# Patient Record
Sex: Female | Born: 1950 | Race: White | Hispanic: No | Smoking: Never smoker
Health system: Southern US, Community
[De-identification: ages and names within clinical notes are randomized; demographics above are authoritative.]

## PROBLEM LIST (undated history)

## (undated) DIAGNOSIS — Z9889 Other specified postprocedural states: Secondary | ICD-10-CM

## (undated) DIAGNOSIS — K5730 Diverticulosis of large intestine without perforation or abscess without bleeding: Secondary | ICD-10-CM

## (undated) DIAGNOSIS — I10 Essential (primary) hypertension: Secondary | ICD-10-CM

## (undated) DIAGNOSIS — G709 Myoneural disorder, unspecified: Secondary | ICD-10-CM

## (undated) DIAGNOSIS — R112 Nausea with vomiting, unspecified: Secondary | ICD-10-CM

## (undated) DIAGNOSIS — C229 Malignant neoplasm of liver, not specified as primary or secondary: Secondary | ICD-10-CM

## (undated) DIAGNOSIS — Z923 Personal history of irradiation: Secondary | ICD-10-CM

## (undated) DIAGNOSIS — E039 Hypothyroidism, unspecified: Secondary | ICD-10-CM

## (undated) DIAGNOSIS — C221 Intrahepatic bile duct carcinoma: Secondary | ICD-10-CM

## (undated) DIAGNOSIS — Z8489 Family history of other specified conditions: Secondary | ICD-10-CM

## (undated) DIAGNOSIS — E785 Hyperlipidemia, unspecified: Secondary | ICD-10-CM

## (undated) DIAGNOSIS — M1990 Unspecified osteoarthritis, unspecified site: Secondary | ICD-10-CM

## (undated) HISTORY — PX: CERVICAL FUSION: SHX112

## (undated) HISTORY — PX: EYE SURGERY: SHX253

## (undated) HISTORY — DX: Intrahepatic bile duct carcinoma: C22.1

## (undated) HISTORY — PX: OTHER SURGICAL HISTORY: SHX169

## (undated) HISTORY — DX: Hyperlipidemia, unspecified: E78.5

## (undated) HISTORY — PX: BACK SURGERY: SHX140

---

## 1977-05-02 HISTORY — PX: TUBAL LIGATION: SHX77

## 1997-12-22 ENCOUNTER — Ambulatory Visit (HOSPITAL_COMMUNITY): Admission: RE | Admit: 1997-12-22 | Discharge: 1997-12-22 | Payer: Self-pay | Admitting: *Deleted

## 2001-05-15 ENCOUNTER — Encounter: Admission: RE | Admit: 2001-05-15 | Discharge: 2001-05-22 | Payer: Self-pay | Admitting: Specialist

## 2001-10-08 ENCOUNTER — Encounter: Payer: Self-pay | Admitting: Specialist

## 2001-10-17 ENCOUNTER — Encounter: Payer: Self-pay | Admitting: Specialist

## 2001-10-17 ENCOUNTER — Inpatient Hospital Stay (HOSPITAL_COMMUNITY): Admission: RE | Admit: 2001-10-17 | Discharge: 2001-10-19 | Payer: Self-pay | Admitting: Specialist

## 2006-08-08 ENCOUNTER — Ambulatory Visit (HOSPITAL_COMMUNITY): Admission: RE | Admit: 2006-08-08 | Discharge: 2006-08-09 | Payer: Self-pay | Admitting: Orthopedic Surgery

## 2006-08-28 ENCOUNTER — Encounter: Admission: RE | Admit: 2006-08-28 | Discharge: 2006-09-27 | Payer: Self-pay | Admitting: Orthopedic Surgery

## 2006-12-14 ENCOUNTER — Ambulatory Visit: Payer: Self-pay | Admitting: Internal Medicine

## 2006-12-28 ENCOUNTER — Ambulatory Visit: Payer: Self-pay | Admitting: Internal Medicine

## 2008-03-13 ENCOUNTER — Inpatient Hospital Stay (HOSPITAL_COMMUNITY): Admission: RE | Admit: 2008-03-13 | Discharge: 2008-03-17 | Payer: Self-pay | Admitting: Specialist

## 2009-05-02 HISTORY — PX: BACK SURGERY: SHX140

## 2010-09-14 NOTE — Op Note (Signed)
NAMEKAMESHA, HERNE             ACCOUNT NO.:  [id number]   MEDICAL RECORD NO.:  [id number]          PATIENT TYPE:  INP   LOCATION:  5035                         FACILITY:  [facility]   PHYSICIAN:  Kerrin Champagne, M.D.   DATE OF BIRTH:  [birth year]   DATE OF PROCEDURE:  03/13/2008  DATE OF DISCHARGE:                               OPERATIVE REPORT   PREOPERATIVE DIAGNOSES:  1. Lumbar spinal stenosis, L2-3, L3-4, and L4-5.  2. Degenerative spondylolisthesis, L4-5, grade I.   POSTOPERATIVE DIAGNOSES:  1. Lumbar spinal stenosis, L2-3, L3-4, and L4-5.  2. Degenerative spondylolisthesis, L4-5, grade I.   PROCEDURES:  1. Central decompressive laminectomy, L2-3, L3-4, and L4-5 with      transforaminal lumbar interbody fusion, right L4-5 and this was      done using a 12 mm DePuy Concorde Lordotic CAGE with local bone      graft.  2. Posterolateral fusion, L4-5 with L4 and L5 pedicle screws and rods,      Monarch DePuy type with local bone graft, posterolateral and within      the intervertebral disk space.  3. Repair of dural tear, right L4-5.   SURGEON:  Kerrin Champagne, M.D.   ASSISTANT:  Wende Neighbors, P.A.-C.   ANESTHESIA:  General via orotracheal intubation, Dr. Krista Blue.   ESTIMATED BLOOD LOSS:  400 mL.   DRAINS:  Foley to straight drain, subcu drain, right lumbar spine.   COMPLICATIONS:  Right L4-5 dural tear occurred with slip of awl off the  inferior aspect of the superior articular process at L5.  This was  repaired using five 4-0 Nurolon suture and Tisseel.   FINDINGS:  Severe lumbar spinal stenosis, L3-4, moderately severe L2-3  and L4-5 with spondylolisthesis, grade I at L4-5.  Dural tear, right L4-  5, occurred during procedure.  The patient returned to the PACU in good  condition.   HISTORY OF PRESENT ILLNESS:  A 60 year old female who has been followed  for a long time for prominence related to her cervical spine and lumbar  spine, and has undergone previous  anterior cervical discectomy with  fusion at two levels some 7 or 8 years ago and has done well.  Following  her surgery of her neck, has had progressive and worsening neurogenic  claudication with difficulty standing and ambulating any distance.  She  underwent extensive evaluation, and has had preoperative epidural  steroids over several years.  Over the last 1-2 years, has seen  progressive decline in her distance of walking and standing.  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  This is degenerative type, grade I at the  L4-5 level.  Degenerative disk disease at L5-S1.  Intraoperative  findings as above.   COMPLICATIONS:  The patient had a dural tear, right side, L4-5 when an  awl slipped from the inferior aspect of the superior articular process  of L5 causing a durotomy on the right side at the L4-5; although, this  was repaired using multiple Nurolon sutures and Tisseel with water-seal  obtained to 30 millimeters of pressure.   DESCRIPTION OF PROCEDURE:  After adequate general anesthesia, the  patient had a Foley catheter placed, standard preoperative antibiotics,  and turned to a prone position.  The [facility] spine table was used.  Standard prep with DuraPrep solution.  All pressure points were all  padded.  Neuromonitor leads over the lower extremity placed  preoperatively.  Following prep and drape, the patient was draped in  usual manner.  Iodine Vi-drape was used.   The incision at the expected L4 extended up to the L2 level.  Through  the skin and subcutaneous layers palpating the level of the iliac crest,  this is the lowest extent incision, extended superiorly to approximately  3.5 to 4 inches through the skin and subcutaneous layers.  After  infiltration, Marcaine 0.5% in 1:200,000 epinephrine, it was carried  down to the lumbodorsal  fascia at the midline.  Incision were then made  on both sides of the spinous process of L2, and over the central portion  of the supraspinatus ligament of L2, L3, L4, L5, and S1.  Cobb was then  used to carefully elevate the paralumbar muscles bilaterally incising  muscles off the posterior elements both sides, preserving the facet  capsule at L2-3, L3-4, and at L4-5 as well.   Initial cerebellar retractors were used.  Clamps were placed on the  inspected spinous process of L4 superiorly at the spinous process at L2.  Intraoperative lateral radiograph demonstrated the clamps at the L2 and  the L4 level.  These were marked for continued identification.  Continued exposure was obtained distally to the lamina of L5 at the  posterior aspect of the spinous process of L5 as well.  The exposure was  carried out over the facet at the L4-5 level to the transverse process  identified.  The dissection was carried out over the lateral aspect of  the facet at the L4-5 level exposing the L5 transverse process, but also  at the L3-4 level exposing the transverse process of L4 at this level  bilaterally.  Bleeders was controlled using monopolar and bipolar  electrocautery.  The lateral gutters was then packed with packing  material.  Extended rongeur used to remove the spinous process over the  inferior aspect of L2 at the interspinous ligament at L2-3, spinous  process of L3, spinous process of L4, and the interspinous ligament at  L5.   The posterior aspect of the lamina of L4 and L3 were carefully thinned,  and a small portion of the inferior aspect of the lamina of L2 was  resected bilaterally in order to expose the attachment of the ligamentum  flavum, over the volar aspect of the surface over the inferior aspect of  the lamina of L2 bilaterally.  After this, osteotomes were then used to  resect the bone removing the bone involving the inferior articular  process of L4 bilaterally, and then boned  from the central portion into  the canal at L4 and then at L3 performing partial medial facetectomy,  this took about 15-20% of the joint using osteotomes and saving this  bone for later bone grafting purposes.  A central laminectomy was then  carried out after thinning of the central portions of the bone using  Lexxel rongeur as well as osteotomes, and a 3 mm Kerrison able to be  introduced underneath the lamina of L3, resecting this bone upwards to  the neural arch where it was further resected.  Ligamentum flavum at the  L2-3 level was then resected bilaterally using 3 and 4 mm Kerrison's.   The medial ligament at the L3-4 level was resected bilaterally after  first performing partial facetectomy with the overgrown inferior  articular process at L3, and then partial facetectomy in the medial  aspect of the superior articular process of L4 bilaterally decompressing  lateral recess and allowed for decompression of the L3 nerve root.  Then, the neural foramen on both sides and this was then decompressed,  first exposing the nerve superiorly at the level of pedicle following it  into the neural foramen and removing at the reflected ligamentum flavum  over the L3-4 facet.  There was some pressing upon the L3 nerve root  bilaterally.  The L4 nerve root was tracking out.  This neural foramen  was decompressed on the left side by performing a similar resection at  the medial portion of the facet at L4-5, and resecting the superior  articular process at L5 on the left side impinging on the L4 nerve root.   The L5 nerve root foraminotomy was performed on the left side.  Partial  portion of the superior aspect of the lamina of L5 was resected, and  then foraminotomy performed on the right side at L5.  The medial aspect  of the superior articular process of L5 was resected using osteotome on  the right side.  Osteotomes were then used to resect the entire inferior  articular process on the right  side at L4, allowing for exposure on the  right L4 neural foramen and then osteotomizing the superior articular  process of L5 on the right side at the level of the superior aspect of  the pedicle here.  A 3 mm Kerrison was then used to decompress the  neural foramen removing any bone.  This was then carefully prepped  removing all soft tissue to be used for local bone grafting purposes.   The central laminectomy was completed.  An awl was then placed at the  intersection of the transverse process on the left side of L4 with the  superior articular process of L4 and lateral aspect of the pedicle here.  A small entry point was made.  Intraoperative C-arm fluoroscopy, AP, and  then lateral view demonstrated the awl in the correct positional  alignment such that the awl was removed, and a hand pedicle finder used  to probe the pedicle at a depth of 40 mm.  A 6.25 screw was chosen,  high-speed bur than use to care for the decorticated transverse process,  and local bone graft was then placed over the transverse process of the  pedicle opening, tapped to a 5.5 tap, and ball tip probe used to probe  the channel for patency and a 6.25 x 14 mm screw placed on the left at  the L4 level.   Similarly, same size screw was placed on the left side at L5 again using  a awl to perform at the entry point at the expected intersection of the  superior articular process at L5, the lateral aspect of the pedicle at  L5, and the intersection with the transverse processes over the inferior  aspect.  An awl was used to make the initial entry point, and then a  hand-held pedicle finder used to probe the pedicle.  A ball tip probe  was used to ensure its patency and no sign of cortical  penetration.  Tapping was performed with a 5.5 tap and again rechecked with a ball tip  probe.  A 40 mm x 6.25 screw was placed after decorticating the  transverse process of L5 placing local bone graft.   Attention turned to the  right side where a pedicle screw at the L4 level  was placed by using an awl to make an entry point as noted previously at  the intersection of the superior articular process of L4 over the  transverse process of L4 over the lateral aspect here to confirm those.  The awl was then removed, and a hand-held pedicle finder used to probe  the pedicle to a depth of 40 mm.  A 6.25 screw x 40 mm was chosen and  tapping with the 5.5 assessing the cortical bone within the probe  pedicle, assured that it was not broached, this was the case  decorticating the transverse process of lower L4, and then placing the  40 mm screw in the right side.  This was completed.   The attention was then turned to the L5 level on the right side where an  awl was used to make an entry point at the inferior aspect of the  superior articular process of L5 at its intersection at the lateral  aspect of the pedicle of L5 and the transverse process of L5.  The  initial entry point appeared to be low, so that entry point was  attempted higher up while placing the awl and attempting to make hole  for initial placement of the pedicle finder.  The awl slipped off the  superior articular process causing durotomy on the right side at the L4-  5 level.  Neural elements were exposed.  They appear to be intact.   Attention was turned immediately to this side.  Pledgets and conduits  were immediately placed right over the area of the neural elements  protecting it in situ.  A 4-0 Nurolon suture was placed at the superior  extent of the durotomy, and this was placed under tension to help reduce  neural elements.  This was dealt without difficulty.  Once the neural  elements have been reduced and the laminotomy appeared to be  approximately 3-4 mm in length, a 4-0 Nurolon sutures were then used to  perform interrupted closure using loup magnification headlight for it.  Care was taken to assure that the neural elements were not  involved with  suturing technique.   Once the suturing technique was carried, the Valsalva was performed to  30 mm.  There was no leakage present.  Tisseel was then applied at the  end of this case to this area.  Attention was then returned to place the  pedicle screw on the right side at the L5 level.  An awl was used to  make the entry point again, and then a hand-held pedicle probe used to  probe this pedicle to 40 mm.  This was observed on C-arm fluoro to be in  good position alignment tap with a 5.5 tap, and the appropriate 40 mm x  6.25. A screw was placed after decortication of the transverse process,  and placement of local bone graft at the level of the tranverse process  of L5 continuing it up to the L4 transverse process.  With this then,  testing was carried out using soft tissue resistance and neuromonitoring  of all pedicle screws.  They measured 33 and better at the L4  bilaterally,  L5 in the left, and L5 in the right measured 28.  It was  felt to be perfectly acceptable.  Care was taken to examine the pedicles  themselves and they demonstrated no sign of broaching the cortex of L5  pedicle screws at each level.   Following this then, irrigation was carried out.  Dural sac at the L4-5  level was carefully retracted medially at the L4 nerve root on the right  side and retracted superiorly.  Epidural veins were cauterized using  bipolar electrocautery.  Incision was made into the disk on the right  side of the L4-5.  Using a 15-blade scalpel, pituitary rongeur was used  to debride the disk of any degenerative disk material.  Dilation of the  disk space was carried out to a #12 dilator.  Disk space was then  additionally debrided using curettage, using the straight curette  upwards and downwards, and then up and right binding curette up from the  left binding curette.  Pituitary rongeur was used to debris the disk  space of any degenerative disk material present, and this  was near  completed.  Endplates were then debrided off any cartilaginous endplate  material using ring curettes as well as curettes provided, and then  debrided using pituitary rongeur.   Once this was completed, trial placement of cages were carried out using  the 9 through the 12 mm.  A 12 mm was felt to give the best fit.  The decision was made to use the 23 mm length lordotic Concorde cage at this  point.  Bone graft was packed within the intervertebral disk space.  It  was noted that the endplates were pretty hard cortical bone surfaces.  After packing bone graft within the intercerebral disk space, dilation  of the disk space was carried out using a trial #9 and #10 trial.  The  lordotic DePuy Concorde cage was then packed with local bone graft.  This was then placed in approximately 30-35 degrees of convergence and  impacted into place at a subsetting beneath the posterior aspect of the  disk space about 3-4 mm.  This was completed.  Then, an intraoperative C-  arm fluoro demonstrated excellent positional alignment of the cage  within the intervertebral disk space.  The inserting device was then  removed.  Irrigation carried out.  Small bleeders controlled using  bipolar electrocautery.   Next, a rod on the right side at the L4 level was attached to the  pedicle and its cap placed.  The cap was tied into 80 feet pounds.  Compression obtained then over the area of the inserted rod fastener  site and this was then compressed between the nerves at L4 and L5, and  the cap at L5 on the right side was then tied to 80 foot pounds.  Similarly on the left side at the L4 level, the cap was tied into 80  foot pounds tightening the rod to the L4 fastener.  The rod then was  placed under compression between the L4 and L5 fasteners, and the cap at  the L5 level was then tied to 80 foot pounds.  This was completed.  An  intraoperative C-arm image has demonstrated excellent positional   alignment of the screws and rods, and TLIF at the L4-L5 level.   Irrigation was then further carried out.  Careful inspection at the  neural foraminotomies at L3-4, L4-5, and L2-3 have felt to be widely  patent.  The initial  amount of removal of some of the undersurface at  the L4-5 was carried out to ensure the complete patency of the left L4  neural foramen probing the L5 neural foramen, it appeared to be well  decompressed both sides as well.  With permanent C-arm images then,  careful irrigation with bone wax was applied to the bleeding cancellous  surfaces of the open laminotomy sites centrally.  The area of durotomy  was repaired on the right side, then carefully irrigated, and Tisseel  was then applied first within the depth of the right subannular recess  over the area of the posterior laminotomy area, and placement of the  TLIF this in order to control some amount of bleeding, drying, and over  the right posterior aspect of the thecal sac in the area of durotomy in  order to seal this area as well, additional placed over the left side  within the lateral recess at L4-5 and L3-4 and this was dried then again  Valsalva was performed.  There was no active leak noted.  After this was  done then, the  VIPER retractor was removed.  Bleeders were controlled  using bipolar electrocautery.  Any devitalized tissue was extracted.  A  #1 Vicryl sutures were used to approximate the paralumbar muscles in the  midline opening up the laminectomy site loosening with #1 Vicryl  sutures.  Lumbodorsal fascia was reattached at the L2 spinous processes,  and the L5-S1 level as well using interrupted figure-of-eight simple  sutures with #1 Vicryl.  Lumbodorsal fascia was then attached to itself  over the laminectomy area using interrupted simple sutures of #1 Vicryl.  Deep subcutaneous layer was approximated with interrupted 0 Vicryl  sutures.  A Hemovac drain was placed along the posterior aspect of  the  lumbodorsal fascia exiting over the right lower lumbar spine, and subcu  layers were closed over this drain using interrupted 0 and 2-0 Vicryl  sutures.  Skin was closed with a running subcu stitch of 4-0 Vicryl, and  the patient then Dermabond applied.   A 4 x 4s and ABD pad fixed to the skin with Hypafix type.  The patient  noted intraoperatively to have no changes on neuromonitoring equipment.  All of the soft tissue resistant figures appeared to be correct for  showing no sign of penetration by the pedicle screws.  The patient was  then returned to its supine position, reactivated and extubated, and  returned to the recovery room in satisfactory condition.  All instrument  and sponge counts were correct.      Kerrin Champagne, M.D.  Electronically Signed     JEN/MEDQ  D:  03/13/2008  T:  03/14/2008  Job:  [id number]

## 2010-09-17 NOTE — Discharge Summary (Signed)
Lindsay Aguirre, Lindsay Aguirre             ACCOUNT NO.:  [id number]   MEDICAL RECORD NO.:  [id number]          PATIENT TYPE:  INP   LOCATION:  5035                         FACILITY:  [facility]   PHYSICIAN:  Kerrin Champagne, M.D.   DATE OF BIRTH:  1951-04-16   DATE OF ADMISSION:  03/13/2008  DATE OF DISCHARGE:  03/17/2008                               DISCHARGE SUMMARY   ADMISSION DIAGNOSES:  1. Lumbar spinal stenosis, L2-L3, L3-L4, and L4-L5.  2. Degenerative spondylolisthesis, grade 1 at L4-L5.  3. Hypertension.  4. Hypothyroidism.  5. Status post anterior cervical diskectomy and fusion.   DISCHARGE DIAGNOSES:  1. Lumbar spinal stenosis, L2-L3, L3-L4, and L4-L5.  2. Degenerative spondylolisthesis, grade 1 at L4-L5.  3. Hypertension.  4. Hypothyroidism.  5. Status post anterior cervical diskectomy and fusion.  6. Intraoperative right L4-L5 dural tear, treated with surgical repair      including suture and Tisseel.  7. Acute blood loss anemia, requiring blood transfusion.   PROCEDURES:  On March 13, 2008 the patient underwent:  1. Central decompressive laminectomy, L2-L3, L3-L4, and L4-L5 with      transforaminal lumbar interbody fusion, right L4-L5 with local bone      graft.  2. Posterolateral fusion, L4-L5 with L4-L5 pedicle screws and rods.      Local bone graft.  3. Repair of dural tear right, L4-L5.  This was performed by Dr. Otelia Sergeant      and assisted by Maud Deed, PA-C, under general anesthesia.   CONSULTATIONS:  None.   BRIEF HISTORY:  Lindsay Aguirre is a 60 year old female with progressive  neurogenic claudication including difficulty standing and ambulating  distances.  She has undergone extensive evaluation with findings of  severe lumbar spinal stenosis at L3-L4, moderately severe L2-L3 and L4-  L5 with spondylolisthesis, grade 1 at L4-L5.  She has failed  conservative treatment including epidural steroid injections.  It was  felt that she would benefit from surgical  intervention and was admitted  for the procedure as stated above.   BRIEF HOSPITAL COURSE:  The patient tolerated the procedure under  general anesthesia without complications.  The patient did obtain a  dural tear intraoperatively, however, this did not hold physical therapy  and she was not debilitated in anyway from this.  No headaches, nausea,  or vomiting.  She suffered with acute blood loss anemia, which required  transfusion and she received 2 units of packed red blood cells.  Post-  transfusion, hemoglobin 11.1 and hematocrit 32.2.  At discharge,  hemoglobin 9.6 and hematocrit 27.5.  Neurovascular motor function of the  lower extremities was intact throughout the hospital stay.  The patient  did have complaints of burning and pain of the left thigh.  She was  started on amitriptyline at bedtime.  She was also continued on her  Neurontin.  The Foley catheter was discontinued and the patient was able  to void.  Initially, she had hypoactive bowel sounds and her diet was  held until bowel function returned and she was placed on a regular diet.  She was afebrile, vital signs were stable throughout  the hospital stay.  Hemovac drain discontinued on the first postoperative day and wound was  checked daily thereafter.  Wound was healing without drainage, erythema,  or edema.  The patient was fitted with an Aspen LSO, which initially was  incorrect and refitted by [employer].  She utilized the LSO when she was  out of bed with physical therapy.  She was able to ambulate as much as  85 feet prior to discharge.  She was seen by Occupational Therapy for  ADLs and was independent with ADLs at the time of discharge.  On  March 17, 2008, the patient was felt stable for discharge to her  home.   OTHER PERTINENT LABORATORY VALUES:  Coagulation studies on admission  were within normal limits.  Chemistry studies on admission were normal  and repeat postoperatively within normal limits as well.   Urinalysis on  admission negative for urinary tract infection.  EKG on admission showed  normal sinus rhythm, T-wave abnormality consider inferior ischemia, no  significant change since last tracing, confirmed by Dr. Verdis Prime.   PLAN:  The patient was discharged to her home.  She will continue to  wear her brace at all times when she is out of bed.  She will ambulate  with a walker.  The patient will change her dressing as needed and will  be low allowed to shower with an occlusive dressing.  She will follow up  with Dr. Otelia Sergeant in 2 weeks.  She will call to arrange the appointment.  She was given medication reconciliation form with instructions to  continue all of her medications with exception of tramadol, hydrocodone,  and diclofenac.  Prescriptions given at discharge include Elavil 25 mg  at bedtime, Robaxin 500 mg one every 8 hours as needed for spasm,  Vicodin 1-2 every 4-6 hours as needed for pain, and Neurontin 300 mg at  bedtime.  The patient will continue to use over-the-counter stool  softeners or laxative of choice as needed.  She is advised to call the  office should she have questions or concerns prior to her return office  visit.   CONDITION ON DISCHARGE:  Stable.      Wende Neighbors, P.A.      Kerrin Champagne, M.D.  Electronically Signed    SMV/MEDQ  D:  04/17/2008  T:  04/17/2008  Job:  [id number]

## 2010-09-17 NOTE — H&P (Signed)
[facility]  Patient:    Lindsay Aguirre, Lindsay Aguirre Visit Number: [id number] MRN: [id number]          Service Type: Attending:  Kerrin Champagne, M.D. Dictated by:   Dorie Rank, P.A. Adm. Date:  10/17/01   CC:         [facility]   History and Physical  DATE OF BIRTH:  May 28, 2050  CHIEF COMPLAINT:  Neck pain.  HISTORY OF PRESENT ILLNESS:  The patient is a pleasant 60 year old female who has a long history of neck pain.  She underwent surgery to her cervical spine in approximately 1998.  She had an MRI on April 02, 2001, which revealed a stenosis at C5-6.  She has had continued numbness in the C6 distribution effecting here radial three fingers.  She had failed conservative treatment, including activity modification and NSAIDs.  She is at a point where the pain is interfering with her activities of daily living.  On physical exam it was noted that reflexes were normoreflexic.  Hoffmanns sign was negative.  She had numbness and tingling in the C6 distribution.  Radiographs demonstrated disk narrowing at C5-6.  The fusion was noted to be solid at C6-7.  She had an MRI dating back to December of 2002 which revealed severe stenosis at C5-6 with the AP diameter at the spinal canal at 6 mm and at C4-5 at 9 mm.  She had foraminal stenosis at C4-5 with some slight overlying protruding disk material.  It was felt that she would benefit from undergoing anterior cervical diskectomy and fusion of C5-6 with right iliac crest bone grafting with Synthes plate and screws.  The risks, benefits, and procedure were discussed with the patient and she elected to proceed.  ALLERGIES:  No known drug allergies.  MEDICATIONS: 1. Synthroid 0.88 mg q.d. 2. Celexa 20 mg q.d. 3. Activella 1.5 mg q.d. 4. Neurontin 100 mg one p.o. t.i.d. 5. Vitamin E 1000 iu q.d. 6. Glucosamine 750 mg q.d. 7. Chondroitin sulfate 600 mg q.d. 8. Vitamin C 500 mg q.d.  PAST  MEDICAL HISTORY:  Significant for hypothyroidism and osteoarthritis as discussed above.  Otherwise in relatively good health.  SOCIAL HISTORY:  The patient is married.  She denies any alcohol or tobacco use.  She has a daughter who lives near her that will be available for care afterwards.  She has three children.  She lives in a one-level home.  PAST SURGICAL HISTORY: 1. Tubal ligation in 1979. 2. Fusion of C6-7 in 1998.  FAMILY HISTORY:  Father deceased at age 52 with a history of heart disease and CVA.  Mother deceased at age 54 with a history of arthritis and hypertension.  REVIEW OF SYSTEMS:  General:  No fever, chills, night sweats, or bleeding tendencies.  Pulmonary:  No shortness of breath, productive, or hemoptysis. Cardiovascular:  No chest pain, angina, or orthopnea.  Endocrine:  Significant for hypothyroidism.  No history of diabetes mellitus.  Neurologic:  No seizures, headaches, or paralysis.  GI:  No nausea, vomiting, diarrhea, melena, or constipation.  GU:  No hematuria, dysuria, or discharge.  PHYSICAL EXAMINATION:  GENERAL APPEARANCE:  An alert, oriented, well-developed, well-nourished, pleasant, 60 year old female.  VITAL SIGNS:  Pulse 80 and regular, respirations 16, blood pressure 140/90.  HEENT:  The head is normocephalic and atraumatic.  The oropharynx is clear.  NECK:  Supple.  Negative for cervical lymphadenopathy.  Negative for carotid bruits bilaterally.  CHEST:  Clear to auscultation bilaterally.  No  wheezes, rales, or rhonchi.  BREASTS:  Not pertinent to the present illness.  HEART:  S1 and S2 negative for murmur or gallop.  Regular rate and rhythm.  ABDOMEN:  Soft and nontender.  Positive bowel sounds.  GENITOURINARY:  Not pertinent to present illness.  EXTREMITIES:  Please see the history of present illness for the physical exam to the neck with cervical spinal exam as well.  SKIN:  Intact.  No rashes or lesions appreciated on  exam.  LABORATORY DATA AND X-RAYS:  Preoperative labs and x-rays are pending.  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  PLAN:  Anterior cervical discectomy and fusion of C5-6 with right iliac crest bone grafting with Kerrin Champagne, M.D., on October 17, 2001. Dictated by:   Dorie Rank, P.A. Attending:  Kerrin Champagne, M.D. DD:  10/09/01 TD:  10/11/01 Job: [id number]

## 2010-09-17 NOTE — Op Note (Signed)
[facility]  Patient:    Lindsay Aguirre, Lindsay Aguirre Visit Number: [id number] MRN: [id number]          Service Type: SUR Location: [id number] Attending Physician:  Lubertha South Dictated by:   Kerrin Champagne, M.D. Proc. Date: 10/17/01 Admit Date:  10/17/2001                             Operative Report  PREOPERATIVE DIAGNOSES:  Cervical stenosis at the C5-6 level with left-sided foraminal stenosis and herniated nucleus pulposus above previous surgical fusion site at the C6-7 level.  POSTOPERATIVE DIAGNOSES:  Cervical stenosis at the C5-6 level with left-sided foraminal stenosis and herniated nucleus pulposus above previous surgical fusion site at the C6-7 level.  PROCEDURE:  Anterior cervical diskectomy at the C5-6 level with excision of osteophytes posteriorly, disk protrusion, and left-sided foraminotomy C6.  The patient had cervical fusion performed using right iliac crest bone graft harvested through a separate incision, internal fixation using a Synthes 27 mm locking plate with 16 mm screws.  Microscope was used during this procedure.  SURGEON:  Kerrin Champagne, M.D.  ASSISTANT:  Patricia Nettle, M.D.  ANESTHESIA:  GOT, Dr. Shireen Quan, supplemented with local infiltration of the right iliac crest and left neck using approximately 15 cc of Marcaine 0.5% with 1:200,000 epinephrine.  DRAINS:  TLS drain, left neck.   Foley to straight drain.  BRIEF CLINICAL HISTORY:  The patient is a 60 year old female, who has had an approximately one year history of neck pain and radiation to the left arm, numbness in the left C6 distribution.  She has had previous anterior diskectomy and fusion surgery in 1998 at the C6-7 level for a large disk protrusion with resulting weakness in her upper extremity.  Following surgery, she did extremely well.  She presented over a year ago with increasing neck pain and radiation to the left arm, pain with extension of the neck,  radiation in the left radial three digits.  EMGs and nerve conduction studies confirmed cervical cause of pain.  No sign of carpal tunnel syndrome.  The MRI study demonstrated cervical stenosis with the cervical canal measuring approximately 6-8 mm at the C5-6 level with foraminal stenosis entrapment involving the left C6 nerve root.  The patient was felt to have disk protrusion central and leftward.  INTRAOPERATIVE FINDINGS:  The patient was found to have cervical stenosis, bilateral foraminal stenosis, left side greater than right with a very small disk protrusion on the left side compromising her foraminal stenosis and increasing compression of the left C6 nerve root.  Following decompression of nerve roots, the cord had normal appearance.  DESCRIPTION OF PROCEDURE:  After adequate general anesthesia, the patients hand on a [place] horseshoe, well-padded, neck in slight extension with 5 pound cervical halter traction.  No bolster at the level of the shoulders. Bolster under the right buttock to elevate the right iliac crest for bone graft harvest purposes.  Standard preoperative antibiotics.  Prep over the anterior and left neck with DuraPrep solution over the right iliac crest. Draped in the usual manner, iodine Vi-Drape was used over the right iliac crest and anterior neck.  Then 18 gauge needles were placed on the skin over the lateral aspect of the neck to help identify level for incision on the skin.  Previous old incision scar was used following lateral radiograph which demonstrated the needles placed, approximating the C5-6 level on lateral  view. The incision made through the old incision scar in line with the patients skin creases along the left anterior neck, approximately 5.5 cm in length through the skin and subcu layers, down to the superficial fascia layer of the platysma muscle.  An incision was then made transversely in line with the skin incision through the platysma  layer and platysma muscle to the deeper fascial layer of the platysma.  Metzenbaum scissors and pickups were then used to carefully dissect the subcu layers, developing the interval between the trachea and esophagus medially and the carotid sheath laterally.  The sternohyoid muscle was identified, and this was preventing retraction of the medial structures, so it was divided using electrocautery.  Trachea and esophagus then were more easily mobilized medially.  Blunt dissection then used to carefully extend the interval between the trachea and esophagus and the carotid sheath down to the prevertebral fascia.  Electrocautery then used to cauterize the medial border of the longus colli muscle using bipolar electrocautery.  They Key elevator as well as Kitner were then used to tease the prevertebral fascia across the midline, identifying the previously fused C6-7 level inferiorly and the expected C5-6 level due to anterior osteophytes. Spinal needle with the sheath intact only allowing a centimeter of the needle to be inserted was placed at the C5-6 level, and lateral radiograph of the cervical spine demonstrated this level to be C5-6.  Under direct observation with hand-held lordosis in place then the spinal needle was removed and a small portion of the anterior aspect of the disk excised using a 15 blade scalpel.  Pituitary rongeurs then used to excise this fragment for a continued identification of this level throughout the remainder of the procedure.  The trachea and esophagus were further mobilized to the right side, and the longus colli muscle identified in the midline.  These were freed up using Key elevator, cauterized along their border using bipolar and monopolar electrocautery.  A McCullough retractor was then inserted, the foot of the blade beneath the medial border of the longus colli muscle on both sides, exposing the C5-6 level anteriorly.  A 15 blade scalpel was used to  further incise the anterior annular fibers, excising a window anterior annulus.  A 3 mm and 2 mm Kerrisons used to excise the anterior lip osteophytes over the  inferior aspect of the C5 and superior aspect of the C6, further opening the disk space to allow for exposure.  Pituitary rongeurs and curettes were then used to debride the disk space of degenerated disk material over the anterior two-thirds of the disk.  Then 16 mm screw posts from the Synthes set were then inserted and distraction obtained across the intervertebral disk space.  This allowed for further exposure in the intervertebral disk space.  The end plates, which were cartilaginous, were then excised from the inferior aspect of C5 and superior aspect of C6 back to the posterior annular fibers and the remaining disk material excised back to the posterior annular fibers using pituitary rongeurs as well as curettes.  The operating room microscope was draped and brought into the field under direct visualization.  The posterior annular fibers were then excised to the right side.  On excision, the left side disk material was noted to be extending subannular into the subligamentous region, and this was removed using a titanium nerve hook and small micropituitary rongeurs.  Then 1 mm and 2 mm Kerrisons were able to be introduced over the posterior aspect of the posterior  lip osteophytes, resecting the posterosuperior lip osteophytes of C6 and the posteroinferior osteophytes of C5, further decompressing the spinal canal.  Posterolateral ligament was then resected after foraminotomy on the left side, decompressing the left C6 nerve root.  Posterior longitudinal ligament identified on the left side and then resection carried from the left side to the right side, decompressing the spinal canal and exposing the patients thecal sacs ventrally.  It was felt that there was no further disk material within the spinal canal or extending into  the left neural foramen.  A nerve hook could be easily passed out the left neural foramen.  Decompression of the right foramen was also carried out using 1 mm and 2 mm Kerrisons, excising up the vertebral joint posterolaterally until the nerve root on the right side was found to be completely free as well.  Following this, irrigation was performed. High-speed bur then used to carefully further decorticate the cartilaginous end plates over the inferior aspect of C5 and superior aspect of C6.  Height of the intervertebral disk space was measured using spacers, provided in the Synthes set, measured 8 mm.  The depth measured using Cloward depth gauge. This measured out at 23 mm.  Right iliac crest bone graft was obtained through a separate incision.  Incision approximately 4 cm in length through the skin and subcu layers over the right anterior and lateral iliac crest that had been prepped previously.  The incision carried sharply through the skin and subcu layers after infiltration with Marcaine 0.5% plain.  The incision was carried sharply down to the iliac crest and the interval between the fascial layers through the abdominal muscles and the fascial layers of the proximal thigh. This was incised in the interval using electrocautery and then Cobbs used to elevate the periosteum, both medial and lateral, exposing the iliac crest lateral.  A hand-held Cloward was inserted over the medial aspect of the iliac crest and a Cobb used laterally.  Dual oscillating saw set at 8 mm was used to make the additional scribe onto the iliac crest, and then a single saw blade was used to divide the iliac crest using parallel cuts in the iliac crest.  An osteotome was then used to divide the base of this graft. The depth measured approximately 15 mm.  Height 8 mm.  Soft tissue was debrided from the tricoracoid iliac crest bone graft that was harvested and carefully, this was tapered to the dimensions of the  intervertebral disk space at the C5-6 level. Careful inspection of the disk space noted there was no further soft tissue remaining that could be retropulsed with insertion of this graft.  Irrigation was performed.  No active bleeding was evident within the disk space.  The bone graft was then placed and packed into place subsetting the graft approximately 1-2 mm.  Distraction system was removed including the screw posts.   Bleeding from the screw post holes controlled using bone wax.  The anterior aspect of the disk space was then carefully debrided to soft tissue using electrocautery and high-speed bur, smoothing appropriately to allow fro confirmation of the cervical plate to the anterior aspect of the cervical spine at this level.  The distance between the expected screw holes was measured to be about 18 mm.  A 27 mm plate was chosen.  The 5 pound cervical halter traction was removed from the patients neck, and the Synthes locking plate then carefully placed against the anterior aspect of the cervical spine, held in  place with a single temporary fixation pin over the right inferior plate.  The superior plate holes were then examined, the microscope removed, and the plate examined and found to be in excellent position alignment in the AP plane and the immediate lateral plane as well.  The first screw was placed in the right upper hole of the plate, using a 16 mm drill, and then then the 16 mm screw was placed.  The left side screw was then similarly placed, and the left lower screw was placed, fixation pin removed, and the right side pin drilled and then screwed in place.  Locking screws were then placed. Intraoperative lateral radiograph demonstrated placement of screws fixing the bone graft at the C5-6 level with excellent position and alignment.  Excellent reestablishment at the height of this disk space was noted.  No evidence of retropulsion of bone graft, and screw length appeared to  be adequate without any evidence of impingement on the canal.  Irrigation was then performed over the anterior cervical diskectomy site and over the right iliac crest site. Careful inspection of soft tissues demonstrated no abnormalities.  The esophagus had a normal appearance.  Small bleeders were controlled using bipolar electrocautery.  A single thyroid artery was observed to be without abnormality.   Irrigation was further performed in the neck, and a 7 mm TLS drain placed through a separate incision and extending out the inferior aspect of the incision.  This was sewn into place with a 4-0 nylon suture placed along the left side of the plate.  Platysma layer was then approximated with interrupted 3-0 Vicryl sutures, deep subcu layers with interrupted 3-0 Vicryl suture, then skin closed with a running subcu stitch of 4-0 Vicryl.  Tincture of Benzoin and Steri-Strips applied.  4 x 4 placed to the skin.  The right iliac crest bone graft harvest site hemostased using bone wax and then Gelfoam.  The periosteal layers reapproximated with interrupted #1 Vicryl sutures, more superficial layers with interrupted 0 then 2-0 Vicryl sutures and then the skin closed with a running subcu stitch of 4-0 Vicryl.  Tincture of Benzoin and Steri-Strips applied here and a 4 x 4 affixed to the skin with Hypafix tape and 4 x 4 of the neck affixed with Hypafix tape after charging the patients TLS drain to a red top tube.  A Philadelphia collar was applied. The patient was then reactivated and returned to the recovery room in satisfactory condition.  All instrument and sponge counts were correct. Dictated by:   Kerrin Champagne, M.D. Attending Physician:  Lubertha South DD:  10/18/01 TD:  10/19/01 Job: [id number]

## 2010-09-17 NOTE — Op Note (Signed)
NAME:  Lindsay Aguirre, Lindsay Aguirre             ACCOUNT NO.:  [id number]   MEDICAL RECORD NO.:  [id number]          PATIENT TYPE:  AMB   LOCATION:  SDS                          FACILITY:  [facility]   PHYSICIAN:  Burnard Bunting, M.D.    DATE OF BIRTH:  08-12-1950   DATE OF PROCEDURE:  DATE OF DISCHARGE:                               OPERATIVE REPORT   PREOPERATIVE DIAGNOSIS:  Right shoulder rotator cuff tear and bursitis.   POSTOPERATIVE DIAGNOSIS:  Right shoulder rotator cuff tear and bursitis.   PROCEDURE:  Right shoulder diagnostic arthroscopy with subacromial  decompression, mini open rotator cuff repair.   SURGEON:  Burnard Bunting, M.D.   ASSISTANT:  Dr. Marliss Czar.   ANESTHESIA:  General endotracheal.   ESTIMATED BLOOD LOSS:  20 mL.   INDICATION:  Lindsay Aguirre is a 60 year old patient with a full  thickness rotator cuff tear and bursitis who presents after failure of  the nonoperative management and explanation of risks and benefits of  surgery.   OPERATIVE FINDINGS:  1. Examination under anesthesia:  Range of motion external rotation 50      degree, abduction 70, isolated limb movement on abduction 120,      isolated with forward flexion 170.  2. Diagnostic arthroscopy:  1)  Early synovitis biceps root.  2)      Intact glenohumeral articular surface.  3)  Full thickness V shaped      tear of the leading edge of supraspinatus which I uncovered a      portion of the biceps tendon.  4)  Significant bursitis in the      subacromial space with type 3 hooked acromion.   PROCEDURE IN DETAIL:  The patient was brought to the operating room  where general endotracheal anesthesia was induced.  Preoperative  antibiotics were administered.  The patient was placed in the beach  chair position with the head in neutral position and the left arm well  padded.  The right arm, shoulder and hand were prepped with DuraPrep  solution and draped in a sterile manner.  Topographic anatomy of the  shoulder  was identified including the posterolateral and anterior  margins of the acromion.  Solution of saline and epinephrine was  injected in the subacromial space.  The posterior and lateral portals  were created 2 cm medial and inferior to the posterior and lateral  margin of acromion.  The anterior portal was then created under direct  visualization.  Diagnostic arthroscopy was performed.  Some synovitis in  the biceps root was present.  The biceps anchor itself was stable and  the biceps tendon itself was stable and without tearing.  There was a  tear of the supraspinatus tendon leading edge in a V shaped fashion  which measured about 2.5 x 1.5 cm.  A shaver was used to debride the  free edges of this tear.  The scope was then placed in the subacromial  space.  A lateral portal was created in line with the anticipated  incision.  The bursectomy with the ArthroCare wand and subacromial  decompression save ligament release was performed.  At this time, Lindsay Aguirre  was used to cover the operative field.  An incision was then made.  Skin  and subcutaneous tissue was sharply divided.  The deltoid was split  along the interval between its anterior and middle raphe.  A stay  sutures was placed and measured a distance of 3.5 cm from the anterior  lateral margin of the acromion.  Arthrex retractor was placed.  Full  thickness component of the rotator cuff tear was identified and it was  primarily the leading edge which was torn and retracted posteriorly.  Using a combination of FiberWire suture, Arthrex, as well as 2  corkscrews and 1 push-lock, water-tight repair was achieved to a  bleeding bony bed.  The incision was then thoroughly irrigated and  instruments were removed.  Deltoid split was repaired using a 0 Vicryl  suture, followed by interrupted inverted 2-0 Vicryl suture and running 3-  0 pull-off Prolene.  A sling was applied.  The patient tolerated the  procedure well without immediate  complications.  It should be noted that  Dr. Zoila Shutter assistance was required for retraction of neurovascular  structures and obtaining the exposure.  His assistance was medical  necessity for this case.      Burnard Bunting, M.D.  Electronically Signed     GSD/MEDQ  D:  08/08/2006  T:  08/08/2006  Job:  [id number]

## 2010-12-09 ENCOUNTER — Encounter (HOSPITAL_COMMUNITY)
Admission: RE | Admit: 2010-12-09 | Discharge: 2010-12-09 | Disposition: A | Discharge status: Home / Self Care | Payer: 59 | Source: Ambulatory Visit | Attending: Ophthalmology | Admitting: Ophthalmology

## 2010-12-09 ENCOUNTER — Other Ambulatory Visit: Payer: Self-pay

## 2010-12-09 ENCOUNTER — Encounter (HOSPITAL_COMMUNITY): Payer: Self-pay

## 2010-12-09 HISTORY — DX: Other specified postprocedural states: R11.2

## 2010-12-09 HISTORY — DX: Unspecified osteoarthritis, unspecified site: M19.90

## 2010-12-09 HISTORY — DX: Hypothyroidism, unspecified: E03.9

## 2010-12-09 HISTORY — DX: Essential (primary) hypertension: I10

## 2010-12-09 HISTORY — DX: Other specified postprocedural states: Z98.890

## 2010-12-09 LAB — CBC
Hemoglobin: 11.9 g/dL — ABNORMAL LOW (ref 12.0–15.0)
MCH: 33.7 pg (ref 26.0–34.0)
MCV: 96 fL (ref 78.0–100.0)
Platelets: 242 10*3/uL (ref 150–400)
RDW: 13.9 % (ref 11.5–15.5)

## 2010-12-09 LAB — BASIC METABOLIC PANEL
CO2: 24 mEq/L (ref 19–32)
Calcium: 10.6 mg/dL — ABNORMAL HIGH (ref 8.4–10.5)
Chloride: 101 mEq/L (ref 96–112)
GFR calc Af Amer: >60 mL/min (ref >60)
GFR calc non Af Amer: >60 mL/min (ref >60)
Glucose, Bld: 98 mg/dL (ref 70–99)
Potassium: 3.5 mEq/L (ref 3.5–5.1)
Sodium: 139 mEq/L (ref 135–145)

## 2010-12-09 MED ORDER — LACTATED RINGERS IV SOLN: INTRAVENOUS | Status: DC

## 2010-12-09 NOTE — Patient Instructions (Signed)
18 Lindsay Aguirre  12/09/2010   Your procedure is scheduled on:  12/13/10  Report to [facility] at 1:00 PM.  Call this number if you have problems the morning of surgery: [phone]   Remember:   Do not eat food:After Midnight.  Do not drink clear liquids: After Midnight.  Take these medicines the morning of surgery with A SIP OF WATER:synthroid, lisinopril, allopurinol   Do not wear jewelry, make-up or nail polish.  Do not wear lotions, powders, or perfumes. You may wear deodorant.  Do not shave 48 hours prior to surgery.  Do not bring valuables to the hospital.  Contacts, dentures or bridgework may not be worn into surgery.  Leave suitcase in the car. After surgery it may be brought to your room.  For patients admitted to the hospital, checkout time is 11:00 AM the day of discharge.   Patients discharged the day of surgery will not be allowed to drive home.  Name and phone number of your driver: family  Special Instructions: N/A    Please read over the following fact sheets that you were given: Pain Booklet, Anesthesia Post-op Instructions and Care and Recovery After Surgery   PATIENT INSTRUCTIONS POST-ANESTHESIA  IMMEDIATELY FOLLOWING SURGERY:  Do not drive or operate machinery for the first twenty four hours after surgery.  Do not make any important decisions for twenty four hours after surgery or while taking narcotic pain medications or sedatives.  If you develop intractable nausea and vomiting or a severe headache please notify your doctor immediately.  FOLLOW-UP:  Please make an appointment with your surgeon as instructed. You do not need to follow up with anesthesia unless specifically instructed to do so.  WOUND CARE INSTRUCTIONS (if applicable):  Keep a dry clean dressing on the anesthesia/puncture wound site if there is drainage.  Once the wound has quit draining you may leave it open to air.  Generally you should leave the bandage intact for twenty four hours unless  there is drainage.  If the epidural site drains for more than 36-48 hours please call the anesthesia department.  QUESTIONS?:  Please feel free to call your physician or the hospital operator if you have any questions, and they will be happy to assist you.     [facility] Anesthesia Department [address] [place] [phone]

## 2010-12-13 ENCOUNTER — Encounter (HOSPITAL_COMMUNITY)
Admission: RE | Disposition: A | Discharge status: Home / Self Care | Payer: Self-pay | Source: Ambulatory Visit | Attending: Ophthalmology

## 2010-12-13 ENCOUNTER — Encounter (HOSPITAL_COMMUNITY): Payer: Self-pay | Admitting: Anesthesiology

## 2010-12-13 ENCOUNTER — Encounter (HOSPITAL_COMMUNITY): Payer: Self-pay | Admitting: *Deleted

## 2010-12-13 ENCOUNTER — Ambulatory Visit (HOSPITAL_COMMUNITY)
Admission: RE | Admit: 2010-12-13 | Discharge: 2010-12-13 | Disposition: A | Discharge status: Home / Self Care | Payer: 59 | Source: Ambulatory Visit | Attending: Ophthalmology | Admitting: Ophthalmology

## 2010-12-13 ENCOUNTER — Ambulatory Visit (HOSPITAL_COMMUNITY): Payer: 59 | Admitting: Anesthesiology

## 2010-12-13 DIAGNOSIS — I10 Essential (primary) hypertension: Secondary | ICD-10-CM | POA: Insufficient documentation

## 2010-12-13 DIAGNOSIS — Z01812 Encounter for preprocedural laboratory examination: Secondary | ICD-10-CM | POA: Insufficient documentation

## 2010-12-13 DIAGNOSIS — H25049 Posterior subcapsular polar age-related cataract, unspecified eye: Secondary | ICD-10-CM | POA: Insufficient documentation

## 2010-12-13 DIAGNOSIS — Z79899 Other long term (current) drug therapy: Secondary | ICD-10-CM | POA: Insufficient documentation

## 2010-12-13 DIAGNOSIS — Z01810 Encounter for preprocedural cardiovascular examination: Secondary | ICD-10-CM | POA: Insufficient documentation

## 2010-12-13 HISTORY — PX: CATARACT EXTRACTION W/PHACO: SHX586

## 2010-12-13 SURGERY — PHACOEMULSIFICATION, CATARACT, WITH IOL INSERTION
Anesthesia: Monitor Anesthesia Care | Site: Eye | Laterality: Right | Wound class: Clean

## 2010-12-13 MED ORDER — MIDAZOLAM HCL 2 MG/2ML IJ SOLN
INTRAMUSCULAR | Status: AC
  Filled 2010-12-13: qty 2

## 2010-12-13 MED ORDER — MIDAZOLAM HCL 2 MG/2ML IJ SOLN
1.0000 mg | INTRAMUSCULAR | Status: DC | PRN
  Administered 2010-12-13: 2 mg via INTRAVENOUS

## 2010-12-13 MED ORDER — LIDOCAINE HCL 3.5 % OP GEL
OPHTHALMIC | Status: AC
  Filled 2010-12-13: qty 5

## 2010-12-13 MED ORDER — PHENYLEPHRINE HCL 2.5 % OP SOLN
OPHTHALMIC | Status: AC
  Administered 2010-12-13: 1 [drp] via OPHTHALMIC
  Filled 2010-12-13: qty 2

## 2010-12-13 MED ORDER — BSS IO SOLN
INTRAOCULAR | Status: DC | PRN
  Administered 2010-12-13: 30 mL via INTRAOCULAR

## 2010-12-13 MED ORDER — CYCLOPENTOLATE-PHENYLEPHRINE 0.2-1 % OP SOLN
OPHTHALMIC | Status: AC
  Administered 2010-12-13: 1 [drp] via OPHTHALMIC
  Filled 2010-12-13: qty 2

## 2010-12-13 MED ORDER — NEOMYCIN-POLYMYXIN-DEXAMETH 3.5-10000-0.1 OP OINT
TOPICAL_OINTMENT | OPHTHALMIC | Status: AC
  Filled 2010-12-13: qty 3.5

## 2010-12-13 MED ORDER — CYCLOPENTOLATE-PHENYLEPHRINE 0.2-1 % OP SOLN
1.0000 [drp] | OPHTHALMIC | Status: AC
  Administered 2010-12-13 (×3): 1 [drp] via OPHTHALMIC

## 2010-12-13 MED ORDER — LIDOCAINE HCL (PF) 1 % IJ SOLN
INTRAMUSCULAR | Status: AC
  Filled 2010-12-13: qty 2

## 2010-12-13 MED ORDER — TETRACAINE HCL 0.5 % OP SOLN
OPHTHALMIC | Status: AC
  Administered 2010-12-13: 1 [drp] via OPHTHALMIC
  Filled 2010-12-13: qty 2

## 2010-12-13 MED ORDER — NEOMYCIN-POLYMYXIN-DEXAMETH 0.1 % OP OINT
TOPICAL_OINTMENT | OPHTHALMIC | Status: DC | PRN
  Administered 2010-12-13: 1 via OPHTHALMIC

## 2010-12-13 MED ORDER — LACTATED RINGERS IV SOLN
INTRAVENOUS | Status: DC
  Administered 2010-12-13: 14:00:00 via INTRAVENOUS

## 2010-12-13 MED ORDER — LIDOCAINE HCL 3.5 % OP GEL
1.0000 "application " | Freq: Once | OPHTHALMIC | Status: AC
  Administered 2010-12-13: 1 via OPHTHALMIC

## 2010-12-13 MED ORDER — PROVISC 10 MG/ML IO SOLN
INTRAOCULAR | Status: DC | PRN
  Administered 2010-12-13: 8.5 mg via INTRAOCULAR

## 2010-12-13 MED ORDER — PHENYLEPHRINE HCL 2.5 % OP SOLN
1.0000 [drp] | OPHTHALMIC | Status: AC
  Administered 2010-12-13 (×3): 1 [drp] via OPHTHALMIC

## 2010-12-13 MED ORDER — LIDOCAINE HCL (PF) 1 % IJ SOLN
INTRAMUSCULAR | Status: DC | PRN
  Administered 2010-12-13: .3 mL

## 2010-12-13 MED ORDER — TETRACAINE HCL 0.5 % OP SOLN
1.0000 [drp] | OPHTHALMIC | Status: AC
  Administered 2010-12-13 (×3): 1 [drp] via OPHTHALMIC

## 2010-12-13 MED ORDER — POVIDONE-IODINE 5 % OP SOLN
OPHTHALMIC | Status: DC | PRN
  Administered 2010-12-13: 1 via OPHTHALMIC

## 2010-12-13 MED ORDER — EPINEPHRINE HCL 1 MG/ML IJ SOLN
INTRAOCULAR | Status: DC | PRN
  Administered 2010-12-13: 15:00:00

## 2010-12-13 MED ORDER — LIDOCAINE HCL 3.5 % OP GEL
OPHTHALMIC | Status: DC | PRN
  Administered 2010-12-13: 1 via OPHTHALMIC

## 2010-12-13 SURGICAL SUPPLY — 32 items
CAPSULAR TENSION RING-AMO (OPHTHALMIC RELATED) IMPLANT
CLOTH BEACON ORANGE TIMEOUT ST (SAFETY) ×1 IMPLANT
DUOVISC SYSTEM (INTRAOCULAR LENS)
EYE SHIELD UNIVERSAL CLEAR (GAUZE/BANDAGES/DRESSINGS) ×1 IMPLANT
GLOVE BIO SURGEON STRL SZ 6.5 (GLOVE) IMPLANT
GLOVE BIOGEL PI IND STRL 6.5 (GLOVE) IMPLANT
GLOVE BIOGEL PI IND STRL 7.0 (GLOVE) IMPLANT
GLOVE BIOGEL PI IND STRL 7.5 (GLOVE) IMPLANT
GLOVE BIOGEL PI INDICATOR 6.5 (GLOVE)
GLOVE BIOGEL PI INDICATOR 7.0 (GLOVE)
GLOVE BIOGEL PI INDICATOR 7.5 (GLOVE) ×1
GLOVE ECLIPSE 6.5 STRL STRAW (GLOVE) IMPLANT
GLOVE ECLIPSE 7.0 STRL STRAW (GLOVE) IMPLANT
GLOVE ECLIPSE 7.5 STRL STRAW (GLOVE) ×1 IMPLANT
GLOVE EXAM NITRILE LRG STRL (GLOVE) IMPLANT
GLOVE EXAM NITRILE MD LF STRL (GLOVE) ×1 IMPLANT
GLOVE SKINSENSE NS SZ6.5 (GLOVE)
GLOVE SKINSENSE NS SZ7.0 (GLOVE)
GLOVE SKINSENSE STRL SZ6.5 (GLOVE) IMPLANT
GLOVE SKINSENSE STRL SZ7.0 (GLOVE) IMPLANT
KIT VITRECTOMY (OPHTHALMIC RELATED) IMPLANT
PAD ARMBOARD 7.5X6 YLW CONV (MISCELLANEOUS) ×1 IMPLANT
PROC W NO LENS (INTRAOCULAR LENS)
PROC W SPEC LENS (INTRAOCULAR LENS)
PROCESS W NO LENS (INTRAOCULAR LENS) IMPLANT
PROCESS W SPEC LENS (INTRAOCULAR LENS) IMPLANT
RING MALYGIN (MISCELLANEOUS) IMPLANT
SIGHTPATH CAT PROC W REG LENS (Ophthalmic Related) ×2 IMPLANT
SYR TB 1ML LL NO SAFETY (SYRINGE) ×1 IMPLANT
SYSTEM DUOVISC (INTRAOCULAR LENS) IMPLANT
VISCOELASTIC ADDITIONAL (OPHTHALMIC RELATED) IMPLANT
WATER STERILE IRR 250ML POUR (IV SOLUTION) ×1 IMPLANT

## 2010-12-13 NOTE — Anesthesia Procedure Notes (Signed)
Procedure Name: MAC Date/Time: 12/13/2010 2:35 PM Performed by: Minerva Areola Pre-anesthesia Checklist: Patient identified, Patient being monitored, Emergency Drugs available, Timeout performed and Suction available Oxygen Delivery Method: Nasal Cannula

## 2010-12-13 NOTE — H&P (Signed)
I have evaluated the patient preoperatively, and have identified no interval changes in medical condition and plan of care since the history and physical of record 

## 2010-12-13 NOTE — Anesthesia Preprocedure Evaluation (Addendum)
Anesthesia Evaluation  Name, MR# and DOB Patient awake  General Assessment Comment  Reviewed: Allergy & Precautions, H&P , NPO status , Patient's Chart, lab work & pertinent test results  History of Anesthesia Complications (+) PONV  Airway Mallampati: II  Neck ROM: Full    Dental  (+) Teeth Intact   Pulmonary  clear to auscultation  breath sounds clear to auscultation none    Cardiovascular hypertension, Pt. on medications Regular Normal    Neuro/Psych   GI/Hepatic/Renal   Endo/Other  (+) Hypothyroidism,      Abdominal   Musculoskeletal   Hematology   Peds  Reproductive/Obstetrics    Anesthesia Other Findings             Anesthesia Physical Anesthesia Plan  ASA: II  Anesthesia Plan: MAC   Post-op Pain Management:    Induction: Intravenous  Airway Management Planned: Nasal Cannula  Additional Equipment:   Intra-op Plan:   Post-operative Plan:   Informed Consent: I have reviewed the patients History and Physical, chart, labs and discussed the procedure including the risks, benefits and alternatives for the proposed anesthesia with the patient or authorized representative who has indicated his/her understanding and acceptance.     Plan Discussed with:   Anesthesia Plan Comments:         Anesthesia Quick Evaluation

## 2010-12-13 NOTE — Transfer of Care (Signed)
Immediate Anesthesia Transfer of Care Note  Patient: Lindsay Aguirre  Procedure(s) Performed:  CATARACT EXTRACTION PHACO AND INTRAOCULAR LENS PLACEMENT (IOC) - CDE: 8.94  Patient Location: Shortstay  Anesthesia Type: MAC  Level of Consciousness: awake  Airway & Oxygen Therapy: Patient Spontanous Breathing   Post-op Assessment: Report given to PACU RN, Post -op Vital signs reviewed and stable and Patient moving all extremities  Post vital signs: Reviewed and stable  Complications: No apparent anesthesia complications

## 2010-12-13 NOTE — Brief Op Note (Signed)
12/13/2010  2:48 PM  PATIENT:  Lindsay Aguirre  60 y.o. female  PRE-OPERATIVE DIAGNOSIS:  nuclear cataract right eye  POST-OPERATIVE DIAGNOSIS:  nuclear cataract right eye  PROCEDURE:  Procedure(s): CATARACT EXTRACTION PHACO AND INTRAOCULAR LENS PLACEMENT (IOC)  SURGEON:  Surgeon(s): Gemma Payor   ANESTHESIA:   local and IV sedation   SPECIMEN:  No Specimen  COMPLICATIONS: None

## 2010-12-13 NOTE — Anesthesia Postprocedure Evaluation (Signed)
  Anesthesia Post-op Note  Patient: Lindsay Aguirre  Procedure(s) Performed:  CATARACT EXTRACTION PHACO AND INTRAOCULAR LENS PLACEMENT (IOC) - CDE: 8.94  Patient Location:  Short Stay  Anesthesia Type: MAC  Level of Consciousness: awake  Airway and Oxygen Therapy: Patient Spontanous Breathing  Post-op Pain: none  Post-op Assessment: Post-op Vital signs reviewed, Patient's Cardiovascular Status Stable, Respiratory Function Stable, Patent Airway, No signs of Nausea or vomiting and Pain level controlled  Post-op Vital Signs: Reviewed and stable  Complications: No apparent anesthesia complications

## 2010-12-14 NOTE — Op Note (Signed)
NAMEARCHIE, ATILANO             ACCOUNT NO.:  [id number]  MEDICAL RECORD NO.:  [id number]  LOCATION:  [facility]                          FACILITY:  [facility]  PHYSICIAN:  Susanne Greenhouse, MD       DATE OF BIRTH:  22-Aug-1950  DATE OF PROCEDURE: DATE OF DISCHARGE:  12/13/2010                              OPERATIVE REPORT   PREOPERATIVE DIAGNOSIS:  Posterior subcapsular cataract, right eye, diagnosis code 366.14.  POSTOPERATIVE DIAGNOSIS:  Posterior subcapsular cataract, right eye, diagnosis code 366.14.  PROCEDURE PERFORMED:  Phacoemulsification with intraocular lens implantation, right eye.  SURGEON:  Bonne Dolores. Errin Whitelaw, MD  DESCRIPTION OF OPERATION:  In the preoperative holding area, dilating drops and viscous lidocaine were placed into the right eye.  The patient was then brought to the operating room where she was prepped and draped. Beginning with a 75-blade, a paracentesis port was made at the surgeon's 2 o'clock position.  The anterior chamber was then filled with a 1% nonpreserved lidocaine solution.  This was followed by filling the anterior chamber with Provisc.  The 2.4-mm keratome blade was then used to make a clear corneal incision at the temporal limbus.  A bent cystotome needle and Utrata forceps were used to create a continuous tear capsulotomy.  Hydrodissection was performed with balanced salt solution on a fine cannula.  The lens nucleus was then removed using phacoemulsification in a quadrant cracking technique.  Residual cortex was removed with irrigation and aspiration.  The capsular bag and anterior chamber were then refilled with Provisc and a posterior chamber intraocular lens was placed into the capsular bag without difficulty using a lens injecting system.  The Provisc was removed from the capsular bag and anterior chamber with irrigation and aspiration. Stromal hydration of the main incision and paracentesis ports was performed with balanced salt solution on a fine  cannula.  The wounds were tested for leak, which were negative.  The patient tolerated the procedure well.  There were no operative complications and she was returned to the recovery area in satisfactory condition.  No surgical specimens.  PROSTHETIC DEVICE USED:  Lenstec posterior chamber lens, model Softec HD, power of 27.0, serial number is [id number].          ______________________________ Susanne Greenhouse, MD     KEH/MEDQ  D:  12/13/2010  T:  12/14/2010  Job:  [id number]

## 2010-12-20 ENCOUNTER — Encounter (HOSPITAL_COMMUNITY): Payer: Self-pay | Admitting: Ophthalmology

## 2011-02-01 LAB — COMPREHENSIVE METABOLIC PANEL
ALT: 34
AST: 20
Albumin: 4.1
CO2: 29
Calcium: 9.8
GFR calc Af Amer: >60
GFR calc non Af Amer: >60
Sodium: 138
Total Protein: 6.6

## 2011-02-01 LAB — HEMOGLOBIN AND HEMATOCRIT, BLOOD
HCT: 23.9 — ABNORMAL LOW
HCT: 27.5 — ABNORMAL LOW
HCT: 32.2 — ABNORMAL LOW
Hemoglobin: 8.6 — ABNORMAL LOW
Hemoglobin: 9.6 — ABNORMAL LOW

## 2011-02-01 LAB — DIFFERENTIAL
Eosinophils Absolute: 0
Eosinophils Relative: 1
Lymphs Abs: 1.8
Monocytes Absolute: 0.4
Monocytes Relative: 5

## 2011-02-01 LAB — TYPE AND SCREEN: ABO/RH(D): O POS

## 2011-02-01 LAB — URINALYSIS, ROUTINE W REFLEX MICROSCOPIC
Glucose, UA: NEGATIVE
Hgb urine dipstick: NEGATIVE
Specific Gravity, Urine: 1.008
pH: 6

## 2011-02-01 LAB — BASIC METABOLIC PANEL
BUN: 15
CO2: 26
Calcium: 8.6
Creatinine, Ser: 0.87
GFR calc Af Amer: >60

## 2011-02-01 LAB — CBC
MCHC: 34
RBC: 3.19 — ABNORMAL LOW

## 2011-03-28 ENCOUNTER — Other Ambulatory Visit: Payer: Self-pay | Admitting: Specialist

## 2011-03-28 ENCOUNTER — Encounter (HOSPITAL_COMMUNITY): Payer: Self-pay | Admitting: Pharmacy Technician

## 2011-03-28 DIAGNOSIS — M79606 Pain in leg, unspecified: Secondary | ICD-10-CM

## 2011-03-29 ENCOUNTER — Ambulatory Visit
Admission: RE | Admit: 2011-03-29 | Discharge: 2011-03-29 | Disposition: A | Discharge status: Home / Self Care | Payer: 59 | Source: Ambulatory Visit | Attending: Specialist | Admitting: Specialist

## 2011-03-29 ENCOUNTER — Other Ambulatory Visit (HOSPITAL_COMMUNITY): Payer: Self-pay | Admitting: Specialist

## 2011-03-29 DIAGNOSIS — M79606 Pain in leg, unspecified: Secondary | ICD-10-CM

## 2011-03-29 MED ORDER — GADOBENATE DIMEGLUMINE 529 MG/ML IV SOLN
18.0000 mL | Freq: Once | INTRAVENOUS | Status: AC | PRN
  Administered 2011-03-29: 18 mL via INTRAVENOUS

## 2011-03-30 ENCOUNTER — Encounter (HOSPITAL_COMMUNITY)
Admission: RE | Admit: 2011-03-30 | Discharge: 2011-03-30 | Disposition: A | Discharge status: Home / Self Care | Payer: 59 | Source: Ambulatory Visit | Attending: Specialist | Admitting: Specialist

## 2011-03-30 ENCOUNTER — Encounter (HOSPITAL_COMMUNITY): Payer: Self-pay

## 2011-03-30 ENCOUNTER — Other Ambulatory Visit: Payer: Self-pay

## 2011-03-30 ENCOUNTER — Ambulatory Visit (HOSPITAL_COMMUNITY)
Admission: RE | Admit: 2011-03-30 | Discharge: 2011-03-30 | Disposition: A | Discharge status: Home / Self Care | Payer: 59 | Source: Ambulatory Visit | Attending: Specialist | Admitting: Specialist

## 2011-03-30 HISTORY — DX: Myoneural disorder, unspecified: G70.9

## 2011-03-30 LAB — COMPREHENSIVE METABOLIC PANEL
Albumin: 4.2 g/dL (ref 3.5–5.2)
Alkaline Phosphatase: 68 U/L (ref 39–117)
BUN: 25 mg/dL — ABNORMAL HIGH (ref 6–23)
Calcium: 10.2 mg/dL (ref 8.4–10.5)
GFR calc Af Amer: 78 mL/min — ABNORMAL LOW (ref >90)
Potassium: 3.4 mEq/L — ABNORMAL LOW (ref 3.5–5.1)
Total Protein: 7.4 g/dL (ref 6.0–8.3)

## 2011-03-30 LAB — DIFFERENTIAL
Eosinophils Absolute: 0.1 10*3/uL (ref 0.0–0.7)
Lymphocytes Relative: 16 % (ref 12–46)
Lymphs Abs: 1.8 10*3/uL (ref 0.7–4.0)
Monocytes Relative: 6 % (ref 3–12)
Neutro Abs: 8.5 10*3/uL — ABNORMAL HIGH (ref 1.7–7.7)
Neutrophils Relative %: 77 % (ref 43–77)

## 2011-03-30 LAB — CBC
HCT: 39.3 % (ref 36.0–46.0)
MCH: 33 pg (ref 26.0–34.0)
MCHC: 34.4 g/dL (ref 30.0–36.0)
RDW: 13.7 % (ref 11.5–15.5)

## 2011-03-30 LAB — TYPE AND SCREEN: ABO/RH(D): O POS

## 2011-03-30 LAB — URINALYSIS, ROUTINE W REFLEX MICROSCOPIC
Bilirubin Urine: NEGATIVE
Hgb urine dipstick: NEGATIVE
Specific Gravity, Urine: 1.02 (ref 1.005–1.030)
pH: 7 (ref 5.0–8.0)

## 2011-03-30 LAB — SURGICAL PCR SCREEN: Staphylococcus aureus: POSITIVE — AB

## 2011-03-30 MED ORDER — CEFAZOLIN SODIUM-DEXTROSE 2-3 GM-% IV SOLR
2.0000 g | INTRAVENOUS | Status: AC
  Administered 2011-03-31: 2 g via INTRAVENOUS
  Filled 2011-03-30: qty 50

## 2011-03-30 NOTE — Pre-Procedure Instructions (Signed)
20 Lindsay Aguirre  03/30/2011   Your procedure is scheduled on:  03/31/2011  Report to [name] at 1045 AM.  Call this number if you have problems the morning of surgery: [phone]   Remember:   Do not eat food:After Midnight.  May have clear liquids: up to 4 Hours before arrival.  Clear liquids include soda, tea, black coffee, apple or grape juice, broth.  Take these medicines the morning of surgery with A SIP OF WATER: SYNTHROID   Do not wear jewelry, make-up or nail polish.  Do not wear lotions, powders, or perfumes. You may wear deodorant.  Do not shave 48 hours prior to surgery.  Do not bring valuables to the hospital.  Contacts, dentures or bridgework may not be worn into surgery.  Leave suitcase in the car. After surgery it may be brought to your room.  For patients admitted to the hospital, checkout time is 11:00 AM the day of discharge.   Patients discharged the day of surgery will not be allowed to drive home.  Name and phone number of your driver: FAMILY  Special Instructions: CHG Shower Use Special Wash: 1/2 bottle night before surgery and 1/2 bottle morning of surgery.   Please read over the following fact sheets that you were given: Pain Booklet, Coughing and Deep Breathing, Blood Transfusion Information, MRSA Information and Surgical Site Infection Prevention

## 2011-03-31 ENCOUNTER — Inpatient Hospital Stay (HOSPITAL_COMMUNITY): Payer: 59

## 2011-03-31 ENCOUNTER — Encounter (HOSPITAL_COMMUNITY): Payer: Self-pay

## 2011-03-31 ENCOUNTER — Encounter (HOSPITAL_COMMUNITY): Payer: Self-pay | Admitting: Specialist

## 2011-03-31 ENCOUNTER — Encounter (HOSPITAL_COMMUNITY): Payer: Self-pay | Admitting: *Deleted

## 2011-03-31 ENCOUNTER — Encounter (HOSPITAL_COMMUNITY)
Admission: RE | Disposition: A | Discharge status: Home / Self Care | Payer: Self-pay | Source: Ambulatory Visit | Attending: Specialist

## 2011-03-31 ENCOUNTER — Inpatient Hospital Stay (HOSPITAL_COMMUNITY)
Admission: RE | Admit: 2011-03-31 | Discharge: 2011-04-04 | DRG: 460 | Disposition: A | Discharge status: Home / Self Care | Payer: 59 | Source: Ambulatory Visit | Attending: Specialist | Admitting: Specialist

## 2011-03-31 DIAGNOSIS — M5126 Other intervertebral disc displacement, lumbar region: Principal | ICD-10-CM | POA: Diagnosis present

## 2011-03-31 DIAGNOSIS — M109 Gout, unspecified: Secondary | ICD-10-CM | POA: Diagnosis present

## 2011-03-31 DIAGNOSIS — M4310 Spondylolisthesis, site unspecified: Secondary | ICD-10-CM

## 2011-03-31 DIAGNOSIS — E039 Hypothyroidism, unspecified: Secondary | ICD-10-CM | POA: Diagnosis present

## 2011-03-31 DIAGNOSIS — I10 Essential (primary) hypertension: Secondary | ICD-10-CM | POA: Diagnosis present

## 2011-03-31 SURGERY — POSTERIOR LUMBAR FUSION 2 WITH HARDWARE REMOVAL
Anesthesia: General | Site: Back | Wound class: Clean

## 2011-03-31 MED ORDER — LACTATED RINGERS IV SOLN
INTRAVENOUS | Status: DC | PRN
  Administered 2011-03-31 (×4): via INTRAVENOUS

## 2011-03-31 MED ORDER — ONDANSETRON HCL 4 MG/2ML IJ SOLN
4.0000 mg | Freq: Four times a day (QID) | INTRAMUSCULAR | Status: DC | PRN
  Administered 2011-04-01 – 2011-04-02 (×2): 4 mg via INTRAVENOUS

## 2011-03-31 MED ORDER — PHENYLEPHRINE HCL 10 MG/ML IJ SOLN
20.0000 mg | INTRAVENOUS | Status: DC | PRN
  Administered 2011-03-31: 10 ug/min via INTRAVENOUS

## 2011-03-31 MED ORDER — ZOLPIDEM TARTRATE 10 MG PO TABS: 10.0000 mg | ORAL_TABLET | Freq: Every evening | ORAL | Status: DC | PRN

## 2011-03-31 MED ORDER — METHOCARBAMOL 100 MG/ML IJ SOLN
500.0000 mg | INTRAVENOUS | Status: DC
  Filled 2011-03-31: qty 5

## 2011-03-31 MED ORDER — ACETAMINOPHEN 650 MG RE SUPP: 650.0000 mg | RECTAL | Status: DC | PRN

## 2011-03-31 MED ORDER — FENTANYL CITRATE 0.05 MG/ML IJ SOLN
INTRAMUSCULAR | Status: DC | PRN
  Administered 2011-03-31 (×2): 50 ug via INTRAVENOUS
  Administered 2011-03-31: 100 ug via INTRAVENOUS
  Administered 2011-03-31: 50 ug via INTRAVENOUS

## 2011-03-31 MED ORDER — MEPERIDINE HCL 25 MG/ML IJ SOLN: 6.2500 mg | INTRAMUSCULAR | Status: DC | PRN

## 2011-03-31 MED ORDER — THROMBIN 20000 UNITS EX KIT: PACK | CUTANEOUS | Status: DC | PRN

## 2011-03-31 MED ORDER — GLYCOPYRROLATE 0.2 MG/ML IJ SOLN
INTRAMUSCULAR | Status: DC | PRN
  Administered 2011-03-31: .8 mg via INTRAVENOUS

## 2011-03-31 MED ORDER — HYDROMORPHONE HCL PF 1 MG/ML IJ SOLN
0.2500 mg | INTRAMUSCULAR | Status: DC | PRN
  Administered 2011-03-31 (×4): 0.5 mg via INTRAVENOUS

## 2011-03-31 MED ORDER — ACETAMINOPHEN 325 MG PO TABS: 650.0000 mg | ORAL_TABLET | ORAL | Status: DC | PRN

## 2011-03-31 MED ORDER — MORPHINE SULFATE (PF) 1 MG/ML IV SOLN
INTRAVENOUS | Status: DC
  Administered 2011-03-31: 20:00:00 via INTRAVENOUS
  Administered 2011-03-31: 22.33 mg via INTRAVENOUS
  Administered 2011-04-01: 29.83 mg via INTRAVENOUS
  Administered 2011-04-02: 1 mg via INTRAVENOUS
  Filled 2011-03-31 (×2): qty 25

## 2011-03-31 MED ORDER — SODIUM CHLORIDE 0.9 % IJ SOLN
3.0000 mL | Freq: Two times a day (BID) | INTRAMUSCULAR | Status: DC
  Administered 2011-03-31 – 2011-04-01 (×2): 3 mL via INTRAVENOUS

## 2011-03-31 MED ORDER — METHOCARBAMOL 100 MG/ML IJ SOLN
500.0000 mg | INTRAVENOUS | Status: AC
  Filled 2011-03-31: qty 5

## 2011-03-31 MED ORDER — MENTHOL 3 MG MT LOZG: 1.0000 | LOZENGE | OROMUCOSAL | Status: DC | PRN

## 2011-03-31 MED ORDER — LACTATED RINGERS IV SOLN
INTRAVENOUS | Status: DC
  Administered 2011-03-31: 13:00:00 via INTRAVENOUS

## 2011-03-31 MED ORDER — VECURONIUM BROMIDE 10 MG IV SOLR
INTRAVENOUS | Status: DC | PRN
  Administered 2011-03-31: 10 mg via INTRAVENOUS
  Administered 2011-03-31: 2 mg via INTRAVENOUS

## 2011-03-31 MED ORDER — PROPOFOL 10 MG/ML IV EMUL
INTRAVENOUS | Status: DC | PRN
  Administered 2011-03-31: 110 mg via INTRAVENOUS

## 2011-03-31 MED ORDER — POLYETHYLENE GLYCOL 3350 17 G PO PACK
17.0000 g | PACK | Freq: Every day | ORAL | Status: DC | PRN
  Administered 2011-04-03: 17 g via ORAL
  Filled 2011-03-31: qty 1

## 2011-03-31 MED ORDER — MORPHINE SULFATE 4 MG/ML IJ SOLN: 1.0000 mg | INTRAMUSCULAR | Status: DC | PRN

## 2011-03-31 MED ORDER — HYDROCHLOROTHIAZIDE 12.5 MG PO CAPS
12.5000 mg | ORAL_CAPSULE | Freq: Every day | ORAL | Status: DC
  Administered 2011-04-02 – 2011-04-04 (×2): 12.5 mg via ORAL
  Filled 2011-03-31 (×4): qty 1

## 2011-03-31 MED ORDER — FLEET ENEMA 7-19 GM/118ML RE ENEM: 1.0000 | ENEMA | Freq: Once | RECTAL | Status: AC | PRN

## 2011-03-31 MED ORDER — ALLOPURINOL 300 MG PO TABS
300.0000 mg | ORAL_TABLET | Freq: Every day | ORAL | Status: DC
  Administered 2011-04-01 – 2011-04-04 (×3): 300 mg via ORAL
  Filled 2011-03-31 (×4): qty 1

## 2011-03-31 MED ORDER — HYDROCODONE-ACETAMINOPHEN 5-325 MG PO TABS
1.0000 | ORAL_TABLET | ORAL | Status: DC | PRN
  Administered 2011-04-01 (×3): 2 via ORAL
  Administered 2011-04-03: 1 via ORAL
  Filled 2011-03-31 (×5): qty 2

## 2011-03-31 MED ORDER — DOCUSATE SODIUM 100 MG PO CAPS
100.0000 mg | ORAL_CAPSULE | Freq: Two times a day (BID) | ORAL | Status: DC
  Administered 2011-03-31 – 2011-04-04 (×6): 100 mg via ORAL
  Filled 2011-03-31 (×8): qty 1

## 2011-03-31 MED ORDER — NEOSTIGMINE METHYLSULFATE 1 MG/ML IJ SOLN
INTRAMUSCULAR | Status: DC | PRN
  Administered 2011-03-31: 5 mg via INTRAVENOUS

## 2011-03-31 MED ORDER — METHOCARBAMOL 100 MG/ML IJ SOLN
500.0000 mg | Freq: Four times a day (QID) | INTRAVENOUS | Status: DC | PRN
  Administered 2011-03-31: 500 mg via INTRAVENOUS

## 2011-03-31 MED ORDER — SODIUM CHLORIDE 0.9 % IJ SOLN: 3.0000 mL | INTRAMUSCULAR | Status: DC | PRN

## 2011-03-31 MED ORDER — SODIUM CHLORIDE 0.9 % IV SOLN
250.0000 mL | INTRAVENOUS | Status: DC
  Administered 2011-03-31: 250 mL via INTRAVENOUS

## 2011-03-31 MED ORDER — ONDANSETRON HCL 4 MG/2ML IJ SOLN
INTRAMUSCULAR | Status: DC | PRN
  Administered 2011-03-31: 4 mg via INTRAVENOUS

## 2011-03-31 MED ORDER — PHENOL 1.4 % MT LIQD
1.0000 | OROMUCOSAL | Status: DC | PRN
  Filled 2011-03-31: qty 177

## 2011-03-31 MED ORDER — MIDAZOLAM HCL 5 MG/5ML IJ SOLN
INTRAMUSCULAR | Status: DC | PRN
  Administered 2011-03-31: 2 mg via INTRAVENOUS

## 2011-03-31 MED ORDER — NALOXONE HCL 0.4 MG/ML IJ SOLN: 0.4000 mg | INTRAMUSCULAR | Status: DC | PRN

## 2011-03-31 MED ORDER — ONDANSETRON HCL 4 MG/2ML IJ SOLN
4.0000 mg | INTRAMUSCULAR | Status: DC | PRN
  Administered 2011-03-31 – 2011-04-03 (×4): 4 mg via INTRAVENOUS
  Filled 2011-03-31 (×6): qty 2

## 2011-03-31 MED ORDER — LEVOTHYROXINE SODIUM 88 MCG PO TABS
88.0000 ug | ORAL_TABLET | Freq: Every day | ORAL | Status: DC
  Administered 2011-04-01 – 2011-04-04 (×3): 88 ug via ORAL
  Filled 2011-03-31 (×5): qty 1

## 2011-03-31 MED ORDER — KETOROLAC TROMETHAMINE 30 MG/ML IJ SOLN
30.0000 mg | Freq: Three times a day (TID) | INTRAMUSCULAR | Status: AC | PRN
  Administered 2011-03-31: 30 mg via INTRAVENOUS

## 2011-03-31 MED ORDER — SCOPOLAMINE 1 MG/3DAYS TD PT72
MEDICATED_PATCH | TRANSDERMAL | Status: DC | PRN
  Administered 2011-03-31: 1 via TRANSDERMAL

## 2011-03-31 MED ORDER — POTASSIUM CHLORIDE IN NACL 20-0.9 MEQ/L-% IV SOLN
INTRAVENOUS | Status: DC
  Administered 2011-03-31 – 2011-04-01 (×3): via INTRAVENOUS
  Filled 2011-03-31 (×5): qty 1000

## 2011-03-31 MED ORDER — METHOCARBAMOL 500 MG PO TABS
500.0000 mg | ORAL_TABLET | Freq: Four times a day (QID) | ORAL | Status: DC | PRN
Start: 2011-03-31 — End: 2011-04-04
  Administered 2011-04-02 – 2011-04-03 (×3): 500 mg via ORAL
  Filled 2011-03-31 (×5): qty 1

## 2011-03-31 MED ORDER — CEFAZOLIN SODIUM 1-5 GM-% IV SOLN
1.0000 g | Freq: Three times a day (TID) | INTRAVENOUS | Status: AC
  Administered 2011-03-31 – 2011-04-01 (×2): 1 g via INTRAVENOUS
  Filled 2011-03-31 (×2): qty 50

## 2011-03-31 MED ORDER — SODIUM CHLORIDE 0.9 % IJ SOLN: 9.0000 mL | INTRAMUSCULAR | Status: DC | PRN

## 2011-03-31 MED ORDER — OXYCODONE-ACETAMINOPHEN 5-325 MG PO TABS
1.0000 | ORAL_TABLET | ORAL | Status: DC | PRN
  Filled 2011-03-31: qty 2

## 2011-03-31 MED ORDER — THROMBIN 20000 UNITS EX KIT
PACK | CUTANEOUS | Status: DC | PRN
  Administered 2011-03-31: 14:00:00 via TOPICAL

## 2011-03-31 MED ORDER — ONDANSETRON HCL 4 MG/2ML IJ SOLN: 4.0000 mg | Freq: Once | INTRAMUSCULAR | Status: DC | PRN

## 2011-03-31 MED ORDER — LISINOPRIL 20 MG PO TABS
20.0000 mg | ORAL_TABLET | Freq: Every day | ORAL | Status: DC
  Administered 2011-04-02 – 2011-04-04 (×2): 20 mg via ORAL
  Filled 2011-03-31 (×4): qty 1

## 2011-03-31 MED ORDER — SCOPOLAMINE 1 MG/3DAYS TD PT72
1.0000 | MEDICATED_PATCH | TRANSDERMAL | Status: DC
  Filled 2011-03-31: qty 1

## 2011-03-31 MED ORDER — LISINOPRIL-HYDROCHLOROTHIAZIDE 20-12.5 MG PO TABS: 1.0000 | ORAL_TABLET | Freq: Every day | ORAL | Status: DC

## 2011-03-31 MED ORDER — MORPHINE SULFATE (PF) 1 MG/ML IV SOLN
INTRAVENOUS | Status: AC
  Administered 2011-03-31: 22.33 mg via INTRAVENOUS
  Filled 2011-03-31: qty 25

## 2011-03-31 MED ORDER — ALUM & MAG HYDROXIDE-SIMETH 400-400-40 MG/5ML PO SUSP: 30.0000 mL | Freq: Four times a day (QID) | ORAL | Status: DC | PRN

## 2011-03-31 MED ORDER — DIPHENHYDRAMINE HCL 50 MG/ML IJ SOLN: 12.5000 mg | Freq: Four times a day (QID) | INTRAMUSCULAR | Status: DC | PRN

## 2011-03-31 MED ORDER — PHENYLEPHRINE HCL 10 MG/ML IJ SOLN
INTRAMUSCULAR | Status: DC | PRN
  Administered 2011-03-31: 80 ug via INTRAVENOUS
  Administered 2011-03-31: 160 ug via INTRAVENOUS
  Administered 2011-03-31 (×2): 80 ug via INTRAVENOUS

## 2011-03-31 MED ORDER — HEMOSTATIC AGENTS (NO CHARGE) OPTIME: TOPICAL | Status: DC | PRN

## 2011-03-31 MED ORDER — PANTOPRAZOLE SODIUM 40 MG IV SOLR
40.0000 mg | Freq: Every day | INTRAVENOUS | Status: DC
  Administered 2011-03-31 – 2011-04-01 (×2): 40 mg via INTRAVENOUS
  Filled 2011-03-31 (×3): qty 40

## 2011-03-31 MED ORDER — DIPHENHYDRAMINE HCL 12.5 MG/5ML PO ELIX: 12.5000 mg | ORAL_SOLUTION | Freq: Four times a day (QID) | ORAL | Status: DC | PRN

## 2011-03-31 MED ORDER — BUPIVACAINE-EPINEPHRINE PF 0.25-1:200000 % IJ SOLN
INTRAMUSCULAR | Status: DC | PRN
  Administered 2011-03-31: 18 mL

## 2011-03-31 SURGICAL SUPPLY — 66 items
ADH SKN CLS APL DERMABOND .7 (GAUZE/BANDAGES/DRESSINGS) ×1
BLADE SURG ROTATE 9660 (MISCELLANEOUS) IMPLANT
BUR ROUND FLUTED 4 SOFT TCH (BURR) ×2 IMPLANT
CAGE BULLET CONCORDE 9X10X27 (Cage) ×1 IMPLANT
CAP MONARCH TYPHOON (Orthopedic Implant) ×1 IMPLANT
CLOTH BEACON ORANGE TIMEOUT ST (SAFETY) ×2 IMPLANT
CORDS BIPOLAR (ELECTRODE) ×2 IMPLANT
COVER MAYO STAND STRL (DRAPES) ×3 IMPLANT
COVER SURGICAL LIGHT HANDLE (MISCELLANEOUS) ×2 IMPLANT
DERMABOND ADVANCED (GAUZE/BANDAGES/DRESSINGS) ×1
DERMABOND ADVANCED .7 DNX12 (GAUZE/BANDAGES/DRESSINGS) ×1 IMPLANT
DRAPE C-ARM 42X72 X-RAY (DRAPES) ×3 IMPLANT
DRAPE SURG 17X23 STRL (DRAPES) ×6 IMPLANT
DRAPE TABLE COVER HEAVY DUTY (DRAPES) ×2 IMPLANT
DRSG MEPILEX BORDER 4X4 (GAUZE/BANDAGES/DRESSINGS) ×1 IMPLANT
DRSG MEPILEX BORDER 4X8 (GAUZE/BANDAGES/DRESSINGS) ×1 IMPLANT
DURAPREP 26ML APPLICATOR (WOUND CARE) ×2 IMPLANT
ELECT BLADE 6.5 EXT (BLADE) IMPLANT
ELECT CAUTERY BLADE 6.4 (BLADE) ×2 IMPLANT
ELECT REM PT RETURN 9FT ADLT (ELECTROSURGICAL) ×2
ELECTRODE REM PT RTRN 9FT ADLT (ELECTROSURGICAL) ×1 IMPLANT
EVACUATOR 1/8 PVC DRAIN (DRAIN) IMPLANT
GLOVE BIO SURGEON STRL SZ 6.5 (GLOVE) ×1 IMPLANT
GLOVE BIOGEL PI IND STRL 6.5 (GLOVE) IMPLANT
GLOVE BIOGEL PI IND STRL 7.5 (GLOVE) ×1 IMPLANT
GLOVE BIOGEL PI INDICATOR 6.5 (GLOVE) ×1
GLOVE BIOGEL PI INDICATOR 7.5 (GLOVE) ×1
GLOVE ECLIPSE 7.0 STRL STRAW (GLOVE) ×2 IMPLANT
GLOVE ECLIPSE 8.5 STRL (GLOVE) ×2 IMPLANT
GLOVE SURG 8.5 LATEX PF (GLOVE) ×2 IMPLANT
GOWN PREVENTION PLUS LG XLONG (DISPOSABLE) ×1 IMPLANT
GOWN STRL NON-REIN LRG LVL3 (GOWN DISPOSABLE) ×3 IMPLANT
GOWN STRL REIN 2XL LVL4 (GOWN DISPOSABLE) ×2 IMPLANT
HEALOS II DOUBLE STRIP 10CC (Peek) ×1 IMPLANT
HEMOSTAT SURGICEL 2X14 (HEMOSTASIS) IMPLANT
KIT BASIN OR (CUSTOM PROCEDURE TRAY) ×2 IMPLANT
KIT ROOM TURNOVER OR (KITS) ×2 IMPLANT
NDL ASP BONE MRW 11GX15 (NEEDLE) IMPLANT
NDL SPNL 18GX3.5 QUINCKE PK (NEEDLE) ×1 IMPLANT
NEEDLE 22X1 1/2 (OR ONLY) (NEEDLE) ×2 IMPLANT
NEEDLE ASP BONE MRW 11GX15 (NEEDLE) ×4 IMPLANT
NEEDLE BONE MARROW 8GAX6 (NEEDLE) IMPLANT
NEEDLE SPNL 18GX3.5 QUINCKE PK (NEEDLE) ×2 IMPLANT
NS IRRIG 1000ML POUR BTL (IV SOLUTION) ×2 IMPLANT
PACK LAMINECTOMY ORTHO (CUSTOM PROCEDURE TRAY) ×2 IMPLANT
PAD ARMBOARD 7.5X6 YLW CONV (MISCELLANEOUS) ×4 IMPLANT
PATTIES SURGICAL .75X.75 (GAUZE/BANDAGES/DRESSINGS) ×2 IMPLANT
ROD PREBENT EXPEDIUM 6.35X65MM (Rod) ×1 IMPLANT
SCREW EXPEDIUM POLY 6.35 6X45 (Screw) ×1 IMPLANT
SCREW SET EXPEDIUM 6.35 (Screw) ×1 IMPLANT
SPONGE LAP 4X18 X RAY DECT (DISPOSABLE) ×2 IMPLANT
SUT VIC AB 0 CT1 27 (SUTURE) ×2
SUT VIC AB 0 CT1 27XBRD ANBCTR (SUTURE) ×1 IMPLANT
SUT VIC AB 1 CTX 36 (SUTURE) ×4
SUT VIC AB 1 CTX36XBRD ANBCTR (SUTURE) ×2 IMPLANT
SUT VIC AB 2-0 CT1 27 (SUTURE) ×2
SUT VIC AB 2-0 CT1 TAPERPNT 27 (SUTURE) ×1 IMPLANT
SUT VICRYL 0 CT 1 36IN (SUTURE) ×4 IMPLANT
SUT VICRYL 4-0 PS2 18IN ABS (SUTURE) ×4 IMPLANT
SYR 20ML ECCENTRIC (SYRINGE) ×1 IMPLANT
SYR CONTROL 10ML LL (SYRINGE) ×4 IMPLANT
TOWEL OR 17X24 6PK STRL BLUE (TOWEL DISPOSABLE) ×2 IMPLANT
TOWEL OR 17X26 10 PK STRL BLUE (TOWEL DISPOSABLE) ×2 IMPLANT
TRAY FOLEY CATH 14FR (SET/KITS/TRAYS/PACK) ×2 IMPLANT
WATER STERILE IRR 1000ML POUR (IV SOLUTION) ×2 IMPLANT
YANKAUER SUCT BULB TIP NO VENT (SUCTIONS) ×2 IMPLANT

## 2011-03-31 NOTE — Anesthesia Preprocedure Evaluation (Addendum)
Anesthesia Evaluation  Patient identified by MRN, date of birth, ID band Patient awake    Reviewed: Allergy & Precautions, H&P , NPO status , Patient's Chart, lab work & pertinent test results  History of Anesthesia Complications (+) PONV and Family history of anesthesia reaction  Airway Mallampati: II TM Distance: >3 FB Neck ROM: full  Mouth opening: Limited Mouth Opening  Dental  (+) Teeth Intact and Dental Advidsory Given   Pulmonary          Cardiovascular hypertension, Pt. on medications regular Normal    Neuro/Psych  Neuromuscular disease    GI/Hepatic   Endo/Other  Hypothyroidism   Renal/GU      Musculoskeletal   Abdominal   Peds  Hematology   Anesthesia Other Findings   Reproductive/Obstetrics                          Anesthesia Physical Anesthesia Plan  ASA: II  Anesthesia Plan: General   Post-op Pain Management:    Induction: Intravenous  Airway Management Planned: Oral ETT  Additional Equipment:   Intra-op Plan:   Post-operative Plan: Extubation in OR  Informed Consent: I have reviewed the patients History and Physical, chart, labs and discussed the procedure including the risks, benefits and alternatives for the proposed anesthesia with the patient or authorized representative who has indicated his/her understanding and acceptance.     Plan Discussed with: CRNA and Surgeon  Anesthesia Plan Comments:        Anesthesia Quick Evaluation

## 2011-03-31 NOTE — H&P (Signed)
Lindsay Aguirre is an 60 y.o. female.   Chief Complaint: Back and leg pain. HPI: This patient is a 60 year old female with past history of lumbar fusion at the L4-5 level for degenerative spondylolisthesis in 2009. Postoperatively noted to have a minimal spondylolisthesis at the L3-4 level. She has been treated conservatively with intermittent oral steroid medications as well as epidural steroid injection 5 days ago she experienced sudden increased pain in her back radiation into both lower extremities with weakness in both lower extremities. She was seen in my office days ago with severe sciatica involving both lower extremity wheelchair-bound. Her exam consistent with a significant worsening of the spondylolisthesis with bilateral lumbar radiculopathy. An MRI scan was ordered which demonstrated a large central disc herniation at the L3-4 level in addition to degenerative spondylolisthesis grade 2 at the L3-4 a L5 distribution. Patient is admitted to undergo decompression at the L3-4 level with extension of her fusion to the L3-4 revision of raw and new pedicle screws at the L3 level with TLIF at L3-4. No bowel or bladder. Her pain is severe 10 out of 10 on relief with use narcotic medicine. She is unable to stand or ambulate any distance greater than 100 feet.  Past Medical History  Diagnosis Date  . PONV (postoperative nausea and vomiting)   . Hypertension   . Hypothyroidism   . Arthritis   . Gout   . Neuromuscular disorder     Past Surgical History  Procedure Date  . Cervical fusion '99 & '03  . Back surgery     2009  . Rotator cuff surgery '08    right  . Cataract extraction w/phaco 12/13/2010    Procedure: CATARACT EXTRACTION PHACO AND INTRAOCULAR LENS PLACEMENT (IOC);  Surgeon: Gemma Payor;  Location: [facility];  Service: Ophthalmology;  Laterality: Right;  CDE: 8.94    Family History  Problem Relation Age of Onset  . Anesthesia problems Neg Hx   . Hypotension Neg Hx   . Malignant  hyperthermia Neg Hx   . Pseudochol deficiency Neg Hx    Social History:  reports that she has never smoked. She does not have any smokeless tobacco history on file. She reports that she does not drink alcohol or use illicit drugs.  Allergies: No Known Allergies  Medications Prior to Admission  Medication Dose Route Frequency Provider Last Rate Last Dose  . ceFAZolin (ANCEF) IVPB 2 g/50 mL premix  2 g Intravenous 60 min Pre-Op Kerrin Champagne, MD      . lactated ringers infusion   Intravenous Continuous Aubery Lapping, MD 50 mL/hr at 03/31/11 1235    . scopolamine (TRANSDERM-SCOP) 1.5 MG 1.5 mg  1 patch Transdermal Q72H Aubery Lapping, MD       Medications Prior to Admission  Medication Sig Dispense Refill  . allopurinol (ZYLOPRIM) 300 MG tablet Take 300 mg by mouth daily.        . B Complex-C (SUPER B COMPLEX) TABS Take 1 tablet by mouth daily.        . Black Cohosh 40 MG CAPS Take 1 capsule by mouth 2 (two) times daily.        . calcium-vitamin D (OSCAL WITH D) 500-200 MG-UNIT per tablet Take 1 tablet by mouth 2 (two) times daily.        . Cholecalciferol (VITAMIN D3) 2000 UNITS TABS Take 1 tablet by mouth daily.        . diclofenac (VOLTAREN) 50 MG EC tablet Take  50 mg by mouth 2 (two) times daily.        [name] HYDROcodone-acetaminophen (VICODIN) 5-500 MG per tablet Take 1 tablet by mouth every 4 (four) hours as needed. States every 4-6 hrs prn pain       . levothyroxine (SYNTHROID, LEVOTHROID) 88 MCG tablet Take 88 mcg by mouth daily.        [name] lisinopril-hydrochlorothiazide (PRINZIDE,ZESTORETIC) 20-12.5 MG per tablet Take 1 tablet by mouth daily.        . vitamin C (ASCORBIC ACID) 500 MG tablet Take 500 mg by mouth daily.          Results for orders placed during the hospital encounter of 03/30/11 (from the past 48 hour(s))  CBC     Status: Abnormal   Collection Time   03/30/11  1:48 PM      Component Value Range Comment   WBC 11.0 (*) 4.0 - 10.5 (K/uL)    RBC 4.09  3.87 - 5.11  (MIL/uL)    Hemoglobin 13.5  12.0 - 15.0 (g/dL)    HCT 16.1  09.6 - 04.5 (%)    MCV 96.1  78.0 - 100.0 (fL)    MCH 33.0  26.0 - 34.0 (pg)    MCHC 34.4  30.0 - 36.0 (g/dL)    RDW 40.9  81.1 - 91.4 (%)    Platelets 280  150 - 400 (K/uL)   COMPREHENSIVE METABOLIC PANEL     Status: Abnormal   Collection Time   03/30/11  1:48 PM      Component Value Range Comment   Sodium 137  135 - 145 (mEq/L)    Potassium 3.4 (*) 3.5 - 5.1 (mEq/L)    Chloride 96  96 - 112 (mEq/L)    CO2 30  19 - 32 (mEq/L)    Glucose, Bld 107 (*) 70 - 99 (mg/dL)    BUN 25 (*) 6 - 23 (mg/dL)    Creatinine, Ser 7.82  0.50 - 1.10 (mg/dL)    Calcium 95.6  8.4 - 10.5 (mg/dL)    Total Protein 7.4  6.0 - 8.3 (g/dL)    Albumin 4.2  3.5 - 5.2 (g/dL)    AST 24  0 - 37 (U/L)    ALT 25  0 - 35 (U/L)    Alkaline Phosphatase 68  39 - 117 (U/L)    Total Bilirubin 0.5  0.3 - 1.2 (mg/dL)    GFR calc non Af Amer 68 (*) >90 (mL/min)    GFR calc Af Amer 78 (*) >90 (mL/min)   DIFFERENTIAL     Status: Abnormal   Collection Time   03/30/11  1:48 PM      Component Value Range Comment   Neutrophils Relative 77  43 - 77 (%)    Neutro Abs 8.5 (*) 1.7 - 7.7 (K/uL)    Lymphocytes Relative 16  12 - 46 (%)    Lymphs Abs 1.8  0.7 - 4.0 (K/uL)    Monocytes Relative 6  3 - 12 (%)    Monocytes Absolute 0.6  0.1 - 1.0 (K/uL)    Eosinophils Relative 1  0 - 5 (%)    Eosinophils Absolute 0.1  0.0 - 0.7 (K/uL)    Basophils Relative 0  0 - 1 (%)    Basophils Absolute 0.0  0.0 - 0.1 (K/uL)   URINALYSIS, ROUTINE W REFLEX MICROSCOPIC     Status: Abnormal   Collection Time   03/30/11  1:49 PM  Component Value Range Comment   Color, Urine YELLOW  YELLOW     APPearance CLOUDY (*) CLEAR     Specific Gravity, Urine 1.020  1.005 - 1.030     pH 7.0  5.0 - 8.0     Glucose, UA NEGATIVE  NEGATIVE (mg/dL)    Hgb urine dipstick NEGATIVE  NEGATIVE     Bilirubin Urine NEGATIVE  NEGATIVE     Ketones, ur NEGATIVE  NEGATIVE (mg/dL)    Protein, ur NEGATIVE   NEGATIVE (mg/dL)    Urobilinogen, UA 0.2  0.0 - 1.0 (mg/dL)    Nitrite NEGATIVE  NEGATIVE     Leukocytes, UA NEGATIVE  NEGATIVE  MICROSCOPIC NOT DONE ON URINES WITH NEGATIVE PROTEIN, BLOOD, LEUKOCYTES, NITRITE, OR GLUCOSE <1000 mg/dL.  SURGICAL PCR SCREEN     Status: Abnormal   Collection Time   03/30/11  1:49 PM      Component Value Range Comment   MRSA, PCR NEGATIVE  NEGATIVE     Staphylococcus aureus POSITIVE (*) NEGATIVE    TYPE AND SCREEN     Status: Normal   Collection Time   03/30/11  2:05 PM      Component Value Range Comment   ABO/RH(D) O POS      Antibody Screen NEG      Sample Expiration 04/13/2011      Dg Chest 2 View  03/30/2011  *RADIOLOGY REPORT*  Clinical Data: Preoperative chest x-ray for back surgery.  CHEST - 2 VIEW  Comparison: Chest x-ray 03/20/2008.  Findings: The cardiac silhouette, mediastinal and hilar contours are within normal limits and stable.  There is mild tortuosity of the thoracic aorta.  The lungs are clear.  No pleural effusion. The bony thorax is intact.  The cervical fusion hardware is noted incidentally.  IMPRESSION: No acute cardiopulmonary findings.  No change since prior examination.  Original Report Authenticated By: P. Loralie Champagne, M.D.   Mr Lumbar Spine W Wo Contrast  03/29/2011  *RADIOLOGY REPORT*  Clinical Data: Left hip buttock and upper leg pain.  Weakness.  BUN and creatinine were obtained on site at [facility] at [address] Results:  BUN 26 mg/dL,  Creatinine 1.0 mg/dL.  MRI LUMBAR SPINE WITHOUT AND WITH CONTRAST  Technique:  Multiplanar and multiecho pulse sequences of the lumbar spine were obtained without and with intravenous contrast.  Contrast: 18mL MULTIHANCE GADOBENATE DIMEGLUMINE 529 MG/ML IV SOLN  Comparison: Intraoperative image dated 03/13/2008  Findings: Scan extends from T11-12 through S3.  Tip of the conus is at L1-2 and appears normal. Paraspinal soft tissues are normal.  T11-12 through L1-2:  Normal.   L2-3:  Small diffuse disc bulge with a focal disc protrusion into the left neural foramen and slightly lateral to it.  However, the left L2 nerve exits without impingement and the thecal sac is not compressed.  L3-4: Prominent  broad-based disc protrusion with an extruded 12 x 10 x 8 mm disc fragment extending superiorly behind the body of L3 in the left lateral recess compressing the left L3 nerve roots. The broad-based protrusion extends into the right neural foramen and causes slight right foraminal stenosis.  There is marked hypertrophy of the left facet joint with protrusion of the left ligamentum flavum into the spinal canal compressing the left side of the thecal sac which should affect the left L4 and L5 nerve roots. There is also moderate arthritis of the right facet joint with bilateral joint effusions.  The spinous process of  L3 has been resected.  L4-5:  Interbody and posterior fusion with excellent decompression of the thecal sac and nerve roots.  L5 S1:  Marked disc space narrowing with a tiny broad-based disc bulge with accompanying osteophytes.  However, both L5 nerves appear to exit without impingement.  No facet arthritis.  IMPRESSION:  1.  Large broad-based soft disc protrusion at L3-4 with an extruded free fragment extending superiorly in the left lateral recess as well as displaced left ligamentum flavum and hypertrophied left facet compressing the left side of the thecal sac and left-sided nerve roots.  These findings should affect the left L3, L4, and L5 nerve roots.  2.  Small left far lateral disc protrusion at L2-3 without discrete impingement upon the exiting L2 nerve.  Original Report Authenticated By: Gwynn Burly, M.D.    ROS her review of systems 11 physical systems reviewed are negative for upper expiratory tract infection GI complaints or GU complaints. She is experiencing numbness and paresthesias in both lower extremities and L4-L5 distribution  Blood pressure 129/89,  pulse 87, temperature 98.5 F (36.9 C), temperature source Oral, resp. rate 18, SpO2 96.00%.  Physical Exam  Alert oriented x4 well-developed moderately obese 60 year old female.  Pupils equal and reactive to light and accommodation extraocular muscle movements are intact. Mouth is clear ears without abnormality. Neck supple with decreased range of motion by about 20% either side and forward bending. No thyromegaly no masses felt no carotid bruits.  Chest is clear to auscultation and percussion.   Cor regular rate and rhythm no murmur gallop or rub.  Abdomen soft nontender normal bowel sounds no organomegaly and no masses. GU normal female referred to primary care.  Breast exam deferred to primary care. Extremity evaluation showed normal motor and sensory both upper cavities normal pulses brisk in both upper and lower extremities bilateral lower a grade 5 minus over 5 weakness of her knee extension weakness last saw a grade 5 minus over 5. Positive sciatic stretch signs both lower extremity at about 30. Complaining of severe back pain.  Assessment/Plan Lumbar painful spondylolisthesis degenerative type L3-4 with acute large central disc herniation L3-4 above a previous L4-5 fusion for spondylolisthesis.  This patient is to be transported to the operating room to undergo decompression of the L3-4 disc herniation with transforaminal lumbar interbody fusion and posterior lateral fusion extended to the L3-4 level and revision of the rods at the L4-5 level to extend to the L3 with new screws at the L3 level. The risks of surgeries including infection bleeding neurologic compromise have been discussed with this patient and she wishes to proceed with the operation.  Delrae Hagey E 03/31/2011, 1:05 PM

## 2011-03-31 NOTE — Preoperative (Signed)
Beta Blockers   Reason not to administer Beta Blockers:Not Applicable 

## 2011-03-31 NOTE — Transfer of Care (Signed)
Immediate Anesthesia Transfer of Care Note  Patient: Tayla Panozzo Weightman  Procedure(s) Performed:  POSTERIOR LUMBAR FUSION 2 WITH HARDWARE REMOVAL - L3-4 Central Laminectomy with L3-4 TLIF, Remove hardware L4-5, Extend L3 to L5, local bone graft  Patient Location: PACU  Anesthesia Type: General  Level of Consciousness: awake, alert , oriented and patient cooperative  Airway & Oxygen Therapy: Patient Spontanous Breathing and Patient connected to face mask oxygen  Post-op Assessment: Report given to PACU RN  Post vital signs: Reviewed and stable  Complications: No apparent anesthesia complications

## 2011-03-31 NOTE — Anesthesia Procedure Notes (Signed)
Procedure Name: Intubation Date/Time: 03/31/2011 1:17 PM Performed by: Carmela Rima Pre-anesthesia Checklist: Emergency Drugs available, Patient identified, Timeout performed, Suction available and Patient being monitored Patient Re-evaluated:Patient Re-evaluated prior to inductionOxygen Delivery Method: Circle System Utilized Preoxygenation: Pre-oxygenation with 100% oxygen Intubation Type: IV induction Ventilation: Mask ventilation without difficulty Laryngoscope Size: Mac and 3 Grade View: Grade I Tube type: Oral Tube size: 7.5 mm Number of attempts: 1 Placement Confirmation: ETT inserted through vocal cords under direct vision,  breath sounds checked- equal and bilateral,  positive ETCO2 and CO2 detector Secured at: 23 cm Tube secured with: Tape Dental Injury: Teeth and Oropharynx as per pre-operative assessment

## 2011-03-31 NOTE — Op Note (Signed)
03/31/2011  5:16 PM  PATIENT:  Lindsay Aguirre  60 y.o. female  MRN: [id number] OPERATIVE REPORT   PRE-OPERATIVE DIAGNOSIS:  L3-4 spondylolisthesis Grade II painful with Left L3 radiculopathy  POST-OPERATIVE DIAGNOSIS:  L3-4 spondylolisthesis Grade II painful with Left L3 radiculopathy  PROCEDURE:  Procedure(s): POSTERIOR LUMBAR FUSION L3-4, transforaminal lumbar interbody fusion but L3-4 with local bone graft a 10 mm lordotic cord cage. HARDWARE REMOVAL bilateral rods at the L4-5 level. Insertion of bilateral L3 pedicle screws and new 65 mm precontoured rod from L3-L5 bilateral. Aspiration of bone marrow left transpedicular L3 vertebral body. Charging of Healos calcium oxalate sponge a 10 cc.   OPERATIVE REPORT     SURGEON:  Kerrin Champagne, MD      ASSISTANT:  Maud Deed, PA-C  (Present throughout the entire procedure     and necessary for completion of procedure in a timely manner)      ANESTHESIA:  General,     COMPLICATIONS:  None.      COMPONENTS: 2 x 45 mmx 6 mm expedium pedicle screws, 2 x 65 mm precontoured rods.  PROCEDURE: The patient was met in the holding area, and the appropriate lumbar level and some identified and marked with an x and my initials.The patient was then transported to OR. The patient was then placed under  general anesthesia without difficulty.The patient received appropriate preoperative antibiotic prophylaxis.  . Nursing staff inserted a Foley catheter under sterile conditions. She was then turned to a prone position [place] spine was used for this case. All pressure points were well padded PAS stocking applied bilateral lower extremity to prevent DVT. Standard prep DuraPrep solution. Draped in the usual manner. Time-out procedure was called and correct .  The old incision scar was ellipsed L2-S1 and carried superiorly an additional one level to the L1 spinous process and the incision carried superiorly an additional one level to the L1 spinous  process.  Bovie electric cautery was used to control bleeding and carefully dissection was carried down along the lateral aspects of the spinous process of L2 and L5 S1. Cobb then used to carefully elevate the paralumbar muscle and the incision in the midline was carried to to the level of the base of the residual spinous processes. The laminotomy area extending from the base of the spinous process of L2 to the superior aspect of L5 was carried expose at its edges debrided the scar tissue using a large curette. A Penfield 4 was inserted into the left L3-4 facet and intraoperative lateral confirmation of the appropriate level with C-arm sterilely draped. Osteotome was then used to resect the inferior articular process of L3 removing with 3 mm Kerrison and Penfield 4 and pituitary rongeur. Carefully the medial aspect of the superior tip of process of L4 was freed up of scar tissue off of the adjacent thecal sac. Osteotome used to perform osteotomy of the superior articular process on the left side at L4 decompressing the lateral recess. This was done using increased technique and resected using a 2 mm Kerrison and curettes residual bone remaining in the lateral recess was resected using 2 mm Kerrison implant fixation implant used during this. Loupe magnification and headlight were used for this portion of the procedure. Residual pars interarticularis overlying the left L3 nerve frame was then resected using 2 and 3 mm Kerrisons. The left L3 nerve root well decompressed. Hockey-stick nerve probe was it will be passed out the left L3 nerve frame and identifying the superior  aspect of the L4 pedicle then osteotome used to resect the superior articular process of L4 transversely at the superior level of the L4 pedicle. This allowed for a wide decompression left L3 neuroforamen. L3 nerve roots and lateral aspect of the thecal sac at the L3-4 level was then able to be mobilized and Penfield 4 disc space the L3-4 level  identified and the nerve structures retracted trachea retractors. 15 blade scalpel used to incise the disc the left side. Pituitary rongeur and used to debride degenerative spur on the disc space the left side. A synovial cyst is noted along the margin of the medial aspect of the L3-4 facet and this was resected prior to the performance of the discectomy on the left side. Following debridement of degenerative disc material from the left side L3-4 disc, the disc space was dilated using 7 mm through 11 mm disc space dilators. A laminar spreader was inserted between the spinous process  base L2 and the left L5 pedicle screw fastener excellent distraction of the disc space was obtained. The disc space was then prepared using straight curette up-biting right and up-biting left curette and ring curettes. Degenerated disc as well as a cartilage endplates Z. debris from the displaced using pituitary rongeurs. The disc space examined demonstrating good bleeding endplate bone surfaces. Bone graft that was harvested from the facet was placed into the intervertebral disc space after first sounding the disc space for the correct cage size. Cage chosen was a 10 mm Concorde lordotic cage. Additional bone graft was harvested from the old posterior lateral fusion areas on both sides while freeing up the pedicle fasteners and raw from posterior lateral fusion. The vascular Were Removed at Both the L4 and L5 Levels and the Rods Removed from the L4-5 Level Bilaterally. Each of the Posterior Fasteners Were Then Carefully Loosened Using the [facility].  Local bone graft was then impacted into the intervertebral disc space at L3-4 first placing within the disc space using a forceps then impacted with an 8 mm trial cage. After performing this 3 times stem the permanent 10 mm Concorde lordotic cage packed with bone graft was then inserted in approximately 15-20 of convergence. Careful inspection of the spinal canal demonstrated no  surgery bone graft within the spinal canal both the L4 and L3 nerve roots appeared to exit without further there is no evidence disc herniation of the posterior aspect of the space this so that it is felt that the disc herniation noted on preoperative studies likely represent a pseudo-disc herniation with L4 nerve entrapment on the left side and the lateral recess at L3-4 secondary to spondylolisthesis. C-arm images were used to confirm the trial size 4 the permanent cage implant as well as positioning of the implant well within the intervertebral disc this cage was subsequently the posterior aspect of the displaced about 3-4 mm. Attention then turned to performance of insertion of pedicle screws the L3 level.   Normal was then used to make an entry point into the intersection of the lateral aspect of the L3 pedicle with left L3 transverse process observed on C-arm fluoroscopy to be in good position alignment with the pedicle of L3 observed on lateral view. Handle held pedicle probe was then used to make an opening into the central portions of the pedicle of L3 on the left side. C-arm fluoroscopy used to verify the position alignment of the pedicle probe depth at 45 mm. Trocar used for aspiration of bone marrow was then  inserted after first checking the pedicle opening using a ball-tipped probe. Ball-tipped probe indicated that there was no broaching of the cortex within the pedicle opening on the left L3 aspiration equipment was then placed and 10 cc of bone marrow aspirate obtained and was used to charge a 10 cc strip of the Heilos calcium oxalate charge collagen sponge. This was then divided into sections similar to tooth picks for posterior lateral bone graft purposes. Decortication of the transverse process of the 3 and the superior and lateral aspects of the posterolateral fusion mass at the L4 level was then carried out using a high-speed bur. The bone marrow charged heilos was applied to the area between  the transverse process of L3 and the posterior lateral fusion mass at L4. Ball-tipped probe was then used to probe the channel placed on the left side at L3 pedicle again showing no broaching cortex. The 45 mm x 6 pedicle screw was then inserted at the left L3 level in the appropriate degree of convergence and lordosis. A 65 mm precontoured rod was then carefully inserted into the fasteners extending from L3-L5 on the left side. The fastener caps at L4 and L5 level was then placed and torqued to 80 foot-pounds. Compression between the fasteners L3 and L4 was then performed and the cath at the L3 level was tightened to 80 foot pounds. Attention then turned to the placement of the pedicle screw on the right side at L3 this was done similar to the left side. Awl used to make an initial entry point verified with C-arm fluoroscopy then a hand-held pedicle probe used to probe the pedicle channel, checked with a ball-tip probe and verify no broaching of cortex. Pedicle probe of L3 noted on C-arm fluoroscopy to be in the correct degree of lordosis centered within the pedicle and a length of about 45 mm screw. Tapping was then performed using is 5 mm tap again checking the opening within the pedicle with a pedicle probe to ensure no broaching cortex. 6 mm x 45 mm pedicle screw Depuy Expedium type was then inserted into the right L3 pedicle after first decorticating the transverse process of L3 and the posterior lateral mass on the right side at L4. The bone marrow charged Heilos sponge was then inserted between the transverse process of L3 and right and the upper aspect of the posterior lateral fusion mass of L4. Pedicle screw inserted in excellent position alignment observed with C-arm fluoroscopy. A 65 mm rod was then inserted into the fasteners extending from L3-L5 the right side. Fastener caps then applied to the L4 and L5 pedicle screw fasteners and these were tightened today he foot-pounds. And these were tightened to  80 foot-pounds. The fastener cap at the L3 level was then inserted and compression obtained between the fastener at L3 and at L4 and the Tightened to 80 foot-pounds. Irrigation was carried out using copious amounts of irrigant solution. Thrombin-soaked Gelfoam were placed for hemostasis was then carefully removed. Permanent C-arm images were obtained in [facility] oblique and lateral positions for documentation purposes. They showed the pedicle screws rods to be in good position alignment from L3-L5. Spondylolisthesis at L3-4 was reduced. Cell Saver was used during this case however there is no significant blood available for return to the patient. Estimated blood loss was 350 cc. A medium Hemovac drain was placed in the left lower lumbar spine and the midline incision away from neural structures the deep paralumbar muscles with her approximated with 1  Vicryl sutures, the lumbodorsal fascia approximated with 0 and 1 Vicryl sutures. Subcutaneous layers were then approximated with interrupted 0 and 2-0 Vicryl sutures , skin closed with a running subcuticular 4-0 Vicryl suture. The bone was then applied. Mepilex bandage was then applied. The exiting Hemovac drain site and carefully bandage. All instrument and sponge counts were correct. Patient was then returned to the supine position reactivated extubated and returned to the recovery room in satisfactory condition.    Natalin Bible E  03/31/2011, 5:16 PM

## 2011-03-31 NOTE — Brief Op Note (Signed)
03/31/2011  5:06 PM  PATIENT:  Lindsay Aguirre  60 y.o. female  PRE-OPERATIVE DIAGNOSIS:  L3-4 spondylolisthesis Grade II painful with Left L3 radiculopathy new L3-4 herniated nucleus pulposus.  POST-OPERATIVE DIAGNOSIS:  L3-4 spondylolisthesis Grade II painful with Left L3 radiculopathy synovial cyst left L3-4 facet  PROCEDURE:  Procedure(s): POSTERIOR LUMBAR FUSION L3-4, transforaminal lumbar interbody fusion left side L3-4 with 10 mm Concorde lordotic cage and local bone graft.  HARDWARE REMOVAL L4-5 rods. New pedicle screws placed at the L3 level bilaterally. 65 mm precontoured rod bilateral L3-L5. SURGEON:  Surgeon(s): Kerrin Champagne, MD  PHYSICIAN ASSISTANT: Maud Deed PA-C     ANESTHESIA:   local and spinal  EBL:  Total I/O In: 2000 [I.V.:2000] Out: 650 [Urine:300; Blood:350]  BLOOD ADMINISTERED:0 CC CELLSAVER  DRAINS: (1) Hemovact drain(s) in the Left lower lumbar with  Suction Open and Urinary Catheter (Foley)   LOCAL MEDICATIONS USED:  MARCAINE 20 CC  SPECIMEN:  No Specimen  DISPOSITION OF SPECIMEN:  N/A  COUNTS:  YES  TOURNIQUET:  * No tourniquets in log *  DICTATION: .Dragon Dictation  PLAN OF CARE: Admit to inpatient   PATIENT DISPOSITION:  PACU - hemodynamically stable.   Delay start of Pharmacological VTE agent (>24hrs) due to surgical blood loss or risk of bleeding:  {YES/NO/NOT APPLICABLE:20182

## 2011-03-31 NOTE — Anesthesia Postprocedure Evaluation (Signed)
  Anesthesia Post-op Note  Patient: Lindsay Aguirre  Procedure(s) Performed:  POSTERIOR LUMBAR FUSION 2 WITH HARDWARE REMOVAL - L3-4 Central Laminectomy with L3-4 TLIF, Remove hardware L4-5, Extend L3 to L5, local bone graft  Patient Location: PACU  Anesthesia Type: General  Level of Consciousness: awake  Airway and Oxygen Therapy: Patient connected to nasal cannula oxygen  Post-op Pain: severe  Post-op Assessment: Post-op Vital signs reviewed  Post-op Vital Signs: stable  Complications: No apparent anesthesia complications

## 2011-04-01 LAB — BASIC METABOLIC PANEL
BUN: 21 mg/dL (ref 6–23)
Calcium: 8.6 mg/dL (ref 8.4–10.5)
GFR calc Af Amer: 86 mL/min — ABNORMAL LOW (ref >90)
GFR calc non Af Amer: 75 mL/min — ABNORMAL LOW (ref >90)
Glucose, Bld: 106 mg/dL — ABNORMAL HIGH (ref 70–99)

## 2011-04-01 LAB — CBC
HCT: 27.8 % — ABNORMAL LOW (ref 36.0–46.0)
Hemoglobin: 9.5 g/dL — ABNORMAL LOW (ref 12.0–15.0)
MCH: 33.2 pg (ref 26.0–34.0)
MCHC: 34.2 g/dL (ref 30.0–36.0)
RDW: 13.9 % (ref 11.5–15.5)

## 2011-04-01 NOTE — Progress Notes (Signed)
Subjective: 1 Day Post-Op Procedure(s) (LRB): POSTERIOR LUMBAR FUSION 2 WITH HARDWARE REMOVAL (N/A) Patient reports pain as mild.  Leg pain relieved.  Nausea last night has resolved.  Has flatus. Ready for PT.  Pt has brace from previous surgery and will not need a new one. Will cancel order  Objective: Vital signs in last 24 hours: Temp:  [97.2 F (36.2 C)-98.7 F (37.1 C)] 98.7 F (37.1 C) (11/30 0520) Pulse Rate:  [72-87] 72  (11/30 0520) Resp:  [16-20] 18  (11/30 0520) BP: (92-129)/(45-89) 92/45 mmHg (11/30 0520) SpO2:  [94 %-97 %] 96 % (11/30 0520) Weight:  [87.4 kg (192 lb 10.9 oz)] 192 lb 10.9 oz (87.4 kg) (11/29 2030)  Intake/Output from previous day: 11/29 0701 - 11/30 0700 In: 2720 [P.O.:120; I.V.:2600] Out: 1500 [Urine:900; Drains:250; Blood:350] Intake/Output this shift:     Basename 04/01/11 0516 03/30/11 1348  HGB 9.5* 13.5    Basename 04/01/11 0516 03/30/11 1348  WBC 8.9 11.0*  RBC 2.86* 4.09  HCT 27.8* 39.3  PLT 204 280    Basename 04/01/11 0516 03/30/11 1348  NA 137 137  K 4.1 3.4*  CL 102 96  CO2 27 30  BUN 21 25*  CREATININE 0.83 0.90  GLUCOSE 106* 107*  CALCIUM 8.6 10.2   No results found for this basename: LABPT:2,INR:2 in the last 72 hours  ABD soft Neurovascular intact Sensation intact distally Intact pulses distally Dorsiflexion/Plantar flexion intact Incision: no drainage hemovac removed without difficulty  Assessment/Plan: 1 Day Post-Op Procedure(s) (LRB): POSTERIOR LUMBAR FUSION 2 WITH HARDWARE REMOVAL (N/A) Advance diet Up with therapy Discharge home with home health when stable Dc morphine:  Causing nausea Hold Lisinopril today. Cont IV fluids until BP improves  Ivionna Verley M 04/01/2011, 9:36 AM

## 2011-04-01 NOTE — Progress Notes (Signed)
Physical Therapy Evaluation Patient Details Name: Lindsay Aguirre MRN: [id number] DOB: 03-21-1951 Today's Date: 04/01/2011  Problem List:  Patient Active Problem List  Diagnoses  . Spondylolisthesis, grade 2    Past Medical History:  Past Medical History  Diagnosis Date  . PONV (postoperative nausea and vomiting)   . Hypertension   . Hypothyroidism   . Arthritis   . Gout   . Neuromuscular disorder    Past Surgical History:  Past Surgical History  Procedure Date  . Cervical fusion '99 & '03  . Back surgery     2009  . Rotator cuff surgery '08    right  . Cataract extraction w/phaco 12/13/2010    Procedure: CATARACT EXTRACTION PHACO AND INTRAOCULAR LENS PLACEMENT (IOC);  Surgeon: Gemma Payor;  Location: [facility];  Service: Ophthalmology;  Laterality: Right;  CDE: 8.94    PT Assessment/Plan/Recommendation PT Assessment Clinical Impression Statement: Pt is pleasant 60 y.o. female s/p L3-4 Central Laminectomy with L3-4 TLIF, Remove hardware L4-5, Extend L3 to L5, local bone graft. Currently, pt presents with generalized weakness and acute pain leading to difficutly with functional mobility and gait. Pt will benefit from silled PT to increase I with all mobility, adhering to back precautions in order to decrease caregiver burden and falls risk at home.  PT Recommendation/Assessment: Patient will need skilled PT in the acute care venue PT Problem List: Decreased strength;Decreased range of motion;Decreased activity tolerance;Decreased balance;Decreased mobility;Decreased knowledge of use of DME;Decreased knowledge of precautions;Pain PT Therapy Diagnosis : Difficulty walking;Generalized weakness;Acute pain PT Plan PT Frequency: Min 5X/week PT Treatment/Interventions: DME instruction;Gait training;Functional mobility training;Therapeutic exercise;Balance training;Neuromuscular re-education;Patient/family education PT Recommendation Follow Up Recommendations: Home health PT, 24 hour  Supervision/Assist Equipment Recommended: Rolling walker with 5" wheels PT Goals  Acute Rehab PT Goals PT Goal Formulation: With patient Time For Goal Achievement: 4 days Pt will Roll Supine to Right Side: with modified independence (With increased time) PT Goal: Rolling Supine to Right Side - Progress: Progressing toward goal Pt will go Supine/Side to Sit: with modified independence;with HOB 0 degrees (With increased time) PT Goal: Supine/Side to Sit - Progress: Progressing toward goal Pt will go Sit to Supine/Side: with modified independence;with HOB 0 degrees (With increased time) PT Goal: Sit to Supine/Side - Progress: Not met Pt will Transfer Sit to Stand/Stand to Sit: with modified independence;with upper extremity assist PT Transfer Goal: Sit to Stand/Stand to Sit - Progress: Progressing toward goal Pt will Ambulate: 51 - 150 feet;with supervision;with rolling walker PT Goal: Ambulate - Progress: Progressing toward goal  PT Evaluation Precautions/Restrictions  Precautions Precautions: Back Precaution Booklet Issued: No Required Braces or Orthoses: Yes Spinal Brace: Lumbar corset;Applied in sitting position Restrictions Weight Bearing Restrictions: No Prior Functioning  Home Living Lives With: Family Receives Help From: Family (Sister will be able to help 24hr) Type of Home: House Home Layout: One level Home Access: Level entry Bathroom Toilet: Standard Home Adaptive Equipment: None Prior Function Level of Independence: Independent with basic ADLs;Independent with homemaking with ambulation;Independent with gait;Independent with transfers Able to Take Stairs?: Yes Driving: No Vocation:  (Pt does not work) Producer, television/film/video: Awake/alert Overall Cognitive Status: Appears within functional limits for tasks assessed Orientation Level: Oriented X4 Sensation/Coordination Sensation Light Touch: Appears Intact Coordination Gross Motor Movements are  Fluid and Coordinated: Yes Extremity Assessment RLE Assessment RLE Assessment:  (At least 3+/5 observed with functional mobility) LLE Assessment LLE Assessment:  (At least 3+/5 observed with functional mobility) Mobility (including Balance) Bed Mobility Bed  Mobility: Yes Rolling Left: 5: Supervision Rolling Left Details (indicate cue type and reason): Cues for initiation and technique logrolling Left Sidelying to Sit: 5: Supervision Left Sidelying to Sit Details (indicate cue type and reason): Cues for technique Transfers Transfers: Yes Sit to Stand: 4: Min assist;With upper extremity assist;From bed Sit to Stand Details (indicate cue type and reason): Cues for hand placement Stand to Sit: 5: Supervision;With upper extremity assist;To toilet;With armrests Stand to Sit Details: Cues for hand placement and no bending Ambulation/Gait Ambulation/Gait: Yes Ambulation/Gait Assistance: 4: Min assist Ambulation/Gait Assistance Details (indicate cue type and reason): A for steadying and cues for posture and RW management Ambulation Distance (Feet): 20 Feet (2x20, limited by fatigue) Assistive device: Rolling walker Gait Pattern: Step-through pattern;Decreased stride length Stairs: No  Posture/Postural Control Posture/Postural Control: No significant limitations Balance Balance Assessed: No   End of Session PT - End of Session Equipment Utilized During Treatment: Gait belt;Back brace Activity Tolerance: Patient tolerated treatment well Patient left: in chair;with call bell in reach Nurse Communication: Mobility status for transfers;Mobility status for ambulation General Behavior During Session: [facility] for tasks performed Cognition: [facility] for tasks performed  [facility] [place], [place] [phone] 04/01/2011, 1:39 PM

## 2011-04-02 MED ORDER — PANTOPRAZOLE SODIUM 40 MG PO TBEC
40.0000 mg | DELAYED_RELEASE_TABLET | Freq: Every day | ORAL | Status: DC
  Administered 2011-04-02: 40 mg via ORAL
  Filled 2011-04-02: qty 1

## 2011-04-02 MED ORDER — MAGNESIUM CITRATE PO SOLN
1.0000 | Freq: Once | ORAL | Status: AC
  Administered 2011-04-02: 1 via ORAL
  Filled 2011-04-02: qty 296

## 2011-04-02 NOTE — Progress Notes (Signed)
Physical Therapy Treatment Patient Details Name: HULA TASSO MRN: [id number] DOB: 1950-06-03 Today's Date: 04/02/2011  PT Assessment/Plan  PT - Assessment/Plan Comments on Treatment Session: Pt. limited by nausea and pain today PT Plan: Discharge plan remains appropriate;Frequency remains appropriate Follow Up Recommendations: Home health PT Equipment Recommended: Rolling walker with 5" wheels PT Goals  Acute Rehab PT Goals PT Goal: Rolling Supine to Right Side - Progress: Progressing toward goal PT Goal: Supine/Side to Sit - Progress: Progressing toward goal PT Goal: Sit to Supine/Side - Progress: Progressing toward goal Pt will go Sit to Stand: with modified independence PT Goal: Sit to Stand - Progress: Progressing toward goal PT Goal: Ambulate - Progress: Progressing toward goal  PT Treatment Precautions/Restrictions  Precautions Precautions: Back Precaution Booklet Issued: No Required Braces or Orthoses: Yes Spinal Brace: Lumbar corset;Applied in sitting position Restrictions Weight Bearing Restrictions: No Mobility (including Balance) Bed Mobility Bed Mobility: Yes Rolling Right: 6: Modified independent (Device/Increase time);With rail Right Sidelying to Sit: 6: Modified independent (Device/Increase time);With rails;HOB elevated (comment degrees) (15 degrees) Transfers Transfers: Yes Sit to Stand: 5: Supervision;From bed;With upper extremity assist Stand to Sit: 5: Supervision;With upper extremity assist;To chair/3-in-1 Ambulation/Gait Ambulation/Gait: Yes Ambulation/Gait Assistance: 5: Supervision Ambulation Distance (Feet): 5 Feet Assistive device: Rolling walker Stairs: No    Exercise    End of Session PT - End of Session Equipment Utilized During Treatment: Back brace Activity Tolerance: Patient limited by pain;Treatment limited secondary to medical complications (Comment) (pt. with increased nausea) Patient left: in chair;with call bell in  reach Nurse Communication: Mobility status for transfers;Mobility status for ambulation General Behavior During Session: [facility] for tasks performed Cognition: [facility] for tasks performed  Feltis, Nicki Reaper 04/02/2011, 12:56 PM Nicki Reaper. Feltis, PT, DPT [phone]

## 2011-04-02 NOTE — Progress Notes (Signed)
Subjective: Nauseated but pain reasonably well - controlled   Objective: Vital signs in last 24 hours: Temp:  [98.3 F (36.8 C)-99.7 F (37.6 C)] 99.3 F (37.4 C) (12/01 0544) Pulse Rate:  [80-95] 95  (12/01 0544) Resp:  [16-20] 18  (12/01 0544) BP: (97-112)/(55-60) 112/60 mmHg (12/01 0544) SpO2:  [94 %-98 %] 94 % (12/01 0544) Weight:  [89.8 kg (197 lb 15.6 oz)] 197 lb 15.6 oz (89.8 kg) (11/30 2115)  Intake/Output from previous day: 11/30 0701 - 12/01 0700 In: 425 [P.O.:425] Out: 750 [Urine:750] Intake/Output this shift:    Exam:  Dorsiflexion/Plantar flexion intact  Labs:  Basename 04/01/11 0516 03/30/11 1348  HGB 9.5* 13.5    Basename 04/01/11 0516 03/30/11 1348  WBC 8.9 11.0*  RBC 2.86* 4.09  HCT 27.8* 39.3  PLT 204 280    Basename 04/01/11 0516 03/30/11 1348  NA 137 137  K 4.1 3.4*  CL 102 96  CO2 27 30  BUN 21 25*  CREATININE 0.83 0.90  GLUCOSE 106* 107*  CALCIUM 8.6 10.2   No results found for this basename: LABPT:2,INR:2 in the last 72 hours  Assessment/Plan: Hl iv - mag citrata - mobilize - d/c Mon   Lindsay Aguirre SCOTT 04/02/2011, 8:27 AM

## 2011-04-03 MED ORDER — BISACODYL 10 MG RE SUPP
10.0000 mg | Freq: Every day | RECTAL | Status: DC | PRN
  Administered 2011-04-03: 10 mg via RECTAL
  Filled 2011-04-03: qty 1

## 2011-04-03 MED ORDER — METOCLOPRAMIDE HCL 5 MG/ML IJ SOLN
10.0000 mg | Freq: Four times a day (QID) | INTRAMUSCULAR | Status: DC
  Administered 2011-04-03 – 2011-04-04 (×4): 10 mg via INTRAVENOUS
  Filled 2011-04-03 (×8): qty 2

## 2011-04-03 NOTE — Progress Notes (Signed)
Subjective: No bm yet otherwise ok   Objective: Vital signs in last 24 hours: Temp:  [98.6 F (37 C)-99.8 F (37.7 C)] 98.7 F (37.1 C) (12/02 0546) Pulse Rate:  [88-89] 89  (12/02 0546) Resp:  [18] 18  (12/02 0546) BP: (102-116)/(51-64) 104/51 mmHg (12/02 0546) SpO2:  [98 %] 98 % (12/02 0546)  Intake/Output from previous day: 12/01 0701 - 12/02 0700 In: 360 [P.O.:360] Out: -  Intake/Output this shift: Total I/O In: 150 [P.O.:150] Out: -   Exam:  Dorsiflexion/Plantar flexion intact  Labs:  [facility] 04/01/11 0516  HGB 9.5*    Basename 04/01/11 0516  WBC 8.9  RBC 2.86*  HCT 27.8*  PLT 204    Basename 04/01/11 0516  NA 137  K 4.1  CL 102  CO2 27  BUN 21  CREATININE 0.83  GLUCOSE 106*  CALCIUM 8.6   No results found for this basename: LABPT:2,INR:2 in the last 72 hours  Assessment/Plan: Continue to mobilize - dc am or tuesday   DEAN,GREGORY SCOTT 04/03/2011, 11:03 AM

## 2011-04-04 MED ORDER — HYDROCODONE-ACETAMINOPHEN 5-325 MG PO TABS: 1.0000 | ORAL_TABLET | ORAL | Status: AC | PRN

## 2011-04-04 MED ORDER — METHOCARBAMOL 500 MG PO TABS: 500.0000 mg | ORAL_TABLET | Freq: Four times a day (QID) | ORAL | Status: AC | PRN

## 2011-04-04 MED ORDER — OXYCODONE-ACETAMINOPHEN 5-325 MG PO TABS: 1.0000 | ORAL_TABLET | ORAL | Status: DC | PRN

## 2011-04-04 NOTE — Progress Notes (Signed)
Subjective: 4 Days Post-Op Procedure(s) (LRB): POSTERIOR LUMBAR FUSION 2 WITH HARDWARE REMOVAL (N/A) Patient reports pain as mild.   Had bowel movement.  Eating well.  Pain controlled with po meds.  Ambulating in hallway Objective: Vital signs in last 24 hours: Temp:  [97.8 F (36.6 C)-99.7 F (37.6 C)] 97.8 F (36.6 C) (12/03 0518) Pulse Rate:  [82-87] 82  (12/03 0518) Resp:  [17-20] 17  (12/03 0518) BP: (108-112)/(50-70) 108/54 mmHg (12/03 0518) SpO2:  [93 %-96 %] 93 % (12/03 0518)  Intake/Output from previous day: 12/02 0701 - 12/03 0700 In: 640 [P.O.:640] Out: 103 [Urine:100; Stool:3] Intake/Output this shift:    No results found for this basename: HGB:5 in the last 72 hours No results found for this basename: WBC:2,RBC:2,HCT:2,PLT:2 in the last 72 hours No results found for this basename: NA:2,K:2,CL:2,CO2:2,BUN:2,CREATININE:2,GLUCOSE:2,CALCIUM:2 in the last 72 hours No results found for this basename: LABPT:2,INR:2 in the last 72 hours  ABD soft Neurovascular intact Dorsiflexion/Plantar flexion intact Incision: no drainage  Assessment/Plan: 4 Days Post-Op Procedure(s) (LRB): POSTERIOR LUMBAR FUSION 2 WITH HARDWARE REMOVAL (N/A) Discharge home with home health  Lindsay Aguirre M 04/04/2011, 8:24 AM

## 2011-04-04 NOTE — Progress Notes (Signed)
Occupational Therapy Evaluation Patient Details Name: Lindsay Aguirre MRN: [id number] DOB: 1950/10/20 Today's Date: 04/04/2011  Problem List:  Patient Active Problem List  Diagnoses  . Spondylolisthesis, grade 2    Past Medical History:  Past Medical History  Diagnosis Date  . PONV (postoperative nausea and vomiting)   . Hypertension   . Hypothyroidism   . Arthritis   . Gout   . Neuromuscular disorder    Past Surgical History:  Past Surgical History  Procedure Date  . Cervical fusion '99 & '03  . Back surgery     2009  . Rotator cuff surgery '08    right  . Cataract extraction w/phaco 12/13/2010    Procedure: CATARACT EXTRACTION PHACO AND INTRAOCULAR LENS PLACEMENT (IOC);  Surgeon: Gemma Payor;  Location: [facility];  Service: Ophthalmology;  Laterality: Right;  CDE: 8.94    OT Assessment/Plan/Recommendation OT Assessment Clinical Impression Statement: Pt able to perform ADLs/functional transfers at mod I level.  All education completed. Pt will have necessary level of assistance from sister upone d/c home.  OT Recommendation/Assessment: Patient does not need any further OT services OT Recommendation Equipment Recommended: None recommended by PT OT Goals    OT Evaluation Precautions/Restrictions  Precautions Precautions: Back Precaution Booklet Issued: No Precaution Comments: Pt able to verbalize all precautions without cues Required Braces or Orthoses: Yes Spinal Brace: Lumbar corset;Applied in sitting position Restrictions Weight Bearing Restrictions: No Prior Functioning Home Living Lives With: Family Receives Help From: Family Type of Home: House Home Layout: One level Home Access: Level entry Bathroom Shower/Tub: Tub/shower unit;Walk-in shower Bathroom Toilet: Standard Home Adaptive Equipment: Bedside commode/3-in-1;Tub transfer bench Prior Function Level of Independence: Independent with basic ADLs;Independent with homemaking with  ambulation;Independent with gait;Independent with transfers Able to Take Stairs?: Yes Driving: No ADL ADL Eating/Feeding: Simulated;Independent Where Assessed - Eating/Feeding: Chair Grooming: Simulated;Modified independent Where Assessed - Grooming: Standing at sink Upper Body Bathing: Simulated;Modified independent Where Assessed - Upper Body Bathing: Sitting, chair;Unsupported Lower Body Bathing: Simulated;Modified independent Where Assessed - Lower Body Bathing: Sit to stand from chair Upper Body Dressing: Simulated;Modified independent Where Assessed - Upper Body Dressing: Unsupported;Sitting, chair Lower Body Dressing: Simulated;Modified independent Where Assessed - Lower Body Dressing: Sit to stand from chair Toilet Transfer: Simulated;Modified independent Toilet Transfer Method: Ambulating Toileting - Clothing Manipulation: Simulated;Independent Where Assessed - Toileting Clothing Manipulation: Standing Toileting - Hygiene: Simulated;Modified independent Where Assessed - Toileting Hygiene: Standing Tub/Shower Transfer: Not assessed Equipment Used: Rolling walker ADL Comments: Pt requires mod I due to increased time.  Pt able to verbalize and adhere to 3/3 back precautions.   Vision/Perception    Cognition Cognition Arousal/Alertness: Awake/alert Overall Cognitive Status: Appears within functional limits for tasks assessed Orientation Level: Oriented X4 Sensation/Coordination   Extremity Assessment RUE Assessment RUE Assessment: Within Functional Limits LUE Assessment LUE Assessment: Within Functional Limits Mobility  Bed Mobility Bed Mobility: No  Transfers Transfers: Yes Sit to Stand: 6: Modified independent (Device/Increase time) Stand to Sit: 6: Modified independent (Device/Increase time) Exercises   End of Session OT - End of Session Equipment Utilized During Treatment: Back brace Activity Tolerance: Patient tolerated treatment well Patient left: in  chair;with call bell in reach General Behavior During Session: [facility] for tasks performed Cognition: [facility] for tasks performed   Cipriano Mile 04/04/2011, 1:04 PM  04/04/2011 Cipriano Mile OTR/L Pager [phone] Office [phone]

## 2011-04-04 NOTE — Progress Notes (Signed)
Physical Therapy Treatment Patient Details Name: Lindsay Aguirre MRN: [id number] DOB: Aug 28, 1950 Today's Date: 04/04/2011  GOALS NOT UPDATED. PT TO DC TODAY. PT Assessment/Plan  PT - Assessment/Plan Comments on Treatment Session: Pt progressing very well with ambulation. No nausea or vomiting this session. Pt has no stairs to enter house. She is Independant with brace and precautions.  PT Plan: Discharge plan remains appropriate PT Frequency: Min 5X/week Follow Up Recommendations: Home health PT Equipment Recommended: None recommended by PT PT Goals  Acute Rehab PT Goals PT Goal: Rolling Supine to Right Side - Progress: Met PT Goal: Supine/Side to Sit - Progress: Met PT Goal: Sit to Supine/Side - Progress: Met PT Goal: Sit to Stand - Progress: Met PT Goal: Ambulate - Progress: Met  PT Treatment Precautions/Restrictions  Precautions Precautions: Back Precaution Booklet Issued: No Precaution Comments: Pt able to verbalize all precautions without cues Required Braces or Orthoses: Yes Spinal Brace: Lumbar corset;Applied in sitting position Restrictions Weight Bearing Restrictions: No Mobility (including Balance) Bed Mobility Rolling Right: 6: Modified independent (Device/Increase time) Rolling Left: 6: Modified independent (Device/Increase time) Right Sidelying to Sit: 6: Modified independent (Device/Increase time) Sit to Supine - Right: 6: Modified independent (Device/Increase time) Transfers Transfers: Yes Sit to Stand: 6: Modified independent (Device/Increase time) Stand to Sit: 6: Modified independent (Device/Increase time) Ambulation/Gait Ambulation/Gait: Yes Ambulation/Gait Assistance: 5: Supervision Ambulation/Gait Assistance Details (indicate cue type and reason): Cues for RW management.  Ambulation Distance (Feet): 250 Feet Assistive device: Rolling walker Gait Pattern: Within Functional Limits Stairs: No    Exercise    End of Session PT - End of  Session Equipment Utilized During Treatment: Gait belt;Back brace Activity Tolerance: Patient tolerated treatment well Patient left: in chair;with call bell in reach Nurse Communication: Mobility status for transfers;Mobility status for ambulation General Behavior During Session: [facility] for tasks performed Cognition: [facility] for tasks performed  [name], [name] 04/04/2011, 12:44 PM 04/04/2011 [name] PTA [phone] pager [phone] office

## 2011-04-04 NOTE — Discharge Summary (Signed)
Patient ID: Lindsay Aguirre MRN: [id number] DOB/AGE: 1950-09-13 60 y.o.  Admit date: 03/31/2011 Discharge date: 04/04/2011  Admission Diagnoses:  Principal Problem:  *Spondylolisthesis, grade 2   Discharge Diagnoses:  Same  Past Medical History  Diagnosis Date  . PONV (postoperative nausea and vomiting)   . Hypertension   . Hypothyroidism   . Arthritis   . Gout   . Neuromuscular disorder     Surgeries: Procedure(s): POSTERIOR LUMBAR FUSION 2 WITH HARDWARE REMOVAL on 03/31/2011   Consultants:  none  Discharged Condition: Improved  Hospital Course: Lindsay Aguirre is an 60 y.o. female who was admitted 03/31/2011 for operative treatment ofSpondylolisthesis, grade 2. Patient has severe unremitting pain that affects sleep, daily activities, and work/hobbies. After pre-op clearance the patient was taken to the operating room on 03/31/2011 and underwent  Procedure(s): POSTERIOR LUMBAR FUSION 2 WITH HARDWARE REMOVAL.   Received PT for ambulation and gait training.  Aspen LSO when ambulating.  Wound healing without drainage. Taking a reg diet with adequate BM's. Pain controlled with po analgesics. Patient was given perioperative antibiotics: Anti-infectives     Start     Dose/Rate Route Frequency Ordered Stop   03/31/11 2200   ceFAZolin (ANCEF) IVPB 1 g/50 mL premix        1 g 100 mL/hr over 30 Minutes Intravenous 3 times per day 03/31/11 1811 04/01/11 0640   03/30/11 1345   ceFAZolin (ANCEF) IVPB 2 g/50 mL premix        2 g 100 mL/hr over 30 Minutes Intravenous 60 min pre-op 03/30/11 1342 03/31/11 1321           Patient was given sequential compression devices, early ambulation, and chemoprophylaxis to prevent DVT.  Patient benefited maximally from hospital stay and there were no complications.    Recent vital signs: Patient Vitals for the past 24 hrs:  BP Temp Temp src Pulse Resp SpO2  04/04/11 0518 108/54 mmHg 97.8 F (36.6 C) Oral 82  17  93 %  04/03/11 2320  110/50 mmHg 99.7 F (37.6 C) Oral 84  20  96 %  04/03/11 1400 112/70 mmHg 98.6 F (37 C) Oral 87  20  96 %     Recent laboratory studies: No results found for this basename: WBC:2,HGB:2,HCT:2,PLT:2,NA:2,K:2,CL:2,CO2:2,BUN:2,CREATININE:2,GLUCOSE:2,PT:2,INR:2,CALCIUM,2: in the last 72 hours   Discharge Medications:  Current Discharge Medication List    START taking these medications   Details  methocarbamol (ROBAXIN) 500 MG tablet Take 1 tablet (500 mg total) by mouth every 6 (six) hours as needed (spasm). Qty: 40 tablet, Refills: 1    oxyCODONE-acetaminophen (PERCOCET) 5-325 MG per tablet Take 1-2 tablets by mouth every 4 (four) hours as needed for pain. Qty: 30 tablet, Refills: 0      CONTINUE these medications which have NOT CHANGED   Details  allopurinol (ZYLOPRIM) 300 MG tablet Take 300 mg by mouth daily.      B Complex-C (SUPER B COMPLEX) TABS Take 1 tablet by mouth daily.      Black Cohosh 40 MG CAPS Take 1 capsule by mouth 2 (two) times daily.      calcium-vitamin D (OSCAL WITH D) 500-200 MG-UNIT per tablet Take 1 tablet by mouth 2 (two) times daily.      Cholecalciferol (VITAMIN D3) 2000 UNITS TABS Take 1 tablet by mouth daily.      levothyroxine (SYNTHROID, LEVOTHROID) 88 MCG tablet Take 88 mcg by mouth daily.      lisinopril-hydrochlorothiazide (PRINZIDE,ZESTORETIC) 20-12.5 MG per tablet Take  1 tablet by mouth daily.      OVER THE COUNTER MEDICATION Take 1 tablet by mouth daily. [facility]     vitamin C (ASCORBIC ACID) 500 MG tablet Take 500 mg by mouth daily.        STOP taking these medications     diclofenac (VOLTAREN) 50 MG EC tablet      HYDROcodone-acetaminophen (VICODIN) 5-500 MG per tablet         Diagnostic Studies: Dg Chest 2 View  03/30/2011  *RADIOLOGY REPORT*  Clinical Data: Preoperative chest x-ray for back surgery.  CHEST - 2 VIEW  Comparison: Chest x-ray 03/20/2008.  Findings: The cardiac silhouette, mediastinal and hilar contours are  within normal limits and stable.  There is mild tortuosity of the thoracic aorta.  The lungs are clear.  No pleural effusion. The bony thorax is intact.  The cervical fusion hardware is noted incidentally.  IMPRESSION: No acute cardiopulmonary findings.  No change since prior examination.  Original Report Authenticated By: P. Loralie Champagne, M.D.   Dg Lumbar Spine 2-3 Views  03/31/2011  *RADIOLOGY REPORT*  Clinical Data: 60 year old female - L3-L5 PLIF.  LUMBAR SPINE - 2-3 VIEW  Comparison: 03/29/2011 MRI  Findings: Four intraoperative spot views of the lower lumbar spine are submitted postoperatively for interpretation.  Posterior rod bipedicular screw fixation and interbody fusion materials identified at L3-L4-L5. No gross complicating features are identified.  IMPRESSION: L3-L5 PLIF without complicating features.  Original Report Authenticated By: Rosendo Gros, M.D.   Mr Lumbar Spine W Wo Contrast  03/29/2011  *RADIOLOGY REPORT*  Clinical Data: Left hip buttock and upper leg pain.  Weakness.  BUN and creatinine were obtained on site at [facility] at [address] Results:  BUN 26 mg/dL,  Creatinine 1.0 mg/dL.  MRI LUMBAR SPINE WITHOUT AND WITH CONTRAST  Technique:  Multiplanar and multiecho pulse sequences of the lumbar spine were obtained without and with intravenous contrast.  Contrast: 18mL MULTIHANCE GADOBENATE DIMEGLUMINE 529 MG/ML IV SOLN  Comparison: Intraoperative image dated 03/13/2008  Findings: Scan extends from T11-12 through S3.  Tip of the conus is at L1-2 and appears normal. Paraspinal soft tissues are normal.  T11-12 through L1-2:  Normal.  L2-3:  Small diffuse disc bulge with a focal disc protrusion into the left neural foramen and slightly lateral to it.  However, the left L2 nerve exits without impingement and the thecal sac is not compressed.  L3-4: Prominent  broad-based disc protrusion with an extruded 12 x 10 x 8 mm disc fragment extending superiorly behind the body  of L3 in the left lateral recess compressing the left L3 nerve roots. The broad-based protrusion extends into the right neural foramen and causes slight right foraminal stenosis.  There is marked hypertrophy of the left facet joint with protrusion of the left ligamentum flavum into the spinal canal compressing the left side of the thecal sac which should affect the left L4 and L5 nerve roots. There is also moderate arthritis of the right facet joint with bilateral joint effusions.  The spinous process of L3 has been resected.  L4-5:  Interbody and posterior fusion with excellent decompression of the thecal sac and nerve roots.  L5 S1:  Marked disc space narrowing with a tiny broad-based disc bulge with accompanying osteophytes.  However, both L5 nerves appear to exit without impingement.  No facet arthritis.  IMPRESSION:  1.  Large broad-based soft disc protrusion at L3-4 with an extruded free fragment extending superiorly  in the left lateral recess as well as displaced left ligamentum flavum and hypertrophied left facet compressing the left side of the thecal sac and left-sided nerve roots.  These findings should affect the left L3, L4, and L5 nerve roots.  2.  Small left far lateral disc protrusion at L2-3 without discrete impingement upon the exiting L2 nerve.  Original Report Authenticated By: Gwynn Burly, M.D.   Dg C-arm 61-120 Min  03/31/2011  CLINICAL DATA: l3 - 5PLIF   C-ARM 61-120 MINUTES  Fluoroscopy was utilized by the requesting physician.  No radiographic  interpretation.      Disposition: Home or Self Care  Discharge Orders    Future Orders Please Complete By Expires   Diet - low sodium heart healthy      Call MD / Call 911      Comments:   If you experience chest pain or shortness of breath, CALL 911 and be transported to the hospital emergency room.  If you develope a fever above 101 F, pus (white drainage) or increased drainage or redness at the wound, or calf pain, call your  surgeon's office.   Constipation Prevention      Comments:   Drink plenty of fluids.  Prune juice may be helpful.  You may use a stool softener, such as Colace (over the counter) 100 mg twice a day.  Use MiraLax (over the counter) for constipation as needed.   Increase activity slowly as tolerated      Weight Bearing as taught in Physical Therapy      Comments:   Use a walker or crutches as instructed.   Discharge instructions      Comments:   Wear brace at all times when out of bed.  May shower on Tuesday.  Change dressing daily or as needed.  Walk as tolerated using walker.no bending,lifting or twisting   Driving restrictions      Comments:   No driving   Lifting restrictions      Comments:   No lifting for 12 weeks      Follow-up Information    Follow up with Kerrin Champagne, MD.   Contact information:   [facility] [address] [place] [address] [phone]           Signed: Wende Neighbors 04/04/2011, 8:37 AM

## 2011-04-05 MED FILL — Heparin Sodium (Porcine) Inj 1000 Unit/ML: INTRAMUSCULAR | Qty: 30 | Status: AC

## 2011-04-05 MED FILL — Sodium Chloride IV Soln 0.9%: INTRAVENOUS | Qty: 1000 | Status: AC

## 2011-04-05 NOTE — Progress Notes (Signed)
CARE MANAGEMENT NOTE 04/05/2011  Patient:  Lindsay Aguirre, Lindsay Aguirre   Account Number:  [id number]  Date Initiated:  04/05/2011  Documentation initiated by:  Vance Peper  Subjective/Objective Assessment:     Action/Plan:   Anticipated DC Date:  04/04/2011   Anticipated DC Plan:  HOME/SELF CARE      DC Planning Services  CM consult  Status of service:  Completed, signed off Discharge Disposition:  HOME/SELF CARE  04/05/11 1000 Vance Peper, RN BSN Case Manager Spoke with Maud Deed, PA. informed her patient states she didnt need [facility] after last surgery, didnt feel she needs it now. Per [facility], pt will be fine.no need to arrange [facility].

## 2011-04-06 NOTE — Discharge Summary (Signed)
Discharge summary reviewed and I concur with this note.

## 2012-07-31 ENCOUNTER — Encounter: Payer: Self-pay | Admitting: Family Medicine

## 2012-08-06 ENCOUNTER — Other Ambulatory Visit: Payer: Self-pay | Admitting: *Deleted

## 2012-08-06 MED ORDER — ROSUVASTATIN CALCIUM 10 MG PO TABS: 10.0000 mg | ORAL_TABLET | Freq: Every day | ORAL | Status: DC

## 2012-09-03 ENCOUNTER — Other Ambulatory Visit: Payer: Self-pay | Admitting: Nurse Practitioner

## 2012-09-05 NOTE — Telephone Encounter (Signed)
LAST LABS 9/13 

## 2012-09-14 ENCOUNTER — Other Ambulatory Visit: Payer: Self-pay | Admitting: Nurse Practitioner

## 2012-09-17 ENCOUNTER — Telehealth: Payer: Self-pay | Admitting: Family Medicine

## 2012-09-17 ENCOUNTER — Ambulatory Visit (INDEPENDENT_AMBULATORY_CARE_PROVIDER_SITE_OTHER): Payer: 59 | Admitting: Nurse Practitioner

## 2012-09-17 ENCOUNTER — Other Ambulatory Visit: Payer: Self-pay | Admitting: Nurse Practitioner

## 2012-09-17 ENCOUNTER — Encounter: Payer: Self-pay | Admitting: Nurse Practitioner

## 2012-09-17 VITALS — BP 106/68 | HR 68 | Temp 97.8°F | Wt 193.0 lb

## 2012-09-17 DIAGNOSIS — R350 Frequency of micturition: Secondary | ICD-10-CM

## 2012-09-17 LAB — POCT URINALYSIS DIPSTICK
Nitrite, UA: NEGATIVE
Protein, UA: NEGATIVE
Spec Grav, UA: 1.01
Urobilinogen, UA: NEGATIVE
pH, UA: 5

## 2012-09-17 LAB — POCT UA - MICROSCOPIC ONLY
Casts, Ur, LPF, POC: NEGATIVE
Crystals, Ur, HPF, POC: NEGATIVE
Yeast, UA: NEGATIVE

## 2012-09-17 MED ORDER — NITROFURANTOIN MONOHYD MACRO 100 MG PO CAPS: 100.0000 mg | ORAL_CAPSULE | Freq: Two times a day (BID) | ORAL | Status: DC

## 2012-09-17 NOTE — Telephone Encounter (Signed)
Last seen 01/30/12, no appt scheduled

## 2012-09-17 NOTE — Telephone Encounter (Signed)
APT MADE 

## 2012-09-17 NOTE — Progress Notes (Signed)
  Subjective:    Patient ID: Lindsay Aguirre, female    DOB: 1951-04-18, 62 y.o.   MRN: [id number]  HPI- Patient in today c/o urinary frequency and urgency- light low back pain. Started Thursday and has had some nausea. Has been getting up 2-3 X at night to void. Slight dysuria.    Review of Systems  Constitutional: Negative for fever and chills.  Respiratory: Negative.   Cardiovascular: Negative.   Genitourinary: Positive for dysuria, urgency and frequency.       Objective:   Physical Exam  Constitutional: She is oriented to person, place, and time. She appears well-developed and well-nourished.  Cardiovascular: Normal rate, regular rhythm, normal heart sounds and intact distal pulses.   Pulmonary/Chest: Effort normal and breath sounds normal.  Abdominal: There is tenderness (mild supra pubic pain on palpation).  Genitourinary:  No CVA tenderness bil.  Neurological: She is alert and oriented to person, place, and time.  Skin: Skin is warm.  BP 106/68  Pulse 68  Temp(Src) 97.8 F (36.6 C) (Oral)  Wt 193 lb (87.544 kg)  BMI 32.12 kg/m2 Results for orders placed in visit on 09/17/12  POCT URINALYSIS DIPSTICK      Result Value Range   Color, UA gold     Clarity, UA clear     Glucose, UA neg     Bilirubin, UA neg     Ketones, UA neg     Spec Grav, UA 1.010     Blood, UA trace     pH, UA 5.0     Protein, UA neg     Urobilinogen, UA negative     Nitrite, UA neg     Leukocytes, UA Negative    POCT UA - MICROSCOPIC ONLY      Result Value Range   WBC, Ur, HPF, POC occ     RBC, urine, microscopic neg     Bacteria, U Microscopic few     Mucus, UA occ     Epithelial cells, urine per micros few     Crystals, Ur, HPF, POC neg     Casts, Ur, LPF, POC neg     Yeast, UA neg             Assessment & Plan:  1. Frequent urination Force fluids - POCT urinalysis dipstick - POCT UA - Microscopic Only - nitrofurantoin, macrocrystal-monohydrate, (MACROBID) 100 MG capsule;  Take 1 capsule (100 mg total) by mouth 2 (two) times daily.  Dispense: 14 capsule; Refill: 0  [name] Daphine Deutscher, FNP

## 2012-09-17 NOTE — Patient Instructions (Addendum)
Urinary Frequency The number of times a normal person urinates depends upon how much liquid they take in and how much liquid they are losing. If the temperature is hot and there is high humidity then the person will sweat more and usually breathe a little more frequently. These factors decrease the amount of frequency of urination that would be considered normal. The amount you drink is easily determined, but the amount of fluid lost is sometimes more difficult to calculate.  Fluid is lost in two ways:  Sensible fluid loss is usually measured by the amount of urine that you get rid of. Losses of fluid can also occur with diarrhea.  Insensible fluid loss is more difficult to measure. It is caused by evaporation. Insensible loss of fluid occurs through breathing and sweating. It usually ranges from a little less than a quart to a little more than a quart of fluid a day. In normal temperatures and activity levels the average person may urinate 4 to 7 times in a 24-hour period. Needing to urinate more often than that could indicate a problem. If one urinates 4 to 7 times in 24 hours and has large volumes each time, that could indicate a different problem from one who urinates 4 to 7 times a day and has small volumes. The time of urinating is also an important. Most urinating should be done during the waking hours. Getting up at night to urinate frequently can indicate some problems. CAUSES  The bladder is the organ in your lower abdomen that holds urine. Like a balloon, it swells some as it fills up. Your nerves sense this and tell you it is time to head for the bathroom. There are a number of reasons that you might feel the need to urinate more often than usual. They include:  Urinary tract infection. This is usually associated with other signs such as burning when you urinate.  In men, problems with the prostate (a walnut-size gland that is located near the tube that carries urine out of your body).  There are two reasons why the prostate can cause an increased frequency of urination:  An enlarged prostate that does not let the bladder empty well. If the bladder only half empties when you urinate then it only has half the capacity to fill before you have to urinate again.  The nerves in the bladder become more hypersensitive with an increased size of the prostate even if the bladder empties completely.  Pregnancy.  Obesity. Excess weight is more likely to cause a problem for women more than for men.  Bladder stones or other bladder problems.  Caffeine.  Alcohol.  Medications. For example, drugs that help the body get rid of extra fluid (diuretics) increase urine production. Some other medicines must be taken with lots of fluids.  Muscle or nerve weakness. This might be the result of a spinal cord injury, a stroke, multiple sclerosis or Parkinson's disease.  Long-standing diabetes can decrease the sensation of the bladder. This loss of sensation makes it harder to sense the bladder needs to be emptied. Over a period of years the bladder is stretched out by constant overfilling. This weakens the bladder muscles so that the bladder does not empty well and has less capacity to fill with new urine.  Interstitial cystitis (also called painful bladder syndrome). This condition develops because the tissues that line the insider of the bladder are inflamed (inflammation is the body's way of reacting to injury or infection). It causes pain  the insider of the bladder are inflamed (inflammation is the body's way of reacting to injury or infection). It causes pain and frequent urination. It occurs in women more often than in men.  DIAGNOSIS    To decide what might be causing your urinary frequency, your healthcare provider will probably:   Ask about symptoms you have noticed.   Ask about your overall health. This will include questions about any medications you are taking.   Do a physical examination.   Order some tests. These might include:   A blood test to check for diabetes or other health issues that could be  contributing to the problem.   Urine testing. This could measure the flow of urine and the pressure on the bladder.   A test of your neurological system (the brain, spinal cord and nerves). This is the system that senses the need to urinate.   A bladder test to check whether it is emptying completely when you urinate.   Cytoscopy. This test uses a thin tube with a tiny camera on it. It offers a look inside your urethra and bladder to see if there are problems.   Imaging tests. You might be given a contrast dye and then asked to urinate. X-rays are taken to see how your bladder is working.  TREATMENT   It is important for you to be evaluated to determine if the amount or frequency that you have is unusual or abnormal. If it is found to be abnormal the cause should be determined and this can usually be found out easily. Depending upon the cause treatment could include medication, stimulation of the nerves, or surgery.  There are not too many things that you can do as an individual to change your urinary frequency. It is important that you balance the amount of fluid intake needed to compensate for your activity and the temperature. Medical problems will be diagnosed and taken care of by your physician. There is no particular bladder training such as Kegel's exercises that you can do to help urinary frequency. This is an exercise this is usually done for people who have leaking of urine when they laugh cough or sneeze.  HOME CARE INSTRUCTIONS    Take any medications your healthcare provider prescribed or suggested. Follow the directions carefully.   Practice any lifestyle changes that are recommended. These might include:   Drinking less fluid or drinking at different times of the day. If you need to urinate often during the night, for example, you may need to stop drinking fluids early in the evening.   Cutting down on caffeine or alcohol. They both can make you need to urinate more often than normal.  Caffeine is found in coffee, tea and sodas.   Losing weight, if that is recommended.   Keep a journal or a log. You might be asked to record how much you drink and when and when you feel the need to urinate. This will also help evaluate how well the treatment provided by your physician is working.  SEEK MEDICAL CARE IF:    Your need to urinate often gets worse.   You feel increased pain or irritation when you urinate.   You notice blood in your urine.   You have questions about any medications that your healthcare provider recommended.   You notice blood, pus or swelling at the site of any test or treatment procedure.   You develop a fever of more than 100.5 F (38.1 C).  SEEK

## 2012-10-10 ENCOUNTER — Other Ambulatory Visit: Payer: 59

## 2012-10-13 ENCOUNTER — Other Ambulatory Visit: Payer: Self-pay | Admitting: Nurse Practitioner

## 2012-10-16 NOTE — Telephone Encounter (Signed)
Last seen 09/13 

## 2012-10-19 ENCOUNTER — Other Ambulatory Visit: Payer: Self-pay | Admitting: Nurse Practitioner

## 2012-10-24 ENCOUNTER — Ambulatory Visit (INDEPENDENT_AMBULATORY_CARE_PROVIDER_SITE_OTHER): Payer: 59 | Admitting: Nurse Practitioner

## 2012-10-24 ENCOUNTER — Encounter: Payer: Self-pay | Admitting: Nurse Practitioner

## 2012-10-24 VITALS — BP 112/68 | HR 78 | Temp 98.0°F | Ht 65.0 in | Wt 195.5 lb

## 2012-10-24 DIAGNOSIS — E785 Hyperlipidemia, unspecified: Secondary | ICD-10-CM

## 2012-10-24 DIAGNOSIS — K5790 Diverticulosis of intestine, part unspecified, without perforation or abscess without bleeding: Secondary | ICD-10-CM

## 2012-10-24 DIAGNOSIS — I10 Essential (primary) hypertension: Secondary | ICD-10-CM

## 2012-10-24 DIAGNOSIS — M109 Gout, unspecified: Secondary | ICD-10-CM

## 2012-10-24 DIAGNOSIS — E039 Hypothyroidism, unspecified: Secondary | ICD-10-CM | POA: Insufficient documentation

## 2012-10-24 DIAGNOSIS — K5730 Diverticulosis of large intestine without perforation or abscess without bleeding: Secondary | ICD-10-CM

## 2012-10-24 LAB — COMPLETE METABOLIC PANEL WITH GFR
CO2: 26 mEq/L (ref 19–32)
Creat: 0.81 mg/dL (ref 0.50–1.10)
GFR, Est African American: 89 mL/min
GFR, Est Non African American: 78 mL/min
Glucose, Bld: 97 mg/dL (ref 70–99)
Total Bilirubin: 0.5 mg/dL (ref 0.3–1.2)
Total Protein: 7.2 g/dL (ref 6.0–8.3)

## 2012-10-24 LAB — THYROID PANEL WITH TSH: T4, Total: 9.6 ug/dL (ref 5.0–12.5)

## 2012-10-24 MED ORDER — LISINOPRIL-HYDROCHLOROTHIAZIDE 20-12.5 MG PO TABS: 1.0000 | ORAL_TABLET | Freq: Every day | ORAL | Status: DC

## 2012-10-24 MED ORDER — SYNTHROID 88 MCG PO TABS: 88.0000 ug | ORAL_TABLET | Freq: Every day | ORAL | Status: DC

## 2012-10-24 MED ORDER — ROSUVASTATIN CALCIUM 10 MG PO TABS: 10.0000 mg | ORAL_TABLET | Freq: Every day | ORAL | Status: DC

## 2012-10-24 MED ORDER — ALLOPURINOL 300 MG PO TABS: 300.0000 mg | ORAL_TABLET | Freq: Every day | ORAL | Status: DC

## 2012-10-24 NOTE — Progress Notes (Signed)
Subjective:    Patient ID: Lindsay Aguirre, female    DOB: 25-Aug-1950, 62 y.o.   MRN: [id number]  Hyperlipidemia This is a chronic problem. The current episode started more than 1 year ago. The problem is uncontrolled. Recent lipid tests were reviewed and are high. Exacerbating diseases include hypothyroidism and obesity. There are no known factors aggravating her hyperlipidemia. Pertinent negatives include no chest pain, focal sensory loss, leg pain, myalgias or shortness of breath. Current antihyperlipidemic treatment includes statins. Improvement on treatment: will have to see when labs are back. Compliance problems include adherence to diet and adherence to exercise.  Risk factors for coronary artery disease include hypertension, obesity and post-menopausal.  Hypertension This is a chronic problem. The current episode started more than 1 year ago. The problem has been resolved since onset. The problem is controlled. Pertinent negatives include no blurred vision, chest pain, headaches, malaise/fatigue, palpitations, peripheral edema, shortness of breath or sweats. There are no associated agents to hypertension. Risk factors for coronary artery disease include dyslipidemia, obesity and post-menopausal state. Past treatments include ACE inhibitors and diuretics. The current treatment provides significant improvement. Compliance problems include diet and exercise.  Hypertensive end-organ damage includes a thyroid problem.  Thyroid Problem Presents for follow-up (hypothyroidism) visit. Patient reports no anxiety, cold intolerance, constipation, fatigue, hoarse voice, leg swelling, menstrual problem, palpitations, visual change, weight gain or weight loss. The symptoms have been stable. Her past medical history is significant for hyperlipidemia.  GOUT Allopurinol- daily- Hasn't had a flare up in .6 years.    Review of Systems  Constitutional: Negative for weight loss, weight gain, malaise/fatigue  and fatigue.  HENT: Negative for hoarse voice.   Eyes: Negative for blurred vision.  Respiratory: Negative for shortness of breath.   Cardiovascular: Negative for chest pain and palpitations.  Gastrointestinal: Negative for constipation.  Endocrine: Negative for cold intolerance.  Genitourinary: Negative for menstrual problem.  Musculoskeletal: Negative for myalgias.  Neurological: Negative for headaches.  All other systems reviewed and are negative.       Objective:   Physical Exam  Constitutional: She is oriented to person, place, and time. She appears well-developed and well-nourished.  HENT:  Nose: Nose normal.  Mouth/Throat: Oropharynx is clear and moist.  Eyes: EOM are normal.  Neck: Trachea normal, normal range of motion and full passive range of motion without pain. Neck supple. No JVD present. Carotid bruit is not present. No thyromegaly present.  Cardiovascular: Normal rate, regular rhythm, normal heart sounds and intact distal pulses.  Exam reveals no gallop and no friction rub.   No murmur heard. Pulmonary/Chest: Effort normal and breath sounds normal.  Abdominal: Soft. Bowel sounds are normal. She exhibits no distension and no mass. There is no tenderness.  Musculoskeletal: Normal range of motion.  Lymphadenopathy:    She has no cervical adenopathy.  Neurological: She is alert and oriented to person, place, and time. She has normal reflexes.  Skin: Skin is warm and dry.  Psychiatric: She has a normal mood and affect. Her behavior is normal. Judgment and thought content normal.     BP 112/68  Pulse 78  Temp(Src) 98 F (36.7 C) (Oral)  Ht 5\' 5"  (1.651 m)  Wt 195 lb 8 oz (88.678 kg)  BMI 32.53 kg/m2      Assessment & Plan:  1. Hypertension Low Na+ diet - lisinopril-hydrochlorothiazide (PRINZIDE,ZESTORETIC) 20-12.5 MG per tablet; Take 1 tablet by mouth daily.  Dispense: 30 tablet; Refill: 5 - COMPLETE METABOLIC PANEL WITH GFR  2. Hyperlipidemia Low fat  diet and exercise - rosuvastatin (CRESTOR) 10 MG tablet; Take 1 tablet (10 mg total) by mouth daily.  Dispense: 30 tablet; Refill: 5 - NMR Lipoprofile with Lipids  3. Hypothyroidism - SYNTHROID 88 MCG tablet; Take 1 tablet (88 mcg total) by mouth daily before breakfast.  Dispense: 30 tablet; Refill: 5 - Thyroid Panel With TSH  4. Diverticulosis Avoid foods with small seeds or skin  5. Gout  - allopurinol (ZYLOPRIM) 300 MG tablet; Take 1 tablet (300 mg total) by mouth daily.  Dispense: 30 tablet; Refill: 5  Follow up in 3 months Patient given hemocult cards  [name] Daphine Deutscher, FNP

## 2012-10-24 NOTE — Patient Instructions (Signed)

## 2012-10-25 LAB — NMR LIPOPROFILE WITH LIPIDS
HDL Size: 9.5 nm (ref >=9.2)
HDL-C: 42 mg/dL (ref >=40)
LDL (calc): 53 mg/dL (ref <100)
LDL Size: 19.8 nm — ABNORMAL LOW (ref >20.5)
Large HDL-P: 6.8 umol/L (ref >=4.8)

## 2012-11-01 ENCOUNTER — Other Ambulatory Visit (INDEPENDENT_AMBULATORY_CARE_PROVIDER_SITE_OTHER): Payer: 59

## 2012-11-01 DIAGNOSIS — Z1212 Encounter for screening for malignant neoplasm of rectum: Secondary | ICD-10-CM

## 2012-11-01 NOTE — Progress Notes (Signed)
Patient dropped off fobt 

## 2012-11-02 LAB — FECAL OCCULT BLOOD, IMMUNOCHEMICAL: Fecal Occult Blood: NEGATIVE

## 2013-01-30 ENCOUNTER — Encounter: Payer: Self-pay | Admitting: Nurse Practitioner

## 2013-01-30 ENCOUNTER — Ambulatory Visit (INDEPENDENT_AMBULATORY_CARE_PROVIDER_SITE_OTHER): Payer: 59 | Admitting: Nurse Practitioner

## 2013-01-30 VITALS — BP 110/73 | HR 87 | Temp 98.0°F | Ht 64.0 in | Wt 198.0 lb

## 2013-01-30 DIAGNOSIS — I10 Essential (primary) hypertension: Secondary | ICD-10-CM

## 2013-01-30 DIAGNOSIS — E039 Hypothyroidism, unspecified: Secondary | ICD-10-CM

## 2013-01-30 DIAGNOSIS — E785 Hyperlipidemia, unspecified: Secondary | ICD-10-CM

## 2013-01-30 DIAGNOSIS — M109 Gout, unspecified: Secondary | ICD-10-CM

## 2013-01-30 DIAGNOSIS — Z23 Encounter for immunization: Secondary | ICD-10-CM

## 2013-01-30 MED ORDER — SYNTHROID 88 MCG PO TABS: 88.0000 ug | ORAL_TABLET | Freq: Every day | ORAL | Status: DC

## 2013-01-30 MED ORDER — ROSUVASTATIN CALCIUM 10 MG PO TABS: 10.0000 mg | ORAL_TABLET | Freq: Every day | ORAL | Status: DC

## 2013-01-30 MED ORDER — LISINOPRIL-HYDROCHLOROTHIAZIDE 20-12.5 MG PO TABS: 1.0000 | ORAL_TABLET | Freq: Every day | ORAL | Status: DC

## 2013-01-30 MED ORDER — ALLOPURINOL 300 MG PO TABS: 300.0000 mg | ORAL_TABLET | Freq: Every day | ORAL | Status: DC

## 2013-01-30 NOTE — Patient Instructions (Addendum)

## 2013-01-30 NOTE — Progress Notes (Signed)
Subjective:    Patient ID: Lindsay Aguirre, female    DOB: Jun 28, 1950, 62 y.o.   MRN: [id number]  Hyperlipidemia This is a chronic problem. The current episode started more than 1 year ago. The problem is uncontrolled. Recent lipid tests were reviewed and are high. Exacerbating diseases include hypothyroidism and obesity. There are no known factors aggravating her hyperlipidemia. Pertinent negatives include no chest pain, focal sensory loss, leg pain, myalgias or shortness of breath. Current antihyperlipidemic treatment includes statins. Improvement on treatment: will have to see when labs are back. Compliance problems include adherence to diet and adherence to exercise.  Risk factors for coronary artery disease include hypertension, obesity and post-menopausal.  Hypertension This is a chronic problem. The current episode started more than 1 year ago. The problem has been resolved since onset. The problem is controlled. Pertinent negatives include no blurred vision, chest pain, headaches, malaise/fatigue, palpitations, peripheral edema, shortness of breath or sweats. There are no associated agents to hypertension. Risk factors for coronary artery disease include dyslipidemia, obesity and post-menopausal state. Past treatments include ACE inhibitors and diuretics. The current treatment provides significant improvement. Compliance problems include diet and exercise.  Hypertensive end-organ damage includes a thyroid problem.  Thyroid Problem Presents for follow-up (hypothyroidism) visit. Patient reports no anxiety, cold intolerance, constipation, fatigue, hoarse voice, leg swelling, menstrual problem, palpitations, visual change, weight gain or weight loss. The symptoms have been stable. Her past medical history is significant for hyperlipidemia.  GOUT Allopurinol- daily- Hasn't had a flare up in .6 years.    Review of Systems  Constitutional: Negative for weight loss, weight gain, malaise/fatigue  and fatigue.  HENT: Negative for hoarse voice.   Eyes: Negative for blurred vision.  Respiratory: Negative for shortness of breath.   Cardiovascular: Negative for chest pain and palpitations.  Gastrointestinal: Negative for constipation.  Endocrine: Negative for cold intolerance.  Genitourinary: Negative for menstrual problem.  Musculoskeletal: Negative for myalgias.  Neurological: Negative for headaches.  All other systems reviewed and are negative.       Objective:   Physical Exam  Constitutional: She is oriented to person, place, and time. She appears well-developed and well-nourished.  HENT:  Nose: Nose normal.  Mouth/Throat: Oropharynx is clear and moist.  Eyes: EOM are normal.  Neck: Trachea normal, normal range of motion and full passive range of motion without pain. Neck supple. No JVD present. Carotid bruit is not present. No thyromegaly present.  Cardiovascular: Normal rate, regular rhythm, normal heart sounds and intact distal pulses.  Exam reveals no gallop and no friction rub.   No murmur heard. Pulmonary/Chest: Effort normal and breath sounds normal.  Abdominal: Soft. Bowel sounds are normal. She exhibits no distension and no mass. There is no tenderness.  Musculoskeletal: Normal range of motion.  Lymphadenopathy:    She has no cervical adenopathy.  Neurological: She is alert and oriented to person, place, and time. She has normal reflexes.  Skin: Skin is warm and dry.  Psychiatric: She has a normal mood and affect. Her behavior is normal. Judgment and thought content normal.     BP 110/73  Pulse 87  Temp(Src) 98 F (36.7 C) (Oral)  Ht 5\' 4"  (1.626 m)  Wt 198 lb (89.812 kg)  BMI 33.97 kg/m2      Assessment & Plan:   1. Hypothyroidism   2. Gout   3. Hyperlipidemia   4. Hypertension    Orders Placed This Encounter  Procedures  . CMP14+EGFR  . NMR, lipoprofile  Meds ordered this encounter  Medications  . SYNTHROID 88 MCG tablet    Sig: Take 1  tablet (88 mcg total) by mouth daily before breakfast.    Dispense:  30 tablet    Refill:  5    Order Specific Question:  Supervising Provider    Answer:  Ernestina Penna [[id number]]  . allopurinol (ZYLOPRIM) 300 MG tablet    Sig: Take 1 tablet (300 mg total) by mouth daily.    Dispense:  30 tablet    Refill:  5    Order Specific Question:  Supervising Provider    Answer:  Ernestina Penna [[id number]]  . rosuvastatin (CRESTOR) 10 MG tablet    Sig: Take 1 tablet (10 mg total) by mouth daily.    Dispense:  30 tablet    Refill:  5    Order Specific Question:  Supervising Provider    Answer:  Ernestina Penna [[id number]]  . lisinopril-hydrochlorothiazide (PRINZIDE,ZESTORETIC) 20-12.5 MG per tablet    Sig: Take 1 tablet by mouth daily.    Dispense:  30 tablet    Refill:  5    Order Specific Question:  Supervising Provider    Answer:  Deborra Medina    Continue all meds Labs pending Diet and exercise encouraged Health maintenance reviewed Follow up in 3 months   [name] Daphine Deutscher, FNP

## 2013-03-04 ENCOUNTER — Ambulatory Visit: Payer: 59 | Admitting: Nurse Practitioner

## 2013-03-14 ENCOUNTER — Other Ambulatory Visit (HOSPITAL_COMMUNITY): Payer: Self-pay | Admitting: Orthopedic Surgery

## 2013-04-02 ENCOUNTER — Encounter (HOSPITAL_COMMUNITY): Payer: Self-pay | Admitting: Pharmacy Technician

## 2013-04-03 ENCOUNTER — Ambulatory Visit (HOSPITAL_COMMUNITY)
Admission: RE | Admit: 2013-04-03 | Discharge: 2013-04-03 | Disposition: A | Discharge status: Home / Self Care | Payer: 59 | Source: Ambulatory Visit | Attending: Anesthesiology | Admitting: Anesthesiology

## 2013-04-03 ENCOUNTER — Encounter (HOSPITAL_COMMUNITY)
Admission: RE | Admit: 2013-04-03 | Discharge: 2013-04-03 | Disposition: A | Discharge status: Home / Self Care | Payer: 59 | Source: Ambulatory Visit | Attending: Orthopedic Surgery | Admitting: Orthopedic Surgery

## 2013-04-03 ENCOUNTER — Encounter (HOSPITAL_COMMUNITY): Payer: Self-pay

## 2013-04-03 DIAGNOSIS — E039 Hypothyroidism, unspecified: Secondary | ICD-10-CM | POA: Insufficient documentation

## 2013-04-03 DIAGNOSIS — E785 Hyperlipidemia, unspecified: Secondary | ICD-10-CM | POA: Insufficient documentation

## 2013-04-03 DIAGNOSIS — Z01818 Encounter for other preprocedural examination: Secondary | ICD-10-CM | POA: Insufficient documentation

## 2013-04-03 DIAGNOSIS — Z981 Arthrodesis status: Secondary | ICD-10-CM | POA: Insufficient documentation

## 2013-04-03 DIAGNOSIS — E669 Obesity, unspecified: Secondary | ICD-10-CM | POA: Insufficient documentation

## 2013-04-03 DIAGNOSIS — I10 Essential (primary) hypertension: Secondary | ICD-10-CM | POA: Insufficient documentation

## 2013-04-03 LAB — CBC
HCT: 35.6 % — ABNORMAL LOW (ref 36.0–46.0)
MCH: 33.4 pg (ref 26.0–34.0)
MCHC: 34.6 g/dL (ref 30.0–36.0)
MCV: 96.7 fL (ref 78.0–100.0)
RBC: 3.68 MIL/uL — ABNORMAL LOW (ref 3.87–5.11)
RDW: 13.8 % (ref 11.5–15.5)

## 2013-04-03 LAB — BASIC METABOLIC PANEL
BUN: 17 mg/dL (ref 6–23)
CO2: 26 mEq/L (ref 19–32)
Calcium: 10.2 mg/dL (ref 8.4–10.5)
Chloride: 99 mEq/L (ref 96–112)
Creatinine, Ser: 0.9 mg/dL (ref 0.50–1.10)
Sodium: 137 mEq/L (ref 135–145)

## 2013-04-03 MED ORDER — CHLORHEXIDINE GLUCONATE 4 % EX LIQD: 60.0000 mL | Freq: Once | CUTANEOUS | Status: DC

## 2013-04-03 NOTE — Pre-Procedure Instructions (Signed)
Lindsay Aguirre  04/03/2013   Your procedure is scheduled on:  Tuesday December 9 th at 1016 AM  Report to [facility] Short Stay Main Entrance "A" at [phone] AM.  Call this number if you have problems the morning of surgery: [phone]   Remember:   Do not eat food or drink liquids after midnight Monday 04/08/13   Take these medicines the morning of surgery with A SIP OF WATER: Synthroid and Tramadol if needed for pain.  Stop Vitamins, fish oil, NSAIDs( Diclofenac, Ibuprofen, Advil, Motrin, Aleve) and herbal medication 04/03/13   Do not wear jewelry, make-up or nail polish.  Do not wear lotions, powders, or perfumes. You may wear deodorant.  Do not shave 48 hours prior to surgery.   Do not bring valuables to the hospital.  [facility] is not responsible  for any belongings or valuables.               Contacts, dentures or bridgework may not be worn into surgery.  Leave suitcase in the car. After surgery it may be brought to your room.  For patients admitted to the hospital, discharge time is determined by your                treatment team.               Patients discharged the day of surgery will not be allowed to drive home.    Special Instructions: Shower using CHG 2 nights before surgery and the night before surgery.  If you shower the day of surgery use CHG.  Use special wash - you have one bottle of CHG for all showers.  You should use approximately 1/3 of the bottle for each shower.   Please read over the following fact sheets that you were given: Pain Booklet, Coughing and Deep Breathing and Surgical Site Infection Prevention

## 2013-04-04 NOTE — Progress Notes (Signed)
Anesthesia Chart Review:  Patient is a 62 year old female scheduled for left shoulder arthroscopy, mini open rotator cuff repair on 04/09/13 by Dr. August Saucer.  History includes non-smoker, gout, HTN, hypothyroidism, arthritis, HLD, post-operative N/V, obesity, right cataract extracation, PLIF 03/2011. PCP is Dr. Shawna Clamp.  EKG on 04/03/13 showed NSR, low voltage QRS, cannot rule out anterior infarct (age undetermined). Baseline wanderer in leads V4-6.  It was not felt significantly changed when compared to her prior EKG from 03/30/11.  No CV symptoms were documented at her PAT visit.  Preoperative CXR and labs noted.  If no acute changes then I would anticipate that she could proceed as planned.  Velna Ochs [facility] Short Stay Center/Anesthesiology Phone [phone] 04/04/2013 5:34 PM

## 2013-04-08 MED ORDER — CEFAZOLIN SODIUM-DEXTROSE 2-3 GM-% IV SOLR
2.0000 g | INTRAVENOUS | Status: AC
  Administered 2013-04-09: 2 g via INTRAVENOUS
  Filled 2013-04-08: qty 50

## 2013-04-08 NOTE — H&P (Signed)
Lindsay Aguirre is an 62 y.o. female.   Chief Complaint:  L shoulder pain. HPI: 62 yo female with insidious onset of L shoulder pain.  She denies any discreet injury.  She has failed non-operative measures.  She reports successful rotator cuff repair on the R in 2008.  She denies any neck or radicular symptoms.  No family history of DVT or PE.    Past Medical History  Diagnosis Date  . PONV (postoperative nausea and vomiting)   . Hypertension   . Hypothyroidism   . Arthritis   . Gout   . Neuromuscular disorder   . Hyperlipidemia     Past Surgical History  Procedure Laterality Date  . Cervical fusion  '99 & '03  . Rotator cuff surgery  '08    right  . Cataract extraction w/phaco  12/13/2010    Procedure: CATARACT EXTRACTION PHACO AND INTRAOCULAR LENS PLACEMENT (IOC);  Surgeon: Gemma Payor;  Location: [facility];  Service: Ophthalmology;  Laterality: Right;  CDE: 8.94  . Eye surgery    . Back surgery      2009  . Back surgery  2011    lumbar surgery  . Tubal ligation  1979    Family History  Problem Relation Age of Onset  . Anesthesia problems Neg Hx   . Hypotension Neg Hx   . Malignant hyperthermia Neg Hx   . Pseudochol deficiency Neg Hx   . Diabetes Sister   . Stroke Mother   . Heart disease Father   . Stroke Father   . Diabetes Brother   . Diabetes Sister   . Diabetes Sister   . Diabetes Sister    Social History:  reports that she has never smoked. She has never used smokeless tobacco. She reports that she does not drink alcohol or use illicit drugs.  Allergies:  Allergies  Allergen Reactions  . Livalo [Pitavastatin]   . Simvastatin     No prescriptions prior to admission    No results found for this or any previous visit (from the past 48 hour(s)). No results found.  Review of Systems  Constitutional: Negative.   HENT: Negative.   Eyes: Negative.   Respiratory: Negative.   Cardiovascular: Negative.   Gastrointestinal: Negative.   Genitourinary:  Negative.   Musculoskeletal: Positive for joint pain.  Skin: Negative.   Neurological: Negative.   Endo/Heme/Allergies: Negative.   Psychiatric/Behavioral: Negative.     There were no vitals taken for this visit. Physical Exam  Constitutional: She appears well-developed.  HENT:  Head: Normocephalic.  Eyes: Pupils are equal, round, and reactive to light.  Neck: Normal range of motion.  Cardiovascular: Normal rate.   Respiratory: Effort normal.  Neurological: She is alert.  Skin: Skin is warm.  Psychiatric: She has a normal mood and affect.   L shoulder exam demonstrates full active and passive ROM with supraspinatus weakness.  Motor sensory function of the arm intact on L.  Positive impingement signs.  No AC joint tenderness to palpation.    Assessment/Plan Impression is 62 yo female with L shoulder rotator cuff tear, 2-3 cm of retraction of supraspinatus tendon with successful repair on the R 6 years ago.  Plan is for arthroscopy, subacromial decompression, rotator cuff repair and possible biceps tenodesis.  Risk and benefits discussed.  All questions answered. Fayne Mcguffee SCOTT 04/08/2013, 9:54 PM

## 2013-04-09 ENCOUNTER — Encounter (HOSPITAL_COMMUNITY): Payer: Self-pay | Admitting: *Deleted

## 2013-04-09 ENCOUNTER — Observation Stay (HOSPITAL_COMMUNITY)
Admission: RE | Admit: 2013-04-09 | Discharge: 2013-04-10 | Disposition: A | Discharge status: Home / Self Care | Payer: 59 | Source: Ambulatory Visit | Attending: Orthopedic Surgery | Admitting: Orthopedic Surgery

## 2013-04-09 ENCOUNTER — Ambulatory Visit (HOSPITAL_COMMUNITY): Payer: 59 | Admitting: Anesthesiology

## 2013-04-09 ENCOUNTER — Encounter (HOSPITAL_COMMUNITY)
Admission: RE | Disposition: A | Discharge status: Home / Self Care | Payer: Self-pay | Source: Ambulatory Visit | Attending: Orthopedic Surgery

## 2013-04-09 ENCOUNTER — Encounter (HOSPITAL_COMMUNITY): Payer: 59 | Admitting: Vascular Surgery

## 2013-04-09 DIAGNOSIS — Z5333 Arthroscopic surgical procedure converted to open procedure: Secondary | ICD-10-CM | POA: Insufficient documentation

## 2013-04-09 DIAGNOSIS — M75102 Unspecified rotator cuff tear or rupture of left shoulder, not specified as traumatic: Secondary | ICD-10-CM

## 2013-04-09 DIAGNOSIS — M75120 Complete rotator cuff tear or rupture of unspecified shoulder, not specified as traumatic: Principal | ICD-10-CM | POA: Insufficient documentation

## 2013-04-09 DIAGNOSIS — E039 Hypothyroidism, unspecified: Secondary | ICD-10-CM | POA: Insufficient documentation

## 2013-04-09 DIAGNOSIS — M67919 Unspecified disorder of synovium and tendon, unspecified shoulder: Secondary | ICD-10-CM | POA: Insufficient documentation

## 2013-04-09 DIAGNOSIS — I10 Essential (primary) hypertension: Secondary | ICD-10-CM | POA: Insufficient documentation

## 2013-04-09 DIAGNOSIS — E785 Hyperlipidemia, unspecified: Secondary | ICD-10-CM | POA: Insufficient documentation

## 2013-04-09 DIAGNOSIS — M719 Bursopathy, unspecified: Secondary | ICD-10-CM | POA: Insufficient documentation

## 2013-04-09 DIAGNOSIS — M75100 Unspecified rotator cuff tear or rupture of unspecified shoulder, not specified as traumatic: Secondary | ICD-10-CM | POA: Diagnosis present

## 2013-04-09 HISTORY — PX: ROTATOR CUFF REPAIR: SHX139

## 2013-04-09 HISTORY — PX: SHOULDER ARTHROSCOPY WITH SUBACROMIAL DECOMPRESSION AND OPEN ROTATOR C: SHX5688

## 2013-04-09 HISTORY — DX: Family history of other specified conditions: Z84.89

## 2013-04-09 SURGERY — SHOULDER ARTHROSCOPY WITH SUBACROMIAL DECOMPRESSION AND OPEN ROTATOR CUFF REPAIR, OPEN BICEPS TENDON REPAIR
Anesthesia: General | Site: Shoulder | Laterality: Left

## 2013-04-09 MED ORDER — OXYCODONE HCL 5 MG PO TABS: 5.0000 mg | ORAL_TABLET | Freq: Once | ORAL | Status: DC | PRN

## 2013-04-09 MED ORDER — PROPOFOL 10 MG/ML IV BOLUS
INTRAVENOUS | Status: DC | PRN
  Administered 2013-04-09: 30 mg via INTRAVENOUS
  Administered 2013-04-09: 140 mg via INTRAVENOUS

## 2013-04-09 MED ORDER — BUPIVACAINE HCL (PF) 0.25 % IJ SOLN
INTRAMUSCULAR | Status: AC
  Filled 2013-04-09: qty 10

## 2013-04-09 MED ORDER — ACETAMINOPHEN 325 MG PO TABS: 650.0000 mg | ORAL_TABLET | Freq: Four times a day (QID) | ORAL | Status: DC | PRN

## 2013-04-09 MED ORDER — FENTANYL CITRATE 0.05 MG/ML IJ SOLN
INTRAMUSCULAR | Status: DC | PRN
  Administered 2013-04-09 (×2): 50 ug via INTRAVENOUS

## 2013-04-09 MED ORDER — ACETAMINOPHEN 650 MG RE SUPP: 650.0000 mg | Freq: Four times a day (QID) | RECTAL | Status: DC | PRN

## 2013-04-09 MED ORDER — PHENYLEPHRINE HCL 10 MG/ML IJ SOLN
10.0000 mg | INTRAVENOUS | Status: DC | PRN
  Administered 2013-04-09: 25 ug/min via INTRAVENOUS

## 2013-04-09 MED ORDER — OXYCODONE HCL 5 MG/5ML PO SOLN: 5.0000 mg | Freq: Once | ORAL | Status: DC | PRN

## 2013-04-09 MED ORDER — ASPIRIN 325 MG PO TABS
325.0000 mg | ORAL_TABLET | Freq: Every day | ORAL | Status: DC
  Administered 2013-04-09 – 2013-04-10 (×2): 325 mg via ORAL
  Filled 2013-04-09 (×2): qty 1

## 2013-04-09 MED ORDER — LISINOPRIL 20 MG PO TABS
20.0000 mg | ORAL_TABLET | Freq: Every day | ORAL | Status: DC
  Administered 2013-04-10: 20 mg via ORAL
  Filled 2013-04-09 (×2): qty 1

## 2013-04-09 MED ORDER — LACTATED RINGERS IV SOLN
INTRAVENOUS | Status: DC
  Administered 2013-04-09 (×2): via INTRAVENOUS

## 2013-04-09 MED ORDER — METHOCARBAMOL 500 MG PO TABS
500.0000 mg | ORAL_TABLET | Freq: Four times a day (QID) | ORAL | Status: DC | PRN
  Administered 2013-04-10: 500 mg via ORAL
  Filled 2013-04-09: qty 1

## 2013-04-09 MED ORDER — LIDOCAINE HCL 1 % IJ SOLN
INTRAMUSCULAR | Status: DC | PRN
  Administered 2013-04-09: 1 mL via INTRADERMAL
  Administered 2013-04-09: 30 mL via INTRADERMAL

## 2013-04-09 MED ORDER — MORPHINE SULFATE 4 MG/ML IJ SOLN
INTRAMUSCULAR | Status: AC
  Filled 2013-04-09: qty 1

## 2013-04-09 MED ORDER — PHENOL 1.4 % MT LIQD: 1.0000 | OROMUCOSAL | Status: DC | PRN

## 2013-04-09 MED ORDER — EPHEDRINE SULFATE 50 MG/ML IJ SOLN
INTRAMUSCULAR | Status: DC | PRN
  Administered 2013-04-09: 5 mg via INTRAVENOUS
  Administered 2013-04-09: 10 mg via INTRAVENOUS

## 2013-04-09 MED ORDER — CLONIDINE HCL (ANALGESIA) 100 MCG/ML EP SOLN
EPIDURAL | Status: DC | PRN
  Administered 2013-04-09: .75 mL

## 2013-04-09 MED ORDER — DEXAMETHASONE SODIUM PHOSPHATE 4 MG/ML IJ SOLN
INTRAMUSCULAR | Status: DC | PRN
  Administered 2013-04-09: 8 mg via INTRAVENOUS

## 2013-04-09 MED ORDER — GLYCOPYRROLATE 0.2 MG/ML IJ SOLN
INTRAMUSCULAR | Status: DC | PRN
  Administered 2013-04-09: 0.4 mg via INTRAVENOUS

## 2013-04-09 MED ORDER — ROCURONIUM BROMIDE 100 MG/10ML IV SOLN
INTRAVENOUS | Status: DC | PRN
  Administered 2013-04-09: 50 mg via INTRAVENOUS

## 2013-04-09 MED ORDER — NEOSTIGMINE METHYLSULFATE 1 MG/ML IJ SOLN
INTRAMUSCULAR | Status: DC | PRN
  Administered 2013-04-09: 3 mg via INTRAVENOUS

## 2013-04-09 MED ORDER — EPINEPHRINE HCL 1 MG/ML IJ SOLN
INTRAMUSCULAR | Status: DC | PRN
  Administered 2013-04-09: .15 mL

## 2013-04-09 MED ORDER — DICLOFENAC SODIUM 50 MG PO TBEC
50.0000 mg | DELAYED_RELEASE_TABLET | Freq: Two times a day (BID) | ORAL | Status: DC | PRN
  Filled 2013-04-09: qty 1

## 2013-04-09 MED ORDER — FENTANYL CITRATE 0.05 MG/ML IJ SOLN
INTRAMUSCULAR | Status: AC
Start: 2013-04-09 — End: 2013-04-09
  Administered 2013-04-09: 50 ug
  Filled 2013-04-09: qty 2

## 2013-04-09 MED ORDER — METHOCARBAMOL 100 MG/ML IJ SOLN
500.0000 mg | Freq: Four times a day (QID) | INTRAVENOUS | Status: DC | PRN
  Filled 2013-04-09: qty 5

## 2013-04-09 MED ORDER — METOCLOPRAMIDE HCL 5 MG/ML IJ SOLN: 10.0000 mg | Freq: Once | INTRAMUSCULAR | Status: DC | PRN

## 2013-04-09 MED ORDER — MORPHINE SULFATE 2 MG/ML IJ SOLN: 1.0000 mg | INTRAMUSCULAR | Status: DC | PRN

## 2013-04-09 MED ORDER — BUPIVACAINE HCL (PF) 0.25 % IJ SOLN
INTRAMUSCULAR | Status: DC | PRN
  Administered 2013-04-09: 5 mL

## 2013-04-09 MED ORDER — METOCLOPRAMIDE HCL 5 MG/ML IJ SOLN
5.0000 mg | Freq: Three times a day (TID) | INTRAMUSCULAR | Status: DC | PRN
  Administered 2013-04-10: 10 mg via INTRAVENOUS
  Filled 2013-04-09 (×2): qty 2

## 2013-04-09 MED ORDER — MENTHOL 3 MG MT LOZG: 1.0000 | LOZENGE | OROMUCOSAL | Status: DC | PRN

## 2013-04-09 MED ORDER — SODIUM CHLORIDE 0.9 % IR SOLN
Status: DC | PRN
  Administered 2013-04-09: 6000 mL

## 2013-04-09 MED ORDER — MIDAZOLAM HCL 2 MG/2ML IJ SOLN
INTRAMUSCULAR | Status: AC
  Administered 2013-04-09: 2 mg
  Filled 2013-04-09: qty 2

## 2013-04-09 MED ORDER — ROPIVACAINE HCL 5 MG/ML IJ SOLN
INTRAMUSCULAR | Status: DC | PRN
  Administered 2013-04-09: 20 mL via PERINEURAL

## 2013-04-09 MED ORDER — LIDOCAINE HCL (CARDIAC) 20 MG/ML IV SOLN
INTRAVENOUS | Status: DC | PRN
  Administered 2013-04-09: 30 mg via INTRAVENOUS

## 2013-04-09 MED ORDER — CEFAZOLIN SODIUM-DEXTROSE 2-3 GM-% IV SOLR
2.0000 g | Freq: Three times a day (TID) | INTRAVENOUS | Status: AC
  Administered 2013-04-09 – 2013-04-10 (×2): 2 g via INTRAVENOUS
  Filled 2013-04-09 (×2): qty 50

## 2013-04-09 MED ORDER — ALLOPURINOL 300 MG PO TABS
300.0000 mg | ORAL_TABLET | Freq: Every day | ORAL | Status: DC
Start: 2013-04-09 — End: 2013-04-10
  Administered 2013-04-09 – 2013-04-10 (×2): 300 mg via ORAL
  Filled 2013-04-09 (×2): qty 1

## 2013-04-09 MED ORDER — METOCLOPRAMIDE HCL 5 MG PO TABS
5.0000 mg | ORAL_TABLET | Freq: Three times a day (TID) | ORAL | Status: DC | PRN
  Filled 2013-04-09: qty 2

## 2013-04-09 MED ORDER — SCOPOLAMINE 1 MG/3DAYS TD PT72
MEDICATED_PATCH | TRANSDERMAL | Status: AC
  Filled 2013-04-09: qty 1

## 2013-04-09 MED ORDER — LISINOPRIL-HYDROCHLOROTHIAZIDE 20-12.5 MG PO TABS
1.0000 | ORAL_TABLET | Freq: Every day | ORAL | Status: DC
Start: 2013-04-09 — End: 2013-04-09

## 2013-04-09 MED ORDER — METOCLOPRAMIDE HCL 5 MG/ML IJ SOLN
INTRAMUSCULAR | Status: DC | PRN
  Administered 2013-04-09: 10 mg via INTRAVENOUS

## 2013-04-09 MED ORDER — HYDROMORPHONE HCL PF 1 MG/ML IJ SOLN: 0.2500 mg | INTRAMUSCULAR | Status: DC | PRN

## 2013-04-09 MED ORDER — CHLORHEXIDINE GLUCONATE 4 % EX LIQD: 60.0000 mL | Freq: Once | CUTANEOUS | Status: DC

## 2013-04-09 MED ORDER — ONDANSETRON HCL 4 MG/2ML IJ SOLN
4.0000 mg | Freq: Four times a day (QID) | INTRAMUSCULAR | Status: DC | PRN
  Administered 2013-04-09 (×2): 4 mg via INTRAVENOUS
  Filled 2013-04-09 (×2): qty 2

## 2013-04-09 MED ORDER — HYDROCHLOROTHIAZIDE 12.5 MG PO CAPS
12.5000 mg | ORAL_CAPSULE | Freq: Every day | ORAL | Status: DC
  Administered 2013-04-10: 12.5 mg via ORAL
  Filled 2013-04-09 (×2): qty 1

## 2013-04-09 MED ORDER — OXYCODONE HCL 5 MG PO TABS
5.0000 mg | ORAL_TABLET | ORAL | Status: DC | PRN
  Administered 2013-04-09: 5 mg via ORAL
  Administered 2013-04-10: 10 mg via ORAL
  Filled 2013-04-09 (×2): qty 2

## 2013-04-09 MED ORDER — EPINEPHRINE HCL 1 MG/ML IJ SOLN
INTRAMUSCULAR | Status: AC
  Filled 2013-04-09: qty 1

## 2013-04-09 MED ORDER — LEVOTHYROXINE SODIUM 88 MCG PO TABS
88.0000 ug | ORAL_TABLET | Freq: Every day | ORAL | Status: DC
  Administered 2013-04-10: 88 ug via ORAL
  Filled 2013-04-09 (×2): qty 1

## 2013-04-09 MED ORDER — SODIUM CHLORIDE 0.9 % IJ SOLN
INTRAMUSCULAR | Status: DC | PRN
  Administered 2013-04-09: 20 mL

## 2013-04-09 MED ORDER — MORPHINE SULFATE 4 MG/ML IJ SOLN
INTRAMUSCULAR | Status: DC | PRN
  Administered 2013-04-09: 4 mg

## 2013-04-09 MED ORDER — ONDANSETRON HCL 4 MG PO TABS
4.0000 mg | ORAL_TABLET | Freq: Four times a day (QID) | ORAL | Status: DC | PRN
  Administered 2013-04-10: 4 mg via ORAL
  Filled 2013-04-09: qty 1

## 2013-04-09 MED ORDER — ONDANSETRON HCL 4 MG/2ML IJ SOLN
INTRAMUSCULAR | Status: DC | PRN
  Administered 2013-04-09 (×2): 4 mg via INTRAVENOUS

## 2013-04-09 SURGICAL SUPPLY — 80 items
ANCH SUT 2 FT CRKSCW 14.7 STRL (Anchor) ×2 IMPLANT
ANCH SUT PUSHLCK 24X4.5 STRL (Orthopedic Implant) ×2 IMPLANT
ANCHOR CORKSCREW BIO 5.5 FT (Anchor) ×2 IMPLANT
APL SKNCLS STERI-STRIP NONHPOA (GAUZE/BANDAGES/DRESSINGS) ×1
BENZOIN TINCTURE PRP APPL 2/3 (GAUZE/BANDAGES/DRESSINGS) ×2 IMPLANT
BIT DRILL TAK (DRILL) IMPLANT
BLADE CUDA 5.5 (BLADE) IMPLANT
BLADE CUTTER GATOR 3.5 (BLADE) ×2 IMPLANT
BLADE GREAT WHITE 4.2 (BLADE) ×2 IMPLANT
BLADE SURG 11 STRL SS (BLADE) ×2 IMPLANT
BUR GATOR 2.9 (BURR) IMPLANT
BUR OVAL 6.0 (BURR) ×2 IMPLANT
CANNULA SHOULDER 7CM (CANNULA) IMPLANT
CARTRIDGE CURVETEK MED (MISCELLANEOUS) IMPLANT
CARTRIDGE CURVETEK XLRG (MISCELLANEOUS) IMPLANT
CLOTH BEACON ORANGE TIMEOUT ST (SAFETY) ×2 IMPLANT
CLSR STERI-STRIP ANTIMIC 1/2X4 (GAUZE/BANDAGES/DRESSINGS) ×1 IMPLANT
COVER SURGICAL LIGHT HANDLE (MISCELLANEOUS) ×2 IMPLANT
DRAPE INCISE IOBAN 66X45 STRL (DRAPES) ×4 IMPLANT
DRAPE STERI 35X30 U-POUCH (DRAPES) ×2 IMPLANT
DRAPE U-SHAPE 47X51 STRL (DRAPES) ×4 IMPLANT
DRILL TAK (DRILL)
DRSG PAD ABDOMINAL 8X10 ST (GAUZE/BANDAGES/DRESSINGS) ×4 IMPLANT
DURAPREP 26ML APPLICATOR (WOUND CARE) ×2 IMPLANT
ELECT MENISCUS 165MM 90D (ELECTRODE) IMPLANT
ELECT REM PT RETURN 9FT ADLT (ELECTROSURGICAL) ×2
ELECTRODE REM PT RTRN 9FT ADLT (ELECTROSURGICAL) ×1 IMPLANT
FILTER STRAW FLUID ASPIR (MISCELLANEOUS) ×2 IMPLANT
GAUZE XEROFORM 1X8 LF (GAUZE/BANDAGES/DRESSINGS) ×2 IMPLANT
GLOVE BIO SURGEON ST LM GN SZ9 (GLOVE) ×2 IMPLANT
GLOVE BIOGEL PI IND STRL 8 (GLOVE) ×1 IMPLANT
GLOVE BIOGEL PI INDICATOR 8 (GLOVE) ×1
GLOVE SURG ORTHO 8.0 STRL STRW (GLOVE) ×3 IMPLANT
GOWN PREVENTION PLUS LG XLONG (DISPOSABLE) IMPLANT
GOWN STRL NON-REIN LRG LVL3 (GOWN DISPOSABLE) ×6 IMPLANT
KIT BASIN OR (CUSTOM PROCEDURE TRAY) ×2 IMPLANT
KIT ROOM TURNOVER OR (KITS) ×2 IMPLANT
MANIFOLD NEPTUNE II (INSTRUMENTS) ×2 IMPLANT
NDL HYPO 25X1 1.5 SAFETY (NEEDLE) ×1 IMPLANT
NDL SCORPION MULTI FIRE (NEEDLE) IMPLANT
NDL SPNL 18GX3.5 QUINCKE PK (NEEDLE) ×1 IMPLANT
NDL SUT 6 .5 CRC .975X.05 MAYO (NEEDLE) ×1 IMPLANT
NEEDLE HYPO 25X1 1.5 SAFETY (NEEDLE) ×2 IMPLANT
NEEDLE MAYO TAPER (NEEDLE) ×2
NEEDLE SCORPION MULTI FIRE (NEEDLE) ×2 IMPLANT
NEEDLE SPNL 18GX3.5 QUINCKE PK (NEEDLE) ×2 IMPLANT
NS IRRIG 1000ML POUR BTL (IV SOLUTION) ×2 IMPLANT
PACK SHOULDER (CUSTOM PROCEDURE TRAY) ×2 IMPLANT
PAD ARMBOARD 7.5X6 YLW CONV (MISCELLANEOUS) ×4 IMPLANT
PUSHLOCK PEEK 4.5X24 (Orthopedic Implant) ×2 IMPLANT
SET ARTHROSCOPY TUBING (MISCELLANEOUS) ×2
SET ARTHROSCOPY TUBING LN (MISCELLANEOUS) ×1 IMPLANT
SLEEVE SURGEON STRL (DRAPES) ×1 IMPLANT
SLING ARM IMMOBILIZER MED (SOFTGOODS) IMPLANT
SPEAR FASTAKII (SLEEVE) IMPLANT
SPONGE GAUZE 4X4 12PLY (GAUZE/BANDAGES/DRESSINGS) ×2 IMPLANT
SPONGE LAP 4X18 X RAY DECT (DISPOSABLE) ×4 IMPLANT
STRIP CLOSURE SKIN 1/2X4 (GAUZE/BANDAGES/DRESSINGS) ×2 IMPLANT
SUCTION FRAZIER TIP 10 FR DISP (SUCTIONS) ×2 IMPLANT
SUT ETHILON 3 0 PS 1 (SUTURE) ×2 IMPLANT
SUT FIBERWIRE #2 38 T-5 BLUE (SUTURE) ×4
SUT FIBERWIRE 2-0 18 17.9 3/8 (SUTURE) ×2
SUT PROLENE 3 0 PS 2 (SUTURE) ×2 IMPLANT
SUT VIC AB 0 CT1 27 (SUTURE) ×4
SUT VIC AB 0 CT1 27XBRD ANBCTR (SUTURE) ×2 IMPLANT
SUT VIC AB 1 CT1 27 (SUTURE) ×2
SUT VIC AB 1 CT1 27XBRD ANBCTR (SUTURE) ×1 IMPLANT
SUT VIC AB 2-0 CT1 27 (SUTURE) ×2
SUT VIC AB 2-0 CT1 TAPERPNT 27 (SUTURE) ×1 IMPLANT
SUT VICRYL 0 UR6 27IN ABS (SUTURE) ×4 IMPLANT
SUTURE FIBERWR #2 38 T-5 BLUE (SUTURE) IMPLANT
SUTURE FIBERWR 2-0 18 17.9 3/8 (SUTURE) IMPLANT
SYR 20CC LL (SYRINGE) ×4 IMPLANT
SYR 3ML LL SCALE MARK (SYRINGE) ×2 IMPLANT
SYR TB 1ML LUER SLIP (SYRINGE) ×2 IMPLANT
TAPE CLOTH SURG 4X10 WHT LF (GAUZE/BANDAGES/DRESSINGS) ×1 IMPLANT
TOWEL OR 17X24 6PK STRL BLUE (TOWEL DISPOSABLE) ×2 IMPLANT
TOWEL OR 17X26 10 PK STRL BLUE (TOWEL DISPOSABLE) ×2 IMPLANT
WAND 90 DEG TURBOVAC W/CORD (SURGICAL WAND) IMPLANT
WATER STERILE IRR 1000ML POUR (IV SOLUTION) ×2 IMPLANT

## 2013-04-09 NOTE — Anesthesia Preprocedure Evaluation (Addendum)
Anesthesia Evaluation  Patient identified by MRN, date of birth, ID band Patient awake    Reviewed: Allergy & Precautions, H&P , NPO status , Patient's Chart, lab work & pertinent test results, reviewed documented beta blocker date and time   History of Anesthesia Complications (+) PONV and history of anesthetic complications  Airway Mallampati: II TM Distance: >3 FB Neck ROM: full    Dental  (+) Teeth Intact   Pulmonary neg pulmonary ROS,  breath sounds clear to auscultation        Cardiovascular hypertension, On Medications Rhythm:regular     Neuro/Psych  Neuromuscular disease negative psych ROS   GI/Hepatic negative GI ROS, Neg liver ROS,   Endo/Other  Hypothyroidism   Renal/GU negative Renal ROS  negative genitourinary   Musculoskeletal   Abdominal   Peds  Hematology negative hematology ROS (+)   Anesthesia Other Findings See surgeon's H&P   Reproductive/Obstetrics negative OB ROS                          Anesthesia Physical Anesthesia Plan  ASA: III  Anesthesia Plan: General   Post-op Pain Management:    Induction: Intravenous  Airway Management Planned: Oral ETT  Additional Equipment:   Intra-op Plan:   Post-operative Plan: Extubation in OR  Informed Consent: I have reviewed the patients History and Physical, chart, labs and discussed the procedure including the risks, benefits and alternatives for the proposed anesthesia with the patient or authorized representative who has indicated his/her understanding and acceptance.   Dental Advisory Given  Plan Discussed with: CRNA and Surgeon  Anesthesia Plan Comments:         Anesthesia Quick Evaluation

## 2013-04-09 NOTE — Brief Op Note (Signed)
04/09/2013  12:23 PM  PATIENT:  Lindsay Aguirre  62 y.o. female  PRE-OPERATIVE DIAGNOSIS:  LEFT SHOULDER ROTATOR CUFF TEAR, BURSITIS  POST-OPERATIVE DIAGNOSIS:  LEFT SHOULDER ROTATOR CUFF TEAR, BURSITIS  PROCEDURE:  Procedure(s): SHOULDER ARTHROSCOPY WITH SUBACROMIAL DECOMPRESSION AND MINI OPEN ROTATOR CUFF REPAIR,  SURGEON:  Surgeon(s): Cammy Copa, MD  ASSISTANT: s vernon pa  ANESTHESIA:   general  EBL: 75 ml    Total I/O In: 1000 [I.V.:1000] Out: 75 [Blood:75]  BLOOD ADMINISTERED: none  DRAINS: none   LOCAL MEDICATIONS USED:  none  SPECIMEN:  No Specimen  COUNTS:  YES  TOURNIQUET:  * No tourniquets in log *  DICTATION: .Other Dictation: Dictation Number [id number]  PLAN OF CARE: Admit for overnight observation  PATIENT DISPOSITION:  PACU - hemodynamically stable

## 2013-04-09 NOTE — Preoperative (Signed)
Beta Blockers   Reason not to administer Beta Blockers:Not Applicable 

## 2013-04-09 NOTE — Transfer of Care (Signed)
Immediate Anesthesia Transfer of Care Note  Patient: Lindsay Aguirre  Procedure(s) Performed: Procedure(s): SHOULDER ARTHROSCOPY WITH SUBACROMIAL DECOMPRESSION AND MINI OPEN ROTATOR CUFF REPAIR, POSSIBLE BICEP TENDONODESIS,   (Left)  Patient Location: PACU  Anesthesia Type:General  Level of Consciousness: awake, alert  and oriented  Airway & Oxygen Therapy: Patient Spontanous Breathing and Patient connected to nasal cannula oxygen  Post-op Assessment: Report given to PACU RN, Post -op Vital signs reviewed and stable and Patient moving all extremities X 4  Post vital signs: Reviewed and stable  Complications: No apparent anesthesia complications

## 2013-04-09 NOTE — Interval H&P Note (Signed)
History and Physical Interval Note:  04/09/2013 9:30 AM  Lindsay Aguirre  has presented today for surgery, with the diagnosis of LEFT SHOULDER ROTATOR CUFF TEAR, BURSITIS  The various methods of treatment have been discussed with the patient and family. After consideration of risks, benefits and other options for treatment, the patient has consented to  Procedure(s): SHOULDER ARTHROSCOPY WITH SUBACROMIAL DECOMPRESSION AND MINI OPEN ROTATOR CUFF REPAIR, POSSIBLE BICEP TENDONODESIS,   (Left) as a surgical intervention .  The patient's history has been reviewed, patient examined, no change in status, stable for surgery.  I have reviewed the patient's chart and labs.  Questions were answered to the patient's satisfaction. Plan doa and rct repair - this tear is larger than her right sided taer - biceps tenodesis may be required.    Lindsay Aguirre

## 2013-04-09 NOTE — Anesthesia Procedure Notes (Addendum)
Anesthesia Regional Block:  Interscalene brachial plexus block  Pre-Anesthetic Checklist: ,, timeout performed, Correct Patient, Correct Site, Correct Laterality, Correct Procedure, Correct Position, site marked, Risks and benefits discussed,  Surgical consent,  Pre-op evaluation,  At surgeon's request and post-op pain management  Laterality: Left  Prep: chloraprep       Needles:   Needle Type: Other     Needle Length: 9cm  Needle Gauge: 21 and 21 G    Additional Needles:  Procedures: ultrasound guided (picture in chart) Interscalene brachial plexus block Narrative:  Start time: 04/09/2013 9:18 AM End time: 04/09/2013 9:24 AM Injection made incrementally with aspirations every 5 mL.  Performed by: Personally  Anesthesiologist: Aldona Lento, MD  Additional Notes: Ultrasound guidance used to: id relevant anatomy, confirm needle position, local anesthetic spread, avoidance of vascular puncture. Picture saved. No complications. Block performed personally by Janetta Hora. Gelene Mink, MD     Procedure Name: Intubation Date/Time: 04/09/2013 10:01 AM Performed by: Quentin Ore Pre-anesthesia Checklist: Patient identified, Emergency Drugs available, Suction available, Patient being monitored and Timeout performed Patient Re-evaluated:Patient Re-evaluated prior to inductionOxygen Delivery Method: Circle system utilized Preoxygenation: Pre-oxygenation with 100% oxygen Intubation Type: IV induction Ventilation: Mask ventilation without difficulty and Oral airway inserted - appropriate to patient size Laryngoscope Size: Mac and 4 Grade View: Grade II Tube type: Oral Tube size: 7.0 mm Number of attempts: 1 Airway Equipment and Method: Stylet Placement Confirmation: ETT inserted through vocal cords under direct vision,  positive ETCO2 and breath sounds checked- equal and bilateral Secured at: 21 cm Tube secured with: Tape Dental Injury: Teeth and Oropharynx as per pre-operative  assessment

## 2013-04-09 NOTE — Progress Notes (Signed)
Arrived on floor at 1340. Waiting on Nettie Elm RN to report off to

## 2013-04-10 ENCOUNTER — Encounter (HOSPITAL_COMMUNITY): Payer: Self-pay | Admitting: General Practice

## 2013-04-10 MED ORDER — OXYCODONE HCL 5 MG PO TABS: 5.0000 mg | ORAL_TABLET | ORAL | Status: DC | PRN

## 2013-04-10 MED ORDER — METHOCARBAMOL 500 MG PO TABS: 500.0000 mg | ORAL_TABLET | Freq: Four times a day (QID) | ORAL | Status: DC | PRN

## 2013-04-10 NOTE — Progress Notes (Signed)
Utilization review completed.  

## 2013-04-10 NOTE — Op Note (Signed)
NAME:  Lindsay Aguirre, GUNNERSON NO.:  [id number]  MEDICAL RECORD NO.:  [id number]  LOCATION:  [id number]                        FACILITY:  [facility]  PHYSICIAN:  Burnard Bunting, M.D.    DATE OF BIRTH:  [birth year]  DATE OF PROCEDURE: DATE OF DISCHARGE:                              OPERATIVE REPORT   PREOPERATIVE DIAGNOSIS:  Left shoulder rotator cuff tear and bursitis.  POSTOPERATIVE DIAGNOSIS:  Left shoulder rotator cuff tear and bursitis.  PROCEDURE:  Left shoulder diagnostic arthroscopy with debridement of labrum and some synovitis and mini open rotator cuff repair with significant large rotator cuff tendon tear.  SURGEONS:  Burnard Bunting, MD  ASSISTANT:  Wende Neighbors, PA  ANESTHESIA:  General endotracheal.  ESTIMATED BLOOD LOSS:  25 mL.  INDICATIONS:  Lindsay Aguirre is a patient with left shoulder pain.  She had a right shoulder rotator cuff tear 6 years ago, did well with that.  She now presents with left shoulder rotator cuff tear, presents for operative management after explanation of risks and benefits and failure of conservative therapy.  OPERATIVE FINDINGS: 1. Examination under anesthesia, range of motion, full forward     flexion, abduction and external rotation to 70 degrees at 15     degrees of abduction.  Shoulder stability is excellent.  Anterior,     posterior, and inferior susceptibility testing. 2. Diagnostic arthroscopy. 3. A.  Torn biceps tendon. 4. B.  Some fraying around the remaining labrum. 5. C.  V shaped tear of the supraspinatus extending all the way back     to the glenoid rim and the deep portion measured about 2 cm.     Significant bursitis is present as well.  DESCRIPTION OF PROCEDURE:  The patient was brought to operating room, where general endotracheal anesthesia was induced.  Preoperative antibiotics were administered.  Time-out was called.  The patient was placed in a beach-chair position with the head in neutral position. Left arm  shoulder and hand was prescrubbed with alcohol and Betadine, which was allowed to air dry, prepped with DuraPrep solution and draped in a sterile manner.  Collier Flowers was used to cover the axilla.  Posterior portal created medial and inferior to the posterolateral margin acromion.  Anterior portal created under direct visualization, arthroscopy performed.  Patient was noted to have potentially repairable rotator cuff tendon tear,  biceps tendon was already torn.  The tear was mobilized through creation of a lateral portal, subacromial decompression performed, bursectomy performed.  Lateral CA ligament was preserved, undersurface clavicle spurring removed.  At this time, instruments removed from the portals, portals closed using 3-0 nylon. Ioban applied to the operative field.  Anterolateral marked incision made, deltoid split measured distance of 4 cm with the suture placed at the apex.  Deltoid partially detached for about a cm on the acromion for visualization, rotator cuff tear identified.  Footprint was curetted. Tear was debrided of necrotic appearing tissue, 0 Vicryl sutures were then used to close the V portion of the tear, however 2 cm distance, 2 push, 2 corkscrew suture anchors were placed, and 4 horizontal mattress sutures were placed.  The Vicryl sutures were then used to close the V up to  the footprint and then the mattress sutures were tied and brought down over the humeral head in mattress-type fashion and secured with 2 push locks.  Watertight repair was achieved.  Good range of motion was achieved, thorough irrigation was then performed.  Deltoid split repaired through 1 drill hole for the detached portion medially using a 0 FiberWire and then deltoid split was closed using #1 Vicryl suture, skin closed using interrupted inverted 2-0 Vicryl, 0 Vicryl suture running 3-0 Prolene.  Shoulder immobilizer applied.  The patient tolerated the procedure well without immediate  complications.  Dressing applied.  Velna Hatchet Vernon's assistance required at all times during the case for retraction for neurovascular structures.  Her assistance was a medical necessity for opening and closing, and retraction in anchor placement.     Burnard Bunting, M.D.     GSD/MEDQ  D:  04/09/2013  T:  04/10/2013  Job:  [id number]

## 2013-04-10 NOTE — Evaluation (Signed)
Occupational Therapy Evaluation Patient Details Name: Lindsay Aguirre MRN: [id number] DOB: 1950/07/19 Today's Date: 04/10/2013 Time: 4098-1191 OT Time Calculation (min): 47 min  OT Assessment / Plan / Recommendation History of present illness Pt is a 62 y/o s/p Left shoulder arthroscopy w/ subacromial decompression & open rotator cuff repair per Dr. August Saucer on 04/09/13. Note pt underwent right rotator cuff repair approximately 1 year ago.   Clinical Impression   Pt presents w/ dx as above now impacting her ability to perform ADL's independently. She was educated in shoulder/sling use as per Dr, Diamantina Providence approved protocol to include Pendulums, AROM to elbow, wrist & hand, sling use, bathing/dressing techniques & sleep positioning. Pt plans to d/c home later today, recommend she f/u for rehab of the shoulder as ordered by MD at f/u appointment. She reports no further needs at this time, will sign off.    OT Assessment  Progress rehab of shoulder as ordered by MD at follow-up appointment    Follow Up Recommendations  No OT follow up    Barriers to Discharge      Equipment Recommendations  None recommended by OT    Recommendations for Other Services    Frequency       Precautions / Restrictions Precautions Precautions: Fall Required Braces or Orthoses: Sling (Left shoulder sling on except for bathing/dressing & ther ex)   Pertinent Vitals/Pain No c/o, sore. Repositioned, rest, ice applied after treatment session.    ADL  Eating/Feeding: Performed;Modified independent Where Assessed - Eating/Feeding: Chair;Edge of bed Grooming: Performed;Wash/dry hands;Wash/dry face;Teeth care;Modified independent Where Assessed - Grooming: Supported sitting Upper Body Bathing: Performed;Set up Where Assessed - Upper Body Bathing: Supported sitting Lower Body Bathing: Performed;Set up;Modified independent Where Assessed - Lower Body Bathing: Supported sit to stand Upper Body Dressing:  Performed;Minimal assistance Where Assessed - Upper Body Dressing: Unsupported sitting Lower Body Dressing: Simulated;Supervision/safety;Min guard Where Assessed - Lower Body Dressing: Supported sit to stand Toilet Transfer: Performed;Modified independent Toilet Transfer Method: Sit to Barista: Comfort height toilet Toileting - Clothing Manipulation and Hygiene: Performed;Modified independent Where Assessed - Toileting Clothing Manipulation and Hygiene: Standing Tub/Shower Transfer Method: Not assessed Equipment Used: Other (comment) (LUE sling) Transfers/Ambulation Related to ADLs: Pt is Mod I bed mobility and functional transfers.  ADL Comments: Pt particiapted in full ADL w/ set up this am. Discussed bathing/dressing techniques as well as sling don/doff, sleeping positions. Pt verbalized understanding of all of the above. She was also educated in and performed HEP per Dr. Alfonso Patten approved protocol (pendulums, AROM elbow, wrist and hand/digits). Pt demonstrated good tech. Pt plans to d/c home later today w/ family PRN assist.    OT Diagnosis:    OT Problem List:   OT Treatment Interventions:     OT Goals(Current goals can be found in the care plan section) Acute Rehab OT Goals Patient Stated Goal: Home later today w/ family assistance PRN  Visit Information  Last OT Received On: 04/10/13 Assistance Needed: +1 History of Present Illness: Pt is a 62 y/o s/p Left shoulder arthroscopy w/ subacromial decompression & open rotator cuff repair per Dr. August Saucer on 04/09/13. Note pt underwent right rotator cuff repair approximately 1 year ago.       Prior Functioning     Home Living Family/patient expects to be discharged to:: Private residence Living Arrangements: Children Available Help at Discharge: Family;Available 24 hours/day Type of Home: House Home Access: Level entry Home Layout: One level Home Equipment: Bedside commode;Tub bench Prior Function Level  of  Independence: Independent Communication Communication: No difficulties Dominant Hand: Right    Vision/Perception Vision - History Baseline Vision: Wears glasses all the time Patient Visual Report: No change from baseline   Cognition  Cognition Arousal/Alertness: Awake/alert Behavior During Therapy: WFL for tasks assessed/performed Overall Cognitive Status: Within Functional Limits for tasks assessed    Extremity/Trunk Assessment Upper Extremity Assessment Upper Extremity Assessment:  (S/p left Rotator cuff repair 04/09/13) LUE: Unable to fully assess due to immobilization Cervical / Trunk Assessment Cervical / Trunk Assessment: Normal    Mobility Bed Mobility Bed Mobility: Supine to Sit;Sitting - Scoot to Edge of Bed;Sit to Supine Supine to Sit: 7: Independent Sitting - Scoot to Edge of Bed: 7: Independent Sit to Supine: 7: Independent Transfers Transfers: Sit to Stand;Stand to Sit Sit to Stand: 6: Modified independent (Device/Increase time);From bed;From chair/3-in-1;From toilet;Without upper extremity assist Stand to Sit: 6: Modified independent (Device/Increase time);To bed;To chair/3-in-1;To toilet;Without upper extremity assist Details for Transfer Assistance: Pt demonstrates good, safe technique.    Exercise Shoulder Exercises Pendulum Exercise: PROM;Left;10 reps;Standing Elbow Flexion: AROM;Left;10 reps;Seated Elbow Extension: AROM;Left;10 reps;Seated Wrist Flexion: AROM;Left;10 reps;Seated Wrist Extension: AROM;Left;10 reps;Seated Digit Composite Flexion: AROM;Left;10 reps;Seated Composite Extension: AROM;Left;10 reps;Seated Neck Lateral Flexion - Right: AROM;5 reps;Seated Neck Lateral Flexion - Left: AROM;5 reps;Seated Donning/doffing shirt without moving shoulder: Supervision/safety;Min-guard Method for sponge bathing under operated UE: Modified independent Donning/doffing sling/immobilizer: Supervision/safety;Min-guard Correct positioning of sling/immobilizer:  Modified independent Pendulum exercises (written home exercise program): Modified independent ROM for elbow, wrist and digits of operated UE: Modified independent Sling wearing schedule (on at all times/off for ADL's): Modified independent Positioning of UE while sleeping: Modified independent   Balance Balance Balance Assessed: Yes Static Sitting Balance Static Sitting - Balance Support: No upper extremity supported;Feet supported Static Standing Balance Static Standing - Balance Support: No upper extremity supported   End of Session OT - End of Session Equipment Utilized During Treatment: Other (comment) (left shoulder sling) Activity Tolerance: Patient tolerated treatment well Patient left: in chair;with call bell/phone within reach Nurse Communication: Mobility status  GO Functional Assessment Tool Used: Clinical judgement Functional Limitation: Self care Self Care Current Status (Z6109): At least 20 percent but less than 40 percent impaired, limited or restricted Self Care Goal Status (U0454): At least 1 percent but less than 20 percent impaired, limited or restricted Self Care Discharge Status [phone]): At least 20 percent but less than 40 percent impaired, limited or restricted   Alm Bustard 04/10/2013, 9:05 AM

## 2013-04-10 NOTE — Progress Notes (Signed)
Subjective: Pt stable - hand mobile - dressing ok   Objective: Vital signs in last 24 hours: Temp:  [97.4 F (36.3 C)-98.4 F (36.9 C)] 98.4 F (36.9 C) (12/10 0412) Pulse Rate:  [66-88] 79 (12/10 0412) Resp:  [10-17] 16 (12/10 0412) BP: (95-130)/(50-67) 118/64 mmHg (12/10 0412) SpO2:  [94 %-100 %] 98 % (12/10 0412)  Intake/Output from previous day: 12/09 0701 - 12/10 0700 In: 1990 [P.O.:440; I.V.:1550] Out: 75 [Blood:75] Intake/Output this shift:    Exam:  Intact pulses distally No cellulitis present Compartment soft  Labs: No results found for this basename: HGB,  in the last 72 hours No results found for this basename: WBC, RBC, HCT, PLT,  in the last 72 hours No results found for this basename: NA, K, CL, CO2, BUN, CREATININE, GLUCOSE, CALCIUM,  in the last 72 hours No results found for this basename: LABPT, INR,  in the last 72 hours  Assessment/Plan: Dc today after OT   Zelphia Glover SCOTT 04/10/2013, 8:20 AM

## 2013-04-10 NOTE — Progress Notes (Signed)
Patient discharged to home accompanied by family. Discharge instructions and rx given and explained and patient stated understanding. Patients IV was removed and she left unit in a stable condition via wheelchair. 

## 2013-04-10 NOTE — Anesthesia Postprocedure Evaluation (Signed)
Anesthesia Post Note  Patient: Lindsay Aguirre  Procedure(s) Performed: Procedure(s) (LRB): SHOULDER ARTHROSCOPY WITH SUBACROMIAL DECOMPRESSION AND MINI OPEN ROTATOR CUFF REPAIR, POSSIBLE BICEP TENDONODESIS,   (Left)  Anesthesia type: General  Patient location: PACU  Post pain: Pain level controlled  Post assessment: Patient's Cardiovascular Status Stable  Last Vitals:  Filed Vitals:   04/10/13 0412  BP: 118/64  Pulse: 79  Temp: 36.9 C  Resp: 16    Post vital signs: Reviewed and stable  Level of consciousness: alert  Complications: No apparent anesthesia complications

## 2013-04-11 ENCOUNTER — Encounter (HOSPITAL_COMMUNITY): Payer: Self-pay | Admitting: Orthopedic Surgery

## 2013-04-28 ENCOUNTER — Other Ambulatory Visit: Payer: Self-pay | Admitting: Nurse Practitioner

## 2013-04-30 ENCOUNTER — Ambulatory Visit: Payer: 59 | Attending: Orthopedic Surgery | Admitting: Physical Therapy

## 2013-04-30 DIAGNOSIS — R5381 Other malaise: Secondary | ICD-10-CM | POA: Insufficient documentation

## 2013-04-30 DIAGNOSIS — I10 Essential (primary) hypertension: Secondary | ICD-10-CM | POA: Insufficient documentation

## 2013-04-30 DIAGNOSIS — M25619 Stiffness of unspecified shoulder, not elsewhere classified: Secondary | ICD-10-CM | POA: Insufficient documentation

## 2013-04-30 DIAGNOSIS — M25519 Pain in unspecified shoulder: Secondary | ICD-10-CM | POA: Insufficient documentation

## 2013-04-30 DIAGNOSIS — IMO0001 Reserved for inherently not codable concepts without codable children: Secondary | ICD-10-CM | POA: Insufficient documentation

## 2013-04-30 NOTE — Discharge Summary (Signed)
Physician Discharge Summary  Patient ID: Lindsay Aguirre MRN: [id number] DOB/AGE: March 28, 1951 62 y.o.  Admit date: 04/09/2013 Discharge date: 04/10/2013  Admission Diagnoses:  Rotator cuff tear  Discharge Diagnoses:  Principal Problem:   Rotator cuff tear   Past Medical History  Diagnosis Date  . PONV (postoperative nausea and vomiting)   . Hypertension   . Hypothyroidism   . Arthritis   . Gout   . Neuromuscular disorder   . Hyperlipidemia   . Family history of anesthesia complication     '" MY OLDEST SON GETS REAL SICK LIKE ME "    Surgeries: Procedure(s): SHOULDER ARTHROSCOPY WITH SUBACROMIAL DECOMPRESSION AND MINI OPEN ROTATOR CUFF REPAIR, POSSIBLE BICEP TENDONODESIS,   on 04/09/2013   Consultants (if any):  NONE  Discharged Condition: Improved  Hospital Course: Lindsay Aguirre is an 62 y.o. female who was admitted 04/09/2013 with a diagnosis of Rotator cuff tear and went to the operating room on 04/09/2013 and underwent the above named procedures.    She was given perioperative antibiotics:      Anti-infectives   Start     Dose/Rate Route Frequency Ordered Stop   04/09/13 1800  ceFAZolin (ANCEF) IVPB 2 g/50 mL premix     2 g 100 mL/hr over 30 Minutes Intravenous 3 times per day 04/09/13 1424 04/10/13 0615   04/09/13 0600  ceFAZolin (ANCEF) IVPB 2 g/50 mL premix     2 g 100 mL/hr over 30 Minutes Intravenous On call to O.R. 04/08/13 1419 04/09/13 1030    .  She was given sequential compression devices, early ambulation for DVT prophylaxis.  She benefited maximally from the hospital stay and there were no complications.    Recent vital signs:  Filed Vitals:   04/10/13 0412  BP: 118/64  Pulse: 79  Temp: 98.4 F (36.9 C)  Resp: 16    Recent laboratory studies:  Lab Results  Component Value Date   HGB 12.3 04/03/2013   HGB 9.5* 04/01/2011   HGB 13.5 03/30/2011   Lab Results  Component Value Date   WBC 7.2 04/03/2013   PLT 280 04/03/2013   Lab  Results  Component Value Date   INR 1.0 03/10/2008   Lab Results  Component Value Date   NA 137 04/03/2013   K 3.8 04/03/2013   CL 99 04/03/2013   CO2 26 04/03/2013   BUN 17 04/03/2013   CREATININE 0.90 04/03/2013   GLUCOSE 96 04/03/2013    Discharge Medications:     Medication List    STOP taking these medications       ibuprofen 800 MG tablet  Commonly known as:  ADVIL,MOTRIN     traMADol 50 MG tablet  Commonly known as:  ULTRAM      TAKE these medications       allopurinol 300 MG tablet  Commonly known as:  ZYLOPRIM  Take 1 tablet (300 mg total) by mouth daily.     Biotin 5000 MCG Caps  Take 5,000 mcg by mouth daily.     Black Cohosh 40 MG Caps  Take 40 mg by mouth daily.     diclofenac 50 MG EC tablet  Commonly known as:  VOLTAREN  Take 50 mg by mouth 2 (two) times daily as needed for mild pain or moderate pain.     Fish Oil 1200 MG Caps  Take 1,200 mg by mouth daily.     lisinopril-hydrochlorothiazide 20-12.5 MG per tablet  Commonly known as:  PRINZIDE,ZESTORETIC  Take 1 tablet by mouth daily.     methocarbamol 500 MG tablet  Commonly known as:  ROBAXIN  Take 1 tablet (500 mg total) by mouth every 6 (six) hours as needed for muscle spasms.     oxyCODONE 5 MG immediate release tablet  Commonly known as:  Oxy IR/ROXICODONE  Take 1-2 tablets (5-10 mg total) by mouth every 3 (three) hours as needed for breakthrough pain.     rosuvastatin 10 MG tablet  Commonly known as:  CRESTOR  Take 1 tablet (10 mg total) by mouth daily.     SUPER B COMPLEX PO  Take 1 tablet by mouth daily.     SYNTHROID 88 MCG tablet  Generic drug:  levothyroxine  Take 1 tablet (88 mcg total) by mouth daily before breakfast.     vitamin C 500 MG tablet  Commonly known as:  ASCORBIC ACID  Take 500 mg by mouth daily.     Vitamin D3 2000 UNITS Tabs  Take 1 tablet by mouth daily.        Diagnostic Studies: Dg Chest 2 View  04/03/2013   CLINICAL DATA:  Preop for repair: Left  rotator cuff  EXAM: CHEST  2 VIEW  COMPARISON:  Chest x-ray of 03/30/2011  FINDINGS: No active infiltrate or effusion is seen. Mediastinal contours appear normal. The heart is within normal limits in size. No acute bony abnormality is seen. A lower anterior cervical spine fusion plate is present.  IMPRESSION: Stable chest x-ray.  No active lung disease.   Electronically Signed   By: Dwyane Dee M.D.   On: 04/03/2013 16:07    Disposition: 01-Home or Self Care  Discharge Orders   Future Appointments Provider Department Dept Phone   04/30/2013 11:15 AM [place] [address] [place], [facility] [phone]   05/08/2013 2:00 PM [name] Daphine Deutscher, FNP [place] [facility] [phone]   Future Orders Complete By Expires   Apply dressing  As directed    Comments:     mepilex to left shoulder before discharge   Call MD / Call 911  As directed    Comments:     If you experience chest pain or shortness of breath, CALL 911 and be transported to the hospital emergency room.  If you develope a fever above 101 F, pus (white drainage) or increased drainage or redness at the wound, or calf pain, call your surgeon's office.   Constipation Prevention  As directed    Comments:     Drink plenty of fluids.  Prune juice may be helpful.  You may use a stool softener, such as Colace (over the counter) 100 mg twice a day.  Use MiraLax (over the counter) for constipation as needed.   Diet - low sodium heart healthy  As directed    Discharge instructions  As directed    Comments:     CPM machine 1 hour 3 times a day Keep incision dry Stay in sling   Increase activity slowly as tolerated  As directed       Follow-up Information   Schedule an appointment as soon as possible for a visit with Cammy Copa, MD.   Specialty:  Orthopedic Surgery   Contact information:   [address] [place] [address] [phone]        Signed: Wende Neighbors 04/30/2013,  10:49 AM

## 2013-05-06 ENCOUNTER — Other Ambulatory Visit: Payer: Self-pay | Admitting: Nurse Practitioner

## 2013-05-06 ENCOUNTER — Ambulatory Visit: Payer: 59 | Attending: Orthopedic Surgery | Admitting: Physical Therapy

## 2013-05-06 DIAGNOSIS — M25619 Stiffness of unspecified shoulder, not elsewhere classified: Secondary | ICD-10-CM | POA: Insufficient documentation

## 2013-05-06 DIAGNOSIS — IMO0001 Reserved for inherently not codable concepts without codable children: Secondary | ICD-10-CM | POA: Insufficient documentation

## 2013-05-06 DIAGNOSIS — R5381 Other malaise: Secondary | ICD-10-CM | POA: Insufficient documentation

## 2013-05-06 DIAGNOSIS — M25519 Pain in unspecified shoulder: Secondary | ICD-10-CM | POA: Insufficient documentation

## 2013-05-08 ENCOUNTER — Ambulatory Visit (INDEPENDENT_AMBULATORY_CARE_PROVIDER_SITE_OTHER): Payer: 59 | Admitting: Nurse Practitioner

## 2013-05-08 ENCOUNTER — Encounter: Payer: Self-pay | Admitting: Nurse Practitioner

## 2013-05-08 VITALS — BP 122/73 | HR 77 | Temp 98.0°F | Ht 65.0 in | Wt 196.0 lb

## 2013-05-08 DIAGNOSIS — K5790 Diverticulosis of intestine, part unspecified, without perforation or abscess without bleeding: Secondary | ICD-10-CM

## 2013-05-08 DIAGNOSIS — I10 Essential (primary) hypertension: Secondary | ICD-10-CM

## 2013-05-08 DIAGNOSIS — R102 Pelvic and perineal pain: Secondary | ICD-10-CM

## 2013-05-08 DIAGNOSIS — R109 Unspecified abdominal pain: Secondary | ICD-10-CM

## 2013-05-08 DIAGNOSIS — E039 Hypothyroidism, unspecified: Secondary | ICD-10-CM

## 2013-05-08 DIAGNOSIS — K5730 Diverticulosis of large intestine without perforation or abscess without bleeding: Secondary | ICD-10-CM

## 2013-05-08 DIAGNOSIS — N390 Urinary tract infection, site not specified: Secondary | ICD-10-CM

## 2013-05-08 DIAGNOSIS — E785 Hyperlipidemia, unspecified: Secondary | ICD-10-CM

## 2013-05-08 DIAGNOSIS — M109 Gout, unspecified: Secondary | ICD-10-CM

## 2013-05-08 LAB — POCT UA - MICROSCOPIC ONLY
Casts, Ur, LPF, POC: NEGATIVE
Crystals, Ur, HPF, POC: NEGATIVE
Mucus, UA: NEGATIVE
Yeast, UA: NEGATIVE

## 2013-05-08 LAB — POCT URINALYSIS DIPSTICK
BILIRUBIN UA: NEGATIVE
GLUCOSE UA: NEGATIVE
KETONES UA: NEGATIVE
NITRITE UA: NEGATIVE
PH UA: 6
Protein, UA: NEGATIVE
SPEC GRAV UA: 1.02
Urobilinogen, UA: NEGATIVE

## 2013-05-08 MED ORDER — SYNTHROID 88 MCG PO TABS: 88.0000 ug | ORAL_TABLET | Freq: Every day | ORAL | Status: DC

## 2013-05-08 MED ORDER — CIPROFLOXACIN HCL 500 MG PO TABS: 500.0000 mg | ORAL_TABLET | Freq: Two times a day (BID) | ORAL | Status: DC

## 2013-05-08 MED ORDER — ROSUVASTATIN CALCIUM 10 MG PO TABS: 10.0000 mg | ORAL_TABLET | Freq: Every day | ORAL | Status: DC

## 2013-05-08 MED ORDER — LISINOPRIL-HYDROCHLOROTHIAZIDE 20-12.5 MG PO TABS: 1.0000 | ORAL_TABLET | Freq: Every day | ORAL | Status: DC

## 2013-05-08 MED ORDER — ALLOPURINOL 300 MG PO TABS: 300.0000 mg | ORAL_TABLET | Freq: Every day | ORAL | Status: DC

## 2013-05-08 NOTE — Patient Instructions (Signed)
Urinary Tract Infection  Urinary tract infections (UTIs) can develop anywhere along your urinary tract. Your urinary tract is your body's drainage system for removing wastes and extra water. Your urinary tract includes two kidneys, two ureters, a bladder, and a urethra. Your kidneys are a pair of bean-shaped organs. Each kidney is about the size of your fist. They are located below your ribs, one on each side of your spine.  CAUSES  Infections are caused by microbes, which are microscopic organisms, including fungi, viruses, and bacteria. These organisms are so small that they can only be seen through a microscope. Bacteria are the microbes that most commonly cause UTIs.  SYMPTOMS   Symptoms of UTIs may vary by age and gender of the patient and by the location of the infection. Symptoms in young women typically include a frequent and intense urge to urinate and a painful, burning feeling in the bladder or urethra during urination. Older women and men are more likely to be tired, shaky, and weak and have muscle aches and abdominal pain. A fever may mean the infection is in your kidneys. Other symptoms of a kidney infection include pain in your back or sides below the ribs, nausea, and vomiting.  DIAGNOSIS  To diagnose a UTI, your caregiver will ask you about your symptoms. Your caregiver also will ask to provide a urine sample. The urine sample will be tested for bacteria and white blood cells. White blood cells are made by your body to help fight infection.  TREATMENT   Typically, UTIs can be treated with medication. Because most UTIs are caused by a bacterial infection, they usually can be treated with the use of antibiotics. The choice of antibiotic and length of treatment depend on your symptoms and the type of bacteria causing your infection.  HOME CARE INSTRUCTIONS   If you were prescribed antibiotics, take them exactly as your caregiver instructs you. Finish the medication even if you feel better after you  have only taken some of the medication.   Drink enough water and fluids to keep your urine clear or pale yellow.   Avoid caffeine, tea, and carbonated beverages. They tend to irritate your bladder.   Empty your bladder often. Avoid holding urine for long periods of time.   Empty your bladder before and after sexual intercourse.   After a bowel movement, women should cleanse from front to back. Use each tissue only once.  SEEK MEDICAL CARE IF:    You have back pain.   You develop a fever.   Your symptoms do not begin to resolve within 3 days.  SEEK IMMEDIATE MEDICAL CARE IF:    You have severe back pain or lower abdominal pain.   You develop chills.   You have nausea or vomiting.   You have continued burning or discomfort with urination.  MAKE SURE YOU:    Understand these instructions.   Will watch your condition.   Will get help right away if you are not doing well or get worse.  Document Released: 01/26/2005 Document Revised: 10/18/2011 Document Reviewed: 05/27/2011  [employer] Patient Information 2014 [employer].

## 2013-05-08 NOTE — Progress Notes (Signed)
Subjective:    Patient ID: Lindsay Aguirre, female    DOB: 1951/03/03, 63 y.o.   MRN: [id number]  Patient here today for follow up of chronic medical problems- no changes since last visit.- C/O slight dysuria and frequency.  Hyperlipidemia This is a chronic problem. The current episode started more than 1 year ago. The problem is uncontrolled. Recent lipid tests were reviewed and are high. Exacerbating diseases include hypothyroidism and obesity. There are no known factors aggravating her hyperlipidemia. Pertinent negatives include no chest pain, focal sensory loss, leg pain, myalgias or shortness of breath. Current antihyperlipidemic treatment includes statins. Improvement on treatment: will have to see when labs are back. Compliance problems include adherence to diet and adherence to exercise.  Risk factors for coronary artery disease include hypertension, obesity and post-menopausal.  Hypertension This is a chronic problem. The current episode started more than 1 year ago. The problem has been resolved since onset. The problem is controlled. Pertinent negatives include no blurred vision, chest pain, headaches, malaise/fatigue, palpitations, peripheral edema, shortness of breath or sweats. There are no associated agents to hypertension. Risk factors for coronary artery disease include dyslipidemia, obesity and post-menopausal state. Past treatments include ACE inhibitors and diuretics. The current treatment provides significant improvement. Compliance problems include diet and exercise.  Hypertensive end-organ damage includes a thyroid problem.  Thyroid Problem Presents for follow-up (hypothyroidism) visit. Patient reports no anxiety, cold intolerance, constipation, fatigue, hoarse voice, leg swelling, menstrual problem, palpitations, visual change, weight gain or weight loss. The symptoms have been stable. Her past medical history is significant for hyperlipidemia.  GOUT Allopurinol- daily- Hasn't  had a flare up in .6 years.    Review of Systems  Constitutional: Negative for weight loss, weight gain, malaise/fatigue and fatigue.  HENT: Negative for hoarse voice.   Eyes: Negative for blurred vision.  Respiratory: Negative for shortness of breath.   Cardiovascular: Negative for chest pain and palpitations.  Gastrointestinal: Negative for constipation.  Endocrine: Negative for cold intolerance.  Genitourinary: Positive for dysuria and frequency. Negative for menstrual problem.  Musculoskeletal: Negative for myalgias.  Neurological: Negative for headaches.  All other systems reviewed and are negative.       Objective:   Physical Exam  Constitutional: She is oriented to person, place, and time. She appears well-developed and well-nourished.  HENT:  Nose: Nose normal.  Mouth/Throat: Oropharynx is clear and moist.  Eyes: EOM are normal.  Neck: Trachea normal, normal range of motion and full passive range of motion without pain. Neck supple. No JVD present. Carotid bruit is not present. No thyromegaly present.  Cardiovascular: Normal rate, regular rhythm, normal heart sounds and intact distal pulses.  Exam reveals no gallop and no friction rub.   No murmur heard. Pulmonary/Chest: Effort normal and breath sounds normal.  Abdominal: Soft. Bowel sounds are normal. She exhibits no distension and no mass. There is no tenderness.  Musculoskeletal: Normal range of motion.  Lymphadenopathy:    She has no cervical adenopathy.  Neurological: She is alert and oriented to person, place, and time. She has normal reflexes.  Skin: Skin is warm and dry.  Psychiatric: She has a normal mood and affect. Her behavior is normal. Judgment and thought content normal.     BP 122/73  Pulse 77  Temp(Src) 98 F (36.7 C) (Oral)  Ht 5\' 5"  (1.651 m)  Wt 196 lb (88.905 kg)  BMI 32.62 kg/m2      Assessment & Plan:   1. Suprapubic pressure  2. Hypothyroidism   3. Hypertension   4.  Hyperlipidemia   5. Gout   6. Diverticulosis   7. UTI (urinary tract infection)    Orders Placed This Encounter  Procedures  . POCT urinalysis dipstick  . POCT UA - Microscopic Only   Meds ordered this encounter  Medications  . ciprofloxacin (CIPRO) 500 MG tablet    Sig: Take 1 tablet (500 mg total) by mouth 2 (two) times daily.    Dispense:  14 tablet    Refill:  0    Order Specific Question:  Supervising Provider    Answer:  Chipper Herb [[id number]]  . allopurinol (ZYLOPRIM) 300 MG tablet    Sig: Take 1 tablet (300 mg total) by mouth daily.    Dispense:  30 tablet    Refill:  5    Order Specific Question:  Supervising Provider    Answer:  Chipper Herb [[id number]]  . lisinopril-hydrochlorothiazide (PRINZIDE,ZESTORETIC) 20-12.5 MG per tablet    Sig: Take 1 tablet by mouth daily.    Dispense:  30 tablet    Refill:  5    Order Specific Question:  Supervising Provider    Answer:  Chipper Herb [[id number]]  . rosuvastatin (CRESTOR) 10 MG tablet    Sig: Take 1 tablet (10 mg total) by mouth daily.    Dispense:  30 tablet    Refill:  5    Order Specific Question:  Supervising Provider    Answer:  Chipper Herb [[id number]]  . SYNTHROID 88 MCG tablet    Sig: Take 1 tablet (88 mcg total) by mouth daily before breakfast.    Dispense:  30 tablet    Refill:  5    Order Specific Question:  Supervising Provider    Answer:  Joycelyn Man    Continue all meds Labs pending Diet and exercise encouraged Health maintenance reviewed Follow up in 3 months  [place], FNP

## 2013-05-09 ENCOUNTER — Ambulatory Visit: Payer: 59 | Admitting: Physical Therapy

## 2013-05-10 LAB — CMP14+EGFR
ALBUMIN: 5 g/dL — AB (ref 3.6–4.8)
ALT: 19 IU/L (ref 0–32)
AST: 21 IU/L (ref 0–40)
Albumin/Globulin Ratio: 2.8 — ABNORMAL HIGH (ref 1.1–2.5)
Alkaline Phosphatase: 62 IU/L (ref 39–117)
BILIRUBIN TOTAL: 0.4 mg/dL (ref 0.0–1.2)
BUN/Creatinine Ratio: 20 (ref 11–26)
BUN: 18 mg/dL (ref 8–27)
CO2: 28 mmol/L (ref 18–29)
Calcium: 10.6 mg/dL — ABNORMAL HIGH (ref 8.6–10.2)
Chloride: 95 mmol/L — ABNORMAL LOW (ref 97–108)
Creatinine, Ser: 0.91 mg/dL (ref 0.57–1.00)
GFR calc non Af Amer: 67 mL/min/{1.73_m2} (ref >59)
GFR, EST AFRICAN AMERICAN: 77 mL/min/{1.73_m2} (ref >59)
GLOBULIN, TOTAL: 1.8 g/dL (ref 1.5–4.5)
Glucose: 87 mg/dL (ref 65–99)
Potassium: 4.5 mmol/L (ref 3.5–5.2)
SODIUM: 140 mmol/L (ref 134–144)
Total Protein: 6.8 g/dL (ref 6.0–8.5)

## 2013-05-10 LAB — NMR, LIPOPROFILE
CHOLESTEROL: 153 mg/dL (ref <200)
HDL Cholesterol by NMR: 47 mg/dL (ref >=40)
HDL PARTICLE NUMBER: 28.2 umol/L — AB (ref >=30.5)
LDL PARTICLE NUMBER: 984 nmol/L (ref <1000)
LDL Size: 20.3 nm — ABNORMAL LOW (ref >20.5)
LDLC SERPL CALC-MCNC: 65 mg/dL (ref <100)
LP-IR SCORE: 47 — AB (ref <=45)
Small LDL Particle Number: 517 nmol/L (ref <=527)
Triglycerides by NMR: 206 mg/dL — ABNORMAL HIGH (ref <150)

## 2013-05-14 ENCOUNTER — Ambulatory Visit: Payer: 59 | Admitting: *Deleted

## 2013-05-15 ENCOUNTER — Ambulatory Visit: Payer: 59 | Admitting: Physical Therapy

## 2013-05-16 ENCOUNTER — Encounter: Payer: 59 | Admitting: Physical Therapy

## 2013-05-21 ENCOUNTER — Ambulatory Visit: Payer: 59 | Admitting: Physical Therapy

## 2013-05-23 ENCOUNTER — Ambulatory Visit: Payer: 59 | Admitting: Physical Therapy

## 2013-05-28 ENCOUNTER — Ambulatory Visit: Payer: 59 | Admitting: Physical Therapy

## 2013-05-30 ENCOUNTER — Ambulatory Visit: Payer: 59 | Admitting: Physical Therapy

## 2013-06-03 ENCOUNTER — Ambulatory Visit: Payer: 59 | Attending: Orthopedic Surgery | Admitting: Physical Therapy

## 2013-06-03 DIAGNOSIS — IMO0001 Reserved for inherently not codable concepts without codable children: Secondary | ICD-10-CM | POA: Insufficient documentation

## 2013-06-03 DIAGNOSIS — M25519 Pain in unspecified shoulder: Secondary | ICD-10-CM | POA: Insufficient documentation

## 2013-06-03 DIAGNOSIS — R5381 Other malaise: Secondary | ICD-10-CM | POA: Insufficient documentation

## 2013-06-03 DIAGNOSIS — M25619 Stiffness of unspecified shoulder, not elsewhere classified: Secondary | ICD-10-CM | POA: Insufficient documentation

## 2013-06-05 ENCOUNTER — Encounter: Payer: Self-pay | Admitting: Nurse Practitioner

## 2013-06-05 ENCOUNTER — Ambulatory Visit (INDEPENDENT_AMBULATORY_CARE_PROVIDER_SITE_OTHER): Payer: 59

## 2013-06-05 ENCOUNTER — Ambulatory Visit (INDEPENDENT_AMBULATORY_CARE_PROVIDER_SITE_OTHER): Payer: 59 | Admitting: Nurse Practitioner

## 2013-06-05 VITALS — BP 107/68 | HR 78 | Temp 97.6°F | Ht 65.0 in | Wt 195.0 lb

## 2013-06-05 DIAGNOSIS — R109 Unspecified abdominal pain: Secondary | ICD-10-CM

## 2013-06-05 NOTE — Patient Instructions (Signed)

## 2013-06-05 NOTE — Progress Notes (Signed)
   Subjective:    Patient ID: Lindsay Aguirre, female    DOB: 1951/04/26, 63 y.o.   MRN: [id number]  HPI  Patient in c/o of left flank pain- started in November- stays sore all the time- rates pain a  2/10- pain is worst when laying on it. Nothing seems to make it worse or better.    Review of Systems  Constitutional: Negative.   HENT: Negative.   Respiratory: Negative.   Cardiovascular: Negative.   Gastrointestinal: Negative.   Genitourinary: Negative.   All other systems reviewed and are negative.       Objective:   Physical Exam  Constitutional: She appears well-developed and well-nourished.  Cardiovascular: Normal rate, regular rhythm and normal heart sounds.   Pulmonary/Chest: Effort normal and breath sounds normal.  Abdominal: Soft. Bowel sounds are normal. She exhibits no distension. There is no tenderness.  Pain on palpation left upper flank    BP 107/68  Pulse 78  Temp(Src) 97.6 F (36.4 C) (Oral)  Ht 5\' 5"  (1.651 m)  Wt 195 lb (88.451 kg)  BMI 32.45 kg/m2 KUB- GAs in left splenic curvature        Assessment & Plan:   1. Left flank pain    Increase fiber in diet STool softners daily Follow up prn  .mmms

## 2013-06-10 ENCOUNTER — Ambulatory Visit: Payer: 59 | Admitting: Physical Therapy

## 2013-06-13 ENCOUNTER — Ambulatory Visit: Payer: 59 | Admitting: *Deleted

## 2013-06-13 ENCOUNTER — Other Ambulatory Visit (HOSPITAL_COMMUNITY): Payer: 59

## 2013-06-17 ENCOUNTER — Encounter: Payer: 59 | Admitting: *Deleted

## 2013-06-17 ENCOUNTER — Ambulatory Visit (INDEPENDENT_AMBULATORY_CARE_PROVIDER_SITE_OTHER): Payer: 59 | Admitting: Family Medicine

## 2013-06-17 ENCOUNTER — Ambulatory Visit: Payer: 59 | Admitting: Family Medicine

## 2013-06-17 ENCOUNTER — Encounter: Payer: Self-pay | Admitting: Family Medicine

## 2013-06-17 VITALS — BP 106/71 | HR 60 | Temp 98.8°F | Ht 66.0 in | Wt 193.2 lb

## 2013-06-17 DIAGNOSIS — R05 Cough: Secondary | ICD-10-CM

## 2013-06-17 DIAGNOSIS — R059 Cough, unspecified: Secondary | ICD-10-CM

## 2013-06-17 DIAGNOSIS — J111 Influenza due to unidentified influenza virus with other respiratory manifestations: Secondary | ICD-10-CM

## 2013-06-17 LAB — POCT INFLUENZA A/B
Influenza A, POC: NEGATIVE
Influenza B, POC: NEGATIVE

## 2013-06-17 MED ORDER — OSELTAMIVIR PHOSPHATE 75 MG PO CAPS: 75.0000 mg | ORAL_CAPSULE | Freq: Two times a day (BID) | ORAL | Status: DC

## 2013-06-17 NOTE — Progress Notes (Signed)
Patient ID: CYANN ALLINDER, female   DOB: Jan 22, 1951, 63 y.o.   MRN: [id number] SUBJECTIVE: CC: Chief Complaint  Patient presents with  . Acute Visit    flu    HPI: Thinks  She has the flu. Fever , chills , aches, nasal congestion. occasional cough. No SOB. No chest pain.no leg edema. No palpitations.  Past Medical History  Diagnosis Date  . PONV (postoperative nausea and vomiting)   . Hypertension   . Hypothyroidism   . Arthritis   . Gout   . Neuromuscular disorder   . Hyperlipidemia   . Family history of anesthesia complication     '" MY OLDEST SON GETS REAL SICK LIKE ME "   Past Surgical History  Procedure Laterality Date  . Cervical fusion  '99 & '03  . Rotator cuff surgery  '08    right  . Cataract extraction w/phaco  12/13/2010    Procedure: CATARACT EXTRACTION PHACO AND INTRAOCULAR LENS PLACEMENT (IOC);  Surgeon: Gemma Payor;  Location: [facility];  Service: Ophthalmology;  Laterality: Right;  CDE: 8.94  . Eye surgery    . Back surgery      2009  . Back surgery  2011    lumbar surgery  . Tubal ligation  1979  . Rotator cuff repair Left 04/09/2013    DR August Saucer  . Shoulder arthroscopy with subacromial decompression and open rotator c Left 04/09/2013    Procedure: SHOULDER ARTHROSCOPY WITH SUBACROMIAL DECOMPRESSION AND MINI OPEN ROTATOR CUFF REPAIR, POSSIBLE BICEP TENDONODESIS,  ;  Surgeon: Cammy Copa, MD;  Location: [facility] OR;  Service: Orthopedics;  Laterality: Left;   History   Social History  . Marital Status: Divorced    Spouse Name: N/A    Number of Children: N/A  . Years of Education: N/A   Occupational History  . Not on file.   Social History Main Topics  . Smoking status: Never Smoker   . Smokeless tobacco: Never Used  . Alcohol Use: No  . Drug Use: No  . Sexual Activity: Not on file   Other Topics Concern  . Not on file   Social History Narrative  . No narrative on file   Family History  Problem Relation Age of Onset  . Anesthesia  problems Neg Hx   . Hypotension Neg Hx   . Malignant hyperthermia Neg Hx   . Pseudochol deficiency Neg Hx   . Diabetes Sister   . Stroke Mother   . Heart disease Father   . Stroke Father   . Diabetes Brother   . Diabetes Sister   . Diabetes Sister   . Diabetes Sister    Current Outpatient Prescriptions on File Prior to Visit  Medication Sig Dispense Refill  . allopurinol (ZYLOPRIM) 300 MG tablet Take 1 tablet (300 mg total) by mouth daily.  30 tablet  5  . B Complex-C (SUPER B COMPLEX PO) Take 1 tablet by mouth daily.      . Biotin 5000 MCG CAPS Take 5,000 mcg by mouth daily.      . Black Cohosh 40 MG CAPS Take 40 mg by mouth daily.      . Cholecalciferol (VITAMIN D3) 2000 UNITS TABS Take 1 tablet by mouth daily.        . diclofenac (VOLTAREN) 50 MG EC tablet Take 50 mg by mouth 2 (two) times daily as needed for mild pain or moderate pain.      [name] lisinopril-hydrochlorothiazide (PRINZIDE,ZESTORETIC) 20-12.5 MG per tablet Take  1 tablet by mouth daily.  30 tablet  5  . Omega-3 Fatty Acids (FISH OIL) 1200 MG CAPS Take 1,200 mg by mouth daily.      . rosuvastatin (CRESTOR) 10 MG tablet Take 1 tablet (10 mg total) by mouth daily.  30 tablet  5  . SYNTHROID 88 MCG tablet Take 1 tablet (88 mcg total) by mouth daily before breakfast.  30 tablet  5  . vitamin C (ASCORBIC ACID) 500 MG tablet Take 500 mg by mouth daily.       No current facility-administered medications on file prior to visit.   Allergies  Allergen Reactions  . Livalo [Pitavastatin]   . Simvastatin    Immunization History  Administered Date(s) Administered  . Influenza Whole 01/30/2012  . Influenza,inj,Quad PF,36+ Mos 01/30/2013  . Tdap 04/19/2010   Prior to Admission medications   Medication Sig Start Date End Date Taking? Authorizing Provider  allopurinol (ZYLOPRIM) 300 MG tablet Take 1 tablet (300 mg total) by mouth daily. 05/08/13  Yes [name] Daphine Deutscher, FNP  B Complex-C (SUPER B COMPLEX PO) Take 1 tablet by mouth  daily.   Yes Historical Provider, MD  Biotin 5000 MCG CAPS Take 5,000 mcg by mouth daily.   Yes Historical Provider, MD  Black Cohosh 40 MG CAPS Take 40 mg by mouth daily.   Yes Historical Provider, MD  Cholecalciferol (VITAMIN D3) 2000 UNITS TABS Take 1 tablet by mouth daily.     Yes Historical Provider, MD  diclofenac (VOLTAREN) 50 MG EC tablet Take 50 mg by mouth 2 (two) times daily as needed for mild pain or moderate pain.   Yes Historical Provider, MD  lisinopril-hydrochlorothiazide (PRINZIDE,ZESTORETIC) 20-12.5 MG per tablet Take 1 tablet by mouth daily. 05/08/13  Yes [name] Daphine Deutscher, FNP  Omega-3 Fatty Acids (FISH OIL) 1200 MG CAPS Take 1,200 mg by mouth daily.   Yes Historical Provider, MD  rosuvastatin (CRESTOR) 10 MG tablet Take 1 tablet (10 mg total) by mouth daily. 05/08/13  Yes [name] Daphine Deutscher, FNP  SYNTHROID 88 MCG tablet Take 1 tablet (88 mcg total) by mouth daily before breakfast. 05/08/13  Yes [name] Daphine Deutscher, FNP  vitamin C (ASCORBIC ACID) 500 MG tablet Take 500 mg by mouth daily.   Yes Historical Provider, MD  oseltamivir (TAMIFLU) 75 MG capsule Take 1 capsule (75 mg total) by mouth 2 (two) times daily. 06/17/13   Ileana Ladd, MD     ROS: As above in the HPI. All other systems are stable or negative.  OBJECTIVE: APPEARANCE:  Patient in no acute distress.The patient appeared well nourished and normally developed. Acyanotic. Waist: VITAL SIGNS:BP 106/71  Pulse 60  Temp(Src) 98.8 F (37.1 C) (Oral)  Ht 5\' 6"  (1.676 m)  Wt 193 lb 3.2 oz (87.635 kg)  BMI 31.20 kg/m2  WF congested.  SKIN: warm and  Dry without overt rashes, tattoos and scars  HEAD and Neck: without JVD, Head and scalp: normal Eyes:No scleral icterus. Fundi normal, eye movements normal. Ears: Auricle normal, canal normal, Tympanic membranes normal, insufflation normal. Nose: nasal congestion. Rhinorrhea. Throat: normal Neck & thyroid: normal  CHEST & LUNGS: Chest wall: normal Lungs:  Clear  CVS: Reveals the PMI to be normally located. Regular rhythm, First and Second Heart sounds are normal,  absence of murmurs, rubs or gallops. Peripheral vasculature: Radial pulses: normal Dorsal pedis pulses: normal Posterior pulses: normal  ABDOMEN:  Appearance: normal Benign, no organomegaly, no masses, no Abdominal Aortic enlargement. No Guarding , no rebound. No Bruits.  Bowel sounds: normal  RECTAL: N/A GU: N/A  EXTREMETIES: nonedematous.  MUSCULOSKELETAL:  Myalgias.  NEUROLOGIC: oriented to time,place and person; nonfocal.   ASSESSMENT:  Cough - Plan: Influenza A/B, oseltamivir (TAMIFLU) 75 MG capsule  Influenza with other respiratory manifestations - Plan: oseltamivir (TAMIFLU) 75 MG capsule Patient clinically appears to have influenza. Will ttreat as such.  PLAN: Handout on the flu given in the AVS.   Orders Placed This Encounter  Procedures  . Influenza A/B   Results for orders placed in visit on 06/17/13  POCT INFLUENZA A/B      Result Value Ref Range   Influenza A, POC Negative     Influenza B, POC Negative      Meds ordered this encounter  Medications  . oseltamivir (TAMIFLU) 75 MG capsule    Sig: Take 1 capsule (75 mg total) by mouth 2 (two) times daily.    Dispense:  10 capsule    Refill:  0   There are no discontinued medications. Return if symptoms worsen or fail to improve.  Shariece Viveiros P. Modesto Charon, M.D.

## 2013-06-17 NOTE — Patient Instructions (Signed)

## 2013-06-19 ENCOUNTER — Ambulatory Visit (HOSPITAL_COMMUNITY): Payer: 59

## 2013-06-20 ENCOUNTER — Encounter: Payer: 59 | Admitting: *Deleted

## 2013-06-26 ENCOUNTER — Other Ambulatory Visit: Payer: Self-pay | Admitting: Nurse Practitioner

## 2013-06-26 ENCOUNTER — Ambulatory Visit (HOSPITAL_COMMUNITY)
Admission: RE | Admit: 2013-06-26 | Discharge: 2013-06-26 | Disposition: A | Discharge status: Home / Self Care | Payer: 59 | Source: Ambulatory Visit | Attending: Nurse Practitioner | Admitting: Nurse Practitioner

## 2013-06-26 DIAGNOSIS — K7689 Other specified diseases of liver: Secondary | ICD-10-CM | POA: Insufficient documentation

## 2013-06-26 DIAGNOSIS — R1032 Left lower quadrant pain: Secondary | ICD-10-CM | POA: Insufficient documentation

## 2013-06-26 DIAGNOSIS — K769 Liver disease, unspecified: Secondary | ICD-10-CM

## 2013-06-26 DIAGNOSIS — R109 Unspecified abdominal pain: Secondary | ICD-10-CM

## 2013-06-27 ENCOUNTER — Encounter: Payer: 59 | Admitting: Physical Therapy

## 2013-06-28 ENCOUNTER — Ambulatory Visit (INDEPENDENT_AMBULATORY_CARE_PROVIDER_SITE_OTHER): Payer: 59 | Admitting: Family Medicine

## 2013-06-28 ENCOUNTER — Encounter: Payer: Self-pay | Admitting: Family Medicine

## 2013-06-28 VITALS — BP 109/66 | HR 65 | Temp 97.6°F | Ht 65.0 in | Wt 194.0 lb

## 2013-06-28 DIAGNOSIS — H6123 Impacted cerumen, bilateral: Secondary | ICD-10-CM

## 2013-06-28 DIAGNOSIS — J329 Chronic sinusitis, unspecified: Secondary | ICD-10-CM

## 2013-06-28 DIAGNOSIS — H6120 Impacted cerumen, unspecified ear: Secondary | ICD-10-CM

## 2013-06-28 MED ORDER — FLUCONAZOLE 150 MG PO TABS: 150.0000 mg | ORAL_TABLET | Freq: Every day | ORAL | Status: DC

## 2013-06-28 MED ORDER — AMOXICILLIN 875 MG PO TABS: 875.0000 mg | ORAL_TABLET | Freq: Two times a day (BID) | ORAL | Status: DC

## 2013-06-28 NOTE — Progress Notes (Signed)
   Subjective:    Patient ID: LEKEISHA ARENAS, female    DOB: 1950/07/16, 63 y.o.   MRN: [id number]  HPI  This 63 y.o. female presents for evaluation of sinus pressure, headaches, and left ear discomfort for the last few days.  She was seen for flu last week.  Review of Systems    No chest pain, SOB, HA, dizziness, vision change, N/V, diarrhea, constipation, dysuria, urinary urgency or frequency, myalgias, arthralgias or rash.  Objective:   Physical Exam Vital signs noted  Well developed well nourished female.  HEENT - Head atraumatic Normocephalic                Eyes - PERRLA, Conjuctiva - clear Sclera- Clear EOMI                Ears - EAC's with cerumen impactions bilateral                Throat - oropharanx wnl Respiratory - Lungs CTA bilateral Cardiac - RRR S1 and S2 without murmur GI - Abdomen soft Nontender and bowel sounds active x 4 Extremities - No edema. Neuro - Grossly intact.  After Irrigation of ears the EAC's are clear and TM's wnl      Assessment & Plan:  Sinusitis - Plan: amoxicillin (AMOXIL) 875 MG tablet po bid x 14 days #28 Push po fluids, rest, tylenol and motrin otc prn as directed for fever, arthralgias, and myalgias.  Follow up prn if sx's continue or persist.  Excessive cerumen in both ear  - Bilateral ear irrigations  Lysbeth Penner FNP

## 2013-07-01 ENCOUNTER — Ambulatory Visit: Payer: 59 | Attending: Orthopedic Surgery | Admitting: Physical Therapy

## 2013-07-01 DIAGNOSIS — M25619 Stiffness of unspecified shoulder, not elsewhere classified: Secondary | ICD-10-CM | POA: Insufficient documentation

## 2013-07-01 DIAGNOSIS — IMO0001 Reserved for inherently not codable concepts without codable children: Secondary | ICD-10-CM | POA: Insufficient documentation

## 2013-07-01 DIAGNOSIS — C750 Malignant neoplasm of parathyroid gland: Secondary | ICD-10-CM | POA: Insufficient documentation

## 2013-07-01 DIAGNOSIS — R5381 Other malaise: Secondary | ICD-10-CM | POA: Insufficient documentation

## 2013-07-02 ENCOUNTER — Other Ambulatory Visit: Payer: Self-pay | Admitting: Family Medicine

## 2013-07-02 ENCOUNTER — Telehealth: Payer: Self-pay | Admitting: Family Medicine

## 2013-07-02 NOTE — Telephone Encounter (Signed)
NTBS.

## 2013-07-03 ENCOUNTER — Encounter: Payer: Self-pay | Admitting: Family Medicine

## 2013-07-03 ENCOUNTER — Other Ambulatory Visit: Payer: Self-pay | Admitting: Nurse Practitioner

## 2013-07-03 ENCOUNTER — Ambulatory Visit (INDEPENDENT_AMBULATORY_CARE_PROVIDER_SITE_OTHER): Payer: 59 | Admitting: Family Medicine

## 2013-07-03 VITALS — BP 116/74 | HR 91 | Temp 98.0°F | Ht 65.0 in | Wt 192.8 lb

## 2013-07-03 DIAGNOSIS — J209 Acute bronchitis, unspecified: Secondary | ICD-10-CM

## 2013-07-03 DIAGNOSIS — K769 Liver disease, unspecified: Secondary | ICD-10-CM

## 2013-07-03 MED ORDER — HYDROCODONE-HOMATROPINE 5-1.5 MG/5ML PO SYRP: 5.0000 mL | ORAL_SOLUTION | Freq: Three times a day (TID) | ORAL | Status: DC | PRN

## 2013-07-03 MED ORDER — METHYLPREDNISOLONE (PAK) 4 MG PO TABS: ORAL_TABLET | ORAL | Status: DC

## 2013-07-03 NOTE — Patient Instructions (Signed)

## 2013-07-03 NOTE — Progress Notes (Signed)
   Subjective:    Patient ID: Lindsay Aguirre, female    DOB: 06-Sep-1950, 63 y.o.   MRN: [id number]  HPI  This 63 y.o. female presents for evaluation of cough, congestion, and wheezing at night. She was seen a week ago and tx for sinusitis and now it has moved into her lungs and She is coughing a lot and is wheezing.  Review of Systems C/o cough and wheeze   No chest pain, SOB, HA, dizziness, vision change, N/V, diarrhea, constipation, dysuria, urinary urgency or frequency, myalgias, arthralgias or rash.  Objective:   Physical Exam Vital signs noted  Well developed well nourished female.  HEENT - Head atraumatic Normocephalic                Eyes - PERRLA, Conjuctiva - clear Sclera- Clear EOMI                Ears - EAC's Wnl TM's Wnl Gross Hearing WNL                Throat - oropharanx wnl Respiratory - Lungs CTA bilateral Cardiac - RRR S1 and S2 without murmur GI - Abdomen soft Nontender and bowel sounds active x 4 Extremities - No edema. Neuro - Grossly intact.       Assessment & Plan:  Acute bronchitis - Plan: HYDROcodone-homatropine (HYCODAN) 5-1.5 MG/5ML syrup, methylPREDNIsolone (MEDROL DOSPACK) 4 MG tablet Push po fluids, rest, tylenol and motrin otc prn as directed for fever, arthralgias, and myalgias.  Follow up prn if sx's continue or persist.  Lysbeth Penner FNP

## 2013-07-04 ENCOUNTER — Other Ambulatory Visit (INDEPENDENT_AMBULATORY_CARE_PROVIDER_SITE_OTHER): Payer: 59

## 2013-07-04 ENCOUNTER — Ambulatory Visit: Payer: 59 | Admitting: Physical Therapy

## 2013-07-04 DIAGNOSIS — K769 Liver disease, unspecified: Secondary | ICD-10-CM

## 2013-07-04 DIAGNOSIS — K7689 Other specified diseases of liver: Secondary | ICD-10-CM

## 2013-07-04 NOTE — Telephone Encounter (Signed)
Patient has came in 3/4 to see bill

## 2013-07-05 LAB — BMP8+EGFR
BUN / CREAT RATIO: 20 (ref 11–26)
BUN: 17 mg/dL (ref 8–27)
CHLORIDE: 93 mmol/L — AB (ref 97–108)
CO2: 22 mmol/L (ref 18–29)
Calcium: 9.4 mg/dL (ref 8.7–10.3)
Creatinine, Ser: 0.84 mg/dL (ref 0.57–1.00)
GFR calc non Af Amer: 74 mL/min/{1.73_m2} (ref >59)
GFR, EST AFRICAN AMERICAN: 85 mL/min/{1.73_m2} (ref >59)
Glucose: 134 mg/dL — ABNORMAL HIGH (ref 65–99)
Potassium: 4 mmol/L (ref 3.5–5.2)
Sodium: 135 mmol/L (ref 134–144)

## 2013-07-09 ENCOUNTER — Ambulatory Visit: Payer: 59 | Admitting: *Deleted

## 2013-07-10 ENCOUNTER — Encounter (HOSPITAL_COMMUNITY): Payer: Self-pay

## 2013-07-10 ENCOUNTER — Ambulatory Visit (INDEPENDENT_AMBULATORY_CARE_PROVIDER_SITE_OTHER): Payer: 59 | Admitting: Nurse Practitioner

## 2013-07-10 ENCOUNTER — Ambulatory Visit (HOSPITAL_COMMUNITY)
Admission: RE | Admit: 2013-07-10 | Discharge: 2013-07-10 | Disposition: A | Discharge status: Home / Self Care | Payer: 59 | Source: Ambulatory Visit | Attending: Nurse Practitioner | Admitting: Nurse Practitioner

## 2013-07-10 ENCOUNTER — Encounter: Payer: Self-pay | Admitting: Nurse Practitioner

## 2013-07-10 VITALS — BP 126/75 | HR 80 | Temp 97.7°F | Ht 65.0 in | Wt 193.0 lb

## 2013-07-10 DIAGNOSIS — K769 Liver disease, unspecified: Secondary | ICD-10-CM | POA: Insufficient documentation

## 2013-07-10 DIAGNOSIS — R350 Frequency of micturition: Secondary | ICD-10-CM

## 2013-07-10 DIAGNOSIS — R160 Hepatomegaly, not elsewhere classified: Secondary | ICD-10-CM

## 2013-07-10 DIAGNOSIS — K7689 Other specified diseases of liver: Secondary | ICD-10-CM | POA: Insufficient documentation

## 2013-07-10 DIAGNOSIS — R300 Dysuria: Secondary | ICD-10-CM

## 2013-07-10 LAB — POCT URINALYSIS DIPSTICK
Bilirubin, UA: NEGATIVE
Blood, UA: NEGATIVE
Glucose, UA: NEGATIVE
Ketones, UA: NEGATIVE
LEUKOCYTES UA: NEGATIVE
NITRITE UA: NEGATIVE
PH UA: 7
PROTEIN UA: NEGATIVE
Spec Grav, UA: 1.01
UROBILINOGEN UA: NEGATIVE

## 2013-07-10 LAB — POCT UA - MICROSCOPIC ONLY
Bacteria, U Microscopic: NEGATIVE
CASTS, UR, LPF, POC: NEGATIVE
CRYSTALS, UR, HPF, POC: NEGATIVE
Mucus, UA: NEGATIVE
RBC, urine, microscopic: NEGATIVE
WBC, Ur, HPF, POC: NEGATIVE
Yeast, UA: NEGATIVE

## 2013-07-10 MED ORDER — GADOBENATE DIMEGLUMINE 529 MG/ML IV SOLN
18.0000 mL | Freq: Once | INTRAVENOUS | Status: AC | PRN
  Administered 2013-07-10: 18 mL via INTRAVENOUS

## 2013-07-10 MED ORDER — CIPROFLOXACIN HCL 500 MG PO TABS: 500.0000 mg | ORAL_TABLET | Freq: Two times a day (BID) | ORAL | Status: DC

## 2013-07-10 NOTE — Progress Notes (Signed)
   Subjective:    Patient ID: Lindsay Aguirre, female    DOB: [birth year], 63 y.o.   MRN: [id number]  HPI Patient in c/o dysuria and urgency that started several days ago  * Had an MRI- which showed multiple liver masses- needs liver bx performed.    Review of Systems  Constitutional: Negative.   HENT: Negative.   Eyes: Negative.   Respiratory: Negative.   Cardiovascular: Negative.   Gastrointestinal: Negative.   Genitourinary: Positive for dysuria, urgency, frequency and flank pain.  Musculoskeletal: Negative.   Hematological: Negative.   Psychiatric/Behavioral: Negative.        Objective:   Physical Exam  Constitutional: She is oriented to person, place, and time.  Abdominal: Soft. Bowel sounds are normal. There is tenderness (mild suprapubic tenderness).  Genitourinary:  No CVA tenderness  Neurological: She is alert and oriented to person, place, and time.  Skin: Skin is warm.  Psychiatric: She has a normal mood and affect. Her behavior is normal. Judgment and thought content normal.   BP 126/75  Pulse 80  Temp(Src) 97.7 F (36.5 C) (Oral)  Ht 5\' 5"  (1.651 m)  Wt 193 lb (87.544 kg)  BMI 32.12 kg/m2 Results for orders placed in visit on 07/10/13  POCT UA - MICROSCOPIC ONLY      Result Value Ref Range   WBC, Ur, HPF, POC neg     RBC, urine, microscopic neg     Bacteria, U Microscopic neg     Mucus, UA neg     Epithelial cells, urine per micros occ     Crystals, Ur, HPF, POC neg     Casts, Ur, LPF, POC neg     Yeast, UA neg    POCT URINALYSIS DIPSTICK      Result Value Ref Range   Color, UA yellow     Clarity, UA clear     Glucose, UA neg     Bilirubin, UA neg     Ketones, UA neg     Spec Grav, UA 1.010     Blood, UA neg     pH, UA 7.0     Protein, UA neg     Urobilinogen, UA negative     Nitrite, UA neg     Leukocytes, UA Negative            Assessment & Plan:  1. Dysuria Force fluids AZO over the counter X2 days RTO prn Culture  pending - POCT UA - Microscopic Only - POCT urinalysis dipstick - ciprofloxacin (CIPRO) 500 MG tablet; Take 1 tablet (500 mg total) by mouth 2 (two) times daily.  Dispense: 20 tablet; Refill: 0  2. Liver mass Wait on all reports to come back - Ambulatory referral to General Surgery - Lipase - Amylase - Hepatic function panel - Hepatitis panel, acute  [name] Hassell Done, FNP

## 2013-07-10 NOTE — Patient Instructions (Signed)

## 2013-07-11 ENCOUNTER — Encounter (HOSPITAL_COMMUNITY): Payer: Self-pay | Admitting: Emergency Medicine

## 2013-07-11 ENCOUNTER — Emergency Department (HOSPITAL_COMMUNITY)
Admission: EM | Admit: 2013-07-11 | Discharge: 2013-07-11 | Disposition: A | Discharge status: Home / Self Care | Payer: 59 | Attending: Emergency Medicine | Admitting: Emergency Medicine

## 2013-07-11 ENCOUNTER — Encounter: Payer: 59 | Admitting: Physical Therapy

## 2013-07-11 ENCOUNTER — Emergency Department (HOSPITAL_COMMUNITY): Payer: 59

## 2013-07-11 DIAGNOSIS — R102 Pelvic and perineal pain: Secondary | ICD-10-CM

## 2013-07-11 DIAGNOSIS — E785 Hyperlipidemia, unspecified: Secondary | ICD-10-CM | POA: Insufficient documentation

## 2013-07-11 DIAGNOSIS — Z79899 Other long term (current) drug therapy: Secondary | ICD-10-CM | POA: Insufficient documentation

## 2013-07-11 DIAGNOSIS — E039 Hypothyroidism, unspecified: Secondary | ICD-10-CM | POA: Insufficient documentation

## 2013-07-11 DIAGNOSIS — Z8669 Personal history of other diseases of the nervous system and sense organs: Secondary | ICD-10-CM | POA: Insufficient documentation

## 2013-07-11 DIAGNOSIS — I10 Essential (primary) hypertension: Secondary | ICD-10-CM | POA: Insufficient documentation

## 2013-07-11 DIAGNOSIS — E860 Dehydration: Secondary | ICD-10-CM | POA: Diagnosis present

## 2013-07-11 DIAGNOSIS — N390 Urinary tract infection, site not specified: Secondary | ICD-10-CM | POA: Diagnosis present

## 2013-07-11 DIAGNOSIS — M109 Gout, unspecified: Secondary | ICD-10-CM | POA: Insufficient documentation

## 2013-07-11 DIAGNOSIS — Z792 Long term (current) use of antibiotics: Secondary | ICD-10-CM | POA: Insufficient documentation

## 2013-07-11 LAB — URINALYSIS, ROUTINE W REFLEX MICROSCOPIC
GLUCOSE, UA: NEGATIVE mg/dL
HGB URINE DIPSTICK: NEGATIVE
KETONES UR: 40 mg/dL — AB
Nitrite: POSITIVE — AB
PH: 6 (ref 5.0–8.0)
Protein, ur: NEGATIVE mg/dL
Specific Gravity, Urine: 1.016 (ref 1.005–1.030)
Urobilinogen, UA: 1 mg/dL (ref 0.0–1.0)

## 2013-07-11 LAB — HEPATIC FUNCTION PANEL
ALK PHOS: 61 IU/L (ref 39–117)
ALT: 27 IU/L (ref 0–32)
AST: 21 IU/L (ref 0–40)
Albumin: 4.8 g/dL (ref 3.6–4.8)
Bilirubin, Direct: 0.16 mg/dL (ref 0.00–0.40)
Total Bilirubin: 0.5 mg/dL (ref 0.0–1.2)
Total Protein: 7.1 g/dL (ref 6.0–8.5)

## 2013-07-11 LAB — COMPREHENSIVE METABOLIC PANEL
ALT: 27 U/L (ref 0–35)
AST: 25 U/L (ref 0–37)
Albumin: 4.6 g/dL (ref 3.5–5.2)
Alkaline Phosphatase: 61 U/L (ref 39–117)
BILIRUBIN TOTAL: 0.7 mg/dL (ref 0.3–1.2)
BUN: 11 mg/dL (ref 6–23)
CHLORIDE: 85 meq/L — AB (ref 96–112)
CO2: 23 mEq/L (ref 19–32)
Calcium: 10.3 mg/dL (ref 8.4–10.5)
Creatinine, Ser: 0.91 mg/dL (ref 0.50–1.10)
GFR calc Af Amer: 76 mL/min — ABNORMAL LOW (ref >90)
GFR calc non Af Amer: 65 mL/min — ABNORMAL LOW (ref >90)
Glucose, Bld: 114 mg/dL — ABNORMAL HIGH (ref 70–99)
POTASSIUM: 3.7 meq/L (ref 3.7–5.3)
Sodium: 126 mEq/L — ABNORMAL LOW (ref 137–147)
Total Protein: 8 g/dL (ref 6.0–8.3)

## 2013-07-11 LAB — CBC WITH DIFFERENTIAL/PLATELET
Basophils Absolute: 0 10*3/uL (ref 0.0–0.1)
Basophils Relative: 0 % (ref 0–1)
Eosinophils Absolute: 0 10*3/uL (ref 0.0–0.7)
Eosinophils Relative: 0 % (ref 0–5)
HCT: 38.1 % (ref 36.0–46.0)
HEMOGLOBIN: 13.5 g/dL (ref 12.0–15.0)
Lymphocytes Relative: 16 % (ref 12–46)
Lymphs Abs: 1.8 10*3/uL (ref 0.7–4.0)
MCH: 32.5 pg (ref 26.0–34.0)
MCHC: 35.4 g/dL (ref 30.0–36.0)
MCV: 91.6 fL (ref 78.0–100.0)
MONOS PCT: 7 % (ref 3–12)
Monocytes Absolute: 0.8 10*3/uL (ref 0.1–1.0)
NEUTROS ABS: 9 10*3/uL — AB (ref 1.7–7.7)
Neutrophils Relative %: 77 % (ref 43–77)
Platelets: 319 10*3/uL (ref 150–400)
RBC: 4.16 MIL/uL (ref 3.87–5.11)
RDW: 13.6 % (ref 11.5–15.5)
WBC: 11.6 10*3/uL — ABNORMAL HIGH (ref 4.0–10.5)

## 2013-07-11 LAB — AMYLASE: Amylase: 32 U/L (ref 31–124)

## 2013-07-11 LAB — URINE MICROSCOPIC-ADD ON

## 2013-07-11 LAB — HEPATITIS PANEL, ACUTE
HEP B S AG: NEGATIVE
Hep A IgM: NEGATIVE
Hep B C IgM: NEGATIVE
Hep C Virus Ab: <0.1 s/co ratio (ref 0.0–0.9)

## 2013-07-11 LAB — LIPASE, BLOOD: LIPASE: 14 U/L (ref 11–59)

## 2013-07-11 LAB — LIPASE: Lipase: 18 U/L (ref 0–59)

## 2013-07-11 MED ORDER — ONDANSETRON HCL 4 MG/2ML IJ SOLN
4.0000 mg | Freq: Once | INTRAMUSCULAR | Status: AC
  Administered 2013-07-11: 4 mg via INTRAVENOUS
  Filled 2013-07-11: qty 2

## 2013-07-11 MED ORDER — CEFPODOXIME PROXETIL 200 MG PO TABS: 200.0000 mg | ORAL_TABLET | Freq: Two times a day (BID) | ORAL | Status: AC

## 2013-07-11 MED ORDER — SODIUM CHLORIDE 0.9 % IV SOLN
Freq: Once | INTRAVENOUS | Status: AC
  Administered 2013-07-11: 06:00:00 via INTRAVENOUS

## 2013-07-11 MED ORDER — HYDROCODONE-ACETAMINOPHEN 5-325 MG PO TABS: 1.0000 | ORAL_TABLET | Freq: Four times a day (QID) | ORAL | Status: DC | PRN

## 2013-07-11 MED ORDER — DEXTROSE 5 % IV SOLN
1.0000 g | Freq: Once | INTRAVENOUS | Status: AC
  Administered 2013-07-11: 1 g via INTRAVENOUS
  Filled 2013-07-11: qty 10

## 2013-07-11 MED ORDER — SODIUM CHLORIDE 0.9 % IV BOLUS (SEPSIS)
1000.0000 mL | INTRAVENOUS | Status: AC
  Administered 2013-07-11: 1000 mL via INTRAVENOUS

## 2013-07-11 MED ORDER — HYDROMORPHONE HCL PF 1 MG/ML IJ SOLN
0.5000 mg | Freq: Once | INTRAMUSCULAR | Status: AC
  Administered 2013-07-11: 0.5 mg via INTRAVENOUS
  Filled 2013-07-11: qty 1

## 2013-07-11 NOTE — ED Notes (Signed)
Patient transported to X-ray 

## 2013-07-11 NOTE — ED Notes (Signed)
Pt reports that she started on her 10-day course of Cipro antibiotics for UTI yesterday.  Pt is also taking AZO with her antibiotics.

## 2013-07-11 NOTE — ED Provider Notes (Signed)
CSN: [id number]     Arrival date & time 07/11/13  0507 History   First MD Initiated Contact with Patient 07/11/13 [phone]     Chief Complaint  Patient presents with  . Abdominal Pain     (Consider location/radiation/quality/duration/timing/severity/associated sxs/prior Treatment) Patient is a 63 y.o. female presenting with abdominal pain. The history is provided by the patient.  Abdominal Pain Pain location:  Suprapubic and R flank Pain quality: aching   Pain radiates to:  R flank Pain severity:  Moderate Onset quality:  Gradual Duration:  2 days Timing:  Constant Progression:  Waxing and waning Chronicity:  New Context comment:  At rest Relieved by:  Nothing Worsened by:  Nothing tried Ineffective treatments: tramadol. Associated symptoms: nausea   Associated symptoms: no chest pain, no cough, no diarrhea, no dysuria, no fatigue, no fever, no hematuria, no shortness of breath and no vomiting     Past Medical History  Diagnosis Date  . PONV (postoperative nausea and vomiting)   . Hypertension   . Hypothyroidism   . Arthritis   . Gout   . Neuromuscular disorder   . Hyperlipidemia   . Family history of anesthesia complication     '" MY OLDEST [place] ME "   Past Surgical History  Procedure Laterality Date  . Cervical fusion  '99 & '03  . Rotator cuff surgery  '08    right  . Cataract extraction w/phaco  12/13/2010    Procedure: CATARACT EXTRACTION PHACO AND INTRAOCULAR LENS PLACEMENT (IOC);  Surgeon: Tonny Branch;  Location: [facility];  Service: Ophthalmology;  Laterality: Right;  CDE: 8.94  . Eye surgery    . Back surgery      2009  . Back surgery  2011    lumbar surgery  . Tubal ligation  1979  . Rotator cuff repair Left 04/09/2013    DR Marlou Sa  . Shoulder arthroscopy with subacromial decompression and open rotator c Left 04/09/2013    Procedure: SHOULDER ARTHROSCOPY WITH SUBACROMIAL DECOMPRESSION AND MINI OPEN ROTATOR CUFF REPAIR, POSSIBLE BICEP  TENDONODESIS,  ;  Surgeon: Meredith Pel, MD;  Location: [facility];  Service: Orthopedics;  Laterality: Left;   Family History  Problem Relation Age of Onset  . Anesthesia problems Neg Hx   . Hypotension Neg Hx   . Malignant hyperthermia Neg Hx   . Pseudochol deficiency Neg Hx   . Diabetes Sister   . Stroke Mother   . Heart disease Father   . Stroke Father   . Diabetes Brother   . Diabetes Sister   . Diabetes Sister   . Diabetes Sister    History  Substance Use Topics  . Smoking status: Never Smoker   . Smokeless tobacco: Never Used  . Alcohol Use: No   OB History   Grav Para Term Preterm Abortions TAB SAB Ect Mult Living                 Review of Systems  Constitutional: Negative for fever and fatigue.  HENT: Negative for congestion and drooling.   Eyes: Negative for pain.  Respiratory: Negative for cough and shortness of breath.   Cardiovascular: Negative for chest pain.  Gastrointestinal: Positive for nausea and abdominal pain. Negative for vomiting and diarrhea.  Genitourinary: Negative for dysuria and hematuria.  Musculoskeletal: Negative for back pain, gait problem and neck pain.  Skin: Negative for color change.  Neurological: Negative for dizziness and headaches.  Hematological: Negative for adenopathy.  Psychiatric/Behavioral: Negative for behavioral problems.  All other systems reviewed and are negative.      Allergies  Livalo and Simvastatin  Home Medications   Current Outpatient Rx  Name  Route  Sig  Dispense  Refill  . allopurinol (ZYLOPRIM) 300 MG tablet   Oral   Take 1 tablet (300 mg total) by mouth daily.   30 tablet   5   . B Complex-C (SUPER B COMPLEX PO)   Oral   Take 1 tablet by mouth daily.         . Biotin 5000 MCG CAPS   Oral   Take 5,000 mcg by mouth daily.         . Black Cohosh 40 MG CAPS   Oral   Take 40 mg by mouth daily.         . Cholecalciferol (VITAMIN D3) 2000 UNITS TABS   Oral   Take 1 tablet by mouth  daily.           . ciprofloxacin (CIPRO) 500 MG tablet   Oral   Take 1 tablet (500 mg total) by mouth 2 (two) times daily.   20 tablet   0   . diclofenac (VOLTAREN) 50 MG EC tablet   Oral   Take 50 mg by mouth 2 (two) times daily as needed for mild pain or moderate pain.         [name] HYDROcodone-homatropine (HYCODAN) 5-1.5 MG/5ML syrup   Oral   Take 5 mLs by mouth every 8 (eight) hours as needed for cough.   120 mL   0   . lisinopril-hydrochlorothiazide (PRINZIDE,ZESTORETIC) 20-12.5 MG per tablet   Oral   Take 1 tablet by mouth daily.   30 tablet   5   . Omega-3 Fatty Acids (FISH OIL) 1200 MG CAPS   Oral   Take 1,200 mg by mouth daily.         . rosuvastatin (CRESTOR) 10 MG tablet   Oral   Take 1 tablet (10 mg total) by mouth daily.   30 tablet   5   . SYNTHROID 88 MCG tablet   Oral   Take 1 tablet (88 mcg total) by mouth daily before breakfast.   30 tablet   5     Dispense as written.   . vitamin C (ASCORBIC ACID) 500 MG tablet   Oral   Take 500 mg by mouth daily.          BP 134/70  Pulse 90  Temp(Src) 98.4 F (36.9 C) (Oral)  Resp 20  Ht 5\' 5"  (1.651 m)  Wt 193 lb (87.544 kg)  BMI 32.12 kg/m2  SpO2 95% Physical Exam  Nursing note and vitals reviewed. Constitutional: She is oriented to person, place, and time. She appears well-developed and well-nourished.  HENT:  Head: Normocephalic.  Mouth/Throat: Oropharynx is clear and moist. No oropharyngeal exudate.  Eyes: Conjunctivae and EOM are normal. Pupils are equal, round, and reactive to light.  Neck: Normal range of motion. Neck supple.  Cardiovascular: Normal rate, regular rhythm, normal heart sounds and intact distal pulses.  Exam reveals no gallop and no friction rub.   No murmur heard. Pulmonary/Chest: Effort normal and breath sounds normal. No respiratory distress. She has no wheezes.  Abdominal: Soft. Bowel sounds are normal. There is tenderness (mild suprapubic tenderness to palpation.).  There is no rebound and no guarding.  Musculoskeletal: Normal range of motion. She exhibits no edema and no tenderness.  No CVA  tenderness to palpation bilaterally.  Neurological: She is alert and oriented to person, place, and time.  Skin: Skin is warm and dry.  Psychiatric: She has a normal mood and affect. Her behavior is normal.    ED Course  Procedures (including critical care time) Labs Review Labs Reviewed  CBC WITH DIFFERENTIAL - Abnormal; Notable for the following:    WBC 11.6 (*)    Neutro Abs 9.0 (*)    All other components within normal limits  COMPREHENSIVE METABOLIC PANEL - Abnormal; Notable for the following:    Sodium 126 (*)    Chloride 85 (*)    Glucose, Bld 114 (*)    GFR calc non Af Amer 65 (*)    GFR calc Af Amer 76 (*)    All other components within normal limits  URINALYSIS, ROUTINE W REFLEX MICROSCOPIC - Abnormal; Notable for the following:    Color, Urine ORANGE (*)    Bilirubin Urine SMALL (*)    Ketones, ur 40 (*)    Nitrite POSITIVE (*)    Leukocytes, UA SMALL (*)    All other components within normal limits  URINE MICROSCOPIC-ADD ON - Abnormal; Notable for the following:    Bacteria, UA FEW (*)    All other components within normal limits  LIPASE, BLOOD   Imaging Review Mr Abdomen W Wo Contrast  07/10/2013   CLINICAL DATA Left-sided abdominal pain for the past 5 months. Abnormal CT scan demonstrating potential liver mass.  EXAM MRI ABDOMEN WITHOUT AND WITH CONTRAST  TECHNIQUE Multiplanar multisequence MR imaging of the abdomen was performed both before and after the administration of intravenous contrast.  CONTRAST 69mL MULTIHANCE GADOBENATE DIMEGLUMINE 529 MG/ML IV SOLN  COMPARISON CT of the abdomen and pelvis 06/26/2013.  FINDINGS There are multiple hepatic lesions. These lesions are predominantly low T1 signal intensity, heterogeneous in signal intensity on T2 weighted images, and demonstrate avid heterogeneous enhancement on post gadolinium  images, highly concerning for potential multifocal hepatocellular carcinoma. The largest of these lesions is centered in segments 4B and 5 of the liver measuring up to 6.0 x 8.7 x 6.0 cm (image 49 of series 5007 and image 58 of series 25. Several other lesions are seen scattered throughout the hepatic parenchyma as well, predominantly in the left lobe of the liver. The unaffected hepatic parenchyma is also remarkable for mild diffuse loss of signal intensity on out of phase dual echo images, compatible with hepatic steatosis. No intrahepatic biliary ductal dilatation. No significant hepatoduodenal or portacaval lymphadenopathy at this time.  Gallbladder is unremarkable in appearance. Common bile duct is not dilated. The appearance of the pancreas, spleen, bilateral adrenal glands and bilateral kidneys is unremarkable.  IMPRESSION 1. Multifocal hepatic masses and nodules highly concerning for multifocal hepatocellular carcinoma, as discussed above. 2. Although there are no overt cirrhotic changes in the liver identified at this time, there is a background of hepatic steatosis.  SIGNATURE  Electronically Signed   By: Vinnie Langton M.D.   On: 07/10/2013 11:19     EKG Interpretation None      MDM   Final diagnoses:  UTI (lower urinary tract infection)  Suprapubic pain  Mild dehydration    7:13 AM 63 y.o. female who presents with suprapubic pressure, frequency, dysuria which began 2 days ago. She denies any fevers, vomiting, or diarrhea. She has had mild nausea. Of note she recently had a CT scan and MRI (2/25) of her abdomen in the last several weeks. Found to have  Multifocal hepatic masses and nodules  concerning for multifocal hepatocellular carcinoma. Pt's pcp is scheduling a bx. the patient is afebrile here and her vital signs are unremarkable. She has a very mildly elevated white count and a urinalysis suspicious for infection. She was seen by her PCP yesterday and had a noncontributory urine  dipstick at that time. She was started on Cipro. She notes ongoing suprapubic pain now radiating to her right flank. Given recent MR of abdomen, will get screening plain films of abdomen and treatment of patient's pain. Will also give a dose of IV Rocephin for likely culprit which is a UTI. Last bm 2 days ago.  9:50 AM: Pts' abd remains soft and benign. Pain improved w/ 0.5mg  dilaudid. Pt had low NaCl on labs, w/ ketones in UA, likely mild dehydration. She is s/p 2L IVF here. She is ambulatory and urinating normally. Will switch Abx from cipro to vantin.  I have discussed the diagnosis/risks/treatment options with the patient and family and believe the pt to be eligible for discharge home to follow-up with pcp in 1 day if no better. We also discussed returning to the ED immediately if new or worsening sx occur. We discussed the sx which are most concerning (e.g., worsening pain, fever, vomiting) that necessitate immediate return. Medications administered to the patient during their visit and any new prescriptions provided to the patient are listed below.  Medications given during this visit Medications  ondansetron (ZOFRAN) injection 4 mg (4 mg Intravenous Given 07/11/13 0617)  0.9 %  sodium chloride infusion ( Intravenous Stopped 07/11/13 0817)  HYDROmorphone (DILAUDID) injection 0.5 mg (0.5 mg Intravenous Given 07/11/13 0740)  ondansetron (ZOFRAN) injection 4 mg (4 mg Intravenous Given 07/11/13 0740)  cefTRIAXone (ROCEPHIN) 1 g in dextrose 5 % 50 mL IVPB (0 g Intravenous Stopped 07/11/13 0743)  sodium chloride 0.9 % bolus 1,000 mL (1,000 mLs Intravenous New Bag/Given 07/11/13 0844)    New Prescriptions   CEFPODOXIME (VANTIN) 200 MG TABLET    Take 1 tablet (200 mg total) by mouth 2 (two) times daily.   HYDROCODONE-ACETAMINOPHEN (NORCO/VICODIN) 5-325 MG PER TABLET    Take 1 tablet by mouth every 6 (six) hours as needed for moderate pain.        Blanchard Kelch, MD 07/11/13 [id number]

## 2013-07-11 NOTE — ED Notes (Signed)
Pt reports lower ab pain and L upper side pain that started Tuesday that has not gotten any better. Pt reports nausea and an increased frequency to go to the bathroom. Pt denies any blood in urine. Pt states she took tramadol and nausea pills with no relief of pain or nausea. Pt alert and sitting in wheelchair. Family in room.

## 2013-07-15 ENCOUNTER — Telehealth: Payer: Self-pay | Admitting: Nurse Practitioner

## 2013-07-15 NOTE — Telephone Encounter (Signed)
i will make sure they are working on it

## 2013-07-23 ENCOUNTER — Ambulatory Visit: Payer: 59 | Admitting: Physical Therapy

## 2013-07-23 DIAGNOSIS — Z0289 Encounter for other administrative examinations: Secondary | ICD-10-CM

## 2013-07-29 ENCOUNTER — Ambulatory Visit (INDEPENDENT_AMBULATORY_CARE_PROVIDER_SITE_OTHER): Payer: 59 | Admitting: Surgery

## 2013-07-29 ENCOUNTER — Telehealth: Payer: Self-pay | Admitting: Nurse Practitioner

## 2013-07-30 NOTE — Telephone Encounter (Signed)
Patient is aware that Lindsay Aguirre has forms and that we will call her as soon as she gets them finished

## 2013-07-30 NOTE — Telephone Encounter (Signed)
Have you got a form on her yet?

## 2013-07-30 NOTE — Telephone Encounter (Signed)
i am off all week so will do when i get back- didn't have info before i left to complete- Lindsay Aguirre is you can find form seee if you can fill out- I have a form on my desk for someone.

## 2013-08-05 ENCOUNTER — Telehealth: Payer: Self-pay | Admitting: Oncology

## 2013-08-05 ENCOUNTER — Ambulatory Visit (INDEPENDENT_AMBULATORY_CARE_PROVIDER_SITE_OTHER): Payer: 59 | Admitting: General Surgery

## 2013-08-05 ENCOUNTER — Encounter (INDEPENDENT_AMBULATORY_CARE_PROVIDER_SITE_OTHER): Payer: Self-pay | Admitting: General Surgery

## 2013-08-05 VITALS — BP 126/82 | HR 78 | Temp 97.8°F | Ht 65.0 in | Wt 184.0 lb

## 2013-08-05 DIAGNOSIS — C220 Liver cell carcinoma: Secondary | ICD-10-CM

## 2013-08-05 DIAGNOSIS — C221 Intrahepatic bile duct carcinoma: Secondary | ICD-10-CM | POA: Insufficient documentation

## 2013-08-05 DIAGNOSIS — C228 Malignant neoplasm of liver, primary, unspecified as to type: Secondary | ICD-10-CM

## 2013-08-05 HISTORY — DX: Intrahepatic bile duct carcinoma: C22.1

## 2013-08-05 LAB — APTT: APTT: 29 s (ref 24–37)

## 2013-08-05 LAB — PROTIME-INR
INR: 1.02 (ref <1.50)
PROTHROMBIN TIME: 13.3 s (ref 11.6–15.2)

## 2013-08-05 LAB — HEPATITIS C ANTIBODY: HCV Ab: NEGATIVE

## 2013-08-05 LAB — HEPATITIS B CORE ANTIBODY, TOTAL: Hep B Core Total Ab: NONREACTIVE

## 2013-08-05 NOTE — Patient Instructions (Signed)
Get Chest CT Labs - includes Tumor marker (afp) Hepatitis panel Coagulation studies  Referral to interventional radiology and oncology.

## 2013-08-05 NOTE — Telephone Encounter (Signed)
LEFT MESSAGER FOR PATIENT TO RETURN CALL TO SCHEDULE NP APPT.

## 2013-08-06 ENCOUNTER — Telehealth: Payer: Self-pay | Admitting: Oncology

## 2013-08-06 LAB — AFP TUMOR MARKER: AFP TUMOR MARKER: 2.6 ng/mL (ref 0.0–8.0)

## 2013-08-06 NOTE — Telephone Encounter (Signed)
C/D 08/06/13 for appt. 08/16/13

## 2013-08-07 ENCOUNTER — Ambulatory Visit (INDEPENDENT_AMBULATORY_CARE_PROVIDER_SITE_OTHER): Payer: 59 | Admitting: Nurse Practitioner

## 2013-08-07 ENCOUNTER — Ambulatory Visit: Payer: 59 | Admitting: Nurse Practitioner

## 2013-08-07 ENCOUNTER — Encounter: Payer: Self-pay | Admitting: Nurse Practitioner

## 2013-08-07 VITALS — BP 107/64 | HR 77 | Temp 98.0°F | Ht 65.0 in | Wt 183.4 lb

## 2013-08-07 DIAGNOSIS — K5790 Diverticulosis of intestine, part unspecified, without perforation or abscess without bleeding: Secondary | ICD-10-CM

## 2013-08-07 DIAGNOSIS — C220 Liver cell carcinoma: Secondary | ICD-10-CM

## 2013-08-07 DIAGNOSIS — M109 Gout, unspecified: Secondary | ICD-10-CM

## 2013-08-07 DIAGNOSIS — E039 Hypothyroidism, unspecified: Secondary | ICD-10-CM

## 2013-08-07 DIAGNOSIS — E785 Hyperlipidemia, unspecified: Secondary | ICD-10-CM

## 2013-08-07 DIAGNOSIS — I10 Essential (primary) hypertension: Secondary | ICD-10-CM

## 2013-08-07 DIAGNOSIS — C228 Malignant neoplasm of liver, primary, unspecified as to type: Secondary | ICD-10-CM

## 2013-08-07 DIAGNOSIS — K5730 Diverticulosis of large intestine without perforation or abscess without bleeding: Secondary | ICD-10-CM

## 2013-08-07 MED ORDER — LISINOPRIL-HYDROCHLOROTHIAZIDE 20-12.5 MG PO TABS: 1.0000 | ORAL_TABLET | Freq: Every day | ORAL | Status: DC

## 2013-08-07 MED ORDER — SYNTHROID 88 MCG PO TABS: 88.0000 ug | ORAL_TABLET | Freq: Every day | ORAL | Status: DC

## 2013-08-07 MED ORDER — HYDROCODONE-ACETAMINOPHEN 5-325 MG PO TABS: 1.0000 | ORAL_TABLET | Freq: Four times a day (QID) | ORAL | Status: DC | PRN

## 2013-08-07 MED ORDER — ALLOPURINOL 300 MG PO TABS: 300.0000 mg | ORAL_TABLET | Freq: Every day | ORAL | Status: DC

## 2013-08-07 NOTE — Progress Notes (Signed)
Subjective:    Patient ID: Lindsay Aguirre, female    DOB: 08/21/1950, 63 y.o.   MRN: [id number]  Patient here today for follow up of chronic medical problems. PAtient has been diagnosed with Liver cancer and has consultation with interventional radiologist next week. Patient says that she is so worried that she is unable to sleep. SHe is having right upper back pain that she needs something for from time to time   Hyperlipidemia This is a chronic problem. The current episode started more than 1 year ago. The problem is uncontrolled. Recent lipid tests were reviewed and are high. Exacerbating diseases include hypothyroidism and obesity. There are no known factors aggravating her hyperlipidemia. Pertinent negatives include no chest pain, focal sensory loss, leg pain, myalgias or shortness of breath. Current antihyperlipidemic treatment includes statins. Improvement on treatment: will have to see when labs are back. Compliance problems include adherence to diet and adherence to exercise.  Risk factors for coronary artery disease include hypertension, obesity and post-menopausal.  Hypertension This is a chronic problem. The current episode started more than 1 year ago. The problem has been resolved since onset. The problem is controlled. Pertinent negatives include no blurred vision, chest pain, headaches, malaise/fatigue, palpitations, peripheral edema, shortness of breath or sweats. There are no associated agents to hypertension. Risk factors for coronary artery disease include dyslipidemia, obesity and post-menopausal state. Past treatments include ACE inhibitors and diuretics. The current treatment provides significant improvement. Compliance problems include diet and exercise.  Hypertensive end-organ damage includes a thyroid problem.  Thyroid Problem Presents for follow-up (hypothyroidism) visit. Patient reports no anxiety, cold intolerance, constipation, fatigue, hoarse voice, leg swelling,  menstrual problem, palpitations, visual change, weight gain or weight loss. The symptoms have been stable. Her past medical history is significant for hyperlipidemia.  GOUT Allopurinol- daily- Hasn't had a flare up in .6 years.    Review of Systems  Constitutional: Negative for weight loss, weight gain, malaise/fatigue and fatigue.  HENT: Negative for hoarse voice.   Eyes: Negative for blurred vision.  Respiratory: Negative for shortness of breath.   Cardiovascular: Negative for chest pain and palpitations.  Gastrointestinal: Negative for constipation.  Endocrine: Negative for cold intolerance.  Genitourinary: Negative for menstrual problem.  Musculoskeletal: Negative for myalgias.  Neurological: Negative for headaches.  All other systems reviewed and are negative.      Objective:   Physical Exam  Constitutional: She is oriented to person, place, and time. She appears well-developed and well-nourished.  HENT:  Nose: Nose normal.  Mouth/Throat: Oropharynx is clear and moist.  Eyes: EOM are normal.  Neck: Trachea normal, normal range of motion and full passive range of motion without pain. Neck supple. No JVD present. Carotid bruit is not present. No thyromegaly present.  Cardiovascular: Normal rate, regular rhythm, normal heart sounds and intact distal pulses.  Exam reveals no gallop and no friction rub.   No murmur heard. Pulmonary/Chest: Effort normal and breath sounds normal.  Abdominal: Soft. Bowel sounds are normal. She exhibits no distension and no mass. There is no tenderness.  Musculoskeletal: Normal range of motion.  Lymphadenopathy:    She has no cervical adenopathy.  Neurological: She is alert and oriented to person, place, and time. She has normal reflexes.  Skin: Skin is warm and dry.  Psychiatric: She has a normal mood and affect. Her behavior is normal. Judgment and thought content normal.     BP 107/64  Pulse 77  Temp(Src) 98 F (36.7 C) (Oral)  Ht  _0   (1.651 m)  Wt 183 lb 6.4 oz (83.19 kg)  BMI 30.52 kg/m2      Assessment & Plan:   1. Hypothyroidism   2. Hypertension   3. Hyperlipidemia   4. HCC (hepatocellular carcinoma)   5. Gout   6. Diverticulosis    Orders Placed This Encounter  Procedures  . CMP14+EGFR  . NMR, lipoprofile  . Thyroid Panel With TSH   Meds ordered this encounter  Medications  . allopurinol (ZYLOPRIM) 300 MG tablet    Sig: Take 1 tablet (300 mg total) by mouth daily.    Dispense:  30 tablet    Refill:  5    Order Specific Question:  Supervising Provider    Answer:  Chipper Herb [[id number]]  . lisinopril-hydrochlorothiazide (PRINZIDE,ZESTORETIC) 20-12.5 MG per tablet    Sig: Take 1 tablet by mouth daily.    Dispense:  30 tablet    Refill:  5    Order Specific Question:  Supervising Provider    Answer:  Chipper Herb [[id number]]  . SYNTHROID 88 MCG tablet    Sig: Take 1 tablet (88 mcg total) by mouth daily before breakfast.    Dispense:  30 tablet    Refill:  5    Order Specific Question:  Supervising Provider    Answer:  Chipper Herb [[id number]]  . HYDROcodone-acetaminophen (NORCO/VICODIN) 5-325 MG per tablet    Sig: Take 1 tablet by mouth every 6 (six) hours as needed for moderate pain.    Dispense:  10 tablet    Refill:  0    Order Specific Question:  Supervising Provider    Answer:  Chipper Herb [[id number]]   Call if need something for nerves when all of radiation treatments start Labs pending Health maintenance reviewed Diet and exercise encouraged Continue all meds Follow up  In 3 months   [place], FNP

## 2013-08-07 NOTE — Patient Instructions (Signed)

## 2013-08-07 NOTE — Assessment & Plan Note (Addendum)
The patient appears to have multifocal hepatocellular carcinoma If this is the case, she is not a surgical candidate. She is also not a transplant candidate based on the number of lesions. If her AFP is elevated then she will likely not need a biopsy. If however, her AFP is normal, she will need a biopsy. I will go ahead and refer her to interventional radiology to assess her appropriateness for percutaneous techniques. I will also refer to oncology for consideration of other therapies. I will see her back on an as-needed basis.  Also call she is looking to have hepatitis serologies drawn or recent coagulation studies. Is a possibility of a biopsy, I will go ahead and order correct. I will also order the other serologies. She does not have evidence of alcoholic cirrhosis. She likely has a history of NASH that was undiagnosed.  40 min were spent in evaluation, examination, counseling, and coordination of care.

## 2013-08-07 NOTE — Progress Notes (Signed)
Chief Complaint  Patient presents with  . eval liver mass    new pt    HISTORY: Patient is a 8 old female who is referred for consultation by Ronnald Collum, FNP and [name] M.D. for evaluation of multifocal liver masses.  She has not had any known history of cirrhosis or alcohol use. She has never had hepatitis for which she is aware. She presented with abdominal pain. The pain is primarily across the upper abdomen, but can be in her back as well.  She has had some weight loss, but this was purposeful. She denies any history of GI bleed. She has not ever had any blood transfusion other than an autotransfusion during spine surgery. She has never had any intravenous drug use.  She has a sister with colon cancer and ovarian cancer. The sister has had genetic testing which is reportedly negative.  The patient is up-to-date on her colonoscopy. She is due for mammogram.  Past Medical History  Diagnosis Date  . PONV (postoperative nausea and vomiting)   . Hypertension   . Hypothyroidism   . Arthritis   . Gout   . Neuromuscular disorder   . Hyperlipidemia   . Family history of anesthesia complication     '" MY OLDEST [employer] ME "    Past Surgical History  Procedure Laterality Date  . Cervical fusion  '99 & '03  . Rotator cuff surgery  '08    right  . Cataract extraction w/phaco  12/13/2010    Procedure: CATARACT EXTRACTION PHACO AND INTRAOCULAR LENS PLACEMENT (IOC);  Surgeon: Tonny Branch;  Location: [facility];  Service: Ophthalmology;  Laterality: Right;  CDE: 8.94  . Eye surgery    . Back surgery      2009  . Back surgery  2011    lumbar surgery  . Tubal ligation  1979  . Rotator cuff repair Left 04/09/2013    DR Marlou Sa  . Shoulder arthroscopy with subacromial decompression and open rotator c Left 04/09/2013    Procedure: SHOULDER ARTHROSCOPY WITH SUBACROMIAL DECOMPRESSION AND MINI OPEN ROTATOR CUFF REPAIR, POSSIBLE BICEP TENDONODESIS,  ;  Surgeon: Meredith Pel, MD;   Location: [place];  Service: Orthopedics;  Laterality: Left;    Current Outpatient Prescriptions  Medication Sig Dispense Refill  . allopurinol (ZYLOPRIM) 300 MG tablet Take 1 tablet (300 mg total) by mouth daily.  30 tablet  5  . Cholecalciferol (VITAMIN D3) 2000 UNITS TABS Take 1 tablet by mouth daily.        [name] HYDROcodone-acetaminophen (NORCO/VICODIN) 5-325 MG per tablet Take 1 tablet by mouth every 6 (six) hours as needed for moderate pain.  10 tablet  0  . lisinopril-hydrochlorothiazide (PRINZIDE,ZESTORETIC) 20-12.5 MG per tablet Take 1 tablet by mouth daily.  30 tablet  5  . SYNTHROID 88 MCG tablet Take 1 tablet (88 mcg total) by mouth daily before breakfast.  30 tablet  5  . vitamin C (ASCORBIC ACID) 500 MG tablet Take 500 mg by mouth daily.       No current facility-administered medications for this visit.     Allergies  Allergen Reactions  . Livalo [Pitavastatin]   . Simvastatin      Family History  Problem Relation Age of Onset  . Anesthesia problems Neg Hx   . Hypotension Neg Hx   . Malignant hyperthermia Neg Hx   . Pseudochol deficiency Neg Hx   . Diabetes Sister   . Stroke Mother   .  Heart disease Father   . Stroke Father   . Diabetes Brother   . Diabetes Sister   . Diabetes Sister   . Diabetes Sister      History   Social History  . Marital Status: Divorced    Spouse Name: N/A    Number of Children: N/A  . Years of Education: N/A   Social History Main Topics  . Smoking status: Never Smoker   . Smokeless tobacco: Never Used  . Alcohol Use: No  . Drug Use: No  . Sexual Activity: None   Other Topics Concern  . None   Social History Narrative  . None     REVIEW OF SYSTEMS - PERTINENT POSITIVES ONLY: 12 point review of systems negative other than HPI and PMH   EXAM: Filed Vitals:   08/05/13 1007  BP: 126/82  Pulse: 78  Temp: 97.8 F (36.6 C)    Wt Readings from Last 3 Encounters:  08/07/13 183 lb 6.4 oz (83.19 kg)  08/05/13 184 lb  (83.462 kg)  07/11/13 193 lb (87.544 kg)     Gen:  No acute distress.  Well nourished and well groomed.   Neurological: Alert and oriented to person, place, and time. Coordination normal.  Head: Normocephalic and atraumatic.  Eyes: Conjunctivae are normal. Pupils are equal, round, and reactive to light. No scleral icterus.  Neck: Normal range of motion. Neck supple. No tracheal deviation or thyromegaly present.  Cardiovascular: Normal rate, regular rhythm, normal heart sounds and intact distal pulses.  Exam reveals no gallop and no friction rub.  No murmur heard. Respiratory: Effort normal.  No respiratory distress. No chest wall tenderness. Breath sounds normal.  No wheezes, rales or rhonchi.  GI: Soft. Bowel sounds are normal. The abdomen is soft and nondistended.  There is mild RUQ tenderness.  There is no rebound and no guarding.  Musculoskeletal: Normal range of motion. Extremities are nontender.  Lymphadenopathy: No cervical, preauricular, postauricular or axillary adenopathy is present Skin: Skin is warm and dry. No rash noted. No diaphoresis. No erythema. No pallor. No clubbing, cyanosis, or edema.   Psychiatric: Normal mood and affect. Behavior is normal. Judgment and thought content normal.    LABORATORY RESULTS: Available labs are reviewed   Recent Results (from the past 2160 hour(s))  POCT INFLUENZA A/B     Status: None   Collection Time    06/17/13 12:11 PM      Result Value Ref Range   Influenza A, POC Negative     Influenza B, POC Negative    BMP8+EGFR     Status: Abnormal   Collection Time    07/04/13  1:16 PM      Result Value Ref Range   Glucose 134 (*) 65 - 99 mg/dL   BUN 17  8 - 27 mg/dL   Creatinine, Ser 0.84  0.57 - 1.00 mg/dL   GFR calc non Af Amer 74  >59 mL/min/1.73   GFR calc Af Amer 85  >59 mL/min/1.73   BUN/Creatinine Ratio 20  11 - 26   Sodium 135  134 - 144 mmol/L   Potassium 4.0  3.5 - 5.2 mmol/L   Chloride 93 (*) 97 - 108 mmol/L   CO2 22  18 -  29 mmol/L   Calcium 9.4  8.7 - 10.3 mg/dL  POCT UA - MICROSCOPIC ONLY     Status: Normal   Collection Time    07/10/13  2:50 PM      Result  Value Ref Range   WBC, Ur, HPF, POC neg     RBC, urine, microscopic neg     Bacteria, U Microscopic neg     Mucus, UA neg     Epithelial cells, urine per micros occ     Crystals, Ur, HPF, POC neg     Casts, Ur, LPF, POC neg     Yeast, UA neg    POCT URINALYSIS DIPSTICK     Status: None   Collection Time    07/10/13  2:50 PM      Result Value Ref Range   Color, UA yellow     Clarity, UA clear     Glucose, UA neg     Bilirubin, UA neg     Ketones, UA neg     Spec Grav, UA 1.010     Blood, UA neg     pH, UA 7.0     Protein, UA neg     Urobilinogen, UA negative     Nitrite, UA neg     Leukocytes, UA Negative    LIPASE     Status: None   Collection Time    07/10/13  3:20 PM      Result Value Ref Range   Lipase 18  0 - 59 U/L  AMYLASE     Status: None   Collection Time    07/10/13  3:20 PM      Result Value Ref Range   Amylase 32  31 - 124 U/L  HEPATIC FUNCTION PANEL     Status: None   Collection Time    07/10/13  3:20 PM      Result Value Ref Range   Total Protein 7.1  6.0 - 8.5 g/dL   Albumin 4.8  3.6 - 4.8 g/dL   Total Bilirubin 0.5  0.0 - 1.2 mg/dL   Bilirubin, Direct 0.16  0.00 - 0.40 mg/dL   Alkaline Phosphatase 61  39 - 117 IU/L   AST 21  0 - 40 IU/L   ALT 27  0 - 32 IU/L  HEPATITIS PANEL, ACUTE     Status: None   Collection Time    07/10/13  3:20 PM      Result Value Ref Range   Hep A IgM Negative  Negative   Hepatitis B Surface Ag Negative  Negative   Hep B C IgM Negative  Negative   Hep C Virus Ab <0.1  0.0 - 0.9 s/co ratio   Comment:                                   Negative:     < 0.8                                  Indeterminate: 0.8 - 0.9                                       Positive:     > 0.9       In order to reduce the incidence of a false positive       result, the CDC recommends that all s/co ratios        between 1.0 and 10.9 be confirmed by a more specific  supplemental or PCR testing. [facility] offers HCV Ab       w/Reflex to Verification test [phone].  URINALYSIS, ROUTINE W REFLEX MICROSCOPIC     Status: Abnormal   Collection Time    07/11/13  6:08 AM      Result Value Ref Range   Color, Urine ORANGE (*) YELLOW   Comment: BIOCHEMICALS MAY BE AFFECTED BY COLOR   APPearance CLEAR  CLEAR   Specific Gravity, Urine 1.016  1.005 - 1.030   pH 6.0  5.0 - 8.0   Glucose, UA NEGATIVE  NEGATIVE mg/dL   Hgb urine dipstick NEGATIVE  NEGATIVE   Bilirubin Urine SMALL (*) NEGATIVE   Ketones, ur 40 (*) NEGATIVE mg/dL   Protein, ur NEGATIVE  NEGATIVE mg/dL   Urobilinogen, UA 1.0  0.0 - 1.0 mg/dL   Nitrite POSITIVE (*) NEGATIVE   Leukocytes, UA SMALL (*) NEGATIVE  URINE MICROSCOPIC-ADD ON     Status: Abnormal   Collection Time    07/11/13  6:08 AM      Result Value Ref Range   Squamous Epithelial / LPF RARE  RARE   WBC, UA 0-2  <3 WBC/hpf   Bacteria, UA FEW (*) RARE  CBC WITH DIFFERENTIAL     Status: Abnormal   Collection Time    07/11/13  6:10 AM      Result Value Ref Range   WBC 11.6 (*) 4.0 - 10.5 K/uL   RBC 4.16  3.87 - 5.11 MIL/uL   Hemoglobin 13.5  12.0 - 15.0 g/dL   HCT 38.1  36.0 - 46.0 %   MCV 91.6  78.0 - 100.0 fL   MCH 32.5  26.0 - 34.0 pg   MCHC 35.4  30.0 - 36.0 g/dL   RDW 13.6  11.5 - 15.5 %   Platelets 319  150 - 400 K/uL   Neutrophils Relative % 77  43 - 77 %   Neutro Abs 9.0 (*) 1.7 - 7.7 K/uL   Lymphocytes Relative 16  12 - 46 %   Lymphs Abs 1.8  0.7 - 4.0 K/uL   Monocytes Relative 7  3 - 12 %   Monocytes Absolute 0.8  0.1 - 1.0 K/uL   Eosinophils Relative 0  0 - 5 %   Eosinophils Absolute 0.0  0.0 - 0.7 K/uL   Basophils Relative 0  0 - 1 %   Basophils Absolute 0.0  0.0 - 0.1 K/uL  COMPREHENSIVE METABOLIC PANEL     Status: Abnormal   Collection Time    07/11/13  6:10 AM      Result Value Ref Range   Sodium 126 (*) 137 - 147 mEq/L   Potassium 3.7  3.7 -  5.3 mEq/L   Chloride 85 (*) 96 - 112 mEq/L   CO2 23  19 - 32 mEq/L   Glucose, Bld 114 (*) 70 - 99 mg/dL   BUN 11  6 - 23 mg/dL   Creatinine, Ser 0.91  0.50 - 1.10 mg/dL   Calcium 10.3  8.4 - 10.5 mg/dL   Total Protein 8.0  6.0 - 8.3 g/dL   Albumin 4.6  3.5 - 5.2 g/dL   AST 25  0 - 37 U/L   ALT 27  0 - 35 U/L   Alkaline Phosphatase 61  39 - 117 U/L   Total Bilirubin 0.7  0.3 - 1.2 mg/dL   GFR calc non Af Amer 65 (*) >90 mL/min   GFR calc Af Amer 76 (*) >  90 mL/min   Comment: (NOTE)     The eGFR has been calculated using the CKD EPI equation.     This calculation has not been validated in all clinical situations.     eGFR's persistently <90 mL/min signify possible Chronic Kidney     Disease.  LIPASE, BLOOD     Status: None   Collection Time    07/11/13  6:10 AM      Result Value Ref Range   Lipase 14  11 - 59 U/L  AFP TUMOR MARKER     Status: None   Collection Time    08/05/13 12:10 PM      Result Value Ref Range   AFP-Tumor Marker 2.6  0.0 - 8.0 ng/mL   Comment:       The [employer]ia Centaur AFP immunoassay method is used.  Results obtained     with different assay methods or kits cannot be used interchangeably.     AFP is a valuable aid in the management of nonseminomatous testicular     cancer patients when used in conjunction with information available     from the clinical evaluation and other diagnostic procedures.     Increased serum AFP concentrations have also been observed in ataxia     telangiectasia, hereditary tyrosinemia, primary hepatocellular     carcinoma, teratocarcinoma, gastrointestinal tract cancers with and     without liver metastases, and in benign hepatic conditions such as     acute viral hepatitis, chronic active hepatitis, and cirrhosis.  This     result cannot be interpreted as absolute evidence of the presence or     absence of malignant disease.  This result is not interpretable in     pregnant females.  PROTIME-INR     Status: None   Collection Time     08/05/13 12:10 PM      Result Value Ref Range   Prothrombin Time 13.3  11.6 - 15.2 seconds   INR 1.02  <1.50   Comment: The INR is of principal utility in following patients on stable doses     of oral anticoagulants.  The therapeutic range is generally 2.0 to     3.0, but may be 3.0 to 4.0 in patients with mechanical cardiac valves,     recurrent embolisms and antiphospholipid antibodies (including lupus     inhibitors).  APTT     Status: None   Collection Time    08/05/13 12:10 PM      Result Value Ref Range   aPTT 29  24 - 37 seconds   Comment: This test is for screening purposes only; it should not be used for     therapeutic unfractionated heparin monitoring.  Please refer to     Heparin Anti-Xa (89373).  HEPATITIS B CORE ANTIBODY, TOTAL     Status: None   Collection Time    08/05/13 12:10 PM      Result Value Ref Range   Hep B Core Total Ab NON REACTIVE  NON REACTIVE  HEPATITIS C ANTIBODY     Status: None   Collection Time    08/05/13 12:10 PM      Result Value Ref Range   HCV Ab NEGATIVE  NEGATIVE     RADIOLOGY RESULTS: See E-Chart or I-Site for most recent results.  Images and reports are reviewed.  Mr Abdomen W Wo Contrast  07/10/2013   CLINICAL DATA Left-sided abdominal pain for the past 5 months. Abnormal CT scan  demonstrating potential liver mass.  EXAM MRI ABDOMEN WITHOUT AND WITH CONTRAST  TECHNIQUE Multiplanar multisequence MR imaging of the abdomen was performed both before and after the administration of intravenous contrast.  CONTRAST 76m MULTIHANCE GADOBENATE DIMEGLUMINE 529 MG/ML IV SOLN  COMPARISON CT of the abdomen and pelvis 06/26/2013.  FINDINGS There are multiple hepatic lesions. These lesions are predominantly low T1 signal intensity, heterogeneous in signal intensity on T2 weighted images, and demonstrate avid heterogeneous enhancement on post gadolinium images, highly concerning for potential multifocal hepatocellular carcinoma. The largest of these  lesions is centered in segments 4B and 5 of the liver measuring up to 6.0 x 8.7 x 6.0 cm (image 49 of series 5007 and image 58 of series 25. Several other lesions are seen scattered throughout the hepatic parenchyma as well, predominantly in the left lobe of the liver. The unaffected hepatic parenchyma is also remarkable for mild diffuse loss of signal intensity on out of phase dual echo images, compatible with hepatic steatosis. No intrahepatic biliary ductal dilatation. No significant hepatoduodenal or portacaval lymphadenopathy at this time.  Gallbladder is unremarkable in appearance. Common bile duct is not dilated. The appearance of the pancreas, spleen, bilateral adrenal glands and bilateral kidneys is unremarkable.  IMPRESSION 1. Multifocal hepatic masses and nodules highly concerning for multifocal hepatocellular carcinoma, as discussed above. 2. Although there are no overt cirrhotic changes in the liver identified at this time, there is a background of hepatic steatosis.  SIGNATURE  Electronically Signed   By: DVinnie LangtonM.D.   On: 07/10/2013 11:19   Dg Abd 2 Views  07/11/2013   CLINICAL DATA Abdominal pain with nausea and vomiting  EXAM ABDOMEN - 2 VIEW  COMPARISON 06/05/2013  FINDINGS The bowel gas pattern is normal. There is no evidence of free air. No radio-opaque calculi or other significant radiographic abnormality is seen.  IMPRESSION Negative.  SIGNATURE  Electronically Signed   By: CFranchot GalloM.D.   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 She is also not a transplant candidate based on the number of lesions. If her AFP is elevated then she will likely not need a biopsy. If however, her AFP is normal, she will need a biopsy. I will go ahead and refer her to interventional radiology to assess her appropriateness for  percutaneous techniques. I will also refer to oncology for consideration of other therapies. I will see her back on an as-needed basis.  Also call she is looking to have hepatitis serologies drawn or recent coagulation studies. Is a possibility of a biopsy, I will go ahead and order correct. I will also order the other serologies. She does not have evidence of alcoholic cirrhosis. She likely has a history of NASH that was undiagnosed.  40 min were spent in evaluation, examination, counseling, and coordination of care.       FMilus HeightMD Surgical Oncology, General and [facility]      Visit Diagnoses: 1. [place](hepatocellular carcinoma)     Primary Care Physician: MRedge Gainer MD

## 2013-08-08 ENCOUNTER — Telehealth (INDEPENDENT_AMBULATORY_CARE_PROVIDER_SITE_OTHER): Payer: Self-pay

## 2013-08-08 ENCOUNTER — Other Ambulatory Visit (INDEPENDENT_AMBULATORY_CARE_PROVIDER_SITE_OTHER): Payer: Self-pay | Admitting: General Surgery

## 2013-08-08 DIAGNOSIS — R160 Hepatomegaly, not elsewhere classified: Secondary | ICD-10-CM

## 2013-08-08 NOTE — Telephone Encounter (Signed)
Contacted the patient to schedule biopsy.  She was confused and said that Dr. Barry Dienes told her she did not recommend a biopsy.  I read the last office note to the patient and explained that since her AFP was normal a biopsy is recommended, but she had no recollection of those details.  Please call her before any procedure is scheduled.

## 2013-08-09 LAB — CMP14+EGFR
ALK PHOS: 60 IU/L (ref 39–117)
ALT: 19 IU/L (ref 0–32)
AST: 19 IU/L (ref 0–40)
Albumin/Globulin Ratio: 1.9 (ref 1.1–2.5)
Albumin: 4.9 g/dL — ABNORMAL HIGH (ref 3.6–4.8)
BUN / CREAT RATIO: 12 (ref 11–26)
BUN: 12 mg/dL (ref 8–27)
CHLORIDE: 92 mmol/L — AB (ref 97–108)
CO2: 27 mmol/L (ref 18–29)
Calcium: 10.8 mg/dL — ABNORMAL HIGH (ref 8.7–10.3)
Creatinine, Ser: 1 mg/dL (ref 0.57–1.00)
GFR calc Af Amer: 69 mL/min/{1.73_m2} (ref >59)
GFR, EST NON AFRICAN AMERICAN: 60 mL/min/{1.73_m2} (ref >59)
Globulin, Total: 2.6 g/dL (ref 1.5–4.5)
Glucose: 95 mg/dL (ref 65–99)
POTASSIUM: 4.7 mmol/L (ref 3.5–5.2)
Sodium: 136 mmol/L (ref 134–144)
Total Bilirubin: 0.4 mg/dL (ref 0.0–1.2)
Total Protein: 7.5 g/dL (ref 6.0–8.5)

## 2013-08-09 LAB — NMR, LIPOPROFILE
CHOLESTEROL: 264 mg/dL — AB (ref <200)
HDL Cholesterol by NMR: 49 mg/dL (ref >=40)
HDL Particle Number: 20.9 umol/L — ABNORMAL LOW (ref >=30.5)
LDL PARTICLE NUMBER: 2342 nmol/L — AB (ref <1000)
LDL SIZE: 20.9 nm (ref >20.5)
LDLC SERPL CALC-MCNC: 176 mg/dL — ABNORMAL HIGH (ref <100)
LP-IR SCORE: 39 (ref <=45)
Small LDL Particle Number: 910 nmol/L — ABNORMAL HIGH (ref <=527)
TRIGLYCERIDES BY NMR: 194 mg/dL — AB (ref <150)

## 2013-08-09 LAB — THYROID PANEL WITH TSH
Free Thyroxine Index: 2.9 (ref 1.2–4.9)
T3 UPTAKE RATIO: 30 % (ref 24–39)
T4, Total: 9.5 ug/dL (ref 4.5–12.0)
TSH: 1.77 u[IU]/mL (ref 0.450–4.500)

## 2013-08-12 ENCOUNTER — Ambulatory Visit
Admission: RE | Admit: 2013-08-12 | Discharge: 2013-08-12 | Disposition: A | Discharge status: Home / Self Care | Payer: 59 | Source: Ambulatory Visit | Attending: General Surgery | Admitting: General Surgery

## 2013-08-12 DIAGNOSIS — C220 Liver cell carcinoma: Secondary | ICD-10-CM

## 2013-08-12 MED ORDER — IOHEXOL 300 MG/ML  SOLN
75.0000 mL | Freq: Once | INTRAMUSCULAR | Status: AC | PRN
  Administered 2013-08-12: 75 mL via INTRAVENOUS

## 2013-08-13 ENCOUNTER — Other Ambulatory Visit: Payer: Self-pay | Admitting: Radiology

## 2013-08-13 ENCOUNTER — Encounter (HOSPITAL_COMMUNITY): Payer: Self-pay | Admitting: Pharmacy Technician

## 2013-08-13 NOTE — Progress Notes (Signed)
Quick Note:  Please let pt know chest CT is OK ______

## 2013-08-14 ENCOUNTER — Encounter (HOSPITAL_COMMUNITY): Payer: Self-pay

## 2013-08-14 ENCOUNTER — Ambulatory Visit (HOSPITAL_COMMUNITY)
Admission: RE | Admit: 2013-08-14 | Discharge: 2013-08-14 | Disposition: A | Discharge status: Home / Self Care | Payer: 59 | Source: Ambulatory Visit | Attending: General Surgery | Admitting: General Surgery

## 2013-08-14 DIAGNOSIS — R160 Hepatomegaly, not elsewhere classified: Secondary | ICD-10-CM

## 2013-08-14 DIAGNOSIS — K7689 Other specified diseases of liver: Secondary | ICD-10-CM | POA: Insufficient documentation

## 2013-08-14 LAB — CBC
HCT: 37.9 % (ref 36.0–46.0)
Hemoglobin: 13 g/dL (ref 12.0–15.0)
MCH: 32.3 pg (ref 26.0–34.0)
MCHC: 34.3 g/dL (ref 30.0–36.0)
MCV: 94.3 fL (ref 78.0–100.0)
PLATELETS: 284 10*3/uL (ref 150–400)
RBC: 4.02 MIL/uL (ref 3.87–5.11)
RDW: 14.1 % (ref 11.5–15.5)
WBC: 7.4 10*3/uL (ref 4.0–10.5)

## 2013-08-14 LAB — APTT: aPTT: 29 seconds (ref 24–37)

## 2013-08-14 LAB — PROTIME-INR
INR: 1.08 (ref 0.00–1.49)
PROTHROMBIN TIME: 13.8 s (ref 11.6–15.2)

## 2013-08-14 MED ORDER — FENTANYL CITRATE 0.05 MG/ML IJ SOLN
INTRAMUSCULAR | Status: AC
  Filled 2013-08-14: qty 4

## 2013-08-14 MED ORDER — FENTANYL CITRATE 0.05 MG/ML IJ SOLN
INTRAMUSCULAR | Status: DC | PRN
  Administered 2013-08-14 (×2): 50 ug via INTRAVENOUS

## 2013-08-14 MED ORDER — SODIUM CHLORIDE 0.9 % IV SOLN: INTRAVENOUS | Status: DC

## 2013-08-14 MED ORDER — MIDAZOLAM HCL 2 MG/2ML IJ SOLN
INTRAMUSCULAR | Status: AC
  Filled 2013-08-14: qty 4

## 2013-08-14 MED ORDER — MIDAZOLAM HCL 2 MG/2ML IJ SOLN
INTRAMUSCULAR | Status: DC | PRN
  Administered 2013-08-14: 2 mg via INTRAVENOUS
  Administered 2013-08-14 (×2): 1 mg via INTRAVENOUS

## 2013-08-14 MED ORDER — OXYCODONE HCL 5 MG PO TABS: 5.0000 mg | ORAL_TABLET | ORAL | Status: DC | PRN

## 2013-08-14 NOTE — H&P (Signed)
Chief Complaint: "I'm here for a liver biopsy" Referring Physician:Byerly HPI: Lindsay Aguirre is an 63 y.o. female with findings of multifocal liver mass concerning for [facility]. She is referred for US guided biopsy to obtain tissue diagnosis. PMHx and meds reviewed.   Past Medical History:  Past Medical History  Diagnosis Date  . PONV (postoperative nausea and vomiting)   . Hypertension   . Hypothyroidism   . Arthritis   . Gout   . Neuromuscular disorder   . Hyperlipidemia   . Family history of anesthesia complication     '" MY OLDEST [place] ME "    Past Surgical History:  Past Surgical History  Procedure Laterality Date  . Cervical fusion  '99 & '03  . Rotator cuff surgery  '08    right  . Cataract extraction w/phaco  12/13/2010    Procedure: CATARACT EXTRACTION PHACO AND INTRAOCULAR LENS PLACEMENT (IOC);  Surgeon: Tonny Branch;  Location: [facility];  Service: Ophthalmology;  Laterality: Right;  CDE: 8.94  . Eye surgery    . Back surgery      2009  . Back surgery  2011    lumbar surgery  . Tubal ligation  1979  . Rotator cuff repair Left 04/09/2013    DR Marlou Sa  . Shoulder arthroscopy with subacromial decompression and open rotator c Left 04/09/2013    Procedure: SHOULDER ARTHROSCOPY WITH SUBACROMIAL DECOMPRESSION AND MINI OPEN ROTATOR CUFF REPAIR, POSSIBLE BICEP TENDONODESIS,  ;  Surgeon: Meredith Pel, MD;  Location: [place];  Service: Orthopedics;  Laterality: Left;    Family History:  Family History  Problem Relation Age of Onset  . Anesthesia problems Neg Hx   . Hypotension Neg Hx   . Malignant hyperthermia Neg Hx   . Pseudochol deficiency Neg Hx   . Diabetes Sister   . Stroke Mother   . Heart disease Father   . Stroke Father   . Diabetes Brother   . Diabetes Sister   . Diabetes Sister   . Diabetes Sister     Social History:  reports that she has never smoked. She has never used smokeless tobacco. She reports that she does not drink alcohol or  use illicit drugs.  Allergies:  Allergies  Allergen Reactions  . Livalo [Pitavastatin]   . Simvastatin     Medications:   Medication List    ASK your doctor about these medications       allopurinol 300 MG tablet  Commonly known as:  ZYLOPRIM  Take 1 tablet (300 mg total) by mouth daily.     bifidobacterium infantis capsule  Take 1 capsule by mouth daily.     CALCIUM + D PO  Take 1 tablet by mouth daily.     COLACE PO  Take 3 capsules by mouth daily.     HYDROcodone-acetaminophen 5-325 MG per tablet  Commonly known as:  NORCO/VICODIN  Take 1 tablet by mouth every 6 (six) hours as needed for moderate pain.     lisinopril-hydrochlorothiazide 20-12.5 MG per tablet  Commonly known as:  PRINZIDE,ZESTORETIC  Take 1 tablet by mouth daily.     SYNTHROID 88 MCG tablet  Generic drug:  levothyroxine  Take 1 tablet (88 mcg total) by mouth daily before breakfast.     vitamin C 500 MG tablet  Commonly known as:  ASCORBIC ACID  Take 500 mg by mouth daily.        Please HPI for pertinent positives, otherwise complete  10 system ROS negative.  Physical Exam: BP 128/60  Pulse 77  Temp(Src) 98.1 F (36.7 C) (Oral)  Resp 18  Ht 5\' 5"  (1.651 m)  Wt 183 lb (83.008 kg)  BMI 30.45 kg/m2  SpO2 20% Body mass index is 30.45 kg/(m^2).   General Appearance:  Alert, cooperative, no distress, appears stated age  Head:  Normocephalic, without obvious abnormality, atraumatic  ENT: Unremarkable  Neck: Supple, symmetrical, trachea midline  Lungs:   Clear to auscultation bilaterally, no w/r/r, respirations unlabored without use of accessory muscles.  Chest Wall:  No tenderness or deformity  Heart:  Regular rate and rhythm, S1, S2 normal, no murmur, rub or gallop.  Abdomen:   Soft, non-tender, non distended.  Neurologic: Normal affect, no gross deficits.   Results for orders placed during the hospital encounter of 08/14/13 (from the past 48 hour(s))  APTT     Status: None    Collection Time    08/14/13 12:32 PM      Result Value Ref Range   aPTT 29  24 - 37 seconds  CBC     Status: None   Collection Time    08/14/13 12:32 PM      Result Value Ref Range   WBC 7.4  4.0 - 10.5 K/uL   RBC 4.02  3.87 - 5.11 MIL/uL   Hemoglobin 13.0  12.0 - 15.0 g/dL   HCT 37.9  36.0 - 46.0 %   MCV 94.3  78.0 - 100.0 fL   MCH 32.3  26.0 - 34.0 pg   MCHC 34.3  30.0 - 36.0 g/dL   RDW 14.1  11.5 - 15.5 %   Platelets 284  150 - 400 K/uL  PROTIME-INR     Status: None   Collection Time    08/14/13 12:32 PM      Result Value Ref Range   Prothrombin Time 13.8  11.6 - 15.2 seconds   INR 1.08  0.00 - 1.49   No results found.  Assessment/Plan Multifocal liver lesion suspicious for hepatocellular carcinoma. Discussed procedure, risks, complications, use of sedation. Labs reviewed, ok Consent signed in chart  Ascencion Dike PA-C 08/14/2013, 1:15 PM

## 2013-08-14 NOTE — Discharge Instructions (Signed)
Liver Biopsy  A liver biopsy is done to confirm or prove a suspected problem. The liver is a large organ in the upper right hand of your abdomen. To do the test, the doctor puts a small needle into the right side of your abdomen. A tiny piece of liver tissue is taken and sent for testing. This should not be painful as the skin is injected with a local anesthetic that numbs the area.   HOW A BIOPSY IS PERFORMED  This is often performed as a same day surgery. This can be done in a hospital or clinic. Biopsies are often done under local anesthesia which makes the area of biopsy numb. Sometimes sedation is given to help patients relax.  If you are taking blood thinning medications or medications containing aspirin, this must be discussed with your caregiver before the test. This medication may need to be stopped for up to 7 days before the procedure, or the dose may need to be changed. You should review all of your other medications with your caregiver before the test.  You must remain in bed for 1 to 2 hours after the test. Having something to read may help pass the time.   LET YOUR CAREGIVERS KNOW ABOUT THE FOLLOWING:  · Allergies.  · Medications taken including herbs, eye drops, over -the- counter medications, and creams.  · Use of steroids (by mouth or creams).  · Previous problems with anesthetics or novocaine.  · Possibility of pregnancy, if this applies.  · History of blood clots (thrombophlebitis).  · History of bleeding or blood problems.  · Previous surgery.  · Other health problems.  BEFORE THE PROCEDURE  You should be present 60 minutes prior to your procedure or as directed. Check in at the admissions desk for filling out necessary forms if not pre-registered. There will be consent forms to sign prior to the procedure. There is a waiting area for your family while you are having your biopsy.  AFTER THE PROCEDURE  · After your biopsy, you will be taken to the recovery area where a nurse will watch and check  your progress.  · You may have to lie on your right side for 1 to 2 hours.  · Your blood pressure and pulse will be checked often.  · If you are having pain or feel sick, tell your nurse.  · After 1 to 2 hours, if you are going home, you may sit in a chair and get dressed. The nurse will let you know when you can get up.  · Once you are doing well, barring other problems, you will be allowed to go home. Once at home, putting an ice pack on your operative site may help with discomfort and keep swelling down.  · You may resume a normal diet and activities as directed.  · Change dressings as directed.  · Only take over-the-counter or prescription medicines for pain, discomfort, or fever as directed by your caregiver.  · Call for your results as instructed by your caregiver. Remember it is your job to be sure you get the results of your biopsy and any additional tests performed on the sample taken. Do not assume everything is fine if you do not hear from your caregiver.  HOME CARE INSTRUCTIONS   · You should rest for one to two days or as instructed.  · You will need to have a responsible adult take you home and stay with you overnight.  · Do not lift over   5 lbs. or play contact sports for two weeks.  · Do not drive for 24 hours.  · Do not take medication containing aspirin or drink alcohol for one week after this test.  SEEK MEDICAL CARE IF:   · There is increased bleeding (more than a small spot) from the biopsy site.  · You have redness, swelling, or increasing pain in the biopsy site.  · You develop swelling or pain in the abdomen.  · You have an unexplained oral temperature over 102° F (38.9° C).  · You notice a foul smell coming from the wound or dressing.  SEEK IMMEDIATE MEDICAL CARE IF:   · You develop a rash.  · You have difficulty breathing.  · You have allergic problems such as itching or swelling or shortness of breath.  Document Released: 07/09/2003 Document Revised: 07/11/2011 Document Reviewed:  11/27/2007  ExitCare® Patient Information ©2014 [employer].

## 2013-08-14 NOTE — Procedures (Signed)
US guided core biopsy of left hepatic lobe lesion.  Three cores performed and one adequate sample obtained.  No immediate complication.

## 2013-08-15 ENCOUNTER — Telehealth (INDEPENDENT_AMBULATORY_CARE_PROVIDER_SITE_OTHER): Payer: Self-pay

## 2013-08-15 NOTE — Telephone Encounter (Signed)
Pt made aware her chest CT was normal.

## 2013-08-16 ENCOUNTER — Ambulatory Visit (HOSPITAL_BASED_OUTPATIENT_CLINIC_OR_DEPARTMENT_OTHER): Payer: 59 | Admitting: Oncology

## 2013-08-16 ENCOUNTER — Telehealth: Payer: Self-pay | Admitting: Oncology

## 2013-08-16 ENCOUNTER — Ambulatory Visit: Payer: 59

## 2013-08-16 ENCOUNTER — Other Ambulatory Visit: Payer: 59

## 2013-08-16 ENCOUNTER — Encounter: Payer: Self-pay | Admitting: Oncology

## 2013-08-16 VITALS — BP 140/72 | HR 84 | Temp 97.8°F | Resp 20 | Ht 65.0 in | Wt 183.9 lb

## 2013-08-16 DIAGNOSIS — C229 Malignant neoplasm of liver, not specified as primary or secondary: Secondary | ICD-10-CM

## 2013-08-16 NOTE — Progress Notes (Signed)
Please see consult note.  

## 2013-08-16 NOTE — Telephone Encounter (Signed)
Gave appt Md only  on April 2015

## 2013-08-16 NOTE — Consult Note (Signed)
Reason for Referral: Liver masses.   HPI: 63 year old pleasant woman native of [place] where she had lived the majority of her life. She is currently retired and have been so for a while. She has a past medical history significant for hypertension and hypothyroidism but for the most part have been in reasonable health. She presented with abdominal pain. The pain is primarily across the upper abdomen, but can be in her back as well. She has had some weight loss, but this was intentional. She was evaluated by her primary care provider and was referred to have a CT scan of the abdomen and pelvis. This was done in February of 2015 which showed a large mass in the inferior medial left hepatic lobe measuring 7.4 x 5.5 cm. in MRI was recommended to evaluate this lesion and this was done on 07/10/2013. This showed the multifocal hepatic metastasis highly suspicious for hepatocellular carcinoma. Patient was referred to Dr. Barry Dienes who evaluated the patient then had a CT scan of the chest that was unremarkable. She underwent a biopsy that was completed on 08/14/2013 on the results are currently pending. She was referred to me for further evaluation. Clinically, she is doing relatively well. She denies any history of GI bleed. She has not ever had any blood transfusion other than an autotransfusion during spine surgery. She has never had any intravenous drug use. She has not reported any constitutional symptoms weight loss or appetite changes. She continues to perform activities of daily living without any decline.   Past Medical History  Diagnosis Date  . PONV (postoperative nausea and vomiting)   . Hypertension   . Hypothyroidism   . Arthritis   . Gout   . Neuromuscular disorder   . Hyperlipidemia   . Family history of anesthesia complication     '" MY OLDEST [place] ME "  :  Past Surgical History  Procedure Laterality Date  . Cervical fusion  '99 & '03  . Rotator cuff  surgery  '08    right  . Cataract extraction w/phaco  12/13/2010    Procedure: CATARACT EXTRACTION PHACO AND INTRAOCULAR LENS PLACEMENT (IOC);  Surgeon: Tonny Branch;  Location: [facility];  Service: Ophthalmology;  Laterality: Right;  CDE: 8.94  . Eye surgery    . Back surgery      2009  . Back surgery  2011    lumbar surgery  . Tubal ligation  1979  . Rotator cuff repair Left 04/09/2013    DR Marlou Sa  . Shoulder arthroscopy with subacromial decompression and open rotator c Left 04/09/2013    Procedure: SHOULDER ARTHROSCOPY WITH SUBACROMIAL DECOMPRESSION AND MINI OPEN ROTATOR CUFF REPAIR, POSSIBLE BICEP TENDONODESIS,  ;  Surgeon: Meredith Pel, MD;  Location: [facility];  Service: Orthopedics;  Laterality: Left;  :  Current Outpatient Prescriptions  Medication Sig Dispense Refill  . allopurinol (ZYLOPRIM) 300 MG tablet Take 1 tablet (300 mg total) by mouth daily.  30 tablet  5  . bifidobacterium infantis (ALIGN) capsule Take 1 capsule by mouth daily.      . Calcium Carbonate-Vitamin D (CALCIUM + D PO) Take 1 tablet by mouth daily.      [name] Sodium (COLACE PO) Take 3 capsules by mouth daily.      [name] HYDROcodone-acetaminophen (NORCO/VICODIN) 5-325 MG per tablet Take 1 tablet by mouth every 6 (six) hours as needed for moderate pain.  10 tablet  0  . lisinopril-hydrochlorothiazide (PRINZIDE,ZESTORETIC) 20-12.5 MG  per tablet Take 1 tablet by mouth daily.  30 tablet  5  . SYNTHROID 88 MCG tablet Take 1 tablet (88 mcg total) by mouth daily before breakfast.  30 tablet  5  . vitamin C (ASCORBIC ACID) 500 MG tablet Take 500 mg by mouth daily.       No current facility-administered medications for this visit.       Allergies  Allergen Reactions  . Livalo [Pitavastatin]   . Simvastatin   :  Family History  Problem Relation Age of Onset  . Anesthesia problems Neg Hx   . Hypotension Neg Hx   . Malignant hyperthermia Neg Hx   . Pseudochol deficiency Neg Hx   . Diabetes Sister   . Stroke  Mother   . Heart disease Father   . Stroke Father   . Diabetes Brother   . Diabetes Sister   . Diabetes Sister   . Diabetes Sister   :  History   Social History  . Marital Status: Divorced    Spouse Name: N/A    Number of Children: N/A  . Years of Education: N/A   Occupational History  . Not on file.   Social History Main Topics  . Smoking status: Never Smoker   . Smokeless tobacco: Never Used  . Alcohol Use: No  . Drug Use: No  . Sexual Activity: Not on file   Other Topics Concern  . Not on file   Social History Narrative  . No narrative on file  :  Constitutional: negative for chills, fatigue and fevers Eyes: negative for icterus, irritation and redness Ears, nose, mouth, throat, and face: negative for epistaxis, hearing loss, hoarseness and nasal congestion Respiratory: negative for cough, hemoptysis and sputum Cardiovascular: negative for chest pain, irregular heart beat and syncope Gastrointestinal: negative for abdominal pain, diarrhea and melena Genitourinary:negative for dysuria, frequency and hematuria Integument/breast: negative for pruritus, rash and skin color change Hematologic/lymphatic: negative for bleeding, easy bruising and lymphadenopathy Musculoskeletal:negative for arthralgias, back pain and muscle weakness Neurological: negative for dizziness and gait problems Behavioral/Psych: negative for anxiety and depression Endocrine: negative for temperature intolerance Allergic/Immunologic: negative for urticaria  Exam: ECOG 0 Blood pressure 140/72, pulse 84, temperature 97.8 F (36.6 C), temperature source Oral, resp. rate 20, height 5\' 5"  (1.651 m), weight 183 lb 14.4 oz (83.416 kg), SpO2 100.00%. General appearance: alert and cooperative Head: Normocephalic, without obvious abnormality, atraumatic Throat: lips, mucosa, and tongue normal; teeth and gums normal Neck: no adenopathy, no carotid bruit, no JVD, supple, symmetrical, trachea midline and  thyroid not enlarged, symmetric, no tenderness/mass/nodules Back: symmetric, no curvature. ROM normal. No CVA tenderness. Resp: clear to auscultation bilaterally Chest wall: no tenderness Cardio: regular rate and rhythm, S1, S2 normal, no murmur, click, rub or gallop GI: soft, non-tender; bowel sounds normal; no masses,  no organomegaly Extremities: extremities normal, atraumatic, no cyanosis or edema Pulses: 2+ and symmetric Skin: Skin color, texture, turgor normal. No rashes or lesions Lymph nodes: Cervical, supraclavicular, and axillary nodes normal. Neurologic: Grossly normal   Recent Labs  08/14/13 1232  WBC 7.4  HGB 13.0  HCT 37.9  PLT 284   Results for CHARVI, GAMMAGE (MRN [id number]) as of 08/16/2013 10:51  Ref. Range 08/05/2013 12:10  AFP-Tumor Marker Latest Range: 0.0-8.0 ng/mL 2.6    Ct Chest W Contrast  08/12/2013   CLINICAL DATA:  Recently diagnosed with hepatocellular carcinoma. Evaluate for metastasis. Nonsmoker.  EXAM: CT CHEST WITH CONTRAST  TECHNIQUE: Multidetector CT imaging  of the chest was performed during intravenous contrast administration.  CONTRAST:  8mL OMNIPAQUE IOHEXOL 300 MG/ML  SOLN  COMPARISON:  DG CHEST 2 VIEW dated 04/03/2013; CT ABD/PELV WO CM dated 06/26/2013; MR ABDOMEN WO/W CM dated 07/10/2013; MR L SPINE WO/W CM dated 03/29/2011  FINDINGS: Lungs/Pleura: 3 mm right middle lobe lung nodule on image 30/series 3.  3 mm right lower lobe lung nodule on image 28.  Subpleural 2 mm left upper lobe nodule on image 16.  A left lower lobe 6 mm nodule on image 41 which is subpleural in location.  No pleural fluid.  Heart/Mediastinum: Left supraclavicular node which measures 6 mm, not pathologic by size criteria. Aortic and coronary artery atherosclerosis. Coronary atherosclerosis including in the LAD and right. Possible distal left main disease.  Normal heart size with trace, likely physiologic pleural fluid. No central pulmonary embolism, on this non-dedicated study.  No mediastinal or hilar adenopathy.  Upper Abdomen: Multiple hepatic lesions, as better evaluated on recent MRI. Normal imaged portions of adrenal glands.  Bones/Musculoskeletal:  No acute osseous abnormality.  IMPRESSION: 1. Scattered pulmonary nodules, measuring maximally 6 mm. Primarily subpleural in location. Favor subpleural lymph nodes. This can serve as a baseline for follow-up to exclude less likely early pulmonary metastasis. 2. Hepatic lesions which were better evaluated on prior dedicated exams. 3. Age advanced coronary artery atherosclerosis. Recommend assessment of coronary risk factors and consideration of medical therapy.   Electronically Signed   By: Abigail Miyamoto M.D.   On: 08/12/2013 13:08     Assessment and Plan:    63 year old woman with a recent finding of multifocal hepatic lesions suspicious for malignancy. She had a CT scan which showed a large tumor in the left hepatic lobe that measures 7.5 x 5.5 cm. This was confirmed by an MRI indicating multifocal lesions. She underwent a biopsy on 08/14/2013 but the final results are currently pending. I have discussed this case with Dr. Barry Dienes as well as the reviewing pathologists at this time. Although the final pathology is not out yet, the findings are definitive for a carcinoma. The differential diagnosis at this time includes hepatocellular carcinoma, intrahepatic cholangiocarcinoma, metastatic carcinoma from other primaries. Imaging studies failed to show a clear-cut primary outside of her liver. These findings are communicated to the patient today and I've explained to her that regardless of the etiology of her tumor we are dealing with anything chewable malignancy.  Options of treatments were discussed today and they all of her palliative purposes. I doubt there is a curative intervention at this point regardless of the actual etiology. If we are dealing with hepatocellular carcinoma, one can consider chemoembolization but systemic  therapy would be also a consideration. Saline for daily with cholangiocarcinoma or metastatic carcinoma then systemic chemotherapy would be the mainstay of treatment.  I've asked the patient to return to me after the results of the final pathology been completed also after discussion of this case in a multi-disciplinary gastrointestinal tumor board.  All her questions are answered today to her satisfaction.

## 2013-08-27 ENCOUNTER — Ambulatory Visit (HOSPITAL_BASED_OUTPATIENT_CLINIC_OR_DEPARTMENT_OTHER): Payer: 59 | Admitting: Oncology

## 2013-08-27 ENCOUNTER — Telehealth: Payer: Self-pay | Admitting: Oncology

## 2013-08-27 ENCOUNTER — Encounter: Payer: Self-pay | Admitting: Oncology

## 2013-08-27 ENCOUNTER — Other Ambulatory Visit: Payer: Self-pay | Admitting: Medical Oncology

## 2013-08-27 ENCOUNTER — Telehealth: Payer: Self-pay | Admitting: *Deleted

## 2013-08-27 VITALS — BP 118/72 | HR 84 | Temp 98.6°F | Resp 18 | Ht 65.0 in | Wt 181.0 lb

## 2013-08-27 DIAGNOSIS — C787 Secondary malignant neoplasm of liver and intrahepatic bile duct: Secondary | ICD-10-CM

## 2013-08-27 DIAGNOSIS — C801 Malignant (primary) neoplasm, unspecified: Secondary | ICD-10-CM

## 2013-08-27 DIAGNOSIS — C240 Malignant neoplasm of extrahepatic bile duct: Secondary | ICD-10-CM

## 2013-08-27 MED ORDER — LIDOCAINE-PRILOCAINE 2.5-2.5 % EX CREA: 1.0000 "application " | TOPICAL_CREAM | CUTANEOUS | Status: AC | PRN

## 2013-08-27 MED ORDER — ONDANSETRON HCL 8 MG PO TABS: 8.0000 mg | ORAL_TABLET | Freq: Three times a day (TID) | ORAL | Status: DC | PRN

## 2013-08-27 NOTE — Telephone Encounter (Signed)
Per staff message and POF I have scheduled appts.  JMW  

## 2013-08-27 NOTE — Telephone Encounter (Signed)
Gave pt appt for labs,md and chemo class, emailed Sharyn Lull regarding chemo appts

## 2013-08-27 NOTE — Progress Notes (Signed)
Hematology and Oncology Follow Up Visit  Lindsay Aguirre [id number] 02-Aug-1950 63 y.o. 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 This was confirmed by biopsy on 08/14/2013 and imaging studies showed multifocal hepatic lesions.   Prior Therapy: Status post a liver biopsy on 08/14/2013.  Current therapy: Under evaluation for systemic chemotherapy.  Interim History:  This is a pleasant 63 year old woman am seeing in consultation for the second time after my initial evaluation on 08/16/2013. Since her last visit, she is completely asymptomatic. She has not reported any abdominal pain or discomfort. Has not reported any nausea or vomiting. Has not reported any hematochezia or melena. Has not reported any decline in her energy level or performance status. She continues to perform his activities of daily living without any decline.  Medications: I have reviewed the patient's current medications.  Current Outpatient Prescriptions  Medication Sig Dispense Refill  . allopurinol (ZYLOPRIM) 300 MG tablet Take 1 tablet (300 mg total) by mouth daily.  30 tablet  5  . bifidobacterium infantis (ALIGN) capsule Take 1 capsule by mouth daily.      . Calcium Carbonate-Vitamin D (CALCIUM + D PO) Take 1 tablet by mouth daily.      [name] Sodium (COLACE PO) Take 3 capsules by mouth daily.      [name] HYDROcodone-acetaminophen (NORCO/VICODIN) 5-325 MG per tablet Take 1 tablet by mouth every 6 (six) hours as needed for moderate pain.  10 tablet  0  . lidocaine-prilocaine (EMLA) cream Apply 1 application topically as needed. Apply cream, on days of treatment, to [facility] site 1-2 hours prior to treatment.  30 g  0  . lisinopril-hydrochlorothiazide (PRINZIDE,ZESTORETIC) 20-12.5 MG per tablet Take 1 tablet by mouth daily.  30 tablet  5  . ondansetron (ZOFRAN) 8 MG tablet Take  1 tablet (8 mg total) by mouth every 8 (eight) hours as needed for nausea or vomiting.  30 tablet  0  . SYNTHROID 88 MCG tablet Take 1 tablet (88 mcg total) by mouth daily before breakfast.  30 tablet  5  . vitamin C (ASCORBIC ACID) 500 MG tablet Take 500 mg by mouth daily.       No current facility-administered medications for this visit.     Allergies:  Allergies  Allergen Reactions  . Livalo [Pitavastatin]   . Simvastatin     Past Medical History, Surgical history, Social history, and Family History were reviewed and updated.  Review of Systems: Constitutional:  Negative for fever, chills, night sweats, anorexia, weight loss, pain. Cardiovascular: no chest pain or dyspnea on exertion Respiratory: no cough, shortness of breath, or wheezing Neurological: negative for - dizziness, gait disturbance or headaches Dermatological: negative for acne, pruritus and rash ENT: negative for - epistaxis, headaches or hearing change Skin: Negative. Gastrointestinal: no abdominal pain, change in bowel habits, or black or bloody stools Genito-Urinary: no dysuria, trouble voiding, or hematuria Hematological and Lymphatic: negative for - bleeding problems, blood transfusions or bruising Breast: negative Musculoskeletal: negative for - gait disturbance, muscle pain or muscular weakness Remaining ROS negative. Physical Exam: Blood pressure 118/72, pulse 84, temperature 98.6 F (37 C), temperature source Oral, resp. rate 18, height 5\' 5"  (1.651 m), weight 181 lb (82.101 kg). ECOG: 0 General appearance: alert and cooperative Head: Normocephalic, without obvious abnormality, atraumatic Neck: no adenopathy, no carotid bruit, no JVD, supple, symmetrical, trachea midline and thyroid  not enlarged, symmetric, no tenderness/mass/nodules Lymph nodes: Cervical, supraclavicular, and axillary nodes normal. Heart:regular rate and rhythm, S1, S2 normal, no murmur, click, rub or gallop Lung:chest clear, no  wheezing, rales, normal symmetric air entry Abdomin: soft, non-tender, without masses or organomegaly EXT:no erythema, induration, or nodules   Lab Results: Lab Results  Component Value Date   WBC 7.4 08/14/2013   HGB 13.0 08/14/2013   HCT 37.9 08/14/2013   MCV 94.3 08/14/2013   PLT 284 08/14/2013     Chemistry      Component Value Date/Time   NA 136 08/07/2013 1430   NA 126* 07/11/2013 0610   K 4.7 08/07/2013 1430   CL 92* 08/07/2013 1430   CO2 27 08/07/2013 1430   BUN 12 08/07/2013 1430   BUN 11 07/11/2013 0610   CREATININE 1.00 08/07/2013 1430   CREATININE 0.81 10/24/2012 1203      Component Value Date/Time   CALCIUM 10.8* 08/07/2013 1430   ALKPHOS 60 08/07/2013 1430   AST 19 08/07/2013 1430   ALT 19 08/07/2013 1430   BILITOT 0.4 08/07/2013 1430       Impression and Plan:   63 year old woman with the following issues:  1. 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 The natural course of this disease was discussed today including treatment options. She understands that this disease is incurable and we are best hoping for palliative treatments. Iliopsoas palliation at this point would be systemic chemotherapy. Given the fact that she is asymptomatic from her disease, she would like to try treatment that is not worse on her actual symptoms which are none at this point. I discussed with her the role single agent gemcitabine versus combination therapy with gemcitabine and Abraxane, gemcitabine and cis-platinum were multi-agent chemotherapy such as 5-FU, oxaliplatin and CPT 11. After this and today we decided to proceed with single agent gemcitabine and possibly add different agents in the future if needed to. Complication of this drug were discussed today in detail. Complication that includes myelosuppression, infusion-related toxicity, nausea, vomiting, dermatological toxicity and febrile illness were discussed. Serious complications that includes  neutropenia, neutropenic sepsis and possible need for hospitalizations and rarely VTE  and serious illness and possibly death. Is agreeable to proceed at this point. She will attend chemotherapy education class and an informed consent was obtained.  2. IV access: Risks and benefits of Port-A-Cath insertion was discussed. She is agreeable to proceed at this time. Complications that includes thrombosis, bleeding and infection were discussed and she is agreeable.  3. Antiemetics: Zofran will be called to her pharmacy today.  Wyatt Portela, MD 4/28/20151:47 PM

## 2013-08-28 ENCOUNTER — Telehealth: Payer: Self-pay | Admitting: Oncology

## 2013-08-28 NOTE — Telephone Encounter (Signed)
Talked to pt and she will come by tomorrow and get appt calendar for MAy, lab,md and chemo

## 2013-08-29 ENCOUNTER — Other Ambulatory Visit: Payer: Self-pay | Admitting: Radiology

## 2013-08-29 ENCOUNTER — Encounter: Payer: Self-pay | Admitting: *Deleted

## 2013-08-29 ENCOUNTER — Other Ambulatory Visit: Payer: 59

## 2013-08-29 ENCOUNTER — Encounter (HOSPITAL_COMMUNITY): Payer: Self-pay | Admitting: Pharmacy Technician

## 2013-09-02 ENCOUNTER — Encounter: Payer: Self-pay | Admitting: *Deleted

## 2013-09-03 ENCOUNTER — Ambulatory Visit (HOSPITAL_COMMUNITY)
Admission: RE | Admit: 2013-09-03 | Discharge: 2013-09-03 | Disposition: A | Discharge status: Home / Self Care | Payer: 59 | Source: Ambulatory Visit | Attending: Oncology | Admitting: Oncology

## 2013-09-03 ENCOUNTER — Other Ambulatory Visit: Payer: Self-pay | Admitting: Oncology

## 2013-09-03 ENCOUNTER — Encounter (HOSPITAL_COMMUNITY): Payer: Self-pay

## 2013-09-03 DIAGNOSIS — M109 Gout, unspecified: Secondary | ICD-10-CM | POA: Insufficient documentation

## 2013-09-03 DIAGNOSIS — C787 Secondary malignant neoplasm of liver and intrahepatic bile duct: Secondary | ICD-10-CM | POA: Insufficient documentation

## 2013-09-03 DIAGNOSIS — I10 Essential (primary) hypertension: Secondary | ICD-10-CM | POA: Insufficient documentation

## 2013-09-03 DIAGNOSIS — E785 Hyperlipidemia, unspecified: Secondary | ICD-10-CM | POA: Insufficient documentation

## 2013-09-03 DIAGNOSIS — C801 Malignant (primary) neoplasm, unspecified: Secondary | ICD-10-CM | POA: Insufficient documentation

## 2013-09-03 DIAGNOSIS — C240 Malignant neoplasm of extrahepatic bile duct: Secondary | ICD-10-CM

## 2013-09-03 DIAGNOSIS — Z79899 Other long term (current) drug therapy: Secondary | ICD-10-CM | POA: Insufficient documentation

## 2013-09-03 DIAGNOSIS — E039 Hypothyroidism, unspecified: Secondary | ICD-10-CM | POA: Insufficient documentation

## 2013-09-03 DIAGNOSIS — Z981 Arthrodesis status: Secondary | ICD-10-CM | POA: Insufficient documentation

## 2013-09-03 LAB — CBC WITH DIFFERENTIAL/PLATELET
BASOS ABS: 0 10*3/uL (ref 0.0–0.1)
BASOS PCT: 1 % (ref 0–1)
EOS ABS: 0.1 10*3/uL (ref 0.0–0.7)
Eosinophils Relative: 1 % (ref 0–5)
HCT: 37.6 % (ref 36.0–46.0)
Hemoglobin: 13 g/dL (ref 12.0–15.0)
Lymphocytes Relative: 21 % (ref 12–46)
Lymphs Abs: 1.4 10*3/uL (ref 0.7–4.0)
MCH: 32.1 pg (ref 26.0–34.0)
MCHC: 34.6 g/dL (ref 30.0–36.0)
MCV: 92.8 fL (ref 78.0–100.0)
Monocytes Absolute: 0.4 10*3/uL (ref 0.1–1.0)
Monocytes Relative: 7 % (ref 3–12)
Neutro Abs: 4.7 10*3/uL (ref 1.7–7.7)
Neutrophils Relative %: 71 % (ref 43–77)
PLATELETS: 283 10*3/uL (ref 150–400)
RBC: 4.05 MIL/uL (ref 3.87–5.11)
RDW: 13.8 % (ref 11.5–15.5)
WBC: 6.6 10*3/uL (ref 4.0–10.5)

## 2013-09-03 LAB — PROTIME-INR
INR: 1.02 (ref 0.00–1.49)
Prothrombin Time: 13.2 seconds (ref 11.6–15.2)

## 2013-09-03 LAB — APTT: aPTT: 27 seconds (ref 24–37)

## 2013-09-03 MED ORDER — FENTANYL CITRATE 0.05 MG/ML IJ SOLN
INTRAMUSCULAR | Status: AC
  Filled 2013-09-03: qty 6

## 2013-09-03 MED ORDER — CEFAZOLIN SODIUM-DEXTROSE 2-3 GM-% IV SOLR
2.0000 g | INTRAVENOUS | Status: AC
Start: 2013-09-03 — End: 2013-09-03
  Administered 2013-09-03: 2 g via INTRAVENOUS
  Filled 2013-09-03: qty 50

## 2013-09-03 MED ORDER — SODIUM CHLORIDE 0.9 % IV SOLN
INTRAVENOUS | Status: DC
  Administered 2013-09-03: 10:00:00 via INTRAVENOUS

## 2013-09-03 MED ORDER — FENTANYL CITRATE 0.05 MG/ML IJ SOLN
INTRAMUSCULAR | Status: AC | PRN
  Administered 2013-09-03 (×2): 25 ug via INTRAVENOUS

## 2013-09-03 MED ORDER — MIDAZOLAM HCL 2 MG/2ML IJ SOLN
INTRAMUSCULAR | Status: AC
  Filled 2013-09-03: qty 6

## 2013-09-03 MED ORDER — HEPARIN SOD (PORK) LOCK FLUSH 100 UNIT/ML IV SOLN
INTRAVENOUS | Status: DC
Start: 2013-09-03 — End: 2013-09-04
  Filled 2013-09-03: qty 5

## 2013-09-03 MED ORDER — LIDOCAINE HCL 1 % IJ SOLN
INTRAMUSCULAR | Status: AC
  Filled 2013-09-03: qty 20

## 2013-09-03 MED ORDER — MIDAZOLAM HCL 2 MG/2ML IJ SOLN
INTRAMUSCULAR | Status: AC | PRN
  Administered 2013-09-03 (×3): 0.5 mg via INTRAVENOUS
  Administered 2013-09-03: 1 mg via INTRAVENOUS

## 2013-09-03 MED ORDER — HEPARIN SOD (PORK) LOCK FLUSH 100 UNIT/ML IV SOLN
INTRAVENOUS | Status: DC | PRN
  Administered 2013-09-03: 500 [IU]

## 2013-09-03 NOTE — H&P (Signed)
Agree.  Patient seen.  For port placement today. 

## 2013-09-03 NOTE — H&P (Signed)
Lindsay Aguirre is an 63 y.o. female.   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  Past Medical History  Diagnosis Date  . PONV (postoperative nausea and vomiting)   . Hypertension   . Hypothyroidism   . Arthritis   . Gout   . Neuromuscular disorder   . Hyperlipidemia   . Family history of anesthesia complication     '" MY OLDEST SON Stronach ME "  . 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 Cervical fusion  '99 & '03  . Rotator cuff surgery  '08    right  . Cataract extraction w/phaco  12/13/2010    Procedure: CATARACT EXTRACTION PHACO AND INTRAOCULAR LENS PLACEMENT (IOC);  Surgeon: Tonny Branch;  Location: [facility];  Service: Ophthalmology;  Laterality: Right;  CDE: 8.94  . Eye surgery    . Back surgery      2009  . Back surgery  2011    lumbar surgery  . Tubal ligation  1979  . Rotator cuff repair Left 04/09/2013    DR Marlou Sa  . Shoulder arthroscopy with subacromial decompression and open rotator c Left 04/09/2013    Procedure: SHOULDER ARTHROSCOPY WITH SUBACROMIAL DECOMPRESSION AND MINI OPEN ROTATOR CUFF REPAIR, POSSIBLE BICEP TENDONODESIS,  ;  Surgeon: Meredith Pel, MD;  Location: [facility];  Service: Orthopedics;  Laterality: Left;    Family History  Problem Relation Age of Onset  . Anesthesia problems Neg Hx   . Hypotension Neg Hx   . Malignant hyperthermia Neg Hx   . Pseudochol deficiency Neg Hx   . Diabetes Sister   . Stroke Mother   . Heart disease Father   . Stroke Father   . Diabetes Brother   . Diabetes Sister   . Diabetes Sister   . Diabetes Sister    Social History:  reports that she has never smoked. She has never used smokeless tobacco. She reports that she does not drink alcohol or use illicit drugs.  Allergies:  Allergies  Allergen Reactions   . Livalo [Pitavastatin]     Leg cramps  . Simvastatin     Leg cramps  . Statins     Leg Cramps     Current outpatient prescriptions:allopurinol (ZYLOPRIM) 300 MG tablet, Take 1 tablet (300 mg total) by mouth daily., Disp: 30 tablet, Rfl: 5;  bifidobacterium infantis (ALIGN) capsule, Take 1 capsule by mouth daily., Disp: , Rfl: ;  Cholecalciferol (VITAMIN D) 2000 UNITS tablet, Take 2,000 Units by mouth daily., Disp: , Rfl: ;  Docusate Sodium (COLACE PO), Take 3 capsules by mouth daily., Disp: , Rfl:  HYDROcodone-acetaminophen (NORCO/VICODIN) 5-325 MG per tablet, Take 1 tablet by mouth every 6 (six) hours as needed for moderate pain., Disp: 10 tablet, Rfl: 0;  levothyroxine (SYNTHROID, LEVOTHROID) 88 MCG tablet, Take 88 mcg by mouth daily before breakfast., Disp: , Rfl: ;  lidocaine-prilocaine (EMLA) cream, Apply 1 application topically as needed. Apply cream, on days of treatment, to [facility] site 1-2 hours prior to treatment., Disp: 30 g, Rfl: 0 lisinopril-hydrochlorothiazide (PRINZIDE,ZESTORETIC) 20-12.5 MG per tablet, Take 1 tablet by mouth every evening., Disp: , Rfl: ;  ondansetron (ZOFRAN) 8 MG tablet, Take 1 tablet (8 mg total) by mouth every 8 (eight) hours as needed for nausea or vomiting., Disp: 30 tablet, Rfl: 0;  vitamin C (ASCORBIC ACID) 500 MG tablet, Take 500 mg by mouth daily., Disp: , Rfl:  Current facility-administered medications:0.9 %  sodium chloride infusion, , Intravenous, Continuous, Koreen D Morgan, PA-C;  ceFAZolin (ANCEF) IVPB 2 g/50 mL premix, 2 g, Intravenous, On Call, Hedy Jacob, PA-C  09/03/13 labs pending Review of Systems  Constitutional: Negative for fever and chills.  Respiratory: Negative for cough and shortness of breath.   Cardiovascular: Negative for chest pain.  Gastrointestinal: Positive for abdominal pain. Negative for nausea, vomiting and blood in stool.  Genitourinary: Negative for hematuria.  Musculoskeletal: Positive for back pain.  Neurological:  Negative for headaches.  Endo/Heme/Allergies: Does not bruise/bleed easily.    Blood pressure 110/70, pulse 82, temperature 98.2 F (36.8 C), temperature source Oral, resp. rate 16, SpO2 95.00%. Physical Exam  Constitutional: She is oriented to person, place, and time. She appears well-developed and well-nourished.  Cardiovascular: Normal rate and regular rhythm.   Respiratory: Effort normal and breath sounds normal.  GI: Soft. Bowel sounds are normal.  Tender upper abd region  Musculoskeletal: Normal range of motion. She exhibits no edema.  Neurological: She is alert and oriented to person, place, and time.     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 Details/risks of procedure d/w pt/family with their understanding and consent.   [name] [name] 09/03/2013, 9:56 AM

## 2013-09-03 NOTE — Discharge Instructions (Signed)
Moderate Sedation, Adult °Moderate sedation is given to help you relax or even sleep through a procedure. You may remain sleepy, be clumsy, or have poor balance for several hours following this procedure. Arrange for a responsible adult, family member, or friend to take you home. A responsible adult should stay with you for at least 24 hours or until the medicines have worn off. °· Do not participate in any activities where you could become injured for the next 24 hours, or until you feel normal again. Do not: °· Drive. °· Swim. °· Ride a bicycle. °· Operate heavy machinery. °· Cook. °· Use power tools. °· Climb ladders. °· Work at heights. °· Do not make important decisions or sign legal documents until you are improved. °· Vomiting may occur if you eat too soon. When you can drink without vomiting, try water, juice, or soup. Try solid foods if you feel little or no nausea. °· Only take over-the-counter or prescription medications for pain, discomfort, or fever as directed by your caregiver.If pain medications have been prescribed for you, ask your caregiver how soon it is safe to take them. °· Make sure you and your family fully understands everything about the medication given to you. Make sure you understand what side effects may occur. °· You should not drink alcohol, take sleeping pills, or medications that cause drowsiness for at least 24 hours. °· If you smoke, do not smoke alone. °· If you are feeling better, you may resume normal activities 24 hours after receiving sedation. °· Keep all appointments as scheduled. Follow all instructions. °· Ask questions if you do not understand. °SEEK MEDICAL CARE IF:  °· Your skin is pale or bluish in color. °· You continue to feel sick to your stomach (nauseous) or throw up (vomit). °· Your pain is getting worse and not helped by medication. °· You have bleeding or swelling. °· You are still sleepy or feeling clumsy after 24 hours. °SEEK IMMEDIATE MEDICAL CARE IF:   °· You develop a rash. °· You have difficulty breathing. °· You develop any type of allergic problem. °· You have a fever. °Document Released: 01/11/2001 Document Revised: 07/11/2011 Document Reviewed: 12/24/2012 °ExitCare® Patient Information ©2014 [employer]. °Implanted Port Home Guide °An implanted port is a type of central line that is placed under the skin. Central lines are used to provide IV access when treatment or nutrition needs to be given through a person's veins. Implanted ports are used for long-term IV access. An implanted port may be placed because:  °· You need IV medicine that would be irritating to the small veins in your hands or arms.   °· You need long-term IV medicines, such as antibiotics.   °· You need IV nutrition for a long period.   °· You need frequent blood draws for lab tests.   °· You need dialysis.   °Implanted ports are usually placed in the chest area, but they can also be placed in the upper arm, the abdomen, or the leg. An implanted port has two main parts:  °· Reservoir. The reservoir is round and will appear as a small, raised area under your skin. The reservoir is the part where a needle is inserted to give medicines or draw blood.   °· Catheter. The catheter is a thin, flexible tube that extends from the reservoir. The catheter is placed into a large vein. Medicine that is inserted into the reservoir goes into the catheter and then into the vein.   °HOW WILL I CARE FOR MY   INCISION SITE? °Do not get the incision site wet. Bathe or shower as directed by your health care provider.  °HOW IS MY PORT ACCESSED? °Special steps must be taken to access the port:  °· Before the port is accessed, a numbing cream can be placed on the skin. This helps numb the skin over the port site.   °· Your health care provider uses a sterile technique to access the port. °· Your health care provider must put on a mask and sterile gloves. °· The skin over your port is cleaned carefully with an  antiseptic and allowed to dry. °· The port is gently pinched between sterile gloves, and a needle is inserted into the port. °· Only "non-coring" port needles should be used to access the port. Once the port is accessed, a blood return should be checked. This helps ensure that the port is in the vein and is not clogged.   °· If your port needs to remain accessed for a constant infusion, a clear (transparent) bandage will be placed over the needle site. The bandage and needle will need to be changed every week, or as directed by your health care provider.   °· Keep the bandage covering the needle clean and dry. Do not get it wet. Follow your health care provider's instructions on how to take a shower or bath while the port is accessed.   °· If your port does not need to stay accessed, no bandage is needed over the port.   °WHAT IS FLUSHING? °Flushing helps keep the port from getting clogged. Follow your health care provider's instructions on how and when to flush the port. Ports are usually flushed with saline solution or a medicine called heparin. The need for flushing will depend on how the port is used.  °· If the port is used for intermittent medicines or blood draws, the port will need to be flushed:   °· After medicines have been given.   °· After blood has been drawn.   °· As part of routine maintenance.   °· If a constant infusion is running, the port may not need to be flushed.   °HOW LONG WILL MY PORT STAY IMPLANTED? °The port can stay in for as long as your health care provider thinks it is needed. When it is time for the port to come out, surgery will be done to remove it. The procedure is similar to the one performed when the port was put in.  °WHEN SHOULD I SEEK IMMEDIATE MEDICAL CARE? °When you have an implanted port, you should seek immediate medical care if:  °· You notice a bad smell coming from the incision site.   °· You have swelling, redness, or drainage at the incision site.   °· You have more  swelling or pain at the port site or the surrounding area.   °· You have a fever that is not controlled with medicine. °Document Released: 04/18/2005 Document Revised: 02/06/2013 Document Reviewed: 12/24/2012 °ExitCare® Patient Information ©2014 [employer]. ° °

## 2013-09-03 NOTE — Procedures (Signed)
Procedure:  Port placement Findings:  Right IJ SL power port placed with tip at cavoatrial junction.  OK to use.

## 2013-09-06 ENCOUNTER — Ambulatory Visit (HOSPITAL_BASED_OUTPATIENT_CLINIC_OR_DEPARTMENT_OTHER): Payer: 59

## 2013-09-06 ENCOUNTER — Other Ambulatory Visit (HOSPITAL_BASED_OUTPATIENT_CLINIC_OR_DEPARTMENT_OTHER): Payer: 59

## 2013-09-06 VITALS — BP 127/67 | HR 87 | Temp 98.0°F

## 2013-09-06 DIAGNOSIS — C787 Secondary malignant neoplasm of liver and intrahepatic bile duct: Secondary | ICD-10-CM

## 2013-09-06 DIAGNOSIS — C801 Malignant (primary) neoplasm, unspecified: Secondary | ICD-10-CM

## 2013-09-06 DIAGNOSIS — C220 Liver cell carcinoma: Secondary | ICD-10-CM

## 2013-09-06 DIAGNOSIS — C240 Malignant neoplasm of extrahepatic bile duct: Secondary | ICD-10-CM

## 2013-09-06 DIAGNOSIS — Z5111 Encounter for antineoplastic chemotherapy: Secondary | ICD-10-CM

## 2013-09-06 LAB — CBC WITH DIFFERENTIAL/PLATELET
BASO%: 0.6 % (ref 0.0–2.0)
BASOS ABS: 0 10*3/uL (ref 0.0–0.1)
EOS%: 0.7 % (ref 0.0–7.0)
Eosinophils Absolute: 0 10*3/uL (ref 0.0–0.5)
HCT: 38 % (ref 34.8–46.6)
HEMOGLOBIN: 12.8 g/dL (ref 11.6–15.9)
LYMPH%: 19.7 % (ref 14.0–49.7)
MCH: 32.2 pg (ref 25.1–34.0)
MCHC: 33.8 g/dL (ref 31.5–36.0)
MCV: 95.5 fL (ref 79.5–101.0)
MONO#: 0.3 10*3/uL (ref 0.1–0.9)
MONO%: 4.7 % (ref 0.0–14.0)
NEUT#: 5.4 10*3/uL (ref 1.5–6.5)
NEUT%: 74.3 % (ref 38.4–76.8)
Platelets: 262 10*3/uL (ref 145–400)
RBC: 3.98 10*6/uL (ref 3.70–5.45)
RDW: 14.1 % (ref 11.2–14.5)
WBC: 7.2 10*3/uL (ref 3.9–10.3)
lymph#: 1.4 10*3/uL (ref 0.9–3.3)

## 2013-09-06 LAB — COMPREHENSIVE METABOLIC PANEL (CC13)
ALBUMIN: 4.3 g/dL (ref 3.5–5.0)
ALK PHOS: 66 U/L (ref 40–150)
ALT: 15 U/L (ref 0–55)
AST: 20 U/L (ref 5–34)
Anion Gap: 12 mEq/L — ABNORMAL HIGH (ref 3–11)
BUN: 11.2 mg/dL (ref 7.0–26.0)
CO2: 25 mEq/L (ref 22–29)
Calcium: 10.3 mg/dL (ref 8.4–10.4)
Chloride: 102 mEq/L (ref 98–109)
Creatinine: 0.8 mg/dL (ref 0.6–1.1)
Glucose: 120 mg/dl (ref 70–140)
Potassium: 3.6 mEq/L (ref 3.5–5.1)
Sodium: 139 mEq/L (ref 136–145)
Total Bilirubin: 0.52 mg/dL (ref 0.20–1.20)
Total Protein: 7 g/dL (ref 6.4–8.3)

## 2013-09-06 LAB — CANCER ANTIGEN 19-9: CA 19-9: 24.8 U/mL (ref <35.0)

## 2013-09-06 MED ORDER — PROCHLORPERAZINE MALEATE 10 MG PO TABS
10.0000 mg | ORAL_TABLET | Freq: Once | ORAL | Status: AC
  Administered 2013-09-06: 10 mg via ORAL

## 2013-09-06 MED ORDER — PROCHLORPERAZINE MALEATE 10 MG PO TABS
ORAL_TABLET | ORAL | Status: AC
  Filled 2013-09-06: qty 1

## 2013-09-06 MED ORDER — SODIUM CHLORIDE 0.9 % IV SOLN
Freq: Once | INTRAVENOUS | Status: AC
  Administered 2013-09-06: 10:00:00 via INTRAVENOUS

## 2013-09-06 MED ORDER — HEPARIN SOD (PORK) LOCK FLUSH 100 UNIT/ML IV SOLN
500.0000 [IU] | Freq: Once | INTRAVENOUS | Status: AC | PRN
  Administered 2013-09-06: 500 [IU]
  Filled 2013-09-06: qty 5

## 2013-09-06 MED ORDER — SODIUM CHLORIDE 0.9 % IV SOLN
1000.0000 mg/m2 | Freq: Once | INTRAVENOUS | Status: AC
  Administered 2013-09-06: 1938 mg via INTRAVENOUS
  Filled 2013-09-06: qty 50.97

## 2013-09-06 MED ORDER — SODIUM CHLORIDE 0.9 % IJ SOLN
10.0000 mL | INTRAMUSCULAR | Status: DC | PRN
  Administered 2013-09-06: 10 mL
  Filled 2013-09-06: qty 10

## 2013-09-06 NOTE — Patient Instructions (Signed)
[facility] Discharge Instructions for Patients Receiving Chemotherapy  Today you received the following chemotherapy agents Gemzar.  To help prevent nausea and vomiting after your treatment, we encourage you to take your nausea medication as prescribed.   If you develop nausea and vomiting that is not controlled by your nausea medication, call the clinic.   BELOW ARE SYMPTOMS THAT SHOULD BE REPORTED IMMEDIATELY:  *FEVER GREATER THAN 100.5 F  *CHILLS WITH OR WITHOUT FEVER  NAUSEA AND VOMITING THAT IS NOT CONTROLLED WITH YOUR NAUSEA MEDICATION  *UNUSUAL SHORTNESS OF BREATH  *UNUSUAL BRUISING OR BLEEDING  TENDERNESS IN MOUTH AND THROAT WITH OR WITHOUT PRESENCE OF ULCERS  *URINARY PROBLEMS  *BOWEL PROBLEMS  UNUSUAL RASH Items with * indicate a potential emergency and should be followed up as soon as possible.  Feel free to call the clinic you have any questions or concerns. The clinic phone number is [phone].    

## 2013-09-06 NOTE — Progress Notes (Signed)
Ok to treat with CMET from 08/07/13 per Dr. Alen Blew.

## 2013-09-09 ENCOUNTER — Telehealth: Payer: Self-pay | Admitting: *Deleted

## 2013-09-09 NOTE — Telephone Encounter (Signed)
Pt returned nurse's call for post chemo follow up.  Stated doing well after chemo.  Denied nausea/vomiting;  Appetite fine and drinking lots of fluids as tolerated.  Stated ongoing problem with constipation even prior to start of chemo.  Gave pt dietary choices to help with bowel functions.  Stated bladder functions fine.  Denied pain.  Pt aware of returned appts. Pt stated she had very good experience with first chemo treatment.  Staff were courteous, nice, and provided explanations to pt throughout her treatment.  No other concerns at this time.

## 2013-09-09 NOTE — Telephone Encounter (Signed)
Called pt on home phone and left message for pt to call triage nurse back for post chemo follow up.

## 2013-09-13 ENCOUNTER — Other Ambulatory Visit (HOSPITAL_BASED_OUTPATIENT_CLINIC_OR_DEPARTMENT_OTHER): Payer: 59

## 2013-09-13 ENCOUNTER — Encounter: Payer: Self-pay | Admitting: Physician Assistant

## 2013-09-13 ENCOUNTER — Ambulatory Visit (HOSPITAL_BASED_OUTPATIENT_CLINIC_OR_DEPARTMENT_OTHER): Payer: 59 | Admitting: Physician Assistant

## 2013-09-13 ENCOUNTER — Other Ambulatory Visit: Payer: Self-pay

## 2013-09-13 ENCOUNTER — Ambulatory Visit (HOSPITAL_BASED_OUTPATIENT_CLINIC_OR_DEPARTMENT_OTHER): Payer: 59

## 2013-09-13 VITALS — BP 138/69 | HR 74 | Temp 98.3°F | Resp 20 | Ht 65.0 in | Wt 181.0 lb

## 2013-09-13 DIAGNOSIS — C787 Secondary malignant neoplasm of liver and intrahepatic bile duct: Secondary | ICD-10-CM

## 2013-09-13 DIAGNOSIS — C221 Intrahepatic bile duct carcinoma: Secondary | ICD-10-CM

## 2013-09-13 DIAGNOSIS — C801 Malignant (primary) neoplasm, unspecified: Secondary | ICD-10-CM

## 2013-09-13 DIAGNOSIS — C240 Malignant neoplasm of extrahepatic bile duct: Secondary | ICD-10-CM

## 2013-09-13 DIAGNOSIS — C220 Liver cell carcinoma: Secondary | ICD-10-CM

## 2013-09-13 DIAGNOSIS — Z5111 Encounter for antineoplastic chemotherapy: Secondary | ICD-10-CM

## 2013-09-13 LAB — CBC WITH DIFFERENTIAL/PLATELET
BASO%: 0.9 % (ref 0.0–2.0)
Basophils Absolute: 0 10*3/uL (ref 0.0–0.1)
EOS ABS: 0 10*3/uL (ref 0.0–0.5)
EOS%: 0.6 % (ref 0.0–7.0)
HEMATOCRIT: 33.2 % — AB (ref 34.8–46.6)
HGB: 11.1 g/dL — ABNORMAL LOW (ref 11.6–15.9)
LYMPH#: 1.6 10*3/uL (ref 0.9–3.3)
LYMPH%: 46.8 % (ref 14.0–49.7)
MCH: 32 pg (ref 25.1–34.0)
MCHC: 33.3 g/dL (ref 31.5–36.0)
MCV: 96 fL (ref 79.5–101.0)
MONO#: 0.3 10*3/uL (ref 0.1–0.9)
MONO%: 9.8 % (ref 0.0–14.0)
NEUT%: 41.9 % (ref 38.4–76.8)
NEUTROS ABS: 1.4 10*3/uL — AB (ref 1.5–6.5)
Platelets: 206 10*3/uL (ref 145–400)
RBC: 3.46 10*6/uL — AB (ref 3.70–5.45)
RDW: 13.7 % (ref 11.2–14.5)
WBC: 3.3 10*3/uL — AB (ref 3.9–10.3)

## 2013-09-13 LAB — COMPREHENSIVE METABOLIC PANEL (CC13)
ALT: 35 U/L (ref 0–55)
AST: 28 U/L (ref 5–34)
Albumin: 3.9 g/dL (ref 3.5–5.0)
Alkaline Phosphatase: 62 U/L (ref 40–150)
Anion Gap: 12 mEq/L — ABNORMAL HIGH (ref 3–11)
BUN: 11.9 mg/dL (ref 7.0–26.0)
CALCIUM: 9.9 mg/dL (ref 8.4–10.4)
CHLORIDE: 103 meq/L (ref 98–109)
CO2: 24 meq/L (ref 22–29)
Creatinine: 0.8 mg/dL (ref 0.6–1.1)
GLUCOSE: 101 mg/dL (ref 70–140)
Potassium: 3.8 mEq/L (ref 3.5–5.1)
SODIUM: 139 meq/L (ref 136–145)
Total Bilirubin: 0.29 mg/dL (ref 0.20–1.20)
Total Protein: 6.8 g/dL (ref 6.4–8.3)

## 2013-09-13 MED ORDER — SODIUM CHLORIDE 0.9 % IJ SOLN
10.0000 mL | INTRAMUSCULAR | Status: DC | PRN
  Administered 2013-09-13: 10 mL
  Filled 2013-09-13: qty 10

## 2013-09-13 MED ORDER — SODIUM CHLORIDE 0.9 % IV SOLN
Freq: Once | INTRAVENOUS | Status: AC
  Administered 2013-09-13: 15:00:00 via INTRAVENOUS

## 2013-09-13 MED ORDER — PROCHLORPERAZINE MALEATE 10 MG PO TABS
ORAL_TABLET | ORAL | Status: AC
  Filled 2013-09-13: qty 1

## 2013-09-13 MED ORDER — HEPARIN SOD (PORK) LOCK FLUSH 100 UNIT/ML IV SOLN
500.0000 [IU] | Freq: Once | INTRAVENOUS | Status: AC | PRN
  Administered 2013-09-13: 500 [IU]
  Filled 2013-09-13: qty 5

## 2013-09-13 MED ORDER — SODIUM CHLORIDE 0.9 % IV SOLN
1000.0000 mg/m2 | Freq: Once | INTRAVENOUS | Status: AC
  Administered 2013-09-13: 1938 mg via INTRAVENOUS
  Filled 2013-09-13: qty 51

## 2013-09-13 MED ORDER — PROCHLORPERAZINE MALEATE 10 MG PO TABS
10.0000 mg | ORAL_TABLET | Freq: Once | ORAL | Status: AC
  Administered 2013-09-13: 10 mg via ORAL

## 2013-09-13 NOTE — Progress Notes (Signed)
Ok to treat with today's labs per Awilda Metro, PA.

## 2013-09-13 NOTE — Progress Notes (Signed)
Hematology and Oncology Follow Up Visit  Lindsay Aguirre [id number] 01/31/1951 63 y.o. 09/13/2013 2:35 PM [name], [place], [name], MD   Principle Diagnosis: 63 year old woman with metastatic adenocarcinoma into the liver most likely of a pancreatic or biliary origin. This was confirmed by biopsy on 08/14/2013 and imaging studies showed multifocal hepatic lesions.   Prior Therapy: Status post a liver biopsy on 08/14/2013.  Current therapy: Gemcitabine at 1000 mg per meter squared given weekly. Status post 1 cycle  Interim History:  This is a pleasant 63 year old woman who presents for symptom management visit after starting systemic chemotherapy with weekly gemcitabine. She overall is tolerating the chemotherapy relatively well the exception of some constipation. She is a prior history of constipation and takes a line plus a stool softener. She is also needing activity yogurt and prunes. These measures are helpful in managing her constipation. She was know the specific complaints today. She's had no issues with nausea or vomiting, fever, chills, shortness of breath chest pain, night sweats or diarrhea.  Has not reported any hematochezia or melena. Has not reported any decline in her energy level or performance status. She continues to perform his activities of daily living without any decline.  Medications: I have reviewed the patient's current medications.  Current Outpatient Prescriptions  Medication Sig Dispense Refill  . allopurinol (ZYLOPRIM) 300 MG tablet Take 1 tablet (300 mg total) by mouth daily.  30 tablet  5  . bifidobacterium infantis (ALIGN) capsule Take 1 capsule by mouth daily.      . Cholecalciferol (VITAMIN D) 2000 UNITS tablet Take 2,000 Units by mouth daily.      [name] Sodium (COLACE PO) Take 3 capsules by mouth daily.      [name] HYDROcodone-acetaminophen (NORCO/VICODIN) 5-325 MG per tablet Take 1 tablet by mouth every 6 (six) hours as needed for moderate pain.  10  tablet  0  . levothyroxine (SYNTHROID, LEVOTHROID) 88 MCG tablet Take 88 mcg by mouth daily before breakfast.      . lidocaine-prilocaine (EMLA) cream Apply 1 application topically as needed. Apply cream, on days of treatment, to [facility] site 1-2 hours prior to treatment.  30 g  0  . lisinopril-hydrochlorothiazide (PRINZIDE,ZESTORETIC) 20-12.5 MG per tablet Take 1 tablet by mouth every evening.      . ondansetron (ZOFRAN) 8 MG tablet Take 1 tablet (8 mg total) by mouth every 8 (eight) hours as needed for nausea or vomiting.  30 tablet  0  . vitamin C (ASCORBIC ACID) 500 MG tablet Take 500 mg by mouth daily.       No current facility-administered medications for this visit.     Allergies:  Allergies  Allergen Reactions  . Livalo [Pitavastatin]     Leg cramps  . Simvastatin     Leg cramps  . Statins     Leg Cramps     Past Medical History, Surgical history, Social history, and Family History were reviewed and updated.  Review of Systems: Constitutional:  Negative for fever, chills, night sweats, anorexia, weight loss, pain. Cardiovascular: no chest pain or dyspnea on exertion Respiratory: no cough, shortness of breath, or wheezing Neurological: negative for - dizziness, gait disturbance or headaches Dermatological: negative for acne, pruritus and rash ENT: negative for - epistaxis, headaches or hearing change Skin: Negative. Gastrointestinal: no abdominal pain, change in bowel habits, or black or bloody stools Genito-Urinary: no dysuria, trouble voiding, or hematuria Hematological and Lymphatic: negative for - bleeding problems, blood transfusions or bruising  Breast: negative Musculoskeletal: negative for - gait disturbance, muscle pain or muscular weakness Remaining ROS negative. Physical Exam: Blood pressure 138/69, pulse 74, temperature 98.3 F (36.8 C), temperature source Oral, resp. rate 20, height 5\' 5"  (1.651 m), weight 181 lb (82.101 kg), SpO2 100.00%. ECOG: 0 General  appearance: alert and cooperative Head: Normocephalic, without obvious abnormality, atraumatic Neck: no adenopathy, no carotid bruit, no JVD, supple, symmetrical, trachea midline and thyroid not enlarged, symmetric, no tenderness/mass/nodules Lymph nodes: Cervical, supraclavicular, and axillary nodes normal. Heart:regular rate and rhythm, S1, S2 normal, no murmur, click, rub or gallop Lung:chest clear, no wheezing, rales, normal symmetric air entry Abdomin: soft, non-tender, without masses or organomegaly EXT:no erythema, induration, or nodules   Lab Results: Lab Results  Component Value Date   WBC 3.3* 09/13/2013   HGB 11.1* 09/13/2013   HCT 33.2* 09/13/2013   MCV 96.0 09/13/2013   PLT 206 09/13/2013     Chemistry      Component Value Date/Time   NA 139 09/13/2013 1318   NA 136 08/07/2013 1430   NA 126* 07/11/2013 0610   K 3.8 09/13/2013 1318   K 4.7 08/07/2013 1430   CL 92* 08/07/2013 1430   CO2 24 09/13/2013 1318   CO2 27 08/07/2013 1430   BUN 11.9 09/13/2013 1318   BUN 12 08/07/2013 1430   BUN 11 07/11/2013 0610   CREATININE 0.8 09/13/2013 1318   CREATININE 1.00 08/07/2013 1430   CREATININE 0.81 10/24/2012 1203      Component Value Date/Time   CALCIUM 9.9 09/13/2013 1318   CALCIUM 10.8* 08/07/2013 1430   ALKPHOS 62 09/13/2013 1318   ALKPHOS 60 08/07/2013 1430   AST 28 09/13/2013 1318   AST 19 08/07/2013 1430   ALT 35 09/13/2013 1318   ALT 19 08/07/2013 1430   BILITOT 0.29 09/13/2013 1318   BILITOT 0.4 08/07/2013 1430       Impression and Plan:   64 year old woman with the following issues:  1. 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 She's currently being treated with systemic chemotherapy with gemcitabine 1000 mg rate regular on a weekly basis, status post 1 cycle. She is tolerating the chemotherapy relatively well with the exception of some constipation. Her current measures are adequate in controlling her constipation.  .  2. IV  access: Status post Port-A-Cath placement on 09/03/2013.   3. Antiemetics: Continue Zofran as needed   4. Followup: One week for another assessment prior to her next scheduled cycle of weekly chemotherapy on 09/20/2013  Carlton Adam, PA-C  5/15/20152:35 PM

## 2013-09-13 NOTE — Patient Instructions (Signed)
[facility] Discharge Instructions for Patients Receiving Chemotherapy  Today you received the following chemotherapy agent Gemzar.  To help prevent nausea and vomiting after your treatment, we encourage you to take your nausea medication.   If you develop nausea and vomiting that is not controlled by your nausea medication, call the clinic.   BELOW ARE SYMPTOMS THAT SHOULD BE REPORTED IMMEDIATELY:  *FEVER GREATER THAN 100.5 F  *CHILLS WITH OR WITHOUT FEVER  NAUSEA AND VOMITING THAT IS NOT CONTROLLED WITH YOUR NAUSEA MEDICATION  *UNUSUAL SHORTNESS OF BREATH  *UNUSUAL BRUISING OR BLEEDING  TENDERNESS IN MOUTH AND THROAT WITH OR WITHOUT PRESENCE OF ULCERS  *URINARY PROBLEMS  *BOWEL PROBLEMS  UNUSUAL RASH Items with * indicate a potential emergency and should be followed up as soon as possible.  Feel free to call the clinic you have any questions or concerns. The clinic phone number is [phone].    

## 2013-09-17 NOTE — Patient Instructions (Signed)
Continue labs and chemotherapy as scheduled Followup in one week for another symptom management visit prior to next scheduled cycle of weekly chemotherapy

## 2013-09-20 ENCOUNTER — Ambulatory Visit (HOSPITAL_BASED_OUTPATIENT_CLINIC_OR_DEPARTMENT_OTHER): Payer: 59

## 2013-09-20 ENCOUNTER — Encounter: Payer: Self-pay | Admitting: Oncology

## 2013-09-20 ENCOUNTER — Ambulatory Visit (HOSPITAL_BASED_OUTPATIENT_CLINIC_OR_DEPARTMENT_OTHER): Payer: 59 | Admitting: Oncology

## 2013-09-20 ENCOUNTER — Other Ambulatory Visit (HOSPITAL_BASED_OUTPATIENT_CLINIC_OR_DEPARTMENT_OTHER): Payer: 59

## 2013-09-20 ENCOUNTER — Telehealth: Payer: Self-pay | Admitting: Oncology

## 2013-09-20 VITALS — BP 141/66 | HR 89 | Temp 97.6°F | Resp 18 | Ht 65.0 in | Wt 178.8 lb

## 2013-09-20 DIAGNOSIS — C240 Malignant neoplasm of extrahepatic bile duct: Secondary | ICD-10-CM

## 2013-09-20 DIAGNOSIS — C220 Liver cell carcinoma: Secondary | ICD-10-CM

## 2013-09-20 DIAGNOSIS — C787 Secondary malignant neoplasm of liver and intrahepatic bile duct: Secondary | ICD-10-CM

## 2013-09-20 DIAGNOSIS — C801 Malignant (primary) neoplasm, unspecified: Secondary | ICD-10-CM

## 2013-09-20 DIAGNOSIS — C221 Intrahepatic bile duct carcinoma: Secondary | ICD-10-CM

## 2013-09-20 DIAGNOSIS — Z5111 Encounter for antineoplastic chemotherapy: Secondary | ICD-10-CM

## 2013-09-20 DIAGNOSIS — K5900 Constipation, unspecified: Secondary | ICD-10-CM

## 2013-09-20 LAB — CBC WITH DIFFERENTIAL/PLATELET
BASO%: 0.6 % (ref 0.0–2.0)
BASOS ABS: 0 10*3/uL (ref 0.0–0.1)
EOS ABS: 0 10*3/uL (ref 0.0–0.5)
EOS%: 0.3 % (ref 0.0–7.0)
HCT: 33.9 % — ABNORMAL LOW (ref 34.8–46.6)
HEMOGLOBIN: 11.7 g/dL (ref 11.6–15.9)
LYMPH%: 50.1 % — ABNORMAL HIGH (ref 14.0–49.7)
MCH: 32.1 pg (ref 25.1–34.0)
MCHC: 34.5 g/dL (ref 31.5–36.0)
MCV: 93.1 fL (ref 79.5–101.0)
MONO#: 0.2 10*3/uL (ref 0.1–0.9)
MONO%: 6.9 % (ref 0.0–14.0)
NEUT%: 42.1 % (ref 38.4–76.8)
NEUTROS ABS: 1.4 10*3/uL — AB (ref 1.5–6.5)
Platelets: 178 10*3/uL (ref 145–400)
RBC: 3.64 10*6/uL — AB (ref 3.70–5.45)
RDW: 13.5 % (ref 11.2–14.5)
WBC: 3.4 10*3/uL — AB (ref 3.9–10.3)
lymph#: 1.7 10*3/uL (ref 0.9–3.3)

## 2013-09-20 LAB — COMPREHENSIVE METABOLIC PANEL (CC13)
ALBUMIN: 4 g/dL (ref 3.5–5.0)
ALK PHOS: 64 U/L (ref 40–150)
ALT: 45 U/L (ref 0–55)
ANION GAP: 12 meq/L — AB (ref 3–11)
AST: 27 U/L (ref 5–34)
BUN: 10.7 mg/dL (ref 7.0–26.0)
CO2: 24 meq/L (ref 22–29)
Calcium: 10.4 mg/dL (ref 8.4–10.4)
Chloride: 100 mEq/L (ref 98–109)
Creatinine: 0.8 mg/dL (ref 0.6–1.1)
GLUCOSE: 107 mg/dL (ref 70–140)
Potassium: 3.6 mEq/L (ref 3.5–5.1)
SODIUM: 136 meq/L (ref 136–145)
TOTAL PROTEIN: 7.3 g/dL (ref 6.4–8.3)
Total Bilirubin: 0.32 mg/dL (ref 0.20–1.20)

## 2013-09-20 MED ORDER — SODIUM CHLORIDE 0.9 % IV SOLN
Freq: Once | INTRAVENOUS | Status: AC
  Administered 2013-09-20: 14:00:00 via INTRAVENOUS

## 2013-09-20 MED ORDER — PROCHLORPERAZINE MALEATE 10 MG PO TABS
ORAL_TABLET | ORAL | Status: AC
  Filled 2013-09-20: qty 1

## 2013-09-20 MED ORDER — SODIUM CHLORIDE 0.9 % IJ SOLN
10.0000 mL | INTRAMUSCULAR | Status: DC | PRN
  Administered 2013-09-20: 10 mL
  Filled 2013-09-20: qty 10

## 2013-09-20 MED ORDER — HEPARIN SOD (PORK) LOCK FLUSH 100 UNIT/ML IV SOLN
500.0000 [IU] | Freq: Once | INTRAVENOUS | Status: AC | PRN
  Administered 2013-09-20: 500 [IU]
  Filled 2013-09-20: qty 5

## 2013-09-20 MED ORDER — SODIUM CHLORIDE 0.9 % IV SOLN
1000.0000 mg/m2 | Freq: Once | INTRAVENOUS | Status: AC
  Administered 2013-09-20: 1938 mg via INTRAVENOUS
  Filled 2013-09-20: qty 50.97

## 2013-09-20 MED ORDER — SENNA 8.6 MG PO TABS: 2.0000 | ORAL_TABLET | Freq: Every day | ORAL | Status: DC

## 2013-09-20 MED ORDER — PROCHLORPERAZINE MALEATE 10 MG PO TABS
10.0000 mg | ORAL_TABLET | Freq: Once | ORAL | Status: AC
  Administered 2013-09-20: 10 mg via ORAL

## 2013-09-20 NOTE — Progress Notes (Signed)
Hematology and Oncology Follow Up Visit  TAZIA ILLESCAS [id number] 1950/10/09 63 y.o. 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 This was confirmed by biopsy on 08/14/2013 and imaging studies showed multifocal hepatic lesions.   Prior Therapy: Status post a liver biopsy on 08/14/2013.  Current therapy: Gemcitabine at 1000 mg per meter squared given weekly 3 weeks on and one week off. Today is day 15 of cycle 1.  Interim History:  This is a pleasant 63 year old woman who presents for followup and evaluation while she is on systemic chemotherapy with weekly gemcitabine. She is tolerating chemotherapy relatively well. She did report grade 1 fatigue, and grade 1 nausea. Which was manageable with anti-emetics. She does live an episode of chills but no fever after last week's chemotherapy infusion. She did reports constipation for which she rakes Align and stool softener. She is also using yogurt and prunes without excellent benefits. She reports that she continues to move her bowels once every 3 days and they are hard. She is not reporting any abdominal pain or hematochezia. Is not reporting any vomiting with her mild nausea. She was know the specific complaints today. She reports no fever, chills, shortness of breath chest pain, night sweats or diarrhea.  Has not reported any hematochezia or melena. Has not reported any decline in her energy level or performance status. She continues to perform his activities of daily living without any decline. She has not reported any syncope or neurological changes. She has not reported any arthralgias or myalgias. Did not report any skin rashes or lesions. Did not report any lymphadenopathy or petechiae. She had no infusion-related complications with chemotherapy. Rest or view of system is unremarkable.  Medications: I  have reviewed the patient's current medications. Unchanged by my review. Current Outpatient Prescriptions  Medication Sig Dispense Refill  . allopurinol (ZYLOPRIM) 300 MG tablet Take 1 tablet (300 mg total) by mouth daily.  30 tablet  5  . bifidobacterium infantis (ALIGN) capsule Take 1 capsule by mouth daily.      [name] BIOTIN PO Take 1 tablet by mouth daily.      . Cholecalciferol (VITAMIN D) 2000 UNITS tablet Take 2,000 Units by mouth daily.      [name] Sodium (COLACE PO) Take 3 capsules by mouth daily.      [name] HYDROcodone-acetaminophen (NORCO/VICODIN) 5-325 MG per tablet Take 1 tablet by mouth every 6 (six) hours as needed for moderate pain.  10 tablet  0  . levothyroxine (SYNTHROID, LEVOTHROID) 88 MCG tablet Take 88 mcg by mouth daily before breakfast.      . lidocaine-prilocaine (EMLA) cream Apply 1 application topically as needed. Apply cream, on days of treatment, to [facility] site 1-2 hours prior to treatment.  30 g  0  . lisinopril-hydrochlorothiazide (PRINZIDE,ZESTORETIC) 20-12.5 MG per tablet Take 1 tablet by mouth every evening.      . ondansetron (ZOFRAN) 8 MG tablet Take 1 tablet (8 mg total) by mouth every 8 (eight) hours as needed for nausea or vomiting.  30 tablet  0  . vitamin C (ASCORBIC ACID) 500 MG tablet Take 500 mg by mouth daily.      [name] senna (SENOKOT) 8.6 MG TABS tablet Take 2 tablets (17.2 mg total) by mouth daily.  60 each  0   No current facility-administered medications for this visit.     Allergies:  Allergies  Allergen Reactions  . Livalo [Pitavastatin]     Leg cramps  . Simvastatin     Leg cramps  . Statins     Leg Cramps     Past Medical History, Surgical history, Social history, and Family History were reviewed and updated.  Physical Exam: Blood pressure 141/66, pulse 89, temperature 97.6 F (36.4 C), temperature source Oral, resp. rate 18, height 5\' 5"  (1.651 m), weight 178 lb 12.8 oz (81.103 kg). ECOG: 0 General appearance: alert, cooperative,  appears stated age and no distress Head: Normocephalic, without obvious abnormality Neck: no adenopathy, supple, symmetrical, trachea midline and No thyroid masses. Lymph nodes: Cervical, supraclavicular, and axillary nodes normal. Heart:regular rate and rhythm, S1, S2.  Lung:chest clear, no wheezing. Dullness to percussion. Abdomin: soft, non-tender, without masses or organomegaly EXT:no erythema, induration, or edema   Lab Results: Lab Results  Component Value Date   WBC 3.4* 09/20/2013   HGB 11.7 09/20/2013   HCT 33.9* 09/20/2013   MCV 93.1 09/20/2013   PLT 178 09/20/2013     Chemistry      Component Value Date/Time   NA 139 09/13/2013 1318   NA 136 08/07/2013 1430   NA 126* 07/11/2013 0610   K 3.8 09/13/2013 1318   K 4.7 08/07/2013 1430   CL 92* 08/07/2013 1430   CO2 24 09/13/2013 1318   CO2 27 08/07/2013 1430   BUN 11.9 09/13/2013 1318   BUN 12 08/07/2013 1430   BUN 11 07/11/2013 0610   CREATININE 0.8 09/13/2013 1318   CREATININE 1.00 08/07/2013 1430   CREATININE 0.81 10/24/2012 1203      Component Value Date/Time   CALCIUM 9.9 09/13/2013 1318   CALCIUM 10.8* 08/07/2013 1430   ALKPHOS 62 09/13/2013 1318   ALKPHOS 60 08/07/2013 1430   AST 28 09/13/2013 1318   AST 19 08/07/2013 1430   ALT 35 09/13/2013 1318   ALT 19 08/07/2013 1430   BILITOT 0.29 09/13/2013 1318   BILITOT 0.4 08/07/2013 1430       Impression and Plan:   63 year old woman with the following issues:  1. 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 She is on chemotherapy with gemcitabine 1000 mg day 1, 8 and 15 of a 28 day cycle. She is tolerating the chemotherapy relatively well with the exception of some constipation. He is ready to proceed with day 15 today and will resume cycle 2 on 10/04/2013. She will have a repeat CT scan upon the completion of cycle 2 which will conclude on 10/18/2013.  2. IV access: Status post Port-A-Cath placement on 09/03/2013. No complications  noted per her report.  3. Antiemetics: Continue Zofran as needed   4. Constipation: This is exacerbated by systemic chemotherapy. I gave her prescription for Senokot S. to take instead of her Colace stool softener.  5. Followup: On 10/04/2013 for cycle 2 day 1.  Wyatt Portela, MD 5/22/20151:19 PM

## 2013-09-20 NOTE — Patient Instructions (Signed)
[facility] Discharge Instructions for Patients Receiving Chemotherapy  Today you received the following chemotherapy agents Gemzar.  To help prevent nausea and vomiting after your treatment, we encourage you to take your nausea medication.   If you develop nausea and vomiting that is not controlled by your nausea medication, call the clinic.   BELOW ARE SYMPTOMS THAT SHOULD BE REPORTED IMMEDIATELY:  *FEVER GREATER THAN 100.5 F  *CHILLS WITH OR WITHOUT FEVER  NAUSEA AND VOMITING THAT IS NOT CONTROLLED WITH YOUR NAUSEA MEDICATION  *UNUSUAL SHORTNESS OF BREATH  *UNUSUAL BRUISING OR BLEEDING  TENDERNESS IN MOUTH AND THROAT WITH OR WITHOUT PRESENCE OF ULCERS  *URINARY PROBLEMS  *BOWEL PROBLEMS  UNUSUAL RASH Items with * indicate a potential emergency and should be followed up as soon as possible.  Feel free to call the clinic you have any questions or concerns. The clinic phone number is [phone].    

## 2013-09-20 NOTE — Telephone Encounter (Signed)
pt given appt schedule for june prior to leaving today

## 2013-10-04 ENCOUNTER — Ambulatory Visit (HOSPITAL_BASED_OUTPATIENT_CLINIC_OR_DEPARTMENT_OTHER): Payer: 59

## 2013-10-04 ENCOUNTER — Ambulatory Visit (HOSPITAL_BASED_OUTPATIENT_CLINIC_OR_DEPARTMENT_OTHER): Payer: 59 | Admitting: Family

## 2013-10-04 ENCOUNTER — Other Ambulatory Visit (HOSPITAL_BASED_OUTPATIENT_CLINIC_OR_DEPARTMENT_OTHER): Payer: 59

## 2013-10-04 VITALS — BP 120/61 | HR 82 | Temp 97.9°F | Resp 18 | Ht 65.0 in | Wt 181.1 lb

## 2013-10-04 DIAGNOSIS — C221 Intrahepatic bile duct carcinoma: Secondary | ICD-10-CM

## 2013-10-04 DIAGNOSIS — C787 Secondary malignant neoplasm of liver and intrahepatic bile duct: Secondary | ICD-10-CM

## 2013-10-04 DIAGNOSIS — Z5111 Encounter for antineoplastic chemotherapy: Secondary | ICD-10-CM

## 2013-10-04 DIAGNOSIS — C801 Malignant (primary) neoplasm, unspecified: Secondary | ICD-10-CM

## 2013-10-04 LAB — COMPREHENSIVE METABOLIC PANEL (CC13)
ALT: 32 U/L (ref 0–55)
ANION GAP: 13 meq/L — AB (ref 3–11)
AST: 22 U/L (ref 5–34)
Albumin: 4 g/dL (ref 3.5–5.0)
Alkaline Phosphatase: 64 U/L (ref 40–150)
BUN: 11.3 mg/dL (ref 7.0–26.0)
CALCIUM: 9.5 mg/dL (ref 8.4–10.4)
CHLORIDE: 101 meq/L (ref 98–109)
CO2: 21 meq/L — AB (ref 22–29)
Creatinine: 0.8 mg/dL (ref 0.6–1.1)
Glucose: 125 mg/dl (ref 70–140)
POTASSIUM: 3.1 meq/L — AB (ref 3.5–5.1)
Sodium: 135 mEq/L — ABNORMAL LOW (ref 136–145)
Total Bilirubin: 0.4 mg/dL (ref 0.20–1.20)
Total Protein: 6.8 g/dL (ref 6.4–8.3)

## 2013-10-04 LAB — CBC WITH DIFFERENTIAL/PLATELET
BASO%: 0.7 % (ref 0.0–2.0)
BASOS ABS: 0 10*3/uL (ref 0.0–0.1)
EOS ABS: 0.1 10*3/uL (ref 0.0–0.5)
EOS%: 0.9 % (ref 0.0–7.0)
HEMATOCRIT: 33.7 % — AB (ref 34.8–46.6)
HGB: 11.4 g/dL — ABNORMAL LOW (ref 11.6–15.9)
LYMPH#: 1.3 10*3/uL (ref 0.9–3.3)
LYMPH%: 19.2 % (ref 14.0–49.7)
MCH: 32.9 pg (ref 25.1–34.0)
MCHC: 33.8 g/dL (ref 31.5–36.0)
MCV: 97.4 fL (ref 79.5–101.0)
MONO#: 0.5 10*3/uL (ref 0.1–0.9)
MONO%: 7 % (ref 0.0–14.0)
NEUT%: 72.2 % (ref 38.4–76.8)
NEUTROS ABS: 5 10*3/uL (ref 1.5–6.5)
Platelets: 331 10*3/uL (ref 145–400)
RBC: 3.46 10*6/uL — ABNORMAL LOW (ref 3.70–5.45)
RDW: 14.3 % (ref 11.2–14.5)
WBC: 6.9 10*3/uL (ref 3.9–10.3)

## 2013-10-04 MED ORDER — SODIUM CHLORIDE 0.9 % IJ SOLN
10.0000 mL | INTRAMUSCULAR | Status: DC | PRN
  Administered 2013-10-04: 10 mL
  Filled 2013-10-04: qty 10

## 2013-10-04 MED ORDER — PROCHLORPERAZINE MALEATE 10 MG PO TABS
10.0000 mg | ORAL_TABLET | Freq: Once | ORAL | Status: AC
  Administered 2013-10-04: 10 mg via ORAL

## 2013-10-04 MED ORDER — PROCHLORPERAZINE MALEATE 10 MG PO TABS
ORAL_TABLET | ORAL | Status: AC
  Filled 2013-10-04: qty 1

## 2013-10-04 MED ORDER — HEPARIN SOD (PORK) LOCK FLUSH 100 UNIT/ML IV SOLN
500.0000 [IU] | Freq: Once | INTRAVENOUS | Status: AC | PRN
  Administered 2013-10-04: 500 [IU]
  Filled 2013-10-04: qty 5

## 2013-10-04 MED ORDER — SODIUM CHLORIDE 0.9 % IV SOLN
Freq: Once | INTRAVENOUS | Status: AC
  Administered 2013-10-04: 15:00:00 via INTRAVENOUS

## 2013-10-04 MED ORDER — GEMCITABINE HCL CHEMO INJECTION 1 GM/26.3ML
1000.0000 mg/m2 | Freq: Once | INTRAVENOUS | Status: AC
  Administered 2013-10-04: 1938 mg via INTRAVENOUS
  Filled 2013-10-04: qty 50.97

## 2013-10-04 NOTE — Progress Notes (Signed)
[name] [id number]  Cholamgiocarcinoma  ROS: General ROS: negative ENT ROS: negative Respiratory ROS: no cough, shortness of breath, or wheezing negative Cardiovascular ROS: no chest pain or dyspnea on exertion negative Gastrointestinal ROS: positive for - abdominal pain Genito-Urinary ROS: no dysuria, trouble voiding, or hematuria negative Musculoskeletal ROS: negative Neurological ROS: no TIA or stroke symptoms negative  CURRENT THERAPY: Gemcitabine at 1000 mg per meter squared given weekly 3 weeks on and one-week off. She is on day 1 of cycle 4.   INTERVAL HISTORY: Lindsay Aguirre 63 y.o. 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  this was confirmed biopsy on 08/14/2013 and imaging studies showed multifocal hepatic lesions.  Prior therapy: Status post liver biopsy 08/14/2013.  Past Medical History  Diagnosis Date  . PONV (postoperative nausea and vomiting)   . Hypertension   . Hypothyroidism   . Arthritis   . Gout   . Neuromuscular disorder   . Hyperlipidemia   . Family history of anesthesia complication     '" MY OLDEST SON [place] ME "  . 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     is allergic to livalo; simvastatin; and statins.  Lindsay Aguirre does not currently have medications on file.  Past Surgical History  Procedure Laterality Date  . Cervical fusion  '99 & '03  . Rotator cuff surgery  '08    right  . Cataract extraction w/phaco  12/13/2010    Procedure: CATARACT EXTRACTION PHACO AND INTRAOCULAR LENS PLACEMENT (IOC);  Surgeon: Tonny Branch;  Location: [facility];  Service: Ophthalmology;  Laterality: Right;  CDE: 8.94  . Eye surgery    . Back  surgery      2009  . Back surgery  2011    lumbar surgery  . Tubal ligation  1979  . Rotator cuff repair Left 04/09/2013    DR Marlou Sa  . Shoulder arthroscopy with subacromial decompression and open rotator c Left 04/09/2013    Procedure: SHOULDER ARTHROSCOPY WITH SUBACROMIAL DECOMPRESSION AND MINI OPEN ROTATOR CUFF REPAIR, POSSIBLE BICEP TENDONODESIS,  ;  Surgeon: Meredith Pel, MD;  Location: [facility];  Service: Orthopedics;  Laterality: Left;      PHYSICAL EXAMINATION   ECOG PERFORMANCE STATUS: 0 - Asymptomatic  Filed Vitals:   10/04/13 1347  BP: 120/61  Pulse: 82  Temp: 97.9 F (36.6 C)  Resp: 18    GENERAL:alert, no distress, well nourished, well developed, comfortable and cooperative SKIN: skin color, texture, turgor are normal, no rashes or significant lesions HEAD: Normocephalic, No masses, lesions, tenderness or abnormalities EARS: External ears normal OROPHARYNX:no exudate, no erythema, lips, buccal mucosa, and tongue normal, dentition normal and mucous membranes are moist  NECK: no adenopathy, no bruits, no JVD, thyroid normal size, non-tender, without nodularity, no stridor, non-tender, trachea midline LYMPH:  no palpable lymphadenopathy, patient has hepatomegaly 1 cm below right costal margin slightly tender. BREAST:breasts appear normal, no suspicious masses, no skin or nipple changes or axillary nodes LUNGS: chest symmetric with normal A/P diameter, no chest deformities noted, normal respiratory rate and rhythm, no chest wall tenderness, diaphragmatic excursion normal, lungs clear to auscultation, clear to auscultation , and palpation, clear to auscultation and percussion HEART: regular rate & rhythm, no murmurs, no gallops, S1  normal and S2 normal ABDOMEN:abdomen soft, non-tender, normal bowel sounds, no masses or organomegaly, no bruits, has hepatomegaly 1 cm below right costal margin slightly tender, complains of burning and stinging periodically across her right  flank but does not radiate into her back BACK: Back symmetric, no curvature., No CVA tenderness, Range of motion is normal, no skin lesions, erythema or scars, no tenderness to percussion or palpation EXTREMITIES:less then 2 second capillary refill, no joint deformities, effusion, or inflammation, no edema, no skin discoloration, no clubbing, no cyanosis  LABORATORY DATA:  Lab Results  Component Value Date   WBC 6.9 10/04/2013   HGB 11.4* 10/04/2013   HCT 33.7* 10/04/2013   MCV 97.4 10/04/2013   PLT 331 10/04/2013     Chemistry      Component Value Date/Time   NA 135* 10/04/2013 1328   NA 136 08/07/2013 1430   NA 126* 07/11/2013 0610   K 3.1* 10/04/2013 1328   K 4.7 08/07/2013 1430   CL 92* 08/07/2013 1430   CO2 21* 10/04/2013 1328   CO2 27 08/07/2013 1430   BUN 11.3 10/04/2013 1328   BUN 12 08/07/2013 1430   BUN 11 07/11/2013 0610   CREATININE 0.8 10/04/2013 1328   CREATININE 1.00 08/07/2013 1430   CREATININE 0.81 10/24/2012 1203      Component Value Date/Time   CALCIUM 9.5 10/04/2013 1328   CALCIUM 10.8* 08/07/2013 1430   ALKPHOS 64 10/04/2013 1328   ALKPHOS 60 08/07/2013 1430   AST 22 10/04/2013 1328   AST 19 08/07/2013 1430   ALT 32 10/04/2013 1328   ALT 19 08/07/2013 1430   BILITOT 0.40 10/04/2013 1328   BILITOT 0.4 08/07/2013 1430      RADIOGRAPHIC STUDIES:   No results found.  ASSESSMENT: Patient is a pleasant 63 year old female who presents for followup and evaluation while she was on systemic chemotherapy with weekly gemcitabine. She is tolerating chemotherapy relatively well. She states that her fatigue is improved and she now has more energy. She denies any nausea or vomiting. She denies having any fever or chills. She states that she does periodically have some constipation but it is controlled with Senokot S. She denies diarrhea or blood in stool. Patient states that she periodically has some burning and stinging across her right flank that does not extend into her back. She states that she has more of an  appetite and is eating better. She reports no fever, chills, shortness of breath, chest pain, night sweats or diarrhea. She denies having any skin rashes or lesions. She denies having any infusion-related complications with her chemotherapy.   PLAN: Patient will go to chemotherapy today as scheduled. She has followup appointment with Dr. Acquanetta Chain on 10/18/2013. Will continue to monitor her hepatomegaly. Will also monitor patient for ascites.   THERAPY PLAN:  Discussed current labs and imaging with patient. She was given instructions for chemo and her future appointment list.    All questions were answered. The patient knows to call the clinic with any problems, questions or concerns. We can certainly see the patient much sooner if necessary.  The patient and plan discussed with Zola Button, MD and he is in agreement with the aforementioned.  I spent 20 minutes counseling the patient face to face. The total time spent in the appointment was 30 minutes.  [place], FNP-BC 10/04/2013

## 2013-10-04 NOTE — Patient Instructions (Signed)

## 2013-10-08 NOTE — Patient Instructions (Signed)
Discussed current labs and imaging with patient. She was given instructions for chemo and her future appointment list.    All questions were answered. The patient knows to call the clinic with any problems, questions or concerns. We can certainly see the patient much sooner if necessary.

## 2013-10-11 ENCOUNTER — Other Ambulatory Visit (HOSPITAL_BASED_OUTPATIENT_CLINIC_OR_DEPARTMENT_OTHER): Payer: 59

## 2013-10-11 ENCOUNTER — Ambulatory Visit (HOSPITAL_BASED_OUTPATIENT_CLINIC_OR_DEPARTMENT_OTHER): Payer: 59

## 2013-10-11 VITALS — BP 125/78 | HR 85 | Temp 98.5°F

## 2013-10-11 DIAGNOSIS — C221 Intrahepatic bile duct carcinoma: Secondary | ICD-10-CM

## 2013-10-11 DIAGNOSIS — C801 Malignant (primary) neoplasm, unspecified: Secondary | ICD-10-CM

## 2013-10-11 DIAGNOSIS — Z5111 Encounter for antineoplastic chemotherapy: Secondary | ICD-10-CM

## 2013-10-11 DIAGNOSIS — C787 Secondary malignant neoplasm of liver and intrahepatic bile duct: Secondary | ICD-10-CM

## 2013-10-11 LAB — CBC WITH DIFFERENTIAL/PLATELET
BASO%: 2.5 % — AB (ref 0.0–2.0)
Basophils Absolute: 0.1 10*3/uL (ref 0.0–0.1)
EOS%: 0.9 % (ref 0.0–7.0)
Eosinophils Absolute: 0 10*3/uL (ref 0.0–0.5)
HCT: 34.3 % — ABNORMAL LOW (ref 34.8–46.6)
HGB: 11.4 g/dL — ABNORMAL LOW (ref 11.6–15.9)
LYMPH#: 1.7 10*3/uL (ref 0.9–3.3)
LYMPH%: 41.6 % (ref 14.0–49.7)
MCH: 32.6 pg (ref 25.1–34.0)
MCHC: 33.4 g/dL (ref 31.5–36.0)
MCV: 97.7 fL (ref 79.5–101.0)
MONO#: 0.3 10*3/uL (ref 0.1–0.9)
MONO%: 8.1 % (ref 0.0–14.0)
NEUT#: 1.9 10*3/uL (ref 1.5–6.5)
NEUT%: 46.9 % (ref 38.4–76.8)
Platelets: 451 10*3/uL — ABNORMAL HIGH (ref 145–400)
RBC: 3.51 10*6/uL — AB (ref 3.70–5.45)
RDW: 15.3 % — AB (ref 11.2–14.5)
WBC: 4 10*3/uL (ref 3.9–10.3)

## 2013-10-11 LAB — COMPREHENSIVE METABOLIC PANEL (CC13)
ALBUMIN: 4 g/dL (ref 3.5–5.0)
ALT: 52 U/L (ref 0–55)
ANION GAP: 11 meq/L (ref 3–11)
AST: 37 U/L — ABNORMAL HIGH (ref 5–34)
Alkaline Phosphatase: 70 U/L (ref 40–150)
BUN: 10.3 mg/dL (ref 7.0–26.0)
CALCIUM: 9.8 mg/dL (ref 8.4–10.4)
CHLORIDE: 100 meq/L (ref 98–109)
CO2: 23 meq/L (ref 22–29)
Creatinine: 0.8 mg/dL (ref 0.6–1.1)
Glucose: 146 mg/dl — ABNORMAL HIGH (ref 70–140)
Potassium: 3.3 mEq/L — ABNORMAL LOW (ref 3.5–5.1)
SODIUM: 134 meq/L — AB (ref 136–145)
TOTAL PROTEIN: 7.2 g/dL (ref 6.4–8.3)
Total Bilirubin: 0.33 mg/dL (ref 0.20–1.20)

## 2013-10-11 MED ORDER — SODIUM CHLORIDE 0.9 % IV SOLN
Freq: Once | INTRAVENOUS | Status: AC
  Administered 2013-10-11: 14:00:00 via INTRAVENOUS

## 2013-10-11 MED ORDER — PROCHLORPERAZINE MALEATE 10 MG PO TABS
10.0000 mg | ORAL_TABLET | Freq: Once | ORAL | Status: AC
  Administered 2013-10-11: 10 mg via ORAL

## 2013-10-11 MED ORDER — HEPARIN SOD (PORK) LOCK FLUSH 100 UNIT/ML IV SOLN
500.0000 [IU] | Freq: Once | INTRAVENOUS | Status: AC | PRN
  Administered 2013-10-11: 500 [IU]
  Filled 2013-10-11: qty 5

## 2013-10-11 MED ORDER — PROCHLORPERAZINE MALEATE 10 MG PO TABS
ORAL_TABLET | ORAL | Status: AC
  Filled 2013-10-11: qty 1

## 2013-10-11 MED ORDER — SODIUM CHLORIDE 0.9 % IJ SOLN
10.0000 mL | INTRAMUSCULAR | Status: DC | PRN
  Administered 2013-10-11: 10 mL
  Filled 2013-10-11: qty 10

## 2013-10-11 MED ORDER — SODIUM CHLORIDE 0.9 % IV SOLN
1000.0000 mg/m2 | Freq: Once | INTRAVENOUS | Status: AC
  Administered 2013-10-11: 1938 mg via INTRAVENOUS
  Filled 2013-10-11: qty 50.97

## 2013-10-11 NOTE — Patient Instructions (Signed)
[facility] Discharge Instructions for Patients Receiving Chemotherapy  Today you received the following chemotherapy agents Gemzar.  To help prevent nausea and vomiting after your treatment, we encourage you to take your nausea medication as prescribed.   If you develop nausea and vomiting that is not controlled by your nausea medication, call the clinic.   BELOW ARE SYMPTOMS THAT SHOULD BE REPORTED IMMEDIATELY:  *FEVER GREATER THAN 100.5 F  *CHILLS WITH OR WITHOUT FEVER  NAUSEA AND VOMITING THAT IS NOT CONTROLLED WITH YOUR NAUSEA MEDICATION  *UNUSUAL SHORTNESS OF BREATH  *UNUSUAL BRUISING OR BLEEDING  TENDERNESS IN MOUTH AND THROAT WITH OR WITHOUT PRESENCE OF ULCERS  *URINARY PROBLEMS  *BOWEL PROBLEMS  UNUSUAL RASH Items with * indicate a potential emergency and should be followed up as soon as possible.  Feel free to call the clinic you have any questions or concerns. The clinic phone number is [phone].    

## 2013-10-17 ENCOUNTER — Other Ambulatory Visit: Payer: Self-pay | Admitting: Oncology

## 2013-10-17 DIAGNOSIS — C221 Intrahepatic bile duct carcinoma: Secondary | ICD-10-CM

## 2013-10-18 ENCOUNTER — Ambulatory Visit (HOSPITAL_BASED_OUTPATIENT_CLINIC_OR_DEPARTMENT_OTHER): Payer: 59

## 2013-10-18 ENCOUNTER — Telehealth: Payer: Self-pay | Admitting: Oncology

## 2013-10-18 ENCOUNTER — Other Ambulatory Visit (HOSPITAL_BASED_OUTPATIENT_CLINIC_OR_DEPARTMENT_OTHER): Payer: 59

## 2013-10-18 ENCOUNTER — Ambulatory Visit (HOSPITAL_BASED_OUTPATIENT_CLINIC_OR_DEPARTMENT_OTHER): Payer: 59 | Admitting: Oncology

## 2013-10-18 ENCOUNTER — Encounter: Payer: Self-pay | Admitting: Oncology

## 2013-10-18 VITALS — BP 125/72 | HR 80 | Temp 97.5°F | Resp 16 | Ht 65.0 in | Wt 176.6 lb

## 2013-10-18 DIAGNOSIS — Z5111 Encounter for antineoplastic chemotherapy: Secondary | ICD-10-CM

## 2013-10-18 DIAGNOSIS — K5900 Constipation, unspecified: Secondary | ICD-10-CM

## 2013-10-18 DIAGNOSIS — C221 Intrahepatic bile duct carcinoma: Secondary | ICD-10-CM

## 2013-10-18 DIAGNOSIS — C787 Secondary malignant neoplasm of liver and intrahepatic bile duct: Secondary | ICD-10-CM

## 2013-10-18 DIAGNOSIS — C801 Malignant (primary) neoplasm, unspecified: Secondary | ICD-10-CM

## 2013-10-18 LAB — CBC WITH DIFFERENTIAL/PLATELET
BASO%: 2 % (ref 0.0–2.0)
Basophils Absolute: 0.1 10*3/uL (ref 0.0–0.1)
EOS ABS: 0 10*3/uL (ref 0.0–0.5)
EOS%: 0.5 % (ref 0.0–7.0)
HCT: 34.8 % (ref 34.8–46.6)
HEMOGLOBIN: 11.6 g/dL (ref 11.6–15.9)
LYMPH#: 1.4 10*3/uL (ref 0.9–3.3)
LYMPH%: 36.3 % (ref 14.0–49.7)
MCH: 32.9 pg (ref 25.1–34.0)
MCHC: 33.3 g/dL (ref 31.5–36.0)
MCV: 98.8 fL (ref 79.5–101.0)
MONO#: 0.4 10*3/uL (ref 0.1–0.9)
MONO%: 10.2 % (ref 0.0–14.0)
NEUT%: 51 % (ref 38.4–76.8)
NEUTROS ABS: 2 10*3/uL (ref 1.5–6.5)
PLATELETS: 257 10*3/uL (ref 145–400)
RBC: 3.52 10*6/uL — ABNORMAL LOW (ref 3.70–5.45)
RDW: 16 % — ABNORMAL HIGH (ref 11.2–14.5)
WBC: 3.8 10*3/uL — AB (ref 3.9–10.3)

## 2013-10-18 LAB — COMPREHENSIVE METABOLIC PANEL (CC13)
ALT: 66 U/L — ABNORMAL HIGH (ref 0–55)
ANION GAP: 10 meq/L (ref 3–11)
AST: 49 U/L — ABNORMAL HIGH (ref 5–34)
Albumin: 4 g/dL (ref 3.5–5.0)
Alkaline Phosphatase: 59 U/L (ref 40–150)
BUN: 12.7 mg/dL (ref 7.0–26.0)
CO2: 27 meq/L (ref 22–29)
CREATININE: 0.8 mg/dL (ref 0.6–1.1)
Calcium: 10 mg/dL (ref 8.4–10.4)
Chloride: 99 mEq/L (ref 98–109)
GLUCOSE: 101 mg/dL (ref 70–140)
Potassium: 3.4 mEq/L — ABNORMAL LOW (ref 3.5–5.1)
Sodium: 136 mEq/L (ref 136–145)
Total Bilirubin: 0.45 mg/dL (ref 0.20–1.20)
Total Protein: 7.3 g/dL (ref 6.4–8.3)

## 2013-10-18 MED ORDER — SODIUM CHLORIDE 0.9 % IV SOLN
Freq: Once | INTRAVENOUS | Status: AC
  Administered 2013-10-18: 11:00:00 via INTRAVENOUS

## 2013-10-18 MED ORDER — HEPARIN SOD (PORK) LOCK FLUSH 100 UNIT/ML IV SOLN
500.0000 [IU] | Freq: Once | INTRAVENOUS | Status: AC | PRN
  Administered 2013-10-18: 500 [IU]
  Filled 2013-10-18: qty 5

## 2013-10-18 MED ORDER — SODIUM CHLORIDE 0.9 % IJ SOLN
10.0000 mL | INTRAMUSCULAR | Status: DC | PRN
  Administered 2013-10-18: 10 mL
  Filled 2013-10-18: qty 10

## 2013-10-18 MED ORDER — PROCHLORPERAZINE MALEATE 10 MG PO TABS
10.0000 mg | ORAL_TABLET | Freq: Once | ORAL | Status: AC
  Administered 2013-10-18: 10 mg via ORAL

## 2013-10-18 MED ORDER — PROCHLORPERAZINE MALEATE 10 MG PO TABS
ORAL_TABLET | ORAL | Status: AC
  Filled 2013-10-18: qty 1

## 2013-10-18 MED ORDER — SODIUM CHLORIDE 0.9 % IV SOLN
1000.0000 mg/m2 | Freq: Once | INTRAVENOUS | Status: AC
  Administered 2013-10-18: 1938 mg via INTRAVENOUS
  Filled 2013-10-18: qty 50.97

## 2013-10-18 MED ORDER — POLYETHYLENE GLYCOL 3350 17 GM/SCOOP PO POWD: 1.0000 | Freq: Once | ORAL | Status: DC

## 2013-10-18 NOTE — Progress Notes (Signed)
Hematology and Oncology Follow Up Visit  Lindsay Aguirre [id number] [birth year] 63 y.o. 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 This was confirmed by biopsy on 08/14/2013 and imaging studies showed multifocal hepatic lesions.   Prior Therapy: Status post a liver biopsy on 08/14/2013.  Current therapy: Gemcitabine at 1000 mg per meter squared given weekly 3 weeks on and one week off. Today is day 15 of cycle 2.  Interim History: Lindsay Aguirre presents for followup and evaluation by herself. She is on systemic chemotherapy with weekly gemcitabine and is tolerating chemotherapy relatively well. She did report grade 1 fatigue, and grade 1 nausea. Which was manageable with anti-emetics. She continues to report constipation for which she rakes Align and stool softener without really any benefit at this time. She reports that she continues to move her bowels once every 3 days and they are hard. She is not reporting any abdominal pain or hematochezia. Is not reporting any vomiting with her mild nausea. She reports no fever, chills, shortness of breath chest pain, night sweats or diarrhea.  Has not reported any hematochezia or melena. Has not reported any decline in her energy level or performance status. She continues to perform his activities of daily living without any decline. She has not reported any syncope or neurological changes. She has not reported any arthralgias or myalgias. Did not report any skin rashes or lesions. Did not report any lymphadenopathy or petechiae. She had no infusion-related complications with chemotherapy. Quality of life remains about the same Rest or view of system is unremarkable.  Medications: I have reviewed the patient's current medications. Unchanged by my review. Current Outpatient Prescriptions  Medication Sig Dispense  Refill  . allopurinol (ZYLOPRIM) 300 MG tablet Take 1 tablet (300 mg total) by mouth daily.  30 tablet  5  . bifidobacterium infantis (ALIGN) capsule Take 1 capsule by mouth daily.      [name] BIOTIN PO Take 1 tablet by mouth daily.      . Cholecalciferol (VITAMIN D) 2000 UNITS tablet Take 2,000 Units by mouth daily.      [name] Sodium (COLACE PO) Take 3 capsules by mouth daily.      [name] HYDROcodone-acetaminophen (NORCO/VICODIN) 5-325 MG per tablet Take 1 tablet by mouth every 6 (six) hours as needed for moderate pain.  10 tablet  0  . levothyroxine (SYNTHROID, LEVOTHROID) 88 MCG tablet Take 88 mcg by mouth daily before breakfast.      . lidocaine-prilocaine (EMLA) cream Apply 1 application topically as needed. Apply cream, on days of treatment, to [facility] site 1-2 hours prior to treatment.  30 g  0  . lisinopril-hydrochlorothiazide (PRINZIDE,ZESTORETIC) 20-12.5 MG per tablet Take 1 tablet by mouth every evening.      . ondansetron (ZOFRAN) 8 MG tablet Take 1 tablet (8 mg total) by mouth every 8 (eight) hours as needed for nausea or vomiting.  30 tablet  0  . senna (SENOKOT) 8.6 MG TABS tablet Take 2 tablets (17.2 mg total) by mouth daily.  60 each  0  . vitamin C (ASCORBIC ACID) 500 MG tablet Take 500 mg by mouth daily.      . polyethylene glycol powder (MIRALAX) powder Take 255 g (1 Container total) by mouth once.  255 g  1   No current facility-administered medications for this visit.     Allergies:  Allergies  Allergen Reactions  . Livalo [Pitavastatin]     Leg cramps  . Simvastatin     Leg cramps  . Statins     Leg Cramps     Past Medical History, Surgical history, Social history, and Family History were reviewed and updated.  Physical Exam: Blood pressure 125/72, pulse 80, temperature 97.5 F (36.4 C), temperature source Oral, resp. rate 16, height 5\' 5"  (1.651 m), weight 176 lb 9.6 oz (80.105 kg). ECOG: 0 General appearance: alert and cooperative Head: Normocephalic, without  obvious abnormality Neck: no adenopathy, supple, symmetrical, trachea midline and No thyroid masses. Lymph nodes: Cervical, supraclavicular, and axillary nodes normal. Heart:regular rate and rhythm, S1, S2.  Lung:chest clear, no wheezing. Dullness to percussion. Abdomin: soft, non-tender, without masses or organomegaly EXT:no erythema, induration, or edema Skin: Slight erythema noted under her left armpit.  Lab Results: Lab Results  Component Value Date   WBC 3.8* 10/18/2013   HGB 11.6 10/18/2013   HCT 34.8 10/18/2013   MCV 98.8 10/18/2013   PLT 257 10/18/2013     Chemistry      Component Value Date/Time   NA 136 10/18/2013 0911   NA 136 08/07/2013 1430   NA 126* 07/11/2013 0610   K 3.4* 10/18/2013 0911   K 4.7 08/07/2013 1430   CL 92* 08/07/2013 1430   CO2 27 10/18/2013 0911   CO2 27 08/07/2013 1430   BUN 12.7 10/18/2013 0911   BUN 12 08/07/2013 1430   BUN 11 07/11/2013 0610   CREATININE 0.8 10/18/2013 0911   CREATININE 1.00 08/07/2013 1430   CREATININE 0.81 10/24/2012 1203      Component Value Date/Time   CALCIUM 10.0 10/18/2013 0911   CALCIUM 10.8* 08/07/2013 1430   ALKPHOS 59 10/18/2013 0911   ALKPHOS 60 08/07/2013 1430   AST 49* 10/18/2013 0911   AST 19 08/07/2013 1430   ALT 66* 10/18/2013 0911   ALT 19 08/07/2013 1430   BILITOT 0.45 10/18/2013 0911   BILITOT 0.4 08/07/2013 1430       Impression and Plan:   63 year old woman with the following issues:  1. 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 She is on chemotherapy with gemcitabine 1000 mg day 1, 8 and 15 of a 28 day cycle. She is tolerating the chemotherapy relatively well with the exception of some constipation. He is ready to proceed with day 15 today of cycle 2 today. She will have a repeat CT scan on 10/29/2013 which will be arranged for today.  2. IV access: Status post Port-A-Cath placement on 09/03/2013. No complications noted per her report.  3. Antiemetics: Continue  Zofran as needed   4. Constipation: This is exacerbated by systemic chemotherapy. I gave her prescription for MiraLAX to add to her regimen.  5. Followup: On 10/30/2013 to discuss the results of the CT scan.  [facility], MD 6/19/20159:54 AM

## 2013-10-18 NOTE — Telephone Encounter (Signed)
gv and printed appt sched and avs for pt for June and July....gv pt barium °

## 2013-10-18 NOTE — Patient Instructions (Signed)
[facility] Discharge Instructions for Patients Receiving Chemotherapy  Today you received the following chemotherapy agent: Gemzar   To help prevent nausea and vomiting after your treatment, we encourage you to take your nausea medication as prescribed.    If you develop nausea and vomiting that is not controlled by your nausea medication, call the clinic.   BELOW ARE SYMPTOMS THAT SHOULD BE REPORTED IMMEDIATELY:  *FEVER GREATER THAN 100.5 F  *CHILLS WITH OR WITHOUT FEVER  NAUSEA AND VOMITING THAT IS NOT CONTROLLED WITH YOUR NAUSEA MEDICATION  *UNUSUAL SHORTNESS OF BREATH  *UNUSUAL BRUISING OR BLEEDING  TENDERNESS IN MOUTH AND THROAT WITH OR WITHOUT PRESENCE OF ULCERS  *URINARY PROBLEMS  *BOWEL PROBLEMS  UNUSUAL RASH Items with * indicate a potential emergency and should be followed up as soon as possible.  Feel free to call the clinic you have any questions or concerns. The clinic phone number is [phone].    

## 2013-10-27 ENCOUNTER — Other Ambulatory Visit: Payer: Self-pay | Admitting: Nurse Practitioner

## 2013-10-29 ENCOUNTER — Encounter (HOSPITAL_COMMUNITY): Payer: Self-pay

## 2013-10-29 ENCOUNTER — Other Ambulatory Visit (HOSPITAL_BASED_OUTPATIENT_CLINIC_OR_DEPARTMENT_OTHER): Payer: 59

## 2013-10-29 ENCOUNTER — Ambulatory Visit (HOSPITAL_COMMUNITY)
Admission: RE | Admit: 2013-10-29 | Discharge: 2013-10-29 | Disposition: A | Discharge status: Home / Self Care | Payer: 59 | Source: Ambulatory Visit | Attending: Oncology | Admitting: Oncology

## 2013-10-29 DIAGNOSIS — K7689 Other specified diseases of liver: Secondary | ICD-10-CM | POA: Insufficient documentation

## 2013-10-29 DIAGNOSIS — Z981 Arthrodesis status: Secondary | ICD-10-CM | POA: Insufficient documentation

## 2013-10-29 DIAGNOSIS — R918 Other nonspecific abnormal finding of lung field: Secondary | ICD-10-CM | POA: Insufficient documentation

## 2013-10-29 DIAGNOSIS — C221 Intrahepatic bile duct carcinoma: Secondary | ICD-10-CM

## 2013-10-29 DIAGNOSIS — C801 Malignant (primary) neoplasm, unspecified: Secondary | ICD-10-CM

## 2013-10-29 DIAGNOSIS — I2510 Atherosclerotic heart disease of native coronary artery without angina pectoris: Secondary | ICD-10-CM | POA: Insufficient documentation

## 2013-10-29 DIAGNOSIS — C787 Secondary malignant neoplasm of liver and intrahepatic bile duct: Secondary | ICD-10-CM

## 2013-10-29 LAB — CBC WITH DIFFERENTIAL/PLATELET
BASO%: 0.5 % (ref 0.0–2.0)
Basophils Absolute: 0 10*3/uL (ref 0.0–0.1)
EOS%: 1.8 % (ref 0.0–7.0)
Eosinophils Absolute: 0.1 10*3/uL (ref 0.0–0.5)
HCT: 34.2 % — ABNORMAL LOW (ref 34.8–46.6)
HGB: 11.5 g/dL — ABNORMAL LOW (ref 11.6–15.9)
LYMPH%: 18.1 % (ref 14.0–49.7)
MCH: 33.8 pg (ref 25.1–34.0)
MCHC: 33.7 g/dL (ref 31.5–36.0)
MCV: 100.3 fL (ref 79.5–101.0)
MONO#: 0.8 10*3/uL (ref 0.1–0.9)
MONO%: 10.5 % (ref 0.0–14.0)
NEUT#: 5.2 10*3/uL (ref 1.5–6.5)
NEUT%: 69.1 % (ref 38.4–76.8)
Platelets: 274 10*3/uL (ref 145–400)
RBC: 3.41 10*6/uL — ABNORMAL LOW (ref 3.70–5.45)
RDW: 18.3 % — AB (ref 11.2–14.5)
WBC: 7.6 10*3/uL (ref 3.9–10.3)
lymph#: 1.4 10*3/uL (ref 0.9–3.3)

## 2013-10-29 LAB — COMPREHENSIVE METABOLIC PANEL (CC13)
ALT: 42 U/L (ref 0–55)
AST: 25 U/L (ref 5–34)
Albumin: 4 g/dL (ref 3.5–5.0)
Alkaline Phosphatase: 61 U/L (ref 40–150)
Anion Gap: 10 mEq/L (ref 3–11)
BUN: 11.7 mg/dL (ref 7.0–26.0)
CALCIUM: 9.8 mg/dL (ref 8.4–10.4)
CHLORIDE: 102 meq/L (ref 98–109)
CO2: 26 mEq/L (ref 22–29)
Creatinine: 0.9 mg/dL (ref 0.6–1.1)
Glucose: 97 mg/dl (ref 70–140)
POTASSIUM: 3.7 meq/L (ref 3.5–5.1)
Sodium: 138 mEq/L (ref 136–145)
Total Bilirubin: 0.37 mg/dL (ref 0.20–1.20)
Total Protein: 7.2 g/dL (ref 6.4–8.3)

## 2013-10-29 MED ORDER — IOHEXOL 300 MG/ML  SOLN
100.0000 mL | Freq: Once | INTRAMUSCULAR | Status: AC | PRN
Start: 2013-10-29 — End: 2013-10-29
  Administered 2013-10-29: 100 mL via INTRAVENOUS

## 2013-10-30 ENCOUNTER — Telehealth: Payer: Self-pay | Admitting: *Deleted

## 2013-10-30 ENCOUNTER — Encounter: Payer: Self-pay | Admitting: Oncology

## 2013-10-30 ENCOUNTER — Ambulatory Visit (HOSPITAL_BASED_OUTPATIENT_CLINIC_OR_DEPARTMENT_OTHER): Payer: 59 | Admitting: Oncology

## 2013-10-30 ENCOUNTER — Telehealth: Payer: Self-pay | Admitting: Oncology

## 2013-10-30 VITALS — BP 143/73 | HR 91 | Temp 97.8°F | Resp 18 | Ht 65.0 in | Wt 177.6 lb

## 2013-10-30 DIAGNOSIS — C787 Secondary malignant neoplasm of liver and intrahepatic bile duct: Secondary | ICD-10-CM

## 2013-10-30 DIAGNOSIS — K5900 Constipation, unspecified: Secondary | ICD-10-CM

## 2013-10-30 DIAGNOSIS — C801 Malignant (primary) neoplasm, unspecified: Secondary | ICD-10-CM

## 2013-10-30 DIAGNOSIS — C221 Intrahepatic bile duct carcinoma: Secondary | ICD-10-CM

## 2013-10-30 NOTE — Telephone Encounter (Signed)
Per staff message and POF I have scheduled appts. Advised scheduler of appts. JMW  

## 2013-10-30 NOTE — Progress Notes (Signed)
Hematology and Oncology Follow Up Visit  Lindsay Aguirre [id number] May 11, 1950 63 y.o. 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 This was confirmed by biopsy on 08/14/2013 and imaging studies showed multifocal hepatic lesions.   Prior Therapy: Status post a liver biopsy on 08/14/2013.  Current therapy: Gemcitabine at 1000 mg per meter squared given weekly 3 weeks on and one week off. She is status post 2 cycles.  Interim History: Lindsay Aguirre presents for followup and evaluation with her daughter appeared She is status post 2 cycles of chemotherapy with weekly gemcitabine and is tolerating chemotherapy relatively well. She did report grade 1 fatigue, and grade 1 nausea that occurs about 2 days after chemotherapy.. Which was manageable with anti-emetics. She continues to report constipation which has improved. She is not reporting any abdominal pain or hematochezia. Is not reporting any vomiting with her mild nausea. She reports no fever, chills, shortness of breath chest pain, night sweats or diarrhea.  Has not reported any hematochezia or melena. Has not reported any decline in her energy level or performance status. She continues to perform his activities of daily living without any decline. She has not reported any syncope or neurological changes. She has not reported any arthralgias or myalgias. Did not report any skin rashes or lesions. Did not report any lymphadenopathy or petechiae. She had no infusion-related complications with chemotherapy. Quality of life remains about the same Rest or view of system is unremarkable.  Medications: I have reviewed the patient's current medications. Unchanged by my review. Current Outpatient Prescriptions  Medication Sig Dispense Refill  . allopurinol (ZYLOPRIM) 300 MG tablet Take 1 tablet (300 mg total) by  mouth daily.  30 tablet  5  . bifidobacterium infantis (ALIGN) capsule Take 1 capsule by mouth daily.      [name] BIOTIN PO Take 1 tablet by mouth daily.      . Cholecalciferol (VITAMIN D) 2000 UNITS tablet Take 2,000 Units by mouth daily.      [name] Sodium (COLACE PO) Take 3 capsules by mouth daily.      [name] HYDROcodone-acetaminophen (NORCO/VICODIN) 5-325 MG per tablet Take 1 tablet by mouth every 6 (six) hours as needed for moderate pain.  10 tablet  0  . levothyroxine (SYNTHROID, LEVOTHROID) 88 MCG tablet Take 88 mcg by mouth daily before breakfast.      . lidocaine-prilocaine (EMLA) cream Apply 1 application topically as needed. Apply cream, on days of treatment, to [facility] site 1-2 hours prior to treatment.  30 g  0  . lisinopril-hydrochlorothiazide (PRINZIDE,ZESTORETIC) 20-12.5 MG per tablet Take 1 tablet by mouth every evening.      . ondansetron (ZOFRAN) 8 MG tablet Take 1 tablet (8 mg total) by mouth every 8 (eight) hours as needed for nausea or vomiting.  30 tablet  0  . polyethylene glycol powder (MIRALAX) powder Take 255 g (1 Container total) by mouth once.  255 g  1  . senna (SENOKOT) 8.6 MG TABS tablet Take 2 tablets (17.2 mg total) by mouth daily.  60 each  0  . SYNTHROID 88 MCG tablet TAKE 1 TABLET (88 MCG TOTAL) BY MOUTH DAILY BEFORE BREAKFAST.  30 tablet  9  . vitamin C (ASCORBIC ACID) 500 MG tablet Take 500 mg by mouth daily.       No current facility-administered medications for this visit.  Allergies:  Allergies  Allergen Reactions  . Livalo [Pitavastatin]     Leg cramps  . Simvastatin     Leg cramps  . Statins     Leg Cramps     Past Medical History, Surgical history, Social history, and Family History were reviewed and updated.  Physical Exam: Blood pressure 143/73, pulse 91, temperature 97.8 F (36.6 C), temperature source Oral, resp. rate 18, height 5\' 5"  (1.651 m), weight 177 lb 9.6 oz (80.559 kg). ECOG: 0 General appearance: alert and cooperative Head:  Normocephalic, without obvious abnormality Neck: no adenopathy Lymph nodes: Cervical, supraclavicular, and axillary nodes normal. Heart:regular rate and rhythm, S1, S2.  Lung:chest clear, no wheezing. Dullness to percussion. Abdomin: soft, non-tender, without masses or organomegaly EXT:no erythema, induration, or edema Skin: No rashes or lesions.  Lab Results: Lab Results  Component Value Date   WBC 7.6 10/29/2013   HGB 11.5* 10/29/2013   HCT 34.2* 10/29/2013   MCV 100.3 10/29/2013   PLT 274 10/29/2013     Chemistry      Component Value Date/Time   NA 138 10/29/2013 0929   NA 136 08/07/2013 1430   NA 126* 07/11/2013 0610   K 3.7 10/29/2013 0929   K 4.7 08/07/2013 1430   CL 92* 08/07/2013 1430   CO2 26 10/29/2013 0929   CO2 27 08/07/2013 1430   BUN 11.7 10/29/2013 0929   BUN 12 08/07/2013 1430   BUN 11 07/11/2013 0610   CREATININE 0.9 10/29/2013 0929   CREATININE 1.00 08/07/2013 1430   CREATININE 0.81 10/24/2012 1203      Component Value Date/Time   CALCIUM 9.8 10/29/2013 0929   CALCIUM 10.8* 08/07/2013 1430   ALKPHOS 61 10/29/2013 0929   ALKPHOS 60 08/07/2013 1430   AST 25 10/29/2013 0929   AST 19 08/07/2013 1430   ALT 42 10/29/2013 0929   ALT 19 08/07/2013 1430   BILITOT 0.37 10/29/2013 0929   BILITOT 0.4 08/07/2013 1430     EXAM:  CT CHEST, ABDOMEN, AND PELVIS WITH CONTRAST  TECHNIQUE:  Multidetector CT imaging of the chest, abdomen and pelvis was  performed following the standard protocol during bolus  administration of intravenous contrast.  CONTRAST: 158mL OMNIPAQUE IOHEXOL 300 MG/ML SOLN  COMPARISON: Chest CT 08/12/2013. Abdomen and pelvic CT 06/26/2013.  FINDINGS:  CT CHEST FINDINGS  3 mm right middle lobe nodule on image 30 is stable. 3 mm right  lower lobe nodule is seen also on image 30 and is stable. Tiny  subpleural left upper lobe nodule on image 16 is stable. 5 mm nodule  in the left lower lobe on image 42 is stable. Small left lower lobe  nodule on image 43 is stable. No new or  enlarging pulmonary nodules.  No pleural effusions.  Stable 6 mm left supraclavicular lymph node, not pathologically  enlarged. No mediastinal, hilar, or axillary adenopathy. Heart is  normal size. Aorta is normal caliber. Scattered coronary artery  calcifications and descending aortic calcifications.  Right Port-A-Cath is in place with the tip in the SVC. Chest wall  soft tissues are unremarkable.  No acute bony abnormality or focal bone lesion.  CT ABDOMEN AND PELVIS FINDINGS  Numerous enhancing lesions within the liver. 2.3 cm lesion in the  anterior left hepatic lobe on image 49. This area was visualized on  prior chest CT from April and measured approximately 2.2 cm,  unchanged. Large irregular mass within the right hepatic lobe  measures 8.6 x 5.7 cm compared with  8.7 x 6.0 cm on prior MRI, not  significantly changed. Lesion in the adjacent left hepatic lobe also  on image 68 measures 3.5 x 2.3 cm, slightly larger. This previously  measured 3.2 x 2.1 cm on prior MRI. Several other smaller focal  lesions. Lesion on image 59 centrally within the right hepatic lobe  appears slightly larger measuring up to 2.5 cm compared with 1.7 cm  on prior MRI. Multiple left hepatic lesions appear larger with index  lesion on image 60 in the lateral segment measuring up to 1.9 cm  compared with 10 mm on prior MRI.  No focal splenic lesion. Pancreas, adrenals and kidneys are  unremarkable.  The large hepatic mass extends to the gallbladder fossa where there  appears to be wall thickening within the adjacent anterior  gallbladder. Cannot exclude direct involvement/ invasion.  Stomach, large and small bowel are unremarkable. No free fluid, free  air or adenopathy. Uterus, adnexae and urinary bladder grossly  unremarkable. Small amount of gas within the lumen of the bladder,  presumably from recent catheterization. Recommend clinical  correlation. Appendix is visualized and is normal. Aorta is  normal  caliber.  No acute bony abnormality or focal bone lesion. Changes from  posterior fusion in the lumbar spine.  IMPRESSION:  Numerous hepatic lesions are again noted. The largest lesion within  the right hepatic lobe is stable in overall size. There is adjacent  anterior gallbladder wall thickening now noted, new since prior  study. Cannot exclude direct involvement/ invasion.  Numerous other hepatic lesions, some of which are stable while  others have enlarged since prior study.  Multiple small pulmonary nodules are stable. No convincing evidence  for metastatic disease in the chest.    Impression and Plan:   63 year old woman with the following issues:  1. 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 She is on chemotherapy with gemcitabine 1000 mg day 1, 8 and 15 of a 28 day cycle. After 2 cycles of chemotherapy, she completed a CT scan on 10/29/2013. Results were discussed today and showed for the most part stable disease. I gave her the patient is continuing the same regimen, stopping chemotherapy or adding a doublet chemotherapy. Risks and benefits of all these approaches were discussed and we have agreed to proceed with 2 more cycles of chemotherapy and repeat CT scans. I anticipate doing a CT scan towards the end of August.  2. IV access: Status post Port-A-Cath placement on 09/03/2013. No complications noted per her report.   3. Antiemetics: Continue Zofran as needed   4. Constipation: Seems to be better today but I will continue to address with her.  5. Followup: She will return for for the beginning of cycle 3 of chemotherapy on 11/07/2013. She'll also be evaluated on 11/21/2013 before her day 15 of the third cycle.  [facility], MD 7/1/201512:10 PM

## 2013-10-30 NOTE — Telephone Encounter (Signed)
, °

## 2013-11-04 ENCOUNTER — Telehealth: Payer: Self-pay | Admitting: Oncology

## 2013-11-04 NOTE — Telephone Encounter (Signed)
, °

## 2013-11-06 ENCOUNTER — Ambulatory Visit: Payer: 59 | Admitting: Nurse Practitioner

## 2013-11-07 ENCOUNTER — Ambulatory Visit: Payer: 59 | Admitting: Nurse Practitioner

## 2013-11-07 ENCOUNTER — Ambulatory Visit: Payer: 59

## 2013-11-07 ENCOUNTER — Other Ambulatory Visit (HOSPITAL_BASED_OUTPATIENT_CLINIC_OR_DEPARTMENT_OTHER): Payer: 59

## 2013-11-07 ENCOUNTER — Ambulatory Visit (HOSPITAL_BASED_OUTPATIENT_CLINIC_OR_DEPARTMENT_OTHER): Payer: 59

## 2013-11-07 ENCOUNTER — Other Ambulatory Visit: Payer: 59

## 2013-11-07 VITALS — BP 108/59 | HR 82 | Temp 98.8°F | Resp 18

## 2013-11-07 DIAGNOSIS — C221 Intrahepatic bile duct carcinoma: Secondary | ICD-10-CM

## 2013-11-07 DIAGNOSIS — C787 Secondary malignant neoplasm of liver and intrahepatic bile duct: Secondary | ICD-10-CM

## 2013-11-07 DIAGNOSIS — Z95828 Presence of other vascular implants and grafts: Secondary | ICD-10-CM

## 2013-11-07 DIAGNOSIS — Z5111 Encounter for antineoplastic chemotherapy: Secondary | ICD-10-CM

## 2013-11-07 DIAGNOSIS — C801 Malignant (primary) neoplasm, unspecified: Secondary | ICD-10-CM

## 2013-11-07 LAB — COMPREHENSIVE METABOLIC PANEL (CC13)
ALT: 24 U/L (ref 0–55)
ANION GAP: 8 meq/L (ref 3–11)
AST: 24 U/L (ref 5–34)
Albumin: 4 g/dL (ref 3.5–5.0)
Alkaline Phosphatase: 63 U/L (ref 40–150)
BUN: 10.9 mg/dL (ref 7.0–26.0)
CALCIUM: 10 mg/dL (ref 8.4–10.4)
CHLORIDE: 103 meq/L (ref 98–109)
CO2: 28 meq/L (ref 22–29)
Creatinine: 0.8 mg/dL (ref 0.6–1.1)
GLUCOSE: 101 mg/dL (ref 70–140)
POTASSIUM: 3.5 meq/L (ref 3.5–5.1)
Sodium: 138 mEq/L (ref 136–145)
Total Bilirubin: 0.46 mg/dL (ref 0.20–1.20)
Total Protein: 7 g/dL (ref 6.4–8.3)

## 2013-11-07 LAB — CBC WITH DIFFERENTIAL/PLATELET
BASO%: 1.3 % (ref 0.0–2.0)
BASOS ABS: 0.1 10*3/uL (ref 0.0–0.1)
EOS%: 1.8 % (ref 0.0–7.0)
Eosinophils Absolute: 0.2 10*3/uL (ref 0.0–0.5)
HCT: 34.7 % — ABNORMAL LOW (ref 34.8–46.6)
HGB: 11.7 g/dL (ref 11.6–15.9)
LYMPH#: 1.3 10*3/uL (ref 0.9–3.3)
LYMPH%: 16.5 % (ref 14.0–49.7)
MCH: 34 pg (ref 25.1–34.0)
MCHC: 33.6 g/dL (ref 31.5–36.0)
MCV: 101 fL (ref 79.5–101.0)
MONO#: 0.7 10*3/uL (ref 0.1–0.9)
MONO%: 9.1 % (ref 0.0–14.0)
NEUT%: 71.3 % (ref 38.4–76.8)
NEUTROS ABS: 5.8 10*3/uL (ref 1.5–6.5)
Platelets: 558 10*3/uL — ABNORMAL HIGH (ref 145–400)
RBC: 3.43 10*6/uL — ABNORMAL LOW (ref 3.70–5.45)
RDW: 19.3 % — AB (ref 11.2–14.5)
WBC: 8.1 10*3/uL (ref 3.9–10.3)

## 2013-11-07 MED ORDER — HEPARIN SOD (PORK) LOCK FLUSH 100 UNIT/ML IV SOLN
500.0000 [IU] | Freq: Once | INTRAVENOUS | Status: AC | PRN
  Administered 2013-11-07: 500 [IU]
  Filled 2013-11-07: qty 5

## 2013-11-07 MED ORDER — SODIUM CHLORIDE 0.9 % IJ SOLN
10.0000 mL | INTRAMUSCULAR | Status: DC | PRN
  Administered 2013-11-07: 10 mL via INTRAVENOUS
  Filled 2013-11-07: qty 10

## 2013-11-07 MED ORDER — SODIUM CHLORIDE 0.9 % IV SOLN
Freq: Once | INTRAVENOUS | Status: AC
  Administered 2013-11-07: 13:00:00 via INTRAVENOUS

## 2013-11-07 MED ORDER — SODIUM CHLORIDE 0.9 % IV SOLN
1000.0000 mg/m2 | Freq: Once | INTRAVENOUS | Status: AC
  Administered 2013-11-07: 1938 mg via INTRAVENOUS
  Filled 2013-11-07: qty 51

## 2013-11-07 MED ORDER — PROCHLORPERAZINE MALEATE 10 MG PO TABS
10.0000 mg | ORAL_TABLET | Freq: Once | ORAL | Status: AC
  Administered 2013-11-07: 10 mg via ORAL

## 2013-11-07 MED ORDER — PROCHLORPERAZINE MALEATE 10 MG PO TABS
ORAL_TABLET | ORAL | Status: AC
  Filled 2013-11-07: qty 1

## 2013-11-07 MED ORDER — SODIUM CHLORIDE 0.9 % IJ SOLN
10.0000 mL | INTRAMUSCULAR | Status: DC | PRN
  Administered 2013-11-07: 10 mL
  Filled 2013-11-07: qty 10

## 2013-11-07 NOTE — Patient Instructions (Signed)

## 2013-11-07 NOTE — Patient Instructions (Signed)
[place] Discharge Instructions for Patients Receiving Chemotherapy  Today you received the following chemotherapy agents :  gemzar  To help prevent nausea and vomiting after your treatment, we encourage you to take your nausea medication.  If you develop nausea and vomiting that is not controlled by your nausea medication, call the clinic.   BELOW ARE SYMPTOMS THAT SHOULD BE REPORTED IMMEDIATELY:  *FEVER GREATER THAN 100.5 F  *CHILLS WITH OR WITHOUT FEVER  NAUSEA AND VOMITING THAT IS NOT CONTROLLED WITH YOUR NAUSEA MEDICATION  *UNUSUAL SHORTNESS OF BREATH  *UNUSUAL BRUISING OR BLEEDING  TENDERNESS IN MOUTH AND THROAT WITH OR WITHOUT PRESENCE OF ULCERS  *URINARY PROBLEMS  *BOWEL PROBLEMS  UNUSUAL RASH Items with * indicate a potential emergency and should be followed up as soon as possible.  Feel free to call the clinic you have any questions or concerns. The clinic phone number is [phone].

## 2013-11-14 ENCOUNTER — Ambulatory Visit (HOSPITAL_BASED_OUTPATIENT_CLINIC_OR_DEPARTMENT_OTHER): Payer: 59

## 2013-11-14 ENCOUNTER — Other Ambulatory Visit (HOSPITAL_BASED_OUTPATIENT_CLINIC_OR_DEPARTMENT_OTHER): Payer: 59

## 2013-11-14 VITALS — BP 123/57 | HR 73 | Temp 97.6°F | Resp 18

## 2013-11-14 DIAGNOSIS — C787 Secondary malignant neoplasm of liver and intrahepatic bile duct: Secondary | ICD-10-CM

## 2013-11-14 DIAGNOSIS — C221 Intrahepatic bile duct carcinoma: Secondary | ICD-10-CM

## 2013-11-14 DIAGNOSIS — C801 Malignant (primary) neoplasm, unspecified: Secondary | ICD-10-CM

## 2013-11-14 DIAGNOSIS — Z5111 Encounter for antineoplastic chemotherapy: Secondary | ICD-10-CM

## 2013-11-14 LAB — CBC WITH DIFFERENTIAL/PLATELET
BASO%: 2.3 % — ABNORMAL HIGH (ref 0.0–2.0)
BASOS ABS: 0.1 10*3/uL (ref 0.0–0.1)
EOS%: 1.7 % (ref 0.0–7.0)
Eosinophils Absolute: 0.1 10*3/uL (ref 0.0–0.5)
HCT: 33.1 % — ABNORMAL LOW (ref 34.8–46.6)
HEMOGLOBIN: 11 g/dL — AB (ref 11.6–15.9)
LYMPH%: 33.9 % (ref 14.0–49.7)
MCH: 33.7 pg (ref 25.1–34.0)
MCHC: 33.2 g/dL (ref 31.5–36.0)
MCV: 101.5 fL — AB (ref 79.5–101.0)
MONO#: 0.3 10*3/uL (ref 0.1–0.9)
MONO%: 7.3 % (ref 0.0–14.0)
NEUT#: 2.1 10*3/uL (ref 1.5–6.5)
NEUT%: 54.8 % (ref 38.4–76.8)
PLATELETS: 367 10*3/uL (ref 145–400)
RBC: 3.26 10*6/uL — ABNORMAL LOW (ref 3.70–5.45)
RDW: 18.4 % — ABNORMAL HIGH (ref 11.2–14.5)
WBC: 3.8 10*3/uL — ABNORMAL LOW (ref 3.9–10.3)
lymph#: 1.3 10*3/uL (ref 0.9–3.3)

## 2013-11-14 LAB — COMPREHENSIVE METABOLIC PANEL (CC13)
ALK PHOS: 72 U/L (ref 40–150)
ALT: 49 U/L (ref 0–55)
AST: 37 U/L — AB (ref 5–34)
Albumin: 4 g/dL (ref 3.5–5.0)
Anion Gap: 8 mEq/L (ref 3–11)
BILIRUBIN TOTAL: 0.38 mg/dL (ref 0.20–1.20)
BUN: 13.1 mg/dL (ref 7.0–26.0)
CO2: 27 mEq/L (ref 22–29)
Calcium: 10 mg/dL (ref 8.4–10.4)
Chloride: 102 mEq/L (ref 98–109)
Creatinine: 0.9 mg/dL (ref 0.6–1.1)
Glucose: 103 mg/dl (ref 70–140)
Potassium: 3.6 mEq/L (ref 3.5–5.1)
Sodium: 137 mEq/L (ref 136–145)
Total Protein: 7.2 g/dL (ref 6.4–8.3)

## 2013-11-14 MED ORDER — SODIUM CHLORIDE 0.9 % IV SOLN
Freq: Once | INTRAVENOUS | Status: AC
  Administered 2013-11-14: 14:00:00 via INTRAVENOUS

## 2013-11-14 MED ORDER — PROCHLORPERAZINE MALEATE 10 MG PO TABS
10.0000 mg | ORAL_TABLET | Freq: Once | ORAL | Status: AC
  Administered 2013-11-14: 10 mg via ORAL

## 2013-11-14 MED ORDER — SODIUM CHLORIDE 0.9 % IJ SOLN
10.0000 mL | INTRAMUSCULAR | Status: DC | PRN
  Administered 2013-11-14: 10 mL
  Filled 2013-11-14: qty 10

## 2013-11-14 MED ORDER — HEPARIN SOD (PORK) LOCK FLUSH 100 UNIT/ML IV SOLN
500.0000 [IU] | Freq: Once | INTRAVENOUS | Status: AC | PRN
  Administered 2013-11-14: 500 [IU]
  Filled 2013-11-14: qty 5

## 2013-11-14 MED ORDER — PROCHLORPERAZINE MALEATE 10 MG PO TABS
ORAL_TABLET | ORAL | Status: AC
  Filled 2013-11-14: qty 1

## 2013-11-14 MED ORDER — GEMCITABINE HCL CHEMO INJECTION 1 GM/26.3ML
1000.0000 mg/m2 | Freq: Once | INTRAVENOUS | Status: AC
  Administered 2013-11-14: 1938 mg via INTRAVENOUS
  Filled 2013-11-14: qty 51

## 2013-11-14 NOTE — Patient Instructions (Signed)
[facility] Discharge Instructions for Patients Receiving Chemotherapy  Today you received the following chemotherapy agents Gemzar.  To help prevent nausea and vomiting after your treatment, we encourage you to take your nausea medication.   If you develop nausea and vomiting that is not controlled by your nausea medication, call the clinic.   BELOW ARE SYMPTOMS THAT SHOULD BE REPORTED IMMEDIATELY:  *FEVER GREATER THAN 100.5 F  *CHILLS WITH OR WITHOUT FEVER  NAUSEA AND VOMITING THAT IS NOT CONTROLLED WITH YOUR NAUSEA MEDICATION  *UNUSUAL SHORTNESS OF BREATH  *UNUSUAL BRUISING OR BLEEDING  TENDERNESS IN MOUTH AND THROAT WITH OR WITHOUT PRESENCE OF ULCERS  *URINARY PROBLEMS  *BOWEL PROBLEMS  UNUSUAL RASH Items with * indicate a potential emergency and should be followed up as soon as possible.  Feel free to call the clinic you have any questions or concerns. The clinic phone number is [phone].    

## 2013-11-21 ENCOUNTER — Other Ambulatory Visit (HOSPITAL_BASED_OUTPATIENT_CLINIC_OR_DEPARTMENT_OTHER): Payer: 59

## 2013-11-21 ENCOUNTER — Ambulatory Visit (HOSPITAL_BASED_OUTPATIENT_CLINIC_OR_DEPARTMENT_OTHER): Payer: 59

## 2013-11-21 ENCOUNTER — Ambulatory Visit (HOSPITAL_BASED_OUTPATIENT_CLINIC_OR_DEPARTMENT_OTHER): Payer: 59 | Admitting: Physician Assistant

## 2013-11-21 ENCOUNTER — Encounter: Payer: Self-pay | Admitting: Physician Assistant

## 2013-11-21 VITALS — BP 131/68 | HR 77 | Temp 98.3°F | Resp 18 | Ht 65.0 in | Wt 177.7 lb

## 2013-11-21 DIAGNOSIS — C787 Secondary malignant neoplasm of liver and intrahepatic bile duct: Secondary | ICD-10-CM

## 2013-11-21 DIAGNOSIS — K5900 Constipation, unspecified: Secondary | ICD-10-CM

## 2013-11-21 DIAGNOSIS — C220 Liver cell carcinoma: Secondary | ICD-10-CM

## 2013-11-21 DIAGNOSIS — Z5111 Encounter for antineoplastic chemotherapy: Secondary | ICD-10-CM

## 2013-11-21 DIAGNOSIS — C801 Malignant (primary) neoplasm, unspecified: Secondary | ICD-10-CM

## 2013-11-21 DIAGNOSIS — C221 Intrahepatic bile duct carcinoma: Secondary | ICD-10-CM

## 2013-11-21 LAB — COMPREHENSIVE METABOLIC PANEL (CC13)
ALBUMIN: 3.9 g/dL (ref 3.5–5.0)
ALK PHOS: 66 U/L (ref 40–150)
ALT: 40 U/L (ref 0–55)
AST: 34 U/L (ref 5–34)
Anion Gap: 7 mEq/L (ref 3–11)
BUN: 10.5 mg/dL (ref 7.0–26.0)
CHLORIDE: 101 meq/L (ref 98–109)
CO2: 29 mEq/L (ref 22–29)
Calcium: 10 mg/dL (ref 8.4–10.4)
Creatinine: 0.8 mg/dL (ref 0.6–1.1)
Glucose: 93 mg/dl (ref 70–140)
POTASSIUM: 3.6 meq/L (ref 3.5–5.1)
Sodium: 138 mEq/L (ref 136–145)
Total Bilirubin: 0.45 mg/dL (ref 0.20–1.20)
Total Protein: 7.3 g/dL (ref 6.4–8.3)

## 2013-11-21 LAB — CBC WITH DIFFERENTIAL/PLATELET
BASO%: 1.1 % (ref 0.0–2.0)
BASOS ABS: 0 10*3/uL (ref 0.0–0.1)
EOS%: 1 % (ref 0.0–7.0)
Eosinophils Absolute: 0 10*3/uL (ref 0.0–0.5)
HCT: 32 % — ABNORMAL LOW (ref 34.8–46.6)
HEMOGLOBIN: 10.7 g/dL — AB (ref 11.6–15.9)
LYMPH%: 41.8 % (ref 14.0–49.7)
MCH: 33.9 pg (ref 25.1–34.0)
MCHC: 33.4 g/dL (ref 31.5–36.0)
MCV: 101.3 fL — ABNORMAL HIGH (ref 79.5–101.0)
MONO#: 0.4 10*3/uL (ref 0.1–0.9)
MONO%: 11.2 % (ref 0.0–14.0)
NEUT#: 1.7 10*3/uL (ref 1.5–6.5)
NEUT%: 44.9 % (ref 38.4–76.8)
Platelets: 181 10*3/uL (ref 145–400)
RBC: 3.16 10*6/uL — ABNORMAL LOW (ref 3.70–5.45)
RDW: 18.6 % — AB (ref 11.2–14.5)
WBC: 3.8 10*3/uL — ABNORMAL LOW (ref 3.9–10.3)
lymph#: 1.6 10*3/uL (ref 0.9–3.3)

## 2013-11-21 MED ORDER — POLYETHYLENE GLYCOL 3350 17 GM/SCOOP PO POWD: 1.0000 | Freq: Once | ORAL | Status: DC

## 2013-11-21 MED ORDER — HYDROCODONE-ACETAMINOPHEN 5-325 MG PO TABS: 1.0000 | ORAL_TABLET | Freq: Four times a day (QID) | ORAL | Status: DC | PRN

## 2013-11-21 MED ORDER — SODIUM CHLORIDE 0.9 % IV SOLN
1000.0000 mg/m2 | Freq: Once | INTRAVENOUS | Status: AC
  Administered 2013-11-21: 1938 mg via INTRAVENOUS
  Filled 2013-11-21: qty 50.97

## 2013-11-21 MED ORDER — HEPARIN SOD (PORK) LOCK FLUSH 100 UNIT/ML IV SOLN
500.0000 [IU] | Freq: Once | INTRAVENOUS | Status: AC | PRN
  Administered 2013-11-21: 500 [IU]
  Filled 2013-11-21: qty 5

## 2013-11-21 MED ORDER — PROCHLORPERAZINE MALEATE 10 MG PO TABS
10.0000 mg | ORAL_TABLET | Freq: Once | ORAL | Status: AC
  Administered 2013-11-21: 10 mg via ORAL

## 2013-11-21 MED ORDER — ONDANSETRON HCL 8 MG PO TABS: 8.0000 mg | ORAL_TABLET | Freq: Three times a day (TID) | ORAL | Status: DC | PRN

## 2013-11-21 MED ORDER — SODIUM CHLORIDE 0.9 % IV SOLN
Freq: Once | INTRAVENOUS | Status: AC
  Administered 2013-11-21: 15:00:00 via INTRAVENOUS

## 2013-11-21 MED ORDER — PROCHLORPERAZINE MALEATE 10 MG PO TABS
ORAL_TABLET | ORAL | Status: AC
  Filled 2013-11-21: qty 1

## 2013-11-21 MED ORDER — SODIUM CHLORIDE 0.9 % IJ SOLN
10.0000 mL | INTRAMUSCULAR | Status: DC | PRN
  Administered 2013-11-21: 10 mL
  Filled 2013-11-21: qty 10

## 2013-11-21 NOTE — Progress Notes (Signed)
Hematology and Oncology Follow Up Visit  Lindsay Aguirre [id number] [birth year] 63 y.o. 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 This was confirmed by biopsy on 08/14/2013 and imaging studies showed multifocal hepatic lesions.   Prior Therapy: Status post a liver biopsy on 08/14/2013.  Current therapy: Gemcitabine at 1000 mg per meter squared given weekly 3 weeks on and one week off. She is status post 2 cycles as well as days 1 and 8 of cycle #3.  Interim History: Lindsay Aguirre presents for followup and evaluation with her daughter appeared She is status post 2 cycles as well as days 1 and 8 of cycle #3 of chemotherapy with weekly gemcitabine and is tolerating chemotherapy relatively well. She continues to report grade 1 fatigue, and grade 1 nausea that occurs about 2 days after chemotherapy. Which was manageable with anti-emetics. She continues to report constipation which has improved and managed with Miralax. She is not reporting any abdominal pain or hematochezia. Is not reporting any vomiting with her mild nausea. She reports no fever, chills, shortness of breath chest pain, night sweats or diarrhea.  Denied any hematochezia or melena. Has not reported any decline in her energy level or performance status. She continues to perform his activities of daily living without any decline. She has not reported any syncope or neurological changes. She has not reported any arthralgias or myalgias. Did not report any skin rashes or lesions. Did not report any lymphadenopathy or petechiae. She had no infusion-related complications with chemotherapy. Quality of life remains about the same. Remainder of the review of systems is unremarkable.  Medications: I have reviewed the patient's current medications. Unchanged by my review. Current Outpatient Prescriptions   Medication Sig Dispense Refill  . allopurinol (ZYLOPRIM) 300 MG tablet Take 1 tablet (300 mg total) by mouth daily.  30 tablet  5  . bifidobacterium infantis (ALIGN) capsule Take 1 capsule by mouth daily.      [name] BIOTIN PO Take 1 tablet by mouth daily.      . Cholecalciferol (VITAMIN D) 2000 UNITS tablet Take 2,000 Units by mouth daily.      [name] Sodium (COLACE PO) Take 3 capsules by mouth daily.      [name] HYDROcodone-acetaminophen (NORCO/VICODIN) 5-325 MG per tablet Take 1 tablet by mouth every 6 (six) hours as needed for moderate pain.  30 tablet  0  . lidocaine-prilocaine (EMLA) cream Apply 1 application topically as needed. Apply cream, on days of treatment, to [facility] site 1-2 hours prior to treatment.  30 g  0  . lisinopril-hydrochlorothiazide (PRINZIDE,ZESTORETIC) 20-12.5 MG per tablet Take 1 tablet by mouth every evening.      . ondansetron (ZOFRAN) 8 MG tablet Take 1 tablet (8 mg total) by mouth every 8 (eight) hours as needed for nausea or vomiting.  30 tablet  0  . polyethylene glycol powder (MIRALAX) powder Take 255 g (1 Container total) by mouth once.  255 g  1  . senna (SENOKOT) 8.6 MG TABS tablet Take 2 tablets (17.2 mg total) by mouth daily.  60 each  0  . SYNTHROID 88 MCG tablet TAKE 1 TABLET (88 MCG TOTAL) BY MOUTH DAILY BEFORE BREAKFAST.  30 tablet  9  . vitamin C (ASCORBIC ACID) 500 MG tablet Take 500 mg by mouth daily.       No current facility-administered medications for this  visit.   Facility-Administered Medications Ordered in Other Visits  Medication Dose Route Frequency Provider Last Rate Last Dose  . sodium chloride 0.9 % injection 10 mL  10 mL Intracatheter PRN Wyatt Portela, MD   10 mL at 11/21/13 1608     Allergies:  Allergies  Allergen Reactions  . Livalo [Pitavastatin]     Leg cramps  . Simvastatin     Leg cramps  . Statins     Leg Cramps     Past Medical History, Surgical history, Social history, and Family History were reviewed and  updated.  Physical Exam: Blood pressure 131/68, pulse 77, temperature 98.3 F (36.8 C), temperature source Oral, resp. rate 18, height 5\' 5"  (1.651 m), weight 177 lb 11.2 oz (80.604 kg), SpO2 100.00%. ECOG: 0 General appearance: alert and cooperative Head: Normocephalic, without obvious abnormality Neck: no adenopathy Lymph nodes: Cervical, supraclavicular, and axillary nodes normal. Heart:regular rate and rhythm, S1, S2.  Lung:chest clear, no wheezing. Dullness to percussion. Abdomin: soft, non-tender, without masses or organomegaly EXT:no erythema, induration, or edema Skin: No rashes or lesions.  Lab Results: Lab Results  Component Value Date   WBC 3.8* 11/21/2013   HGB 10.7* 11/21/2013   HCT 32.0* 11/21/2013   MCV 101.3* 11/21/2013   PLT 181 11/21/2013     Chemistry      Component Value Date/Time   NA 138 11/21/2013 1308   NA 136 08/07/2013 1430   NA 126* 07/11/2013 0610   K 3.6 11/21/2013 1308   K 4.7 08/07/2013 1430   CL 92* 08/07/2013 1430   CO2 29 11/21/2013 1308   CO2 27 08/07/2013 1430   BUN 10.5 11/21/2013 1308   BUN 12 08/07/2013 1430   BUN 11 07/11/2013 0610   CREATININE 0.8 11/21/2013 1308   CREATININE 1.00 08/07/2013 1430   CREATININE 0.81 10/24/2012 1203      Component Value Date/Time   CALCIUM 10.0 11/21/2013 1308   CALCIUM 10.8* 08/07/2013 1430   ALKPHOS 66 11/21/2013 1308   ALKPHOS 60 08/07/2013 1430   AST 34 11/21/2013 1308   AST 19 08/07/2013 1430   ALT 40 11/21/2013 1308   ALT 19 08/07/2013 1430   BILITOT 0.45 11/21/2013 1308   BILITOT 0.4 08/07/2013 1430     EXAM:  CT CHEST, ABDOMEN, AND PELVIS WITH CONTRAST  TECHNIQUE:  Multidetector CT imaging of the chest, abdomen and pelvis was  performed following the standard protocol during bolus  administration of intravenous contrast.  CONTRAST: 134mL OMNIPAQUE IOHEXOL 300 MG/ML SOLN  COMPARISON: Chest CT 08/12/2013. Abdomen and pelvic CT 06/26/2013.  FINDINGS:  CT CHEST FINDINGS  3 mm right middle lobe nodule on image 30 is  stable. 3 mm right  lower lobe nodule is seen also on image 30 and is stable. Tiny  subpleural left upper lobe nodule on image 16 is stable. 5 mm nodule  in the left lower lobe on image 42 is stable. Small left lower lobe  nodule on image 43 is stable. No new or enlarging pulmonary nodules.  No pleural effusions.  Stable 6 mm left supraclavicular lymph node, not pathologically  enlarged. No mediastinal, hilar, or axillary adenopathy. Heart is  normal size. Aorta is normal caliber. Scattered coronary artery  calcifications and descending aortic calcifications.  Right Port-A-Cath is in place with the tip in the SVC. Chest wall  soft tissues are unremarkable.  No acute bony abnormality or focal bone lesion.  CT ABDOMEN AND PELVIS FINDINGS  Numerous enhancing lesions  within the liver. 2.3 cm lesion in the  anterior left hepatic lobe on image 49. This area was visualized on  prior chest CT from April and measured approximately 2.2 cm,  unchanged. Large irregular mass within the right hepatic lobe  measures 8.6 x 5.7 cm compared with 8.7 x 6.0 cm on prior MRI, not  significantly changed. Lesion in the adjacent left hepatic lobe also  on image 68 measures 3.5 x 2.3 cm, slightly larger. This previously  measured 3.2 x 2.1 cm on prior MRI. Several other smaller focal  lesions. Lesion on image 59 centrally within the right hepatic lobe  appears slightly larger measuring up to 2.5 cm compared with 1.7 cm  on prior MRI. Multiple left hepatic lesions appear larger with index  lesion on image 60 in the lateral segment measuring up to 1.9 cm  compared with 10 mm on prior MRI.  No focal splenic lesion. Pancreas, adrenals and kidneys are  unremarkable.  The large hepatic mass extends to the gallbladder fossa where there  appears to be wall thickening within the adjacent anterior  gallbladder. Cannot exclude direct involvement/ invasion.  Stomach, large and small bowel are unremarkable. No free fluid,  free  air or adenopathy. Uterus, adnexae and urinary bladder grossly  unremarkable. Small amount of gas within the lumen of the bladder,  presumably from recent catheterization. Recommend clinical  correlation. Appendix is visualized and is normal. Aorta is normal  caliber.  No acute bony abnormality or focal bone lesion. Changes from  posterior fusion in the lumbar spine.  IMPRESSION:  Numerous hepatic lesions are again noted. The largest lesion within  the right hepatic lobe is stable in overall size. There is adjacent  anterior gallbladder wall thickening now noted, new since prior  study. Cannot exclude direct involvement/ invasion.  Numerous other hepatic lesions, some of which are stable while  others have enlarged since prior study.  Multiple small pulmonary nodules are stable. No convincing evidence  for metastatic disease in the chest.    Impression and Plan:   63 year old woman with the following issues:  1. 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 She is on chemotherapy with gemcitabine 1000 mg day 1, 8 and 15 of a 28 day cycle. After 2 cycles of chemotherapy, she completed a CT scan on 10/29/2013. Results showed for the most part stable disease.She was given the option of continuing the same regimen, stopping chemotherapy or adding a doublet chemotherapy. Risks and benefits of all these approaches were discussed and it was agreed to proceed with 2 more cycles of chemotherapy and repeat CT scans.The plan is do a CT scan towards the end of August. She's completed days 1 and 8 of cycle #3 and we'll proceed with day 15 today as scheduled.  2. IV access: Status post Port-A-Cath placement on 09/03/2013. No complications noted per her report.   3. Antiemetics: Continue Zofran as needed -refill sent to her pharmacy of record via E. scribed  4. Constipation: Better controlled with MiraLAX and this is refilled for her at her  pharmacy of record via E. scribed.   5. Followup: She will return in 3 weeks for another symptom management visit.   Carlton Adam, PA-C  7/23/20155:17 PM

## 2013-11-21 NOTE — Patient Instructions (Signed)
[facility] Discharge Instructions for Patients Receiving Chemotherapy  Today you received the following chemotherapy agents Gemcitabine.   To help prevent nausea and vomiting after your treatment, we encourage you to take your nausea medication as directed.    If you develop nausea and vomiting that is not controlled by your nausea medication, call the clinic.   BELOW ARE SYMPTOMS THAT SHOULD BE REPORTED IMMEDIATELY:  *FEVER GREATER THAN 100.5 F  *CHILLS WITH OR WITHOUT FEVER  NAUSEA AND VOMITING THAT IS NOT CONTROLLED WITH YOUR NAUSEA MEDICATION  *UNUSUAL SHORTNESS OF BREATH  *UNUSUAL BRUISING OR BLEEDING  TENDERNESS IN MOUTH AND THROAT WITH OR WITHOUT PRESENCE OF ULCERS  *URINARY PROBLEMS  *BOWEL PROBLEMS  UNUSUAL RASH Items with * indicate a potential emergency and should be followed up as soon as possible.  Feel free to call the clinic you have any questions or concerns. The clinic phone number is [phone].    

## 2013-11-26 NOTE — Patient Instructions (Signed)
Continue labs and chemotherapy as scheduled Follow up on 12/19/2013

## 2013-12-02 ENCOUNTER — Ambulatory Visit (INDEPENDENT_AMBULATORY_CARE_PROVIDER_SITE_OTHER): Payer: 59 | Admitting: Nurse Practitioner

## 2013-12-02 ENCOUNTER — Encounter: Payer: Self-pay | Admitting: Nurse Practitioner

## 2013-12-02 VITALS — BP 103/61 | HR 79 | Temp 97.2°F | Ht 65.0 in | Wt 176.0 lb

## 2013-12-02 DIAGNOSIS — M109 Gout, unspecified: Secondary | ICD-10-CM

## 2013-12-02 DIAGNOSIS — K5730 Diverticulosis of large intestine without perforation or abscess without bleeding: Secondary | ICD-10-CM

## 2013-12-02 DIAGNOSIS — I10 Essential (primary) hypertension: Secondary | ICD-10-CM

## 2013-12-02 DIAGNOSIS — E039 Hypothyroidism, unspecified: Secondary | ICD-10-CM

## 2013-12-02 DIAGNOSIS — E785 Hyperlipidemia, unspecified: Secondary | ICD-10-CM

## 2013-12-02 DIAGNOSIS — C221 Intrahepatic bile duct carcinoma: Secondary | ICD-10-CM

## 2013-12-02 DIAGNOSIS — M1009 Idiopathic gout, multiple sites: Secondary | ICD-10-CM

## 2013-12-02 MED ORDER — LISINOPRIL-HYDROCHLOROTHIAZIDE 20-12.5 MG PO TABS: 1.0000 | ORAL_TABLET | Freq: Every evening | ORAL | Status: DC

## 2013-12-02 MED ORDER — SYNTHROID 88 MCG PO TABS: ORAL_TABLET | ORAL | Status: DC

## 2013-12-02 NOTE — Patient Instructions (Signed)

## 2013-12-02 NOTE — Progress Notes (Signed)
Subjective:    Patient ID: Lindsay Aguirre, female    DOB: 10/18/1950, 63 y.o.   MRN: [id number]  Patient here today for follow up of chronic medical problems. 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 She has 3  More chemo treatments and then will have scan repeated   Hyperlipidemia This is a chronic problem. The current episode started more than 1 year ago. The problem is uncontrolled. Recent lipid tests were reviewed and are high. Exacerbating diseases include hypothyroidism and obesity. There are no known factors aggravating her hyperlipidemia. Pertinent negatives include no chest pain, focal sensory loss, leg pain, myalgias or shortness of breath. Current antihyperlipidemic treatment includes statins. Improvement on treatment: will have to see when labs are back. Compliance problems include adherence to diet and adherence to exercise.  Risk factors for coronary artery disease include hypertension, obesity and post-menopausal.  Hypertension This is a chronic problem. The current episode started more than 1 year ago. The problem has been resolved since onset. The problem is controlled. Pertinent negatives include no blurred vision, chest pain, headaches, malaise/fatigue, palpitations, peripheral edema, shortness of breath or sweats. There are no associated agents to hypertension. Risk factors for coronary artery disease include dyslipidemia, obesity and post-menopausal state. Past treatments include ACE inhibitors and diuretics. The current treatment provides significant improvement. Compliance problems include diet and exercise.  Hypertensive end-organ damage includes a thyroid problem.  Thyroid Problem Presents for follow-up (hypothyroidism) visit. Patient reports no anxiety, cold intolerance, constipation, fatigue, hoarse voice, leg swelling, menstrual problem, palpitations, visual change, weight gain or weight loss. The symptoms have been  stable. Her past medical history is significant for hyperlipidemia.  GOUT Allopurinol- daily- Hasn't had a flare up in .6 years.    Review of Systems  Constitutional: Negative for weight loss, weight gain, malaise/fatigue and fatigue.  HENT: Negative for hoarse voice.   Eyes: Negative for blurred vision.  Respiratory: Negative for shortness of breath.   Cardiovascular: Negative for chest pain and palpitations.  Gastrointestinal: Negative for constipation.  Endocrine: Negative for cold intolerance.  Genitourinary: Negative for menstrual problem.  Musculoskeletal: Negative for myalgias.  Neurological: Negative for headaches.  All other systems reviewed and are negative.      Objective:   Physical Exam  Constitutional: She is oriented to person, place, and time. She appears well-developed and well-nourished.  HENT:  Nose: Nose normal.  Mouth/Throat: Oropharynx is clear and moist.  Eyes: EOM are normal.  Neck: Trachea normal, normal range of motion and full passive range of motion without pain. Neck supple. No JVD present. Carotid bruit is not present. No thyromegaly present.  Cardiovascular: Normal rate, regular rhythm, normal heart sounds and intact distal pulses.  Exam reveals no gallop and no friction rub.   No murmur heard. Pulmonary/Chest: Effort normal and breath sounds normal.  Abdominal: Soft. Bowel sounds are normal. She exhibits no distension and no mass. There is no tenderness.  Musculoskeletal: Normal range of motion.  Lymphadenopathy:    She has no cervical adenopathy.  Neurological: She is alert and oriented to person, place, and time. She has normal reflexes.  Skin: Skin is warm and dry.  Psychiatric: She has a normal mood and affect. Her behavior is normal. Judgment and thought content normal.     BP 103/61  Pulse 79  Temp(Src) 97.2 F (36.2 C) (Oral)  Ht 5' 5" (1.651 m)  Wt 176 lb (79.833 kg)  BMI 29.29 kg/m2        Assessment & Plan:   1.  Hypothyroidism, unspecified hypothyroidism type   2. Essential hypertension   3. Hyperlipidemia   4. Acute idiopathic gout of multiple sites   5. Diverticulosis of large intestine without hemorrhage   6. Cholangiocarcinoma    Orders Placed This Encounter  Procedures  . CMP14+EGFR  . NMR, lipoprofile  . Thyroid Panel With TSH   Meds ordered this encounter  Medications  . SYNTHROID 88 MCG tablet    Sig: TAKE 1 TABLET (88 MCG TOTAL) BY MOUTH DAILY BEFORE BREAKFAST.    Dispense:  30 tablet    Refill:  5    Order Specific Question:  Supervising Provider    Answer:  Chipper Herb [[id number]]  . lisinopril-hydrochlorothiazide (PRINZIDE,ZESTORETIC) 20-12.5 MG per tablet    Sig: Take 1 tablet by mouth every evening.    Dispense:  30 tablet    Refill:  5    Order Specific Question:  Supervising Provider    Answer:  Chipper Herb [[id number]]    Labs pending Health maintenance reviewed Diet and exercise encouraged Continue all meds Follow up  In 3 months   [place], FNP

## 2013-12-03 LAB — CMP14+EGFR
ALBUMIN: 4.5 g/dL (ref 3.6–4.8)
ALK PHOS: 67 IU/L (ref 39–117)
ALT: 47 IU/L — ABNORMAL HIGH (ref 0–32)
AST: 38 IU/L (ref 0–40)
Albumin/Globulin Ratio: 2 (ref 1.1–2.5)
BILIRUBIN TOTAL: 0.4 mg/dL (ref 0.0–1.2)
BUN / CREAT RATIO: 9 — AB (ref 11–26)
BUN: 9 mg/dL (ref 8–27)
CHLORIDE: 99 mmol/L (ref 97–108)
CO2: 26 mmol/L (ref 18–29)
Calcium: 10.1 mg/dL (ref 8.7–10.3)
Creatinine, Ser: 0.95 mg/dL (ref 0.57–1.00)
GFR calc non Af Amer: 63 mL/min/{1.73_m2} (ref >59)
GFR, EST AFRICAN AMERICAN: 73 mL/min/{1.73_m2} (ref >59)
GLOBULIN, TOTAL: 2.3 g/dL (ref 1.5–4.5)
Glucose: 95 mg/dL (ref 65–99)
Potassium: 4.1 mmol/L (ref 3.5–5.2)
SODIUM: 139 mmol/L (ref 134–144)
Total Protein: 6.8 g/dL (ref 6.0–8.5)

## 2013-12-03 LAB — NMR, LIPOPROFILE
Cholesterol: 227 mg/dL — ABNORMAL HIGH (ref 100–199)
HDL Cholesterol by NMR: 40 mg/dL (ref >39)
HDL PARTICLE NUMBER: 23.9 umol/L — AB (ref >=30.5)
LDL PARTICLE NUMBER: 1956 nmol/L — AB (ref <1000)
LDL SIZE: 20.6 nm (ref >20.5)
LDLC SERPL CALC-MCNC: 140 mg/dL — ABNORMAL HIGH (ref 0–99)
LP-IR SCORE: 46 — AB (ref <=45)
Small LDL Particle Number: 789 nmol/L — ABNORMAL HIGH (ref <=527)
Triglycerides by NMR: 233 mg/dL — ABNORMAL HIGH (ref 0–149)

## 2013-12-03 LAB — THYROID PANEL WITH TSH
Free Thyroxine Index: 3.4 (ref 1.2–4.9)
T3 Uptake Ratio: 29 % (ref 24–39)
T4, Total: 11.7 ug/dL (ref 4.5–12.0)
TSH: 2.8 u[IU]/mL (ref 0.450–4.500)

## 2013-12-05 ENCOUNTER — Ambulatory Visit (HOSPITAL_BASED_OUTPATIENT_CLINIC_OR_DEPARTMENT_OTHER): Payer: 59

## 2013-12-05 ENCOUNTER — Other Ambulatory Visit (HOSPITAL_BASED_OUTPATIENT_CLINIC_OR_DEPARTMENT_OTHER): Payer: 59

## 2013-12-05 VITALS — BP 119/60 | HR 73 | Temp 97.9°F | Resp 18

## 2013-12-05 DIAGNOSIS — C801 Malignant (primary) neoplasm, unspecified: Secondary | ICD-10-CM

## 2013-12-05 DIAGNOSIS — C787 Secondary malignant neoplasm of liver and intrahepatic bile duct: Secondary | ICD-10-CM

## 2013-12-05 DIAGNOSIS — Z452 Encounter for adjustment and management of vascular access device: Secondary | ICD-10-CM

## 2013-12-05 DIAGNOSIS — C221 Intrahepatic bile duct carcinoma: Secondary | ICD-10-CM

## 2013-12-05 DIAGNOSIS — Z5111 Encounter for antineoplastic chemotherapy: Secondary | ICD-10-CM

## 2013-12-05 LAB — COMPREHENSIVE METABOLIC PANEL (CC13)
ALBUMIN: 4 g/dL (ref 3.5–5.0)
ALT: 39 U/L (ref 0–55)
AST: 33 U/L (ref 5–34)
Alkaline Phosphatase: 70 U/L (ref 40–150)
Anion Gap: 8 mEq/L (ref 3–11)
BUN: 13.6 mg/dL (ref 7.0–26.0)
CO2: 29 mEq/L (ref 22–29)
CREATININE: 0.8 mg/dL (ref 0.6–1.1)
Calcium: 10.1 mg/dL (ref 8.4–10.4)
Chloride: 101 mEq/L (ref 98–109)
GLUCOSE: 94 mg/dL (ref 70–140)
POTASSIUM: 3.4 meq/L — AB (ref 3.5–5.1)
Sodium: 137 mEq/L (ref 136–145)
Total Bilirubin: 0.57 mg/dL (ref 0.20–1.20)
Total Protein: 7.1 g/dL (ref 6.4–8.3)

## 2013-12-05 LAB — CBC WITH DIFFERENTIAL/PLATELET
BASO%: 0.6 % (ref 0.0–2.0)
BASOS ABS: 0 10*3/uL (ref 0.0–0.1)
EOS ABS: 0.1 10*3/uL (ref 0.0–0.5)
EOS%: 1.6 % (ref 0.0–7.0)
HCT: 32.8 % — ABNORMAL LOW (ref 34.8–46.6)
HEMOGLOBIN: 11 g/dL — AB (ref 11.6–15.9)
LYMPH%: 19 % (ref 14.0–49.7)
MCH: 34.6 pg — ABNORMAL HIGH (ref 25.1–34.0)
MCHC: 33.5 g/dL (ref 31.5–36.0)
MCV: 103.4 fL — AB (ref 79.5–101.0)
MONO#: 0.6 10*3/uL (ref 0.1–0.9)
MONO%: 9.6 % (ref 0.0–14.0)
NEUT#: 4.7 10*3/uL (ref 1.5–6.5)
NEUT%: 69.2 % (ref 38.4–76.8)
Platelets: 472 10*3/uL — ABNORMAL HIGH (ref 145–400)
RBC: 3.17 10*6/uL — ABNORMAL LOW (ref 3.70–5.45)
RDW: 21.3 % — ABNORMAL HIGH (ref 11.2–14.5)
WBC: 6.7 10*3/uL (ref 3.9–10.3)
lymph#: 1.3 10*3/uL (ref 0.9–3.3)

## 2013-12-05 MED ORDER — ALTEPLASE 2 MG IJ SOLR
2.0000 mg | Freq: Once | INTRAMUSCULAR | Status: AC | PRN
  Administered 2013-12-05: 2 mg
  Filled 2013-12-05: qty 2

## 2013-12-05 MED ORDER — PROCHLORPERAZINE MALEATE 10 MG PO TABS
ORAL_TABLET | ORAL | Status: AC
  Filled 2013-12-05: qty 1

## 2013-12-05 MED ORDER — PROCHLORPERAZINE MALEATE 10 MG PO TABS
10.0000 mg | ORAL_TABLET | Freq: Once | ORAL | Status: AC
  Administered 2013-12-05: 10 mg via ORAL

## 2013-12-05 MED ORDER — SODIUM CHLORIDE 0.9 % IJ SOLN
10.0000 mL | INTRAMUSCULAR | Status: DC | PRN
  Administered 2013-12-05: 10 mL
  Filled 2013-12-05: qty 10

## 2013-12-05 MED ORDER — SODIUM CHLORIDE 0.9 % IV SOLN
Freq: Once | INTRAVENOUS | Status: AC
  Administered 2013-12-05: 15:00:00 via INTRAVENOUS

## 2013-12-05 MED ORDER — SODIUM CHLORIDE 0.9 % IV SOLN
1000.0000 mg/m2 | Freq: Once | INTRAVENOUS | Status: AC
  Administered 2013-12-05: 1938 mg via INTRAVENOUS
  Filled 2013-12-05: qty 51

## 2013-12-05 MED ORDER — HEPARIN SOD (PORK) LOCK FLUSH 100 UNIT/ML IV SOLN
500.0000 [IU] | Freq: Once | INTRAVENOUS | Status: AC | PRN
  Administered 2013-12-05: 500 [IU]
  Filled 2013-12-05: qty 5

## 2013-12-05 NOTE — Patient Instructions (Signed)
[facility] Discharge Instructions for Patients Receiving Chemotherapy  Today you received the following chemotherapy agents Gemzar.  To help prevent nausea and vomiting after your treatment, we encourage you to take your nausea medication as prescribed.   If you develop nausea and vomiting that is not controlled by your nausea medication, call the clinic.   BELOW ARE SYMPTOMS THAT SHOULD BE REPORTED IMMEDIATELY:  *FEVER GREATER THAN 100.5 F  *CHILLS WITH OR WITHOUT FEVER  NAUSEA AND VOMITING THAT IS NOT CONTROLLED WITH YOUR NAUSEA MEDICATION  *UNUSUAL SHORTNESS OF BREATH  *UNUSUAL BRUISING OR BLEEDING  TENDERNESS IN MOUTH AND THROAT WITH OR WITHOUT PRESENCE OF ULCERS  *URINARY PROBLEMS  *BOWEL PROBLEMS  UNUSUAL RASH Items with * indicate a potential emergency and should be followed up as soon as possible.  Feel free to call the clinic you have any questions or concerns. The clinic phone number is [phone].    

## 2013-12-12 ENCOUNTER — Other Ambulatory Visit (HOSPITAL_BASED_OUTPATIENT_CLINIC_OR_DEPARTMENT_OTHER): Payer: 59

## 2013-12-12 ENCOUNTER — Ambulatory Visit (HOSPITAL_BASED_OUTPATIENT_CLINIC_OR_DEPARTMENT_OTHER): Payer: 59

## 2013-12-12 VITALS — BP 111/58 | HR 72 | Temp 98.3°F | Resp 16

## 2013-12-12 DIAGNOSIS — C801 Malignant (primary) neoplasm, unspecified: Secondary | ICD-10-CM

## 2013-12-12 DIAGNOSIS — C787 Secondary malignant neoplasm of liver and intrahepatic bile duct: Secondary | ICD-10-CM

## 2013-12-12 DIAGNOSIS — Z5111 Encounter for antineoplastic chemotherapy: Secondary | ICD-10-CM

## 2013-12-12 DIAGNOSIS — C221 Intrahepatic bile duct carcinoma: Secondary | ICD-10-CM

## 2013-12-12 LAB — CBC WITH DIFFERENTIAL/PLATELET
BASO%: 1.4 % (ref 0.0–2.0)
Basophils Absolute: 0.1 10*3/uL (ref 0.0–0.1)
EOS%: 1.1 % (ref 0.0–7.0)
Eosinophils Absolute: 0 10*3/uL (ref 0.0–0.5)
HCT: 30.7 % — ABNORMAL LOW (ref 34.8–46.6)
HGB: 10.4 g/dL — ABNORMAL LOW (ref 11.6–15.9)
LYMPH#: 1.4 10*3/uL (ref 0.9–3.3)
LYMPH%: 31.7 % (ref 14.0–49.7)
MCH: 35.1 pg — ABNORMAL HIGH (ref 25.1–34.0)
MCHC: 33.8 g/dL (ref 31.5–36.0)
MCV: 103.9 fL — ABNORMAL HIGH (ref 79.5–101.0)
MONO#: 0.4 10*3/uL (ref 0.1–0.9)
MONO%: 10.1 % (ref 0.0–14.0)
NEUT%: 55.7 % (ref 38.4–76.8)
NEUTROS ABS: 2.4 10*3/uL (ref 1.5–6.5)
PLATELETS: 477 10*3/uL — AB (ref 145–400)
RBC: 2.96 10*6/uL — AB (ref 3.70–5.45)
RDW: 20.1 % — ABNORMAL HIGH (ref 11.2–14.5)
WBC: 4.3 10*3/uL (ref 3.9–10.3)

## 2013-12-12 LAB — COMPREHENSIVE METABOLIC PANEL (CC13)
ALBUMIN: 3.9 g/dL (ref 3.5–5.0)
ALK PHOS: 64 U/L (ref 40–150)
ALT: 35 U/L (ref 0–55)
AST: 32 U/L (ref 5–34)
Anion Gap: 8 mEq/L (ref 3–11)
BILIRUBIN TOTAL: 0.25 mg/dL (ref 0.20–1.20)
BUN: 13.4 mg/dL (ref 7.0–26.0)
CO2: 28 mEq/L (ref 22–29)
Calcium: 10 mg/dL (ref 8.4–10.4)
Chloride: 103 mEq/L (ref 98–109)
Creatinine: 0.9 mg/dL (ref 0.6–1.1)
Glucose: 84 mg/dl (ref 70–140)
POTASSIUM: 3.8 meq/L (ref 3.5–5.1)
Sodium: 139 mEq/L (ref 136–145)
Total Protein: 7 g/dL (ref 6.4–8.3)

## 2013-12-12 MED ORDER — PROCHLORPERAZINE MALEATE 10 MG PO TABS
ORAL_TABLET | ORAL | Status: AC
Start: 2013-12-12 — End: 2013-12-12
  Filled 2013-12-12: qty 1

## 2013-12-12 MED ORDER — HEPARIN SOD (PORK) LOCK FLUSH 100 UNIT/ML IV SOLN
500.0000 [IU] | Freq: Once | INTRAVENOUS | Status: AC | PRN
  Administered 2013-12-12: 500 [IU]
  Filled 2013-12-12: qty 5

## 2013-12-12 MED ORDER — SODIUM CHLORIDE 0.9 % IJ SOLN
10.0000 mL | INTRAMUSCULAR | Status: DC | PRN
  Administered 2013-12-12: 10 mL
  Filled 2013-12-12: qty 10

## 2013-12-12 MED ORDER — PROCHLORPERAZINE MALEATE 10 MG PO TABS
10.0000 mg | ORAL_TABLET | Freq: Once | ORAL | Status: AC
  Administered 2013-12-12: 10 mg via ORAL

## 2013-12-12 MED ORDER — SODIUM CHLORIDE 0.9 % IV SOLN
Freq: Once | INTRAVENOUS | Status: AC
  Administered 2013-12-12: 14:00:00 via INTRAVENOUS

## 2013-12-12 MED ORDER — SODIUM CHLORIDE 0.9 % IV SOLN
1000.0000 mg/m2 | Freq: Once | INTRAVENOUS | Status: AC
  Administered 2013-12-12: 1938 mg via INTRAVENOUS
  Filled 2013-12-12: qty 50.97

## 2013-12-12 NOTE — Patient Instructions (Signed)
[facility] Discharge Instructions for Patients Receiving Chemotherapy  Today you received the following chemotherapy agent: Gemzar   To help prevent nausea and vomiting after your treatment, we encourage you to take your nausea medication as prescribed. You received Compazine at 2:05 pm.    If you develop nausea and vomiting that is not controlled by your nausea medication, call the clinic.   BELOW ARE SYMPTOMS THAT SHOULD BE REPORTED IMMEDIATELY:  *FEVER GREATER THAN 100.5 F  *CHILLS WITH OR WITHOUT FEVER  NAUSEA AND VOMITING THAT IS NOT CONTROLLED WITH YOUR NAUSEA MEDICATION  *UNUSUAL SHORTNESS OF BREATH  *UNUSUAL BRUISING OR BLEEDING  TENDERNESS IN MOUTH AND THROAT WITH OR WITHOUT PRESENCE OF ULCERS  *URINARY PROBLEMS  *BOWEL PROBLEMS  UNUSUAL RASH Items with * indicate a potential emergency and should be followed up as soon as possible.  Feel free to call the clinic you have any questions or concerns. The clinic phone number is [phone].

## 2013-12-12 NOTE — Progress Notes (Signed)
1425: Pt reported pain at R chest port a cath site. Mild erythema noted at incision scar. Dressing removed to assess skin, pt reported no relief of pain. Needle placement checked, noted to be mid-port. Huber needle removed at pt request. She reported relief of pain when de-accessed. Site otherwise remarkable. Ice applied to site, will re-access. Reviewed with Dr. Alen Blew, [place] to proceed with re-access and treat with chemo. No new orders. 1442: Port accessed without difficulty. Pt denies pain.

## 2013-12-16 ENCOUNTER — Other Ambulatory Visit: Payer: 59

## 2013-12-16 DIAGNOSIS — Z1212 Encounter for screening for malignant neoplasm of rectum: Secondary | ICD-10-CM

## 2013-12-16 NOTE — Progress Notes (Signed)
Pt came in for lab  only 

## 2013-12-18 LAB — FECAL OCCULT BLOOD, IMMUNOCHEMICAL: Fecal Occult Bld: NEGATIVE

## 2013-12-19 ENCOUNTER — Telehealth: Payer: Self-pay | Admitting: Oncology

## 2013-12-19 ENCOUNTER — Ambulatory Visit (HOSPITAL_BASED_OUTPATIENT_CLINIC_OR_DEPARTMENT_OTHER): Payer: 59

## 2013-12-19 ENCOUNTER — Encounter: Payer: Self-pay | Admitting: Oncology

## 2013-12-19 ENCOUNTER — Other Ambulatory Visit (HOSPITAL_BASED_OUTPATIENT_CLINIC_OR_DEPARTMENT_OTHER): Payer: 59

## 2013-12-19 ENCOUNTER — Ambulatory Visit (HOSPITAL_BASED_OUTPATIENT_CLINIC_OR_DEPARTMENT_OTHER): Payer: 59 | Admitting: Oncology

## 2013-12-19 VITALS — BP 132/59 | HR 80 | Temp 98.2°F | Resp 18 | Ht 65.0 in | Wt 178.6 lb

## 2013-12-19 DIAGNOSIS — C221 Intrahepatic bile duct carcinoma: Secondary | ICD-10-CM

## 2013-12-19 DIAGNOSIS — Z5111 Encounter for antineoplastic chemotherapy: Secondary | ICD-10-CM

## 2013-12-19 DIAGNOSIS — C787 Secondary malignant neoplasm of liver and intrahepatic bile duct: Secondary | ICD-10-CM

## 2013-12-19 DIAGNOSIS — C801 Malignant (primary) neoplasm, unspecified: Secondary | ICD-10-CM

## 2013-12-19 LAB — COMPREHENSIVE METABOLIC PANEL (CC13)
ALBUMIN: 3.8 g/dL (ref 3.5–5.0)
ALK PHOS: 69 U/L (ref 40–150)
ALT: 44 U/L (ref 0–55)
AST: 42 U/L — AB (ref 5–34)
Anion Gap: 10 mEq/L (ref 3–11)
BUN: 9.8 mg/dL (ref 7.0–26.0)
CO2: 27 mEq/L (ref 22–29)
Calcium: 9.9 mg/dL (ref 8.4–10.4)
Chloride: 100 mEq/L (ref 98–109)
Creatinine: 0.8 mg/dL (ref 0.6–1.1)
Glucose: 99 mg/dl (ref 70–140)
POTASSIUM: 3.8 meq/L (ref 3.5–5.1)
Sodium: 137 mEq/L (ref 136–145)
TOTAL PROTEIN: 7.1 g/dL (ref 6.4–8.3)
Total Bilirubin: 0.27 mg/dL (ref 0.20–1.20)

## 2013-12-19 LAB — CBC WITH DIFFERENTIAL/PLATELET
BASO%: 1.3 % (ref 0.0–2.0)
Basophils Absolute: 0 10*3/uL (ref 0.0–0.1)
EOS%: 0.9 % (ref 0.0–7.0)
Eosinophils Absolute: 0 10*3/uL (ref 0.0–0.5)
HCT: 29.9 % — ABNORMAL LOW (ref 34.8–46.6)
HGB: 10 g/dL — ABNORMAL LOW (ref 11.6–15.9)
LYMPH#: 1.3 10*3/uL (ref 0.9–3.3)
LYMPH%: 47.7 % (ref 14.0–49.7)
MCH: 35.6 pg — AB (ref 25.1–34.0)
MCHC: 33.4 g/dL (ref 31.5–36.0)
MCV: 106.5 fL — AB (ref 79.5–101.0)
MONO#: 0.4 10*3/uL (ref 0.1–0.9)
MONO%: 14.9 % — AB (ref 0.0–14.0)
NEUT#: 1 10*3/uL — ABNORMAL LOW (ref 1.5–6.5)
NEUT%: 35.2 % — ABNORMAL LOW (ref 38.4–76.8)
Platelets: 243 10*3/uL (ref 145–400)
RBC: 2.81 10*6/uL — ABNORMAL LOW (ref 3.70–5.45)
RDW: 20.3 % — ABNORMAL HIGH (ref 11.2–14.5)
WBC: 2.8 10*3/uL — ABNORMAL LOW (ref 3.9–10.3)

## 2013-12-19 LAB — TECHNOLOGIST REVIEW

## 2013-12-19 MED ORDER — PROCHLORPERAZINE MALEATE 10 MG PO TABS
ORAL_TABLET | ORAL | Status: AC
  Filled 2013-12-19: qty 1

## 2013-12-19 MED ORDER — SODIUM CHLORIDE 0.9 % IV SOLN
1000.0000 mg/m2 | Freq: Once | INTRAVENOUS | Status: AC
  Administered 2013-12-19: 1938 mg via INTRAVENOUS
  Filled 2013-12-19: qty 50.97

## 2013-12-19 MED ORDER — HEPARIN SOD (PORK) LOCK FLUSH 100 UNIT/ML IV SOLN
500.0000 [IU] | Freq: Once | INTRAVENOUS | Status: AC | PRN
  Administered 2013-12-19: 500 [IU]
  Filled 2013-12-19: qty 5

## 2013-12-19 MED ORDER — PROCHLORPERAZINE MALEATE 10 MG PO TABS
10.0000 mg | ORAL_TABLET | Freq: Once | ORAL | Status: AC
  Administered 2013-12-19: 10 mg via ORAL

## 2013-12-19 MED ORDER — SODIUM CHLORIDE 0.9 % IV SOLN
Freq: Once | INTRAVENOUS | Status: AC
  Administered 2013-12-19: 14:00:00 via INTRAVENOUS

## 2013-12-19 MED ORDER — SODIUM CHLORIDE 0.9 % IJ SOLN
10.0000 mL | INTRAMUSCULAR | Status: DC | PRN
  Administered 2013-12-19: 10 mL
  Filled 2013-12-19: qty 10

## 2013-12-19 NOTE — Patient Instructions (Signed)
[facility] Discharge Instructions for Patients Receiving Chemotherapy  Today you received the following chemotherapy agents GEMZAR  To help prevent nausea and vomiting after your treatment, we encourage you to take your nausea medication Zofran  If you develop nausea and vomiting that is not controlled by your nausea medication, call the clinic.   BELOW ARE SYMPTOMS THAT SHOULD BE REPORTED IMMEDIATELY:  *FEVER GREATER THAN 100.5 F  *CHILLS WITH OR WITHOUT FEVER  NAUSEA AND VOMITING THAT IS NOT CONTROLLED WITH YOUR NAUSEA MEDICATION  *UNUSUAL SHORTNESS OF BREATH  *UNUSUAL BRUISING OR BLEEDING  TENDERNESS IN MOUTH AND THROAT WITH OR WITHOUT PRESENCE OF ULCERS  *URINARY PROBLEMS  *BOWEL PROBLEMS  UNUSUAL RASH Items with * indicate a potential emergency and should be followed up as soon as possible.  Feel free to call the clinic you have any questions or concerns. The clinic phone number is [phone].

## 2013-12-19 NOTE — Progress Notes (Signed)
Hematology and Oncology Follow Up Visit  Lindsay Aguirre [id number] 1950/12/28 63 y.o. 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 This was confirmed by biopsy on 08/14/2013 and imaging studies showed multifocal hepatic lesions.   Prior Therapy: Status post a liver biopsy on 08/14/2013.  Current therapy: Gemcitabine at 1000 mg per meter squared given weekly 3 weeks on and one week off. She is status post 3 cycles. She is here for cycle 4, day 15.   Interim History: Lindsay Aguirre presents for followup and evaluation by herself. Since her last visit, she reports no new complaints and she  is tolerating chemotherapy relatively well. She did report grade 1 fatigue, and grade 1 nausea that occurs about 2 to 3 days after chemotherapy. Nausea has been manageable with anti-emetics. She continues to report constipation which has improved with MiraLax. She reports regular bowel movements now but she has to take laxatives to do so. She is not reporting any abdominal pain or hematochezia. Is not reporting any vomiting with her mild nausea. She reports no fever, chills, shortness of breath chest pain, night sweats or diarrhea.  Has not reported any hematochezia or melena. Has not reported any decline in her energy level or performance status. She continues to perform his activities of daily living without any decline. She has not reported any syncope or neurological changes. She has not reported any arthralgias or myalgias. Did not report any skin rashes or lesions. Did not report any lymphadenopathy or petechiae. She had no infusion-related complications with chemotherapy. Quality of life remains about the same Rest or view of system is unremarkable.  Medications: I have reviewed the patient's current medications. Unchanged by my review. Current Outpatient  Prescriptions  Medication Sig Dispense Refill  . allopurinol (ZYLOPRIM) 300 MG tablet Take 1 tablet (300 mg total) by mouth daily.  30 tablet  5  . bifidobacterium infantis (ALIGN) capsule Take 1 capsule by mouth daily.      [name] BIOTIN PO Take 1 tablet by mouth daily.      . Cholecalciferol (VITAMIN D) 2000 UNITS tablet Take 2,000 Units by mouth daily.      [name] Sodium (COLACE PO) Take 3 capsules by mouth daily.      [name] HYDROcodone-acetaminophen (NORCO/VICODIN) 5-325 MG per tablet Take 1 tablet by mouth every 6 (six) hours as needed for moderate pain.  30 tablet  0  . lidocaine-prilocaine (EMLA) cream Apply 1 application topically as needed. Apply cream, on days of treatment, to [facility] site 1-2 hours prior to treatment.  30 g  0  . lisinopril-hydrochlorothiazide (PRINZIDE,ZESTORETIC) 20-12.5 MG per tablet Take 1 tablet by mouth every evening.  30 tablet  5  . ondansetron (ZOFRAN) 8 MG tablet Take 1 tablet (8 mg total) by mouth every 8 (eight) hours as needed for nausea or vomiting.  30 tablet  0  . polyethylene glycol powder (MIRALAX) powder Take 255 g (1 Container total) by mouth once.  255 g  1  . senna (SENOKOT) 8.6 MG TABS tablet Take 2 tablets (17.2 mg total) by mouth daily.  60 each  0  . SYNTHROID 88 MCG tablet TAKE 1 TABLET (88 MCG TOTAL) BY MOUTH DAILY BEFORE BREAKFAST.  30 tablet  5  . vitamin C (ASCORBIC ACID) 500 MG tablet Take 500 mg by mouth daily.       No current  facility-administered medications for this visit.     Allergies:  Allergies  Allergen Reactions  . Livalo [Pitavastatin]     Leg cramps  . Simvastatin     Leg cramps  . Statins     Leg Cramps     Past Medical History, Surgical history, Social history, and Family History were reviewed and updated.  Physical Exam: Blood pressure 132/59, pulse 80, temperature 98.2 F (36.8 C), temperature source Oral, resp. rate 18, height 5\' 5"  (1.651 m), weight 178 lb 9.6 oz (81.012 kg), SpO2 98.00%. ECOG: 0 General  appearance: alert and cooperative Head: Normocephalic, without obvious abnormality Neck: no adenopathy Lymph nodes: Cervical, supraclavicular, and axillary nodes normal. Heart:regular rate and rhythm, S1, S2.  Lung:chest clear, no wheezing. Dullness to percussion. Abdomin: soft, non-tender, without masses or organomegaly EXT:no erythema, induration, or edema Skin: No rashes or lesions.  Lab Results: Lab Results  Component Value Date   WBC 2.8* 12/19/2013   HGB 10.0* 12/19/2013   HCT 29.9* 12/19/2013   MCV 106.5* 12/19/2013   PLT 243 12/19/2013     Chemistry      Component Value Date/Time   NA 139 12/12/2013 1256   NA 139 12/02/2013 1226   NA 126* 07/11/2013 0610   K 3.8 12/12/2013 1256   K 4.1 12/02/2013 1226   CL 99 12/02/2013 1226   CO2 28 12/12/2013 1256   CO2 26 12/02/2013 1226   BUN 13.4 12/12/2013 1256   BUN 9 12/02/2013 1226   BUN 11 07/11/2013 0610   CREATININE 0.9 12/12/2013 1256   CREATININE 0.95 12/02/2013 1226   CREATININE 0.81 10/24/2012 1203      Component Value Date/Time   CALCIUM 10.0 12/12/2013 1256   CALCIUM 10.1 12/02/2013 1226   ALKPHOS 64 12/12/2013 1256   ALKPHOS 67 12/02/2013 1226   AST 32 12/12/2013 1256   AST 38 12/02/2013 1226   ALT 35 12/12/2013 1256   ALT 47* 12/02/2013 1226   BILITOT 0.25 12/12/2013 1256   BILITOT 0.4 12/02/2013 1226      Impression and Plan:   63 year old woman with the following issues:  1. 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 She is on chemotherapy with gemcitabine 1000 mg day 1, 8 and 15 of a 28 day cycle. After 2 cycles of chemotherapy, she completed a CT scan on 10/29/2013 which showed stable disease. She is completing cycle 4 today and the plan is to restage her after that. I will set up a CT scan on 12/31/2013 followup for the start of his cycle 5. If she has a good response to chemotherapy, we'll continue the current regimen. If she develops progression of her disease, we will  consider alternative options. His mild neutropenia but she will proceed with chemotherapy today.  2. IV access: Status post Port-A-Cath placement on 09/03/2013. No complications noted per her report.   3. Antiemetics: Continue Zofran as needed   4. Constipation: She uses MiraLax with excellent response.  5. Followup: She will return on 12/31/2013 poor CT scan images and a followup on 01/02/2014 for the start of her next cycle.  Zola Button, MD 8/20/20151:17 PM

## 2013-12-19 NOTE — Telephone Encounter (Signed)
gv adn printed appt scheda nd avs for pt for Sept...gv pt barium....sed added tx.

## 2013-12-31 ENCOUNTER — Ambulatory Visit (HOSPITAL_COMMUNITY)
Admission: RE | Admit: 2013-12-31 | Discharge: 2013-12-31 | Disposition: A | Discharge status: Home / Self Care | Payer: 59 | Source: Ambulatory Visit | Attending: Oncology | Admitting: Oncology

## 2013-12-31 ENCOUNTER — Encounter (HOSPITAL_COMMUNITY): Payer: Self-pay

## 2013-12-31 ENCOUNTER — Other Ambulatory Visit (HOSPITAL_BASED_OUTPATIENT_CLINIC_OR_DEPARTMENT_OTHER): Payer: 59

## 2013-12-31 DIAGNOSIS — R911 Solitary pulmonary nodule: Secondary | ICD-10-CM | POA: Diagnosis not present

## 2013-12-31 DIAGNOSIS — C221 Intrahepatic bile duct carcinoma: Secondary | ICD-10-CM | POA: Insufficient documentation

## 2013-12-31 LAB — COMPREHENSIVE METABOLIC PANEL (CC13)
ALK PHOS: 72 U/L (ref 40–150)
ALT: 38 U/L (ref 0–55)
AST: 36 U/L — AB (ref 5–34)
Albumin: 3.9 g/dL (ref 3.5–5.0)
Anion Gap: 8 mEq/L (ref 3–11)
BUN: 12.2 mg/dL (ref 7.0–26.0)
CO2: 26 mEq/L (ref 22–29)
CREATININE: 0.9 mg/dL (ref 0.6–1.1)
Calcium: 9.7 mg/dL (ref 8.4–10.4)
Chloride: 107 mEq/L (ref 98–109)
Glucose: 98 mg/dl (ref 70–140)
Potassium: 3.6 mEq/L (ref 3.5–5.1)
Sodium: 141 mEq/L (ref 136–145)
Total Bilirubin: 0.38 mg/dL (ref 0.20–1.20)
Total Protein: 7 g/dL (ref 6.4–8.3)

## 2013-12-31 LAB — CBC WITH DIFFERENTIAL/PLATELET
BASO%: 0.3 % (ref 0.0–2.0)
BASOS ABS: 0 10*3/uL (ref 0.0–0.1)
EOS%: 1.1 % (ref 0.0–7.0)
Eosinophils Absolute: 0.1 10*3/uL (ref 0.0–0.5)
HEMATOCRIT: 30.9 % — AB (ref 34.8–46.6)
HEMOGLOBIN: 10.2 g/dL — AB (ref 11.6–15.9)
LYMPH%: 19.1 % (ref 14.0–49.7)
MCH: 35.7 pg — AB (ref 25.1–34.0)
MCHC: 33.1 g/dL (ref 31.5–36.0)
MCV: 107.9 fL — ABNORMAL HIGH (ref 79.5–101.0)
MONO#: 0.7 10*3/uL (ref 0.1–0.9)
MONO%: 11.8 % (ref 0.0–14.0)
NEUT#: 4.3 10*3/uL (ref 1.5–6.5)
NEUT%: 67.7 % (ref 38.4–76.8)
Platelets: 338 10*3/uL (ref 145–400)
RBC: 2.86 10*6/uL — ABNORMAL LOW (ref 3.70–5.45)
RDW: 22.2 % — AB (ref 11.2–14.5)
WBC: 6.3 10*3/uL (ref 3.9–10.3)
lymph#: 1.2 10*3/uL (ref 0.9–3.3)

## 2013-12-31 MED ORDER — IOHEXOL 300 MG/ML  SOLN
100.0000 mL | Freq: Once | INTRAMUSCULAR | Status: AC | PRN
Start: 2013-12-31 — End: 2013-12-31
  Administered 2013-12-31: 100 mL via INTRAVENOUS

## 2014-01-02 ENCOUNTER — Ambulatory Visit (HOSPITAL_BASED_OUTPATIENT_CLINIC_OR_DEPARTMENT_OTHER): Payer: 59 | Admitting: Oncology

## 2014-01-02 ENCOUNTER — Encounter: Payer: Self-pay | Admitting: Oncology

## 2014-01-02 ENCOUNTER — Telehealth: Payer: Self-pay | Admitting: Oncology

## 2014-01-02 ENCOUNTER — Ambulatory Visit (HOSPITAL_BASED_OUTPATIENT_CLINIC_OR_DEPARTMENT_OTHER): Payer: 59

## 2014-01-02 VITALS — BP 137/66 | HR 86 | Temp 98.2°F | Resp 18 | Ht 65.0 in | Wt 176.0 lb

## 2014-01-02 DIAGNOSIS — C787 Secondary malignant neoplasm of liver and intrahepatic bile duct: Secondary | ICD-10-CM

## 2014-01-02 DIAGNOSIS — C801 Malignant (primary) neoplasm, unspecified: Secondary | ICD-10-CM

## 2014-01-02 DIAGNOSIS — Z23 Encounter for immunization: Secondary | ICD-10-CM

## 2014-01-02 DIAGNOSIS — K5900 Constipation, unspecified: Secondary | ICD-10-CM

## 2014-01-02 DIAGNOSIS — Z5111 Encounter for antineoplastic chemotherapy: Secondary | ICD-10-CM

## 2014-01-02 DIAGNOSIS — C221 Intrahepatic bile duct carcinoma: Secondary | ICD-10-CM

## 2014-01-02 MED ORDER — HEPARIN SOD (PORK) LOCK FLUSH 100 UNIT/ML IV SOLN
500.0000 [IU] | Freq: Once | INTRAVENOUS | Status: AC | PRN
  Administered 2014-01-02: 500 [IU]
  Filled 2014-01-02: qty 5

## 2014-01-02 MED ORDER — INFLUENZA VAC SPLIT QUAD 0.5 ML IM SUSY
0.5000 mL | PREFILLED_SYRINGE | Freq: Once | INTRAMUSCULAR | Status: AC
  Administered 2014-01-02: 0.5 mL via INTRAMUSCULAR
  Filled 2014-01-02: qty 0.5

## 2014-01-02 MED ORDER — SODIUM CHLORIDE 0.9 % IV SOLN
1000.0000 mg/m2 | Freq: Once | INTRAVENOUS | Status: AC
  Administered 2014-01-02: 1938 mg via INTRAVENOUS
  Filled 2014-01-02: qty 50.97

## 2014-01-02 MED ORDER — PROCHLORPERAZINE MALEATE 10 MG PO TABS
ORAL_TABLET | ORAL | Status: AC
  Filled 2014-01-02: qty 1

## 2014-01-02 MED ORDER — SODIUM CHLORIDE 0.9 % IJ SOLN
10.0000 mL | INTRAMUSCULAR | Status: DC | PRN
  Administered 2014-01-02: 10 mL
  Filled 2014-01-02: qty 10

## 2014-01-02 MED ORDER — PROCHLORPERAZINE MALEATE 10 MG PO TABS
10.0000 mg | ORAL_TABLET | Freq: Once | ORAL | Status: AC
  Administered 2014-01-02: 10 mg via ORAL

## 2014-01-02 MED ORDER — SODIUM CHLORIDE 0.9 % IV SOLN
Freq: Once | INTRAVENOUS | Status: AC
  Administered 2014-01-02: 14:00:00 via INTRAVENOUS

## 2014-01-02 NOTE — Progress Notes (Signed)
Hematology and Oncology Follow Up Visit  Lindsay Aguirre [id number] 01-06-1951 63 y.o. 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 This was confirmed by biopsy on 08/14/2013 and imaging studies showed multifocal hepatic lesions.   Prior Therapy: Status post a liver biopsy on 08/14/2013.  Current therapy: Gemcitabine at 1000 mg per meter squared given weekly 3 weeks on and one week off. She is status post 3 cycles. She is here for cycle 4, day 1.  Interim History: Lindsay Aguirre presents for followup. Since her last visit, she reports no new complaints and she is tolerating chemotherapy relatively well. She reports grade 1 nausea that occurs about 2 to 3 days after chemotherapy. Nausea has been manageable with anti-emetics. She continues to report constipation which has improved with MiraLax. She continues to you excellent quality of life without any new complications. She is not reporting any abdominal pain or hematochezia. Is not reporting any vomiting with her mild nausea. She reports no fever, chills, shortness of breath chest pain, night sweats or diarrhea.  Has not reported any hematochezia or melena. Has not reported any decline in her energy level or performance status. She continues to perform his activities of daily living without any decline. She has not reported any syncope or neurological changes. She has not reported any arthralgias or myalgias. Did not report any skin rashes or lesions. Did not report any lymphadenopathy or petechiae. She had no infusion-related complications with chemotherapy.  Rest or view of system is unremarkable.  Medications: I have reviewed the patient's current medications. Unchanged by my review. Current Outpatient Prescriptions  Medication Sig Dispense Refill  . allopurinol (ZYLOPRIM) 300 MG tablet Take 1 tablet  (300 mg total) by mouth daily.  30 tablet  5  . bifidobacterium infantis (ALIGN) capsule Take 1 capsule by mouth daily.      [name] BIOTIN PO Take 1 tablet by mouth daily.      . Cholecalciferol (VITAMIN D) 2000 UNITS tablet Take 2,000 Units by mouth daily.      Lindsay Aguirre Sodium (COLACE PO) Take 3 capsules by mouth daily.      [name] HYDROcodone-acetaminophen (NORCO/VICODIN) 5-325 MG per tablet Take 1 tablet by mouth every 6 (six) hours as needed for moderate pain.  30 tablet  0  . lidocaine-prilocaine (EMLA) cream Apply 1 application topically as needed. Apply cream, on days of treatment, to [facility] site 1-2 hours prior to treatment.  30 g  0  . lisinopril-hydrochlorothiazide (PRINZIDE,ZESTORETIC) 20-12.5 MG per tablet Take 1 tablet by mouth every evening.  30 tablet  5  . ondansetron (ZOFRAN) 8 MG tablet Take 1 tablet (8 mg total) by mouth every 8 (eight) hours as needed for nausea or vomiting.  30 tablet  0  . polyethylene glycol powder (MIRALAX) powder Take 255 g (1 Container total) by mouth once.  255 g  1  . senna (SENOKOT) 8.6 MG TABS tablet Take 2 tablets (17.2 mg total) by mouth daily.  60 each  0  . SYNTHROID 88 MCG tablet TAKE 1 TABLET (88 MCG TOTAL) BY MOUTH DAILY BEFORE BREAKFAST.  30 tablet  5  . vitamin C (ASCORBIC ACID) 500 MG tablet Take 500 mg by mouth daily.       No current facility-administered medications for this visit.     Allergies:  Allergies  Allergen Reactions  . Livalo [Pitavastatin]  Leg cramps  . Simvastatin     Leg cramps  . Statins     Leg Cramps     Past Medical History, Surgical history, Social history, and Family History were reviewed and updated.  Physical Exam: Blood pressure 137/66, pulse 86, temperature 98.2 F (36.8 C), temperature source Oral, resp. rate 18, height 5\' 5"  (1.651 m), weight 176 lb (79.833 kg), SpO2 100.00%. ECOG: 0 General appearance: alert and cooperative Head: Normocephalic, without obvious abnormality Neck: no adenopathy Lymph  nodes: Cervical, supraclavicular, and axillary nodes normal. Heart:regular rate and rhythm, S1, S2.  Lung:chest clear, no wheezing. Dullness to percussion. Abdomin: soft, non-tender, without masses or organomegaly EXT:no erythema, induration, or edema Skin: No rashes or lesions.  Lab Results: Lab Results  Component Value Date   WBC 6.3 12/31/2013   HGB 10.2* 12/31/2013   HCT 30.9* 12/31/2013   MCV 107.9* 12/31/2013   PLT 338 12/31/2013     Chemistry      Component Value Date/Time   NA 141 12/31/2013 1342   NA 139 12/02/2013 1226   NA 126* 07/11/2013 0610   K 3.6 12/31/2013 1342   K 4.1 12/02/2013 1226   CL 99 12/02/2013 1226   CO2 26 12/31/2013 1342   CO2 26 12/02/2013 1226   BUN 12.2 12/31/2013 1342   BUN 9 12/02/2013 1226   BUN 11 07/11/2013 0610   CREATININE 0.9 12/31/2013 1342   CREATININE 0.95 12/02/2013 1226   CREATININE 0.81 10/24/2012 1203      Component Value Date/Time   CALCIUM 9.7 12/31/2013 1342   CALCIUM 10.1 12/02/2013 1226   ALKPHOS 72 12/31/2013 1342   ALKPHOS 67 12/02/2013 1226   AST 36* 12/31/2013 1342   AST 38 12/02/2013 1226   ALT 38 12/31/2013 1342   ALT 47* 12/02/2013 1226   BILITOT 0.38 12/31/2013 1342   BILITOT 0.4 12/02/2013 1226      EXAM:  CT ABDOMEN AND PELVIS WITH CONTRAST  TECHNIQUE:  Multidetector CT imaging of the abdomen and pelvis was performed  using the standard protocol following bolus administration of  intravenous contrast.  CONTRAST: 113mL OMNIPAQUE IOHEXOL 300 MG/ML SOLN  COMPARISON: 12/29/2013  FINDINGS:  No pleural or pericardial effusion identified. 5 mm nodule in the  left lower lobe is unchanged, image 1/series 5.  Index subcapsular lesion within the left hepatic lobe measures 2.1  cm, image 12/series 2. Previously this measured 2.3 cm. Index lesion  within the lateral segment of left hepatic lobe measures 1.8 cm,  image 22/ series 2. Previously this measured 1.9 cm. Lesion within  the medial segment of the left hepatic lobe measures 2.3 cm, image  22/series 2.  Previously 2.5 cm. Dominant mass adjacent to falciform  ligament and gallbladder fossa measures 8.3 by 5.2 cm, image 30/  series 2. Previously 8.6 x 5.7 cm. The gallbladder appears normal.  No biliary dilatation. Normal appearance of the pancreas. Normal  appearance of the spleen. 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  Portal vein remains patent.  Normal appearance of both adrenal glands. The right kidney is  normal. The left kidney is also within normal limits. Urinary  bladder appears normal. The uterus and right adnexa appears normal.  Asymmetric enlargement of the left ovary appears similar to previous  exam.  Calcified atherosclerotic disease involves the abdominal aorta.  There is no aneurysm. No retroperitoneal adenopathy. There is no  pelvic or inguinal adenopathy. No free fluid or fluid collections  within the abdomen or pelvis.  No  free fluid or fluid collections within the abdomen or pelvis. The  stomach appears normal. The small bowel loops are normal in course  and caliber. The appendix is visualized and appears normal. Normal  appearance of the colon.  No evidence for peritoneal nodule or mass identified. Review of the  visualized osseous structures is significant for multi level lumbar  spondylosis. The patient is status post hardware fixation of L3  through L5. There is advanced degenerative disc disease at L5-S1.  IMPRESSION:  1. Mild decrease in size of index lesions within the liver.  2. No evidence for new or progressive disease within the abdomen or  pelvis.  3. Stable 5 mm left lower lobe lung nodule.  4. Persistent asymmetric enlargement of the left ovary. Etiology  uncertain. This could be better assessed with pelvic sonogram.     Impression and Plan:   63 year old woman with the following issues:  1. 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 She is on chemotherapy with gemcitabine 1000 mg day 1  and 15 of a 28 day cycle. She continues to tolerate chemotherapy without any complications. CT scan from 12/31/2013 continue to show excellent response. The plan is to continue with the same dose and regimen and repeat scan in 2-3 months.  2. IV access: Status post Port-A-Cath placement on 09/03/2013. No complications since the last visit the   3. Antiemetics: Continue Zofran as needed   4. Constipation: She uses MiraLax with excellent response.  5. Followup: She will return on 9/17, 10/1 for chemotherapy and on 10:15 for chemotherapy and evaluation.  [facility], MD 9/3/201511:59 AM

## 2014-01-02 NOTE — Patient Instructions (Signed)
[facility] Discharge Instructions for Patients Receiving Chemotherapy  Today you received the following chemotherapy agents:  gemzar To help prevent nausea and vomiting after your treatment, we encourage you to take your nausea medication    If you develop nausea and vomiting that is not controlled by your nausea medication, call the clinic.   BELOW ARE SYMPTOMS THAT SHOULD BE REPORTED IMMEDIATELY:  *FEVER GREATER THAN 100.5 F  *CHILLS WITH OR WITHOUT FEVER  NAUSEA AND VOMITING THAT IS NOT CONTROLLED WITH YOUR NAUSEA MEDICATION  *UNUSUAL SHORTNESS OF BREATH  *UNUSUAL BRUISING OR BLEEDING  TENDERNESS IN MOUTH AND THROAT WITH OR WITHOUT PRESENCE OF ULCERS  *URINARY PROBLEMS  *BOWEL PROBLEMS  UNUSUAL RASH Items with * indicate a potential emergency and should be followed up as soon as possible.  Feel free to call the clinic you have any questions or concerns. The clinic phone number is [phone].

## 2014-01-02 NOTE — Telephone Encounter (Signed)
gvg and printed appt sched and avs for pt for Sept and OCT....sed added tx.

## 2014-01-09 ENCOUNTER — Other Ambulatory Visit: Payer: 59

## 2014-01-16 ENCOUNTER — Other Ambulatory Visit (HOSPITAL_BASED_OUTPATIENT_CLINIC_OR_DEPARTMENT_OTHER): Payer: 59

## 2014-01-16 ENCOUNTER — Ambulatory Visit (HOSPITAL_BASED_OUTPATIENT_CLINIC_OR_DEPARTMENT_OTHER): Payer: 59

## 2014-01-16 VITALS — BP 121/67 | HR 74 | Temp 98.2°F | Resp 18

## 2014-01-16 DIAGNOSIS — C221 Intrahepatic bile duct carcinoma: Secondary | ICD-10-CM

## 2014-01-16 DIAGNOSIS — C801 Malignant (primary) neoplasm, unspecified: Secondary | ICD-10-CM

## 2014-01-16 DIAGNOSIS — Z5111 Encounter for antineoplastic chemotherapy: Secondary | ICD-10-CM

## 2014-01-16 DIAGNOSIS — C787 Secondary malignant neoplasm of liver and intrahepatic bile duct: Secondary | ICD-10-CM

## 2014-01-16 LAB — CBC WITH DIFFERENTIAL/PLATELET
BASO%: 0.6 % (ref 0.0–2.0)
Basophils Absolute: 0 10*3/uL (ref 0.0–0.1)
EOS ABS: 0.1 10*3/uL (ref 0.0–0.5)
EOS%: 2.1 % (ref 0.0–7.0)
HCT: 35.4 % (ref 34.8–46.6)
HGB: 11.9 g/dL (ref 11.6–15.9)
LYMPH%: 18.2 % (ref 14.0–49.7)
MCH: 36 pg — ABNORMAL HIGH (ref 25.1–34.0)
MCHC: 33.6 g/dL (ref 31.5–36.0)
MCV: 106.9 fL — ABNORMAL HIGH (ref 79.5–101.0)
MONO#: 0.7 10*3/uL (ref 0.1–0.9)
MONO%: 10.9 % (ref 0.0–14.0)
NEUT%: 68.2 % (ref 38.4–76.8)
NEUTROS ABS: 4.6 10*3/uL (ref 1.5–6.5)
Platelets: 283 10*3/uL (ref 145–400)
RBC: 3.31 10*6/uL — AB (ref 3.70–5.45)
RDW: 18.6 % — ABNORMAL HIGH (ref 11.2–14.5)
WBC: 6.7 10*3/uL (ref 3.9–10.3)
lymph#: 1.2 10*3/uL (ref 0.9–3.3)

## 2014-01-16 LAB — COMPREHENSIVE METABOLIC PANEL
ALBUMIN: 4.1 g/dL (ref 3.5–5.2)
ALT: 27 U/L (ref 0–35)
AST: 27 U/L (ref 0–37)
Alkaline Phosphatase: 73 U/L (ref 39–117)
BUN: 10 mg/dL (ref 6–23)
CO2: 26 mEq/L (ref 19–32)
Calcium: 10.5 mg/dL (ref 8.4–10.5)
Chloride: 101 mEq/L (ref 96–112)
Creatinine, Ser: 0.75 mg/dL (ref 0.50–1.10)
Glucose, Bld: 97 mg/dL (ref 70–99)
Potassium: 3.9 mEq/L (ref 3.5–5.3)
SODIUM: 140 meq/L (ref 135–145)
TOTAL PROTEIN: 7.7 g/dL (ref 6.0–8.3)
Total Bilirubin: 0.5 mg/dL (ref 0.2–1.2)

## 2014-01-16 MED ORDER — GEMCITABINE HCL CHEMO INJECTION 1 GM/26.3ML
1000.0000 mg/m2 | Freq: Once | INTRAVENOUS | Status: AC
  Administered 2014-01-16: 1938 mg via INTRAVENOUS
  Filled 2014-01-16: qty 50.97

## 2014-01-16 MED ORDER — SODIUM CHLORIDE 0.9 % IV SOLN
Freq: Once | INTRAVENOUS | Status: AC
  Administered 2014-01-16: 14:00:00 via INTRAVENOUS

## 2014-01-16 MED ORDER — PROCHLORPERAZINE MALEATE 10 MG PO TABS
ORAL_TABLET | ORAL | Status: AC
  Filled 2014-01-16: qty 1

## 2014-01-16 MED ORDER — HEPARIN SOD (PORK) LOCK FLUSH 100 UNIT/ML IV SOLN
500.0000 [IU] | Freq: Once | INTRAVENOUS | Status: AC | PRN
  Administered 2014-01-16: 500 [IU]
  Filled 2014-01-16: qty 5

## 2014-01-16 MED ORDER — PROCHLORPERAZINE MALEATE 10 MG PO TABS
10.0000 mg | ORAL_TABLET | Freq: Once | ORAL | Status: AC
  Administered 2014-01-16: 10 mg via ORAL

## 2014-01-16 MED ORDER — SODIUM CHLORIDE 0.9 % IJ SOLN
10.0000 mL | INTRAMUSCULAR | Status: DC | PRN
  Administered 2014-01-16: 10 mL
  Filled 2014-01-16: qty 10

## 2014-01-29 ENCOUNTER — Other Ambulatory Visit: Payer: Self-pay | Admitting: Nurse Practitioner

## 2014-01-30 ENCOUNTER — Other Ambulatory Visit (HOSPITAL_BASED_OUTPATIENT_CLINIC_OR_DEPARTMENT_OTHER): Payer: 59

## 2014-01-30 ENCOUNTER — Ambulatory Visit (HOSPITAL_BASED_OUTPATIENT_CLINIC_OR_DEPARTMENT_OTHER): Payer: 59

## 2014-01-30 VITALS — BP 105/56 | HR 75 | Temp 97.7°F | Resp 18

## 2014-01-30 DIAGNOSIS — C801 Malignant (primary) neoplasm, unspecified: Secondary | ICD-10-CM

## 2014-01-30 DIAGNOSIS — Z5111 Encounter for antineoplastic chemotherapy: Secondary | ICD-10-CM

## 2014-01-30 DIAGNOSIS — C787 Secondary malignant neoplasm of liver and intrahepatic bile duct: Secondary | ICD-10-CM

## 2014-01-30 DIAGNOSIS — C221 Intrahepatic bile duct carcinoma: Secondary | ICD-10-CM

## 2014-01-30 LAB — CBC WITH DIFFERENTIAL/PLATELET
BASO%: 0.8 % (ref 0.0–2.0)
Basophils Absolute: 0.1 10*3/uL (ref 0.0–0.1)
EOS%: 2.5 % (ref 0.0–7.0)
Eosinophils Absolute: 0.2 10*3/uL (ref 0.0–0.5)
HEMATOCRIT: 35.8 % (ref 34.8–46.6)
HGB: 11.8 g/dL (ref 11.6–15.9)
LYMPH#: 1.4 10*3/uL (ref 0.9–3.3)
LYMPH%: 18.8 % (ref 14.0–49.7)
MCH: 35.6 pg — ABNORMAL HIGH (ref 25.1–34.0)
MCHC: 33 g/dL (ref 31.5–36.0)
MCV: 108 fL — ABNORMAL HIGH (ref 79.5–101.0)
MONO#: 0.6 10*3/uL (ref 0.1–0.9)
MONO%: 8 % (ref 0.0–14.0)
NEUT#: 5.2 10*3/uL (ref 1.5–6.5)
NEUT%: 69.9 % (ref 38.4–76.8)
Platelets: 241 10*3/uL (ref 145–400)
RBC: 3.32 10*6/uL — ABNORMAL LOW (ref 3.70–5.45)
RDW: 20.2 % — AB (ref 11.2–14.5)
WBC: 7.4 10*3/uL (ref 3.9–10.3)

## 2014-01-30 LAB — COMPREHENSIVE METABOLIC PANEL (CC13)
ALT: 26 U/L (ref 0–55)
AST: 26 U/L (ref 5–34)
Albumin: 3.9 g/dL (ref 3.5–5.0)
Alkaline Phosphatase: 76 U/L (ref 40–150)
Anion Gap: 8 mEq/L (ref 3–11)
BUN: 14.1 mg/dL (ref 7.0–26.0)
CALCIUM: 9.9 mg/dL (ref 8.4–10.4)
CO2: 27 meq/L (ref 22–29)
CREATININE: 0.9 mg/dL (ref 0.6–1.1)
Chloride: 103 mEq/L (ref 98–109)
Glucose: 126 mg/dl (ref 70–140)
Potassium: 3.7 mEq/L (ref 3.5–5.1)
Sodium: 138 mEq/L (ref 136–145)
Total Bilirubin: 0.44 mg/dL (ref 0.20–1.20)
Total Protein: 7.3 g/dL (ref 6.4–8.3)

## 2014-01-30 MED ORDER — SODIUM CHLORIDE 0.9 % IJ SOLN
10.0000 mL | INTRAMUSCULAR | Status: DC | PRN
  Administered 2014-01-30: 10 mL
  Filled 2014-01-30: qty 10

## 2014-01-30 MED ORDER — PROCHLORPERAZINE MALEATE 10 MG PO TABS
ORAL_TABLET | ORAL | Status: AC
  Filled 2014-01-30: qty 1

## 2014-01-30 MED ORDER — SODIUM CHLORIDE 0.9 % IV SOLN
Freq: Once | INTRAVENOUS | Status: AC
  Administered 2014-01-30: 14:00:00 via INTRAVENOUS

## 2014-01-30 MED ORDER — PROCHLORPERAZINE MALEATE 10 MG PO TABS
10.0000 mg | ORAL_TABLET | Freq: Once | ORAL | Status: AC
  Administered 2014-01-30: 10 mg via ORAL

## 2014-01-30 MED ORDER — SODIUM CHLORIDE 0.9 % IV SOLN
1000.0000 mg/m2 | Freq: Once | INTRAVENOUS | Status: AC
  Administered 2014-01-30: 1938 mg via INTRAVENOUS
  Filled 2014-01-30: qty 50.97

## 2014-01-30 MED ORDER — HEPARIN SOD (PORK) LOCK FLUSH 100 UNIT/ML IV SOLN
500.0000 [IU] | Freq: Once | INTRAVENOUS | Status: AC | PRN
  Administered 2014-01-30: 500 [IU]
  Filled 2014-01-30: qty 5

## 2014-01-30 NOTE — Patient Instructions (Signed)
[facility] Discharge Instructions for Patients Receiving Chemotherapy  Today you received the following chemotherapy agents Gemzar.  To help prevent nausea and vomiting after your treatment, we encourage you to take your nausea medication.   If you develop nausea and vomiting that is not controlled by your nausea medication, call the clinic.   BELOW ARE SYMPTOMS THAT SHOULD BE REPORTED IMMEDIATELY:  *FEVER GREATER THAN 100.5 F  *CHILLS WITH OR WITHOUT FEVER  NAUSEA AND VOMITING THAT IS NOT CONTROLLED WITH YOUR NAUSEA MEDICATION  *UNUSUAL SHORTNESS OF BREATH  *UNUSUAL BRUISING OR BLEEDING  TENDERNESS IN MOUTH AND THROAT WITH OR WITHOUT PRESENCE OF ULCERS  *URINARY PROBLEMS  *BOWEL PROBLEMS  UNUSUAL RASH Items with * indicate a potential emergency and should be followed up as soon as possible.  Feel free to call the clinic you have any questions or concerns. The clinic phone number is [phone].    

## 2014-01-31 ENCOUNTER — Telehealth: Payer: Self-pay | Admitting: Medical Oncology

## 2014-01-31 NOTE — Telephone Encounter (Signed)
Patient calling to see if it is ok for her to go to dentist to have a loose crown assessed. Review with MD, informed patient that Dr. Alen Blew states that is fine. Patient expressed understanding, denies further questions at this time and knows to contact office/clinic with any other concerns.

## 2014-02-13 ENCOUNTER — Ambulatory Visit (HOSPITAL_BASED_OUTPATIENT_CLINIC_OR_DEPARTMENT_OTHER): Payer: 59

## 2014-02-13 ENCOUNTER — Other Ambulatory Visit (HOSPITAL_BASED_OUTPATIENT_CLINIC_OR_DEPARTMENT_OTHER): Payer: 59

## 2014-02-13 ENCOUNTER — Ambulatory Visit (HOSPITAL_BASED_OUTPATIENT_CLINIC_OR_DEPARTMENT_OTHER): Payer: 59 | Admitting: Physician Assistant

## 2014-02-13 ENCOUNTER — Encounter: Payer: Self-pay | Admitting: Physician Assistant

## 2014-02-13 VITALS — BP 125/60 | HR 81 | Temp 98.3°F | Resp 18 | Ht 65.0 in | Wt 176.9 lb

## 2014-02-13 DIAGNOSIS — C787 Secondary malignant neoplasm of liver and intrahepatic bile duct: Secondary | ICD-10-CM

## 2014-02-13 DIAGNOSIS — K5900 Constipation, unspecified: Secondary | ICD-10-CM

## 2014-02-13 DIAGNOSIS — C801 Malignant (primary) neoplasm, unspecified: Secondary | ICD-10-CM

## 2014-02-13 DIAGNOSIS — C221 Intrahepatic bile duct carcinoma: Secondary | ICD-10-CM

## 2014-02-13 DIAGNOSIS — Z5111 Encounter for antineoplastic chemotherapy: Secondary | ICD-10-CM

## 2014-02-13 LAB — CBC WITH DIFFERENTIAL/PLATELET
BASO%: 0.8 % (ref 0.0–2.0)
BASOS ABS: 0.1 10*3/uL (ref 0.0–0.1)
EOS%: 2.4 % (ref 0.0–7.0)
Eosinophils Absolute: 0.2 10*3/uL (ref 0.0–0.5)
HCT: 35 % (ref 34.8–46.6)
HGB: 11.6 g/dL (ref 11.6–15.9)
LYMPH%: 18.9 % (ref 14.0–49.7)
MCH: 35.2 pg — AB (ref 25.1–34.0)
MCHC: 33.1 g/dL (ref 31.5–36.0)
MCV: 106.1 fL — ABNORMAL HIGH (ref 79.5–101.0)
MONO#: 0.7 10*3/uL (ref 0.1–0.9)
MONO%: 9.3 % (ref 0.0–14.0)
NEUT#: 4.8 10*3/uL (ref 1.5–6.5)
NEUT%: 68.6 % (ref 38.4–76.8)
Platelets: 212 10*3/uL (ref 145–400)
RBC: 3.3 10*6/uL — ABNORMAL LOW (ref 3.70–5.45)
RDW: 19 % — AB (ref 11.2–14.5)
WBC: 7 10*3/uL (ref 3.9–10.3)
lymph#: 1.3 10*3/uL (ref 0.9–3.3)

## 2014-02-13 LAB — COMPREHENSIVE METABOLIC PANEL (CC13)
ALK PHOS: 81 U/L (ref 40–150)
ALT: 26 U/L (ref 0–55)
AST: 28 U/L (ref 5–34)
Albumin: 3.8 g/dL (ref 3.5–5.0)
Anion Gap: 9 mEq/L (ref 3–11)
BILIRUBIN TOTAL: 0.36 mg/dL (ref 0.20–1.20)
BUN: 11 mg/dL (ref 7.0–26.0)
CO2: 27 mEq/L (ref 22–29)
Calcium: 9.9 mg/dL (ref 8.4–10.4)
Chloride: 103 mEq/L (ref 98–109)
Creatinine: 1 mg/dL (ref 0.6–1.1)
Glucose: 92 mg/dl (ref 70–140)
POTASSIUM: 3.5 meq/L (ref 3.5–5.1)
Sodium: 139 mEq/L (ref 136–145)
TOTAL PROTEIN: 7 g/dL (ref 6.4–8.3)

## 2014-02-13 MED ORDER — SODIUM CHLORIDE 0.9 % IV SOLN
Freq: Once | INTRAVENOUS | Status: AC
  Administered 2014-02-13: 15:00:00 via INTRAVENOUS

## 2014-02-13 MED ORDER — PROCHLORPERAZINE MALEATE 10 MG PO TABS
ORAL_TABLET | ORAL | Status: AC
  Filled 2014-02-13: qty 1

## 2014-02-13 MED ORDER — SODIUM CHLORIDE 0.9 % IJ SOLN
10.0000 mL | INTRAMUSCULAR | Status: DC | PRN
  Administered 2014-02-13: 10 mL
  Filled 2014-02-13: qty 10

## 2014-02-13 MED ORDER — SODIUM CHLORIDE 0.9 % IV SOLN
1000.0000 mg/m2 | Freq: Once | INTRAVENOUS | Status: AC
  Administered 2014-02-13: 1938 mg via INTRAVENOUS
  Filled 2014-02-13: qty 50.97

## 2014-02-13 MED ORDER — HEPARIN SOD (PORK) LOCK FLUSH 100 UNIT/ML IV SOLN
500.0000 [IU] | Freq: Once | INTRAVENOUS | Status: AC | PRN
  Administered 2014-02-13: 500 [IU]
  Filled 2014-02-13: qty 5

## 2014-02-13 MED ORDER — PROCHLORPERAZINE MALEATE 10 MG PO TABS
10.0000 mg | ORAL_TABLET | Freq: Once | ORAL | Status: AC
  Administered 2014-02-13: 10 mg via ORAL

## 2014-02-13 NOTE — Progress Notes (Signed)
Hematology and Oncology Follow Up Visit  Lindsay Aguirre [id number] [birth year] 63 y.o. 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 This was confirmed by biopsy on 08/14/2013 and imaging studies showed multifocal hepatic lesions.   Prior Therapy: Status post a liver biopsy on 08/14/2013.  Current therapy: Gemcitabine at 1000 mg per meter squared given weekly 3 weeks on and one week off. She is status post 3 cycles. She is here for cycle 5, day 1.  Interim History: Mrs. Lindsay Aguirre presents for followup. Since her last visit, she reports no new complaints and she is tolerating chemotherapy relatively well. She tells me she is a [place] with her daughters, son-in-law and grandchildren next week. She is looking forward to this trip. She reports grade 1 nausea that occurs about 2 to 3 days after chemotherapy. Nausea has been manageable with anti-emetics.  She continues to have an excellent quality of life without any new complications. She is not reporting any abdominal pain or hematochezia. Is not reporting any vomiting with her mild nausea. She reports no fever, chills, shortness of breath chest pain, night sweats or diarrhea.  Has not reported any hematochezia or melena. Has not reported any decline in her energy level or performance status. She continues to perform his activities of daily living without any decline. She has not reported any syncope or neurological changes. She has not reported any arthralgias or myalgias. Did not report any skin rashes or lesions. Did not report any lymphadenopathy or petechiae. She had no infusion-related complications with chemotherapy.  Remainder of her review of system is unremarkable.  Medications: I have reviewed the patient's current medications. Unchanged by my review. Current Outpatient  Prescriptions  Medication Sig Dispense Refill  . allopurinol (ZYLOPRIM) 300 MG tablet Take 1 tablet (300 mg total) by mouth daily.  30 tablet  5  . bifidobacterium infantis (ALIGN) capsule Take 1 capsule by mouth daily.      [name] BIOTIN PO Take 1 tablet by mouth daily.      . Cholecalciferol (VITAMIN D) 2000 UNITS tablet Take 2,000 Units by mouth daily.      [name] HYDROcodone-acetaminophen (NORCO/VICODIN) 5-325 MG per tablet Take 1 tablet by mouth every 6 (six) hours as needed for moderate pain.  30 tablet  0  . lidocaine-prilocaine (EMLA) cream Apply 1 application topically as needed. Apply cream, on days of treatment, to [facility] site 1-2 hours prior to treatment.  30 g  0  . lisinopril-hydrochlorothiazide (PRINZIDE,ZESTORETIC) 20-12.5 MG per tablet Take 1 tablet by mouth every evening.  30 tablet  5  . ondansetron (ZOFRAN) 8 MG tablet Take 1 tablet (8 mg total) by mouth every 8 (eight) hours as needed for nausea or vomiting.  30 tablet  0  . polyethylene glycol powder (GLYCOLAX/MIRALAX) powder TAKE 1 CONTAINER TOTAL ONCE AS DIRECTED  255 g  1  . senna (SENOKOT) 8.6 MG TABS tablet Take 2 tablets (17.2 mg total) by mouth daily.  60 each  0  . SYNTHROID 88 MCG tablet TAKE 1 TABLET (88 MCG TOTAL) BY MOUTH DAILY BEFORE BREAKFAST.  30 tablet  5  . vitamin C (ASCORBIC ACID) 500 MG tablet Take 500 mg by mouth daily.      Tery Sanfilippo Sodium (COLACE PO) Take 3 capsules by mouth daily.       No current facility-administered medications  for this visit.   Facility-Administered Medications Ordered in Other Visits  Medication Dose Route Frequency Provider Last Rate Last Dose  . sodium chloride 0.9 % injection 10 mL  10 mL Intracatheter PRN Benjiman Core, MD   10 mL at 02/13/14 1546     Allergies:  Allergies  Allergen Reactions  . Livalo [Pitavastatin]     Leg cramps  . Simvastatin     Leg cramps  . Statins     Leg Cramps     Past Medical History, Surgical history, Social history, and Family History were  reviewed and updated.  Physical Exam: Blood pressure 125/60, pulse 81, temperature 98.3 F (36.8 C), temperature source Oral, resp. rate 18, height 5\' 5"  (1.651 m), weight 176 lb 14.4 oz (80.241 kg), SpO2 100.00%. ECOG: 0 General appearance: alert and cooperative Head: Normocephalic, without obvious abnormality Neck: no adenopathy Lymph nodes: Cervical, supraclavicular, and axillary nodes normal. Heart:regular rate and rhythm, S1, S2.  Lung:chest clear, no wheezing. Dullness to percussion. Abdomin: soft, non-tender, without masses or organomegaly EXT:no erythema, induration, or edema Skin: No rashes or lesions.  Lab Results: Lab Results  Component Value Date   WBC 7.0 02/13/2014   HGB 11.6 02/13/2014   HCT 35.0 02/13/2014   MCV 106.1* 02/13/2014   PLT 212 02/13/2014     Chemistry      Component Value Date/Time   NA 139 02/13/2014 1318   NA 140 01/16/2014 1302   NA 139 12/02/2013 1226   K 3.5 02/13/2014 1318   K 3.9 01/16/2014 1302   CL 101 01/16/2014 1302   CO2 27 02/13/2014 1318   CO2 26 01/16/2014 1302   BUN 11.0 02/13/2014 1318   BUN 10 01/16/2014 1302   BUN 9 12/02/2013 1226   CREATININE 1.0 02/13/2014 1318   CREATININE 0.75 01/16/2014 1302   CREATININE 0.81 10/24/2012 1203      Component Value Date/Time   CALCIUM 9.9 02/13/2014 1318   CALCIUM 10.5 01/16/2014 1302   ALKPHOS 81 02/13/2014 1318   ALKPHOS 73 01/16/2014 1302   AST 28 02/13/2014 1318   AST 27 01/16/2014 1302   ALT 26 02/13/2014 1318   ALT 27 01/16/2014 1302   BILITOT 0.36 02/13/2014 1318   BILITOT 0.5 01/16/2014 1302      EXAM:  CT ABDOMEN AND PELVIS WITH CONTRAST  TECHNIQUE:  Multidetector CT imaging of the abdomen and pelvis was performed  using the standard protocol following bolus administration of  intravenous contrast.  CONTRAST: OMNIPAQUE IOHEXOL 300 MG/ML SOLN  COMPARISON: 12/29/2013  FINDINGS:  No pleural or pericardial effusion identified. 5 mm nodule in the  left lower lobe is  unchanged, image 1/series 5.  Index subcapsular lesion within the left hepatic lobe measures 2.1  cm, image 12/series 2. Previously this measured 2.3 cm. Index lesion  within the lateral segment of left hepatic lobe measures 1.8 cm,  image 22/ series 2. Previously this measured 1.9 cm. Lesion within  the medial segment of the left hepatic lobe measures 2.3 cm, image  22/series 2. Previously 2.5 cm. Dominant mass adjacent to falciform  ligament and gallbladder fossa measures 8.3 by 5.2 cm, image 30/  series 2. Previously 8.6 x 5.7 cm. The gallbladder appears normal.  No biliary dilatation. Normal appearance of the pancreas. Normal  appearance of the spleen. 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  Portal vein remains patent.  Normal appearance of both adrenal glands. The right kidney is  normal. The left kidney is also  within normal limits. Urinary  bladder appears normal. The uterus and right adnexa appears normal.  Asymmetric enlargement of the left ovary appears similar to previous  exam.  Calcified atherosclerotic disease involves the abdominal aorta.  There is no aneurysm. No retroperitoneal adenopathy. There is no  pelvic or inguinal adenopathy. No free fluid or fluid collections  within the abdomen or pelvis.  No free fluid or fluid collections within the abdomen or pelvis. The  stomach appears normal. The small bowel loops are normal in course  and caliber. The appendix is visualized and appears normal. Normal  appearance of the colon.  No evidence for peritoneal nodule or mass identified. Review of the  visualized osseous structures is significant for multi level lumbar  spondylosis. The patient is status post hardware fixation of L3  through L5. There is advanced degenerative disc disease at L5-S1.  IMPRESSION:  1. Mild decrease in size of index lesions within the liver.  2. No evidence for new or progressive disease within the abdomen or  pelvis.  3. Stable 5 mm left lower  lobe lung nodule.  4. Persistent asymmetric enlargement of the left ovary. Etiology  uncertain. This could be better assessed with pelvic sonogram.     Impression and Plan:   63 year old woman with the following issues:  1. 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 She is on chemotherapy with gemcitabine 1000 mg day 1 and 15 of a 28 day cycle. She continues to tolerate chemotherapy without any complications. CT scan from 12/31/2013 continue to show excellent response. The plan is to continue with the same dose and regimen and repeat scan in 2 months.  2. IV access: Status post Port-A-Cath placement on 09/03/2013. No complications since the last visit the   3. Antiemetics: Continue Zofran as needed   4. Constipation: She uses MiraLax with excellent response.  5. Followup: She will return on 10/29 for chemotherapy , and on 11/12  for chemotherapy and evaluation.  Kaynan Klonowski E, PA-C  10/15/20155:10 PM

## 2014-02-13 NOTE — Patient Instructions (Signed)
[facility] Discharge Instructions for Patients Receiving Chemotherapy  Today you received the following chemotherapy agents Gemzar.  To help prevent nausea and vomiting after your treatment, we encourage you to take your nausea medication as prescribed.   If you develop nausea and vomiting that is not controlled by your nausea medication, call the clinic.   BELOW ARE SYMPTOMS THAT SHOULD BE REPORTED IMMEDIATELY:  *FEVER GREATER THAN 100.5 F  *CHILLS WITH OR WITHOUT FEVER  NAUSEA AND VOMITING THAT IS NOT CONTROLLED WITH YOUR NAUSEA MEDICATION  *UNUSUAL SHORTNESS OF BREATH  *UNUSUAL BRUISING OR BLEEDING  TENDERNESS IN MOUTH AND THROAT WITH OR WITHOUT PRESENCE OF ULCERS  *URINARY PROBLEMS  *BOWEL PROBLEMS  UNUSUAL RASH Items with * indicate a potential emergency and should be followed up as soon as possible.  Feel free to call the clinic you have any questions or concerns. The clinic phone number is [phone].    

## 2014-02-14 ENCOUNTER — Telehealth: Payer: Self-pay | Admitting: Oncology

## 2014-02-14 ENCOUNTER — Other Ambulatory Visit: Payer: Self-pay

## 2014-02-14 NOTE — Telephone Encounter (Signed)
s.w. pt and advised on OCT and NOV appt....sed added tx.Marland KitchenMarland KitchenMarland KitchenMarland Kitchenpt ok adn aware

## 2014-02-15 NOTE — Patient Instructions (Signed)
Continue lab and chemotherapy as scheduled Follow up with Dr. Alen Blew in 1 month

## 2014-02-27 ENCOUNTER — Other Ambulatory Visit: Payer: Self-pay | Admitting: Oncology

## 2014-02-27 ENCOUNTER — Ambulatory Visit (HOSPITAL_BASED_OUTPATIENT_CLINIC_OR_DEPARTMENT_OTHER): Payer: 59

## 2014-02-27 ENCOUNTER — Other Ambulatory Visit (HOSPITAL_BASED_OUTPATIENT_CLINIC_OR_DEPARTMENT_OTHER): Payer: 59

## 2014-02-27 VITALS — BP 118/59 | HR 79 | Temp 98.0°F | Resp 16

## 2014-02-27 DIAGNOSIS — C787 Secondary malignant neoplasm of liver and intrahepatic bile duct: Secondary | ICD-10-CM

## 2014-02-27 DIAGNOSIS — C801 Malignant (primary) neoplasm, unspecified: Secondary | ICD-10-CM

## 2014-02-27 DIAGNOSIS — Z5111 Encounter for antineoplastic chemotherapy: Secondary | ICD-10-CM

## 2014-02-27 DIAGNOSIS — C221 Intrahepatic bile duct carcinoma: Secondary | ICD-10-CM

## 2014-02-27 LAB — CBC WITH DIFFERENTIAL/PLATELET
BASO%: 0.8 % (ref 0.0–2.0)
Basophils Absolute: 0.1 10*3/uL (ref 0.0–0.1)
EOS ABS: 0.1 10*3/uL (ref 0.0–0.5)
EOS%: 1.5 % (ref 0.0–7.0)
HEMATOCRIT: 37.1 % (ref 34.8–46.6)
HGB: 12.1 g/dL (ref 11.6–15.9)
LYMPH%: 16.7 % (ref 14.0–49.7)
MCH: 34.5 pg — ABNORMAL HIGH (ref 25.1–34.0)
MCHC: 32.7 g/dL (ref 31.5–36.0)
MCV: 105.6 fL — AB (ref 79.5–101.0)
MONO#: 0.6 10*3/uL (ref 0.1–0.9)
MONO%: 9.4 % (ref 0.0–14.0)
NEUT#: 4.6 10*3/uL (ref 1.5–6.5)
NEUT%: 71.6 % (ref 38.4–76.8)
Platelets: 248 10*3/uL (ref 145–400)
RBC: 3.51 10*6/uL — AB (ref 3.70–5.45)
RDW: 19 % — ABNORMAL HIGH (ref 11.2–14.5)
WBC: 6.4 10*3/uL (ref 3.9–10.3)
lymph#: 1.1 10*3/uL (ref 0.9–3.3)

## 2014-02-27 LAB — COMPREHENSIVE METABOLIC PANEL (CC13)
ALK PHOS: 84 U/L (ref 40–150)
ALT: 32 U/L (ref 0–55)
ANION GAP: 8 meq/L (ref 3–11)
AST: 28 U/L (ref 5–34)
Albumin: 4 g/dL (ref 3.5–5.0)
BUN: 16.8 mg/dL (ref 7.0–26.0)
CO2: 27 meq/L (ref 22–29)
CREATININE: 1 mg/dL (ref 0.6–1.1)
Calcium: 9.9 mg/dL (ref 8.4–10.4)
Chloride: 104 mEq/L (ref 98–109)
GLUCOSE: 93 mg/dL (ref 70–140)
Potassium: 3.5 mEq/L (ref 3.5–5.1)
SODIUM: 139 meq/L (ref 136–145)
Total Bilirubin: 0.43 mg/dL (ref 0.20–1.20)
Total Protein: 7.2 g/dL (ref 6.4–8.3)

## 2014-02-27 MED ORDER — SODIUM CHLORIDE 0.9 % IV SOLN
1000.0000 mg/m2 | Freq: Once | INTRAVENOUS | Status: AC
  Administered 2014-02-27: 1938 mg via INTRAVENOUS
  Filled 2014-02-27: qty 50.97

## 2014-02-27 MED ORDER — HEPARIN SOD (PORK) LOCK FLUSH 100 UNIT/ML IV SOLN
500.0000 [IU] | Freq: Once | INTRAVENOUS | Status: AC | PRN
  Administered 2014-02-27: 500 [IU]
  Filled 2014-02-27: qty 5

## 2014-02-27 MED ORDER — PROCHLORPERAZINE MALEATE 10 MG PO TABS
ORAL_TABLET | ORAL | Status: AC
  Filled 2014-02-27: qty 1

## 2014-02-27 MED ORDER — SODIUM CHLORIDE 0.9 % IV SOLN
Freq: Once | INTRAVENOUS | Status: AC
  Administered 2014-02-27: 15:00:00 via INTRAVENOUS

## 2014-02-27 MED ORDER — PROCHLORPERAZINE MALEATE 10 MG PO TABS
10.0000 mg | ORAL_TABLET | Freq: Once | ORAL | Status: AC
  Administered 2014-02-27: 10 mg via ORAL

## 2014-02-27 MED ORDER — SODIUM CHLORIDE 0.9 % IJ SOLN
10.0000 mL | INTRAMUSCULAR | Status: DC | PRN
  Administered 2014-02-27: 10 mL
  Filled 2014-02-27: qty 10

## 2014-02-27 NOTE — Progress Notes (Signed)
Discharged, ambulatory at 1610 in no distress.

## 2014-02-27 NOTE — Patient Instructions (Signed)
[facility] Discharge Instructions for Patients Receiving Chemotherapy  Today you received the following chemotherapy agent: Gemzar   To help prevent nausea and vomiting after your treatment, we encourage you to take your nausea medication as prescribed.    If you develop nausea and vomiting that is not controlled by your nausea medication, call the clinic.   BELOW ARE SYMPTOMS THAT SHOULD BE REPORTED IMMEDIATELY:  *FEVER GREATER THAN 100.5 F  *CHILLS WITH OR WITHOUT FEVER  NAUSEA AND VOMITING THAT IS NOT CONTROLLED WITH YOUR NAUSEA MEDICATION  *UNUSUAL SHORTNESS OF BREATH  *UNUSUAL BRUISING OR BLEEDING  TENDERNESS IN MOUTH AND THROAT WITH OR WITHOUT PRESENCE OF ULCERS  *URINARY PROBLEMS  *BOWEL PROBLEMS  UNUSUAL RASH Items with * indicate a potential emergency and should be followed up as soon as possible.  Feel free to call the clinic you have any questions or concerns. The clinic phone number is [phone].    

## 2014-02-28 ENCOUNTER — Telehealth: Payer: Self-pay | Admitting: Nurse Practitioner

## 2014-02-28 NOTE — Telephone Encounter (Signed)
Appointment given for 11/4 at 2:45 with Ronnald Collum.

## 2014-03-01 ENCOUNTER — Other Ambulatory Visit: Payer: Self-pay | Admitting: Nurse Practitioner

## 2014-03-05 ENCOUNTER — Ambulatory Visit (INDEPENDENT_AMBULATORY_CARE_PROVIDER_SITE_OTHER): Payer: 59 | Admitting: Nurse Practitioner

## 2014-03-05 ENCOUNTER — Ambulatory Visit: Payer: 59 | Admitting: Nurse Practitioner

## 2014-03-05 ENCOUNTER — Encounter: Payer: Self-pay | Admitting: Nurse Practitioner

## 2014-03-05 VITALS — BP 110/67 | HR 68 | Temp 97.3°F | Ht 65.0 in | Wt 176.0 lb

## 2014-03-05 DIAGNOSIS — K5730 Diverticulosis of large intestine without perforation or abscess without bleeding: Secondary | ICD-10-CM

## 2014-03-05 DIAGNOSIS — M1009 Idiopathic gout, multiple sites: Secondary | ICD-10-CM

## 2014-03-05 DIAGNOSIS — E785 Hyperlipidemia, unspecified: Secondary | ICD-10-CM

## 2014-03-05 DIAGNOSIS — C220 Liver cell carcinoma: Secondary | ICD-10-CM

## 2014-03-05 DIAGNOSIS — E039 Hypothyroidism, unspecified: Secondary | ICD-10-CM

## 2014-03-05 DIAGNOSIS — I10 Essential (primary) hypertension: Secondary | ICD-10-CM

## 2014-03-05 HISTORY — DX: Diverticulosis of large intestine without perforation or abscess without bleeding: K57.30

## 2014-03-05 MED ORDER — ALLOPURINOL 300 MG PO TABS: ORAL_TABLET | ORAL | Status: DC

## 2014-03-05 MED ORDER — SYNTHROID 88 MCG PO TABS: ORAL_TABLET | ORAL | Status: DC

## 2014-03-05 MED ORDER — HYDROCODONE-ACETAMINOPHEN 5-325 MG PO TABS: 1.0000 | ORAL_TABLET | Freq: Four times a day (QID) | ORAL | Status: DC | PRN

## 2014-03-05 MED ORDER — LISINOPRIL-HYDROCHLOROTHIAZIDE 20-12.5 MG PO TABS: 1.0000 | ORAL_TABLET | Freq: Every evening | ORAL | Status: DC

## 2014-03-05 NOTE — Progress Notes (Signed)
Subjective:    Patient ID: Lindsay Aguirre, female    DOB: [birth year], 63 y.o.   MRN: [id number]  Patient here today for follow up of chronic medical problems. 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 She had a scan in august.   Hyperlipidemia This is a chronic problem. The current episode started more than 1 year ago. The problem is controlled. Recent lipid tests were reviewed and are normal. Pertinent negatives include no chest pain, myalgias or shortness of breath. She is currently on no antihyperlipidemic treatment. The current treatment provides mild improvement of lipids. There are no compliance problems.  Risk factors for coronary artery disease include dyslipidemia, hypertension and post-menopausal.  Hypertension This is a chronic problem. The current episode started more than 1 year ago. The problem is controlled. Pertinent negatives include no chest pain, headaches, palpitations or shortness of breath. Risk factors for coronary artery disease include post-menopausal state and dyslipidemia. Past treatments include diuretics. The current treatment provides mild improvement. There are no compliance problems.  Hypertensive end-organ damage includes a thyroid problem.  Thyroid Problem Presents for follow-up visit. Patient reports no cold intolerance, constipation, fatigue, menstrual problem, palpitations or visual change. Her past medical history is significant for hyperlipidemia.  GOUT Allopurinol- daily- Hasn't had a flare up in .6 years.    Review of Systems  Constitutional: Negative for fatigue.  Respiratory: Negative for shortness of breath.   Cardiovascular: Negative for chest pain and palpitations.  Gastrointestinal: Negative for constipation.  Endocrine: Negative for cold intolerance.  Genitourinary: Negative for menstrual problem.  Musculoskeletal: Negative for myalgias.  Neurological: Negative for headaches.  All other  systems reviewed and are negative.      Objective:   Physical Exam  Constitutional: She is oriented to person, place, and time. She appears well-developed and well-nourished.  HENT:  Head: Normocephalic.  Eyes: Pupils are equal, round, and reactive to light.  Neck: Normal range of motion.  Cardiovascular: Normal rate and regular rhythm.  Exam reveals no gallop and no friction rub.   No murmur heard. Pulmonary/Chest: Effort normal and breath sounds normal. No respiratory distress. She has no wheezes. She has no rales. She exhibits no tenderness.  Abdominal: Soft. She exhibits no distension. There is no tenderness.  Musculoskeletal: Normal range of motion.  Neurological: She is alert and oriented to person, place, and time. She has normal reflexes.  Skin: Skin is warm.  Psychiatric: She has a normal mood and affect. Her behavior is normal. Judgment and thought content normal.     BP 110/67 mmHg  Pulse 68  Temp(Src) 97.3 F (36.3 C) (Oral)  Ht 5\' 5"  (1.651 m)  Wt 176 lb (79.833 kg)  BMI 29.29 kg/m2      Assessment & Plan:  1. Essential hypertension Low NA+ diet - lisinopril-hydrochlorothiazide (PRINZIDE,ZESTORETIC) 20-12.5 MG per tablet; Take 1 tablet by mouth every evening.  Dispense: 30 tablet; Refill: 5  2. Hyperlipidemia Low fat diet - NMR, lipoprofile  3. Hypothyroidism, unspecified hypothyroidism type - SYNTHROID 88 MCG tablet; TAKE 1 TABLET (88 MCG TOTAL) BY MOUTH DAILY BEFORE BREAKFAST.  Dispense: 30 tablet; Refill: 5  4. Acute idiopathic gout of multiple sites - allopurinol (ZYLOPRIM) 300 MG tablet; TAKE 1 TABLET (300 MG TOTAL) BY MOUTH DAILY.  Dispense: 30 tablet; Refill: 2  5. Diverticulosis of large intestine without hemorrhage  6. HCC (hepatocellular carcinoma) Continue chemo - HYDROcodone-acetaminophen (NORCO/VICODIN) 5-325 MG per tablet; Take 1 tablet by mouth  every 6 (six) hours as needed for moderate pain.  Dispense: 30 tablet; Refill: 0    Labs  pending Health maintenance reviewed Diet and exercise encouraged Continue all meds Follow up  In 3 months   [place], FNP

## 2014-03-05 NOTE — Patient Instructions (Signed)

## 2014-03-06 LAB — NMR, LIPOPROFILE
CHOLESTEROL: 222 mg/dL — AB (ref 100–199)
HDL Cholesterol by NMR: 51 mg/dL (ref >39)
HDL Particle Number: 23.1 umol/L — ABNORMAL LOW (ref >=30.5)
LDL PARTICLE NUMBER: 1690 nmol/L — AB (ref <1000)
LDL Size: 21.1 nm (ref >20.5)
LDL-C: 142 mg/dL — ABNORMAL HIGH (ref 0–99)
LP-IR Score: <25 (ref <=45)
Small LDL Particle Number: <90 nmol/L (ref <=527)
TRIGLYCERIDES BY NMR: 143 mg/dL (ref 0–149)

## 2014-03-13 ENCOUNTER — Encounter: Payer: Self-pay | Admitting: Physician Assistant

## 2014-03-13 ENCOUNTER — Ambulatory Visit (HOSPITAL_BASED_OUTPATIENT_CLINIC_OR_DEPARTMENT_OTHER): Payer: 59

## 2014-03-13 ENCOUNTER — Other Ambulatory Visit: Payer: Self-pay | Admitting: Oncology

## 2014-03-13 ENCOUNTER — Other Ambulatory Visit (HOSPITAL_BASED_OUTPATIENT_CLINIC_OR_DEPARTMENT_OTHER): Payer: 59

## 2014-03-13 ENCOUNTER — Telehealth: Payer: Self-pay | Admitting: Physician Assistant

## 2014-03-13 ENCOUNTER — Ambulatory Visit (HOSPITAL_BASED_OUTPATIENT_CLINIC_OR_DEPARTMENT_OTHER): Payer: 59 | Admitting: Physician Assistant

## 2014-03-13 VITALS — BP 123/62 | HR 81 | Temp 98.2°F | Resp 19 | Ht 65.0 in | Wt 177.1 lb

## 2014-03-13 DIAGNOSIS — Z5111 Encounter for antineoplastic chemotherapy: Secondary | ICD-10-CM

## 2014-03-13 DIAGNOSIS — C221 Intrahepatic bile duct carcinoma: Secondary | ICD-10-CM

## 2014-03-13 DIAGNOSIS — C801 Malignant (primary) neoplasm, unspecified: Secondary | ICD-10-CM

## 2014-03-13 DIAGNOSIS — C787 Secondary malignant neoplasm of liver and intrahepatic bile duct: Secondary | ICD-10-CM

## 2014-03-13 DIAGNOSIS — K5900 Constipation, unspecified: Secondary | ICD-10-CM

## 2014-03-13 LAB — CBC WITH DIFFERENTIAL/PLATELET
BASO%: 0.8 % (ref 0.0–2.0)
BASOS ABS: 0.1 10*3/uL (ref 0.0–0.1)
EOS ABS: 0.1 10*3/uL (ref 0.0–0.5)
EOS%: 2 % (ref 0.0–7.0)
HEMATOCRIT: 34.8 % (ref 34.8–46.6)
HEMOGLOBIN: 11.4 g/dL — AB (ref 11.6–15.9)
LYMPH#: 1.2 10*3/uL (ref 0.9–3.3)
LYMPH%: 16.9 % (ref 14.0–49.7)
MCH: 34.6 pg — AB (ref 25.1–34.0)
MCHC: 32.8 g/dL (ref 31.5–36.0)
MCV: 105.4 fL — ABNORMAL HIGH (ref 79.5–101.0)
MONO#: 0.5 10*3/uL (ref 0.1–0.9)
MONO%: 7.4 % (ref 0.0–14.0)
NEUT%: 72.9 % (ref 38.4–76.8)
NEUTROS ABS: 5 10*3/uL (ref 1.5–6.5)
Platelets: 213 10*3/uL (ref 145–400)
RBC: 3.31 10*6/uL — ABNORMAL LOW (ref 3.70–5.45)
RDW: 18.2 % — ABNORMAL HIGH (ref 11.2–14.5)
WBC: 6.9 10*3/uL (ref 3.9–10.3)

## 2014-03-13 LAB — COMPREHENSIVE METABOLIC PANEL (CC13)
ALBUMIN: 3.8 g/dL (ref 3.5–5.0)
ALT: 22 U/L (ref 0–55)
AST: 25 U/L (ref 5–34)
Alkaline Phosphatase: 78 U/L (ref 40–150)
Anion Gap: 5 mEq/L (ref 3–11)
BUN: 13 mg/dL (ref 7.0–26.0)
CALCIUM: 9.7 mg/dL (ref 8.4–10.4)
CHLORIDE: 104 meq/L (ref 98–109)
CO2: 29 mEq/L (ref 22–29)
CREATININE: 1 mg/dL (ref 0.6–1.1)
Glucose: 112 mg/dl (ref 70–140)
POTASSIUM: 3.7 meq/L (ref 3.5–5.1)
Sodium: 139 mEq/L (ref 136–145)
Total Bilirubin: 0.37 mg/dL (ref 0.20–1.20)
Total Protein: 7 g/dL (ref 6.4–8.3)

## 2014-03-13 MED ORDER — PROCHLORPERAZINE MALEATE 10 MG PO TABS
10.0000 mg | ORAL_TABLET | Freq: Once | ORAL | Status: AC
  Administered 2014-03-13: 10 mg via ORAL

## 2014-03-13 MED ORDER — PROCHLORPERAZINE MALEATE 10 MG PO TABS
ORAL_TABLET | ORAL | Status: AC
  Filled 2014-03-13: qty 1

## 2014-03-13 MED ORDER — HEPARIN SOD (PORK) LOCK FLUSH 100 UNIT/ML IV SOLN
500.0000 [IU] | Freq: Once | INTRAVENOUS | Status: AC | PRN
  Administered 2014-03-13: 500 [IU]
  Filled 2014-03-13: qty 5

## 2014-03-13 MED ORDER — SODIUM CHLORIDE 0.9 % IV SOLN
Freq: Once | INTRAVENOUS | Status: AC
  Administered 2014-03-13: 15:00:00 via INTRAVENOUS

## 2014-03-13 MED ORDER — SODIUM CHLORIDE 0.9 % IV SOLN
1000.0000 mg/m2 | Freq: Once | INTRAVENOUS | Status: AC
  Administered 2014-03-13: 1938 mg via INTRAVENOUS
  Filled 2014-03-13: qty 50.97

## 2014-03-13 MED ORDER — SODIUM CHLORIDE 0.9 % IJ SOLN
10.0000 mL | INTRAMUSCULAR | Status: DC | PRN
  Administered 2014-03-13: 10 mL
  Filled 2014-03-13: qty 10

## 2014-03-13 NOTE — Progress Notes (Signed)
Hematology and Oncology Follow Up Visit  Lindsay Aguirre [id number] Sep 19, 1950 63 y.o. 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 This was confirmed by biopsy on 08/14/2013 and imaging studies showed multifocal hepatic lesions.   Prior Therapy: Status post a liver biopsy on 08/14/2013.  Current therapy: Gemcitabine at 1000 mg per meter squared given weekly 3 weeks on and one week off. She is status post 6 cycles. She is currently receiving her chemotherapy every other week.  Interim History: Mrs. Gutter presents for followup. Since her last visit, she reports no new complaints and she is tolerating chemotherapy relatively well.  She reports grade 1 nausea that occurs about 2 to 3 days after chemotherapy. Nausea has been manageable with anti-emetics.  She continues to have an excellent quality of life without any new complications. She is not reporting any abdominal pain or hematochezia. Is not reporting any vomiting with her mild nausea. She reports no fever, chills, shortness of breath chest pain, night sweats or diarrhea.  Has not reported any hematochezia or melena. Has not reported any decline in her energy level or performance status. She continues to perform her activities of daily living without any decline. She has not reported any syncope or neurological changes. She has not reported any arthralgias or myalgias. Did not report any skin rashes or lesions. Did not report any lymphadenopathy or petechiae. She had no infusion-related complications with chemotherapy.  Remainder of her review of system is unremarkable.  Medications: I have reviewed the patient's current medications. Unchanged by my review. Current Outpatient Prescriptions  Medication Sig Dispense Refill  . allopurinol (ZYLOPRIM) 300 MG tablet TAKE 1 TABLET (300 MG TOTAL) BY  MOUTH DAILY. 30 tablet 2  . bifidobacterium infantis (ALIGN) capsule Take 1 capsule by mouth daily.    [name] BIOTIN PO Take 1 tablet by mouth daily.    . Cholecalciferol (VITAMIN D) 2000 UNITS tablet Take 2,000 Units by mouth daily.    Lindsay Aguirre Sodium (COLACE PO) Take 3 capsules by mouth daily.    [name] HYDROcodone-acetaminophen (NORCO/VICODIN) 5-325 MG per tablet Take 1 tablet by mouth every 6 (six) hours as needed for moderate pain. 30 tablet 0  . ibuprofen (ADVIL,MOTRIN) 800 MG tablet   1  . lidocaine-prilocaine (EMLA) cream Apply 1 application topically as needed. Apply cream, on days of treatment, to [facility] site 1-2 hours prior to treatment. 30 g 0  . lisinopril-hydrochlorothiazide (PRINZIDE,ZESTORETIC) 20-12.5 MG per tablet Take 1 tablet by mouth every evening. 30 tablet 5  . ondansetron (ZOFRAN) 8 MG tablet Take 1 tablet (8 mg total) by mouth every 8 (eight) hours as needed for nausea or vomiting. 30 tablet 0  . polyethylene glycol powder (GLYCOLAX/MIRALAX) powder TAKE 1 CONTAINER TOTAL ONCE AS DIRECTED 255 g 1  . senna (SENOKOT) 8.6 MG TABS tablet Take 2 tablets (17.2 mg total) by mouth daily. 60 each 0  . SYNTHROID 88 MCG tablet TAKE 1 TABLET (88 MCG TOTAL) BY MOUTH DAILY BEFORE BREAKFAST. 30 tablet 5  . vitamin C (ASCORBIC ACID) 500 MG tablet Take 500 mg by mouth daily.     No current facility-administered medications for this visit.   Facility-Administered Medications Ordered in Other Visits  Medication Dose Route Frequency Provider Last Rate Last Dose  . Gemcitabine HCl (GEMZAR) 1,938 mg in sodium chloride 0.9 % 100 mL chemo infusion  1,000 mg/m2 (Treatment  Plan Actual) Intravenous Once Lindsay Slipper, PA-C 302 mL/hr at 03/13/14 1505 1,938 mg at 03/13/14 1505  . heparin lock flush 100 unit/mL  500 Units Intracatheter Once PRN Lindsay Gorgas E Zamara Cozad, PA-C      . sodium chloride 0.9 % injection 10 mL  10 mL Intracatheter PRN Lindsay Slipper, PA-C         Allergies:  Allergies  Allergen  Reactions  . Livalo [Pitavastatin]     Leg cramps  . Simvastatin     Leg cramps  . Statins     Leg Cramps     Past Medical History, Surgical history, Social history, and Family History were reviewed and updated.  Physical Exam: Blood pressure 123/62, pulse 81, temperature 98.2 F (36.8 C), temperature source Oral, resp. rate 19, height 5\' 5"  (1.651 m), weight 177 lb 1.6 oz (80.332 kg). ECOG: 0 General appearance: alert and cooperative Head: Normocephalic, without obvious abnormality Neck: no adenopathy Lymph nodes: Cervical, supraclavicular, and axillary nodes normal. Heart:regular rate and rhythm, S1, S2.  Lung:chest clear, no wheezing. Dullness to percussion. Abdomin: soft, non-tender, without masses or organomegaly EXT:no erythema, induration, or edema Skin: No rashes or lesions.  Lab Results: Lab Results  Component Value Date   WBC 6.9 03/13/2014   HGB 11.4* 03/13/2014   HCT 34.8 03/13/2014   MCV 105.4* 03/13/2014   PLT 213 03/13/2014     Chemistry      Component Value Date/Time   NA 139 03/13/2014 1308   NA 140 01/16/2014 1302   NA 139 12/02/2013 1226   K 3.7 03/13/2014 1308   K 3.9 01/16/2014 1302   CL 101 01/16/2014 1302   CO2 29 03/13/2014 1308   CO2 26 01/16/2014 1302   BUN 13.0 03/13/2014 1308   BUN 10 01/16/2014 1302   BUN 9 12/02/2013 1226   CREATININE 1.0 03/13/2014 1308   CREATININE 0.75 01/16/2014 1302   CREATININE 0.81 10/24/2012 1203      Component Value Date/Time   CALCIUM 9.7 03/13/2014 1308   CALCIUM 10.5 01/16/2014 1302   ALKPHOS 78 03/13/2014 1308   ALKPHOS 73 01/16/2014 1302   AST 25 03/13/2014 1308   AST 27 01/16/2014 1302   ALT 22 03/13/2014 1308   ALT 27 01/16/2014 1302   BILITOT 0.37 03/13/2014 1308   BILITOT 0.5 01/16/2014 1302      EXAM:  CT ABDOMEN AND PELVIS WITH CONTRAST  TECHNIQUE:  Multidetector CT imaging of the abdomen and pelvis was performed  using the standard protocol following bolus administration of   intravenous contrast.  CONTRAST: OMNIPAQUE IOHEXOL 300 MG/ML SOLN  COMPARISON: 12/29/2013  FINDINGS:  No pleural or pericardial effusion identified. 5 mm nodule in the  left lower lobe is unchanged, image 1/series 5.  Index subcapsular lesion within the left hepatic lobe measures 2.1  cm, image 12/series 2. Previously this measured 2.3 cm. Index lesion  within the lateral segment of left hepatic lobe measures 1.8 cm,  image 22/ series 2. Previously this measured 1.9 cm. Lesion within  the medial segment of the left hepatic lobe measures 2.3 cm, image  22/series 2. Previously 2.5 cm. Dominant mass adjacent to falciform  ligament and gallbladder fossa measures 8.3 by 5.2 cm, image 30/  series 2. Previously 8.6 x 5.7 cm. The gallbladder appears normal.  No biliary dilatation. Normal appearance of the pancreas. Normal  appearance of the spleen. 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  Portal vein remains patent.  Normal appearance of both  adrenal glands. The right kidney is  normal. The left kidney is also within normal limits. Urinary  bladder appears normal. The uterus and right adnexa appears normal.  Asymmetric enlargement of the left ovary appears similar to previous  exam.  Calcified atherosclerotic disease involves the abdominal aorta.  There is no aneurysm. No retroperitoneal adenopathy. There is no  pelvic or inguinal adenopathy. No free fluid or fluid collections  within the abdomen or pelvis.  No free fluid or fluid collections within the abdomen or pelvis. The  stomach appears normal. The small bowel loops are normal in course  and caliber. The appendix is visualized and appears normal. Normal  appearance of the colon.  No evidence for peritoneal nodule or mass identified. Review of the  visualized osseous structures is significant for multi level lumbar  spondylosis. The patient is status post hardware fixation of L3  through L5. There is advanced degenerative disc  disease at L5-S1.  IMPRESSION:  1. Mild decrease in size of index lesions within the liver.  2. No evidence for new or progressive disease within the abdomen or  pelvis.  3. Stable 5 mm left lower lobe lung nodule.  4. Persistent asymmetric enlargement of the left ovary. Etiology  uncertain. This could be better assessed with pelvic sonogram.     Impression and Plan:   63 year old woman with the following issues:  1. 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 She is on chemotherapy with gemcitabine 1000 mg day 1 and 15 of a 28 day cycle. She continues to tolerate chemotherapy without any complications. CT scan from 12/31/2013 continue to show excellent response. The plan is to continue with the same dose and regimen and repeat scan in 1 months.  2. IV access: Status post Port-A-Cath placement on 09/03/2013. No complications since the last visit, no evidence of infection   3. Antiemetics: Continue Zofran as needed   4. Constipation: She uses MiraLax with excellent response.  5. Followup: She will return on 11/25 for chemotherapy , and on 12/10  for chemotherapy and evaluation.  Tiana Loft E, PA-C  11/12/20153:33 PM

## 2014-03-13 NOTE — Telephone Encounter (Signed)
Pt confirmed labs/ov per 11/12 POF, sent msg to add chemo, gave pt AVS.... KJ

## 2014-03-13 NOTE — Patient Instructions (Signed)
[facility] Discharge Instructions for Patients Receiving Chemotherapy  Today you received the following chemotherapy agents Gemcitabine.   To help prevent nausea and vomiting after your treatment, we encourage you to take your nausea medication as directed.    If you develop nausea and vomiting that is not controlled by your nausea medication, call the clinic.   BELOW ARE SYMPTOMS THAT SHOULD BE REPORTED IMMEDIATELY:  *FEVER GREATER THAN 100.5 F  *CHILLS WITH OR WITHOUT FEVER  NAUSEA AND VOMITING THAT IS NOT CONTROLLED WITH YOUR NAUSEA MEDICATION  *UNUSUAL SHORTNESS OF BREATH  *UNUSUAL BRUISING OR BLEEDING  TENDERNESS IN MOUTH AND THROAT WITH OR WITHOUT PRESENCE OF ULCERS  *URINARY PROBLEMS  *BOWEL PROBLEMS  UNUSUAL RASH Items with * indicate a potential emergency and should be followed up as soon as possible.  Feel free to call the clinic you have any questions or concerns. The clinic phone number is [phone].    

## 2014-03-16 MED ORDER — ONDANSETRON HCL 8 MG PO TABS: 8.0000 mg | ORAL_TABLET | Freq: Three times a day (TID) | ORAL | Status: DC | PRN

## 2014-03-16 NOTE — Patient Instructions (Signed)
Continue labs and chemotherapy is scheduled Follow-up in one month for another symptom management visit

## 2014-03-26 ENCOUNTER — Other Ambulatory Visit: Payer: Self-pay | Admitting: Oncology

## 2014-03-26 ENCOUNTER — Telehealth: Payer: Self-pay | Admitting: Oncology

## 2014-03-26 ENCOUNTER — Ambulatory Visit (HOSPITAL_BASED_OUTPATIENT_CLINIC_OR_DEPARTMENT_OTHER): Payer: 59 | Admitting: Lab

## 2014-03-26 ENCOUNTER — Ambulatory Visit (HOSPITAL_BASED_OUTPATIENT_CLINIC_OR_DEPARTMENT_OTHER): Payer: 59

## 2014-03-26 ENCOUNTER — Telehealth: Payer: Self-pay | Admitting: *Deleted

## 2014-03-26 DIAGNOSIS — C221 Intrahepatic bile duct carcinoma: Secondary | ICD-10-CM

## 2014-03-26 DIAGNOSIS — Z5111 Encounter for antineoplastic chemotherapy: Secondary | ICD-10-CM

## 2014-03-26 LAB — COMPREHENSIVE METABOLIC PANEL (CC13)
ALT: 24 U/L (ref 0–55)
ANION GAP: 9 meq/L (ref 3–11)
AST: 24 U/L (ref 5–34)
Albumin: 3.9 g/dL (ref 3.5–5.0)
Alkaline Phosphatase: 78 U/L (ref 40–150)
BUN: 12.5 mg/dL (ref 7.0–26.0)
CO2: 28 meq/L (ref 22–29)
CREATININE: 0.9 mg/dL (ref 0.6–1.1)
Calcium: 10.1 mg/dL (ref 8.4–10.4)
Chloride: 103 mEq/L (ref 98–109)
GLUCOSE: 85 mg/dL (ref 70–140)
POTASSIUM: 3.8 meq/L (ref 3.5–5.1)
Sodium: 139 mEq/L (ref 136–145)
Total Bilirubin: 0.44 mg/dL (ref 0.20–1.20)
Total Protein: 7.1 g/dL (ref 6.4–8.3)

## 2014-03-26 LAB — CBC WITH DIFFERENTIAL/PLATELET
BASO%: 0.4 % (ref 0.0–2.0)
BASOS ABS: 0 10*3/uL (ref 0.0–0.1)
EOS ABS: 0.1 10*3/uL (ref 0.0–0.5)
EOS%: 1.5 % (ref 0.0–7.0)
HEMATOCRIT: 36.5 % (ref 34.8–46.6)
HEMOGLOBIN: 12.1 g/dL (ref 11.6–15.9)
LYMPH#: 1.5 10*3/uL (ref 0.9–3.3)
LYMPH%: 21.6 % (ref 14.0–49.7)
MCH: 34.6 pg — AB (ref 25.1–34.0)
MCHC: 33 g/dL (ref 31.5–36.0)
MCV: 104.7 fL — ABNORMAL HIGH (ref 79.5–101.0)
MONO#: 1 10*3/uL — AB (ref 0.1–0.9)
MONO%: 13.9 % (ref 0.0–14.0)
NEUT%: 62.6 % (ref 38.4–76.8)
NEUTROS ABS: 4.3 10*3/uL (ref 1.5–6.5)
Platelets: 229 10*3/uL (ref 145–400)
RBC: 3.49 10*6/uL — ABNORMAL LOW (ref 3.70–5.45)
RDW: 18 % — AB (ref 11.2–14.5)
WBC: 6.8 10*3/uL (ref 3.9–10.3)

## 2014-03-26 MED ORDER — PROCHLORPERAZINE MALEATE 10 MG PO TABS
ORAL_TABLET | ORAL | Status: AC
  Filled 2014-03-26: qty 1

## 2014-03-26 MED ORDER — SODIUM CHLORIDE 0.9 % IV SOLN
Freq: Once | INTRAVENOUS | Status: AC
  Administered 2014-03-26: 12:00:00 via INTRAVENOUS

## 2014-03-26 MED ORDER — HEPARIN SOD (PORK) LOCK FLUSH 100 UNIT/ML IV SOLN
500.0000 [IU] | Freq: Once | INTRAVENOUS | Status: AC | PRN
  Administered 2014-03-26: 500 [IU]
  Filled 2014-03-26: qty 5

## 2014-03-26 MED ORDER — PROCHLORPERAZINE MALEATE 10 MG PO TABS
10.0000 mg | ORAL_TABLET | Freq: Once | ORAL | Status: AC
  Administered 2014-03-26: 10 mg via ORAL

## 2014-03-26 MED ORDER — SODIUM CHLORIDE 0.9 % IJ SOLN
10.0000 mL | INTRAMUSCULAR | Status: DC | PRN
  Administered 2014-03-26: 10 mL
  Filled 2014-03-26: qty 10

## 2014-03-26 MED ORDER — SODIUM CHLORIDE 0.9 % IV SOLN
1000.0000 mg/m2 | Freq: Once | INTRAVENOUS | Status: AC
  Administered 2014-03-26: 1938 mg via INTRAVENOUS
  Filled 2014-03-26: qty 50.97

## 2014-03-26 NOTE — Telephone Encounter (Signed)
Per staff message and POF I have scheduled appts. Advised scheduler of appts. JMW  

## 2014-03-26 NOTE — Telephone Encounter (Signed)
Gave avs & cal for Dec/Jan. Sent mess to sch tx °

## 2014-03-26 NOTE — Patient Instructions (Signed)
Lindsay Aguirre Discharge Instructions for Patients Receiving Chemotherapy  Today you received the following chemotherapy agents: Gemzar. To help prevent nausea and vomiting after your treatment, we encourage you to take your nausea medication: Zofran 8 mg every 8  Hours as needed.   If you develop nausea and vomiting that is not controlled by your nausea medication, call the clinic.   BELOW ARE SYMPTOMS THAT SHOULD BE REPORTED IMMEDIATELY:  *FEVER GREATER THAN 100.5 F  *CHILLS WITH OR WITHOUT FEVER  NAUSEA AND VOMITING THAT IS NOT CONTROLLED WITH YOUR NAUSEA MEDICATION  *UNUSUAL SHORTNESS OF BREATH  *UNUSUAL BRUISING OR BLEEDING  TENDERNESS IN MOUTH AND THROAT WITH OR WITHOUT PRESENCE OF ULCERS  *URINARY PROBLEMS  *BOWEL PROBLEMS  UNUSUAL RASH Items with * indicate a potential emergency and should be followed up as soon as possible.  Feel free to call the clinic you have any questions or concerns. The clinic phone number is [phone].

## 2014-03-26 NOTE — Telephone Encounter (Signed)
Lm to confirm that all tx has been added to appt and mailed new cal.

## 2014-04-04 ENCOUNTER — Encounter: Payer: Self-pay | Admitting: Physician Assistant

## 2014-04-04 ENCOUNTER — Ambulatory Visit (INDEPENDENT_AMBULATORY_CARE_PROVIDER_SITE_OTHER): Payer: Medicare Other | Admitting: Physician Assistant

## 2014-04-04 VITALS — BP 119/66 | HR 99 | Temp 96.4°F | Ht 65.0 in | Wt 174.0 lb

## 2014-04-04 DIAGNOSIS — N3090 Cystitis, unspecified without hematuria: Secondary | ICD-10-CM

## 2014-04-04 DIAGNOSIS — R300 Dysuria: Secondary | ICD-10-CM

## 2014-04-04 LAB — POCT UA - MICROSCOPIC ONLY
CASTS, UR, LPF, POC: NEGATIVE
Crystals, Ur, HPF, POC: NEGATIVE
MUCUS UA: NEGATIVE
RBC, urine, microscopic: NEGATIVE
Yeast, UA: NEGATIVE

## 2014-04-04 LAB — POCT URINALYSIS DIPSTICK
Bilirubin, UA: NEGATIVE
GLUCOSE UA: NEGATIVE
Ketones, UA: NEGATIVE
Nitrite, UA: NEGATIVE
Protein, UA: NEGATIVE
RBC UA: NEGATIVE
Spec Grav, UA: 1.02
UROBILINOGEN UA: NEGATIVE
pH, UA: 5

## 2014-04-04 MED ORDER — NITROFURANTOIN MONOHYD MACRO 100 MG PO CAPS: 100.0000 mg | ORAL_CAPSULE | Freq: Two times a day (BID) | ORAL | Status: DC

## 2014-04-04 NOTE — Progress Notes (Signed)
   Subjective:    Patient ID: Lindsay Aguirre, female    DOB: 25-Apr-1951, 63 y.o.   MRN: [id number]  HPI 63 y/o female presents with c/o urinary frequency, burning, nausea and right flank pain x 2 days. She has comorbid Liver CA.    Review of Systems  Constitutional: Negative.   Gastrointestinal: Positive for nausea.  Genitourinary: Positive for dysuria, frequency and flank pain (right ).       Objective:   Physical Exam  Pulmonary/Chest: Effort normal and breath sounds normal.  Abdominal: Soft. Bowel sounds are normal. She exhibits no distension and no mass. There is no tenderness. There is no rebound and no guarding.  Vitals reviewed.  U/A positive for trace leukocytes suggesting cystitis.         Assessment & Plan:  1. Cystitis: Rx Macrobid 100 mg BID x 5 days. Drink plenty of fluids. I have also suggested patient f/u with her oncologist since she is currently undergoing chemothereapy for her comorbid liver CA

## 2014-04-08 ENCOUNTER — Other Ambulatory Visit: Payer: Self-pay | Admitting: Nurse Practitioner

## 2014-04-10 ENCOUNTER — Ambulatory Visit (HOSPITAL_BASED_OUTPATIENT_CLINIC_OR_DEPARTMENT_OTHER): Payer: Medicare Other | Admitting: Physician Assistant

## 2014-04-10 ENCOUNTER — Encounter: Payer: Self-pay | Admitting: Physician Assistant

## 2014-04-10 ENCOUNTER — Other Ambulatory Visit (HOSPITAL_BASED_OUTPATIENT_CLINIC_OR_DEPARTMENT_OTHER): Payer: Medicare Other

## 2014-04-10 ENCOUNTER — Ambulatory Visit (HOSPITAL_BASED_OUTPATIENT_CLINIC_OR_DEPARTMENT_OTHER): Payer: Medicare Other

## 2014-04-10 VITALS — BP 129/68 | HR 84 | Temp 98.2°F | Resp 19 | Ht 66.0 in | Wt 174.9 lb

## 2014-04-10 DIAGNOSIS — C787 Secondary malignant neoplasm of liver and intrahepatic bile duct: Secondary | ICD-10-CM

## 2014-04-10 DIAGNOSIS — C801 Malignant (primary) neoplasm, unspecified: Secondary | ICD-10-CM

## 2014-04-10 DIAGNOSIS — C221 Intrahepatic bile duct carcinoma: Secondary | ICD-10-CM

## 2014-04-10 DIAGNOSIS — Z5111 Encounter for antineoplastic chemotherapy: Secondary | ICD-10-CM

## 2014-04-10 LAB — COMPREHENSIVE METABOLIC PANEL (CC13)
ALT: 24 U/L (ref 0–55)
AST: 23 U/L (ref 5–34)
Albumin: 4 g/dL (ref 3.5–5.0)
Alkaline Phosphatase: 82 U/L (ref 40–150)
Anion Gap: 10 mEq/L (ref 3–11)
BUN: 15.5 mg/dL (ref 7.0–26.0)
CO2: 30 mEq/L — ABNORMAL HIGH (ref 22–29)
Calcium: 10.2 mg/dL (ref 8.4–10.4)
Chloride: 98 mEq/L (ref 98–109)
Creatinine: 1 mg/dL (ref 0.6–1.1)
EGFR: 56 mL/min/{1.73_m2} — ABNORMAL LOW (ref >90)
Glucose: 85 mg/dl (ref 70–140)
Potassium: 3.6 mEq/L (ref 3.5–5.1)
Sodium: 138 mEq/L (ref 136–145)
Total Bilirubin: 0.5 mg/dL (ref 0.20–1.20)
Total Protein: 7 g/dL (ref 6.4–8.3)

## 2014-04-10 LAB — CBC WITH DIFFERENTIAL/PLATELET
BASO%: 0.6 % (ref 0.0–2.0)
Basophils Absolute: 0 10*3/uL (ref 0.0–0.1)
EOS%: 1.8 % (ref 0.0–7.0)
Eosinophils Absolute: 0.1 10*3/uL (ref 0.0–0.5)
HCT: 35 % (ref 34.8–46.6)
HGB: 11.8 g/dL (ref 11.6–15.9)
LYMPH%: 21.9 % (ref 14.0–49.7)
MCH: 34.7 pg — ABNORMAL HIGH (ref 25.1–34.0)
MCHC: 33.7 g/dL (ref 31.5–36.0)
MCV: 102.9 fL — ABNORMAL HIGH (ref 79.5–101.0)
MONO#: 0.7 10*3/uL (ref 0.1–0.9)
MONO%: 9.9 % (ref 0.0–14.0)
NEUT#: 4.5 10*3/uL (ref 1.5–6.5)
NEUT%: 65.8 % (ref 38.4–76.8)
Platelets: 219 10*3/uL (ref 145–400)
RBC: 3.4 10*6/uL — ABNORMAL LOW (ref 3.70–5.45)
RDW: 16.4 % — ABNORMAL HIGH (ref 11.2–14.5)
WBC: 6.8 10*3/uL (ref 3.9–10.3)
lymph#: 1.5 10*3/uL (ref 0.9–3.3)

## 2014-04-10 MED ORDER — HEPARIN SOD (PORK) LOCK FLUSH 100 UNIT/ML IV SOLN
500.0000 [IU] | Freq: Once | INTRAVENOUS | Status: AC | PRN
  Administered 2014-04-10: 500 [IU]
  Filled 2014-04-10: qty 5

## 2014-04-10 MED ORDER — SODIUM CHLORIDE 0.9 % IV SOLN
1000.0000 mg/m2 | Freq: Once | INTRAVENOUS | Status: AC
  Administered 2014-04-10: 1938 mg via INTRAVENOUS
  Filled 2014-04-10: qty 50.97

## 2014-04-10 MED ORDER — PROCHLORPERAZINE MALEATE 10 MG PO TABS
ORAL_TABLET | ORAL | Status: AC
  Filled 2014-04-10: qty 1

## 2014-04-10 MED ORDER — SODIUM CHLORIDE 0.9 % IJ SOLN
10.0000 mL | INTRAMUSCULAR | Status: DC | PRN
  Administered 2014-04-10: 10 mL
  Filled 2014-04-10: qty 10

## 2014-04-10 MED ORDER — PROCHLORPERAZINE MALEATE 10 MG PO TABS
10.0000 mg | ORAL_TABLET | Freq: Once | ORAL | Status: AC
  Administered 2014-04-10: 10 mg via ORAL

## 2014-04-10 NOTE — Patient Instructions (Addendum)
Continue labs and chemotherapy as scheduled  Follow up in 2 weeks 

## 2014-04-10 NOTE — Progress Notes (Signed)
Hematology and Oncology Follow Up Visit  Lindsay Aguirre [id number] 05/01/1951 63 y.o. 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 This was confirmed by biopsy on 08/14/2013 and imaging studies showed multifocal hepatic lesions.   Prior Therapy: Status post a liver biopsy on 08/14/2013.  Current therapy: Gemcitabine at 1000 mg per meter squared given weekly 3 weeks on and one week off. She is status post 6 cycles. She is currently receiving her chemotherapy every other week.  Interim History: Lindsay Aguirre presents for followup. Since her last visit, she reports no new complaints and she is tolerating chemotherapy relatively well.  She reports grade 1 nausea that occurs about 2 to 3 days after chemotherapy. Nausea has been manageable with anti-emetics.She does not require a refill of her anti-emetic at this time.  She continues to have an excellent quality of life without any new complications. She is not reporting any abdominal pain or hematochezia. Is not reporting any vomiting with her mild nausea. She reports no fever, chills, shortness of breath chest pain, night sweats or diarrhea.  Has not reported any hematochezia or melena. Has not reported any decline in her energy level or performance status. She continues to perform her activities of daily living without any decline. She has not reported any syncope or neurological changes. She has not reported any arthralgias or myalgias. Did not report any skin rashes or lesions. Did not report any lymphadenopathy or petechiae. She had no infusion-related complications with chemotherapy.  Remainder of her review of system is unremarkable.  Medications: I have reviewed the patient's current medications. Unchanged by my review. Current Outpatient Prescriptions  Medication Sig Dispense Refill  . allopurinol  (ZYLOPRIM) 300 MG tablet TAKE 1 TABLET (300 MG TOTAL) BY MOUTH DAILY. 30 tablet 2  . bifidobacterium infantis (ALIGN) capsule Take 1 capsule by mouth daily.    [name] BIOTIN PO Take 1 tablet by mouth daily.    . Cholecalciferol (VITAMIN D) 2000 UNITS tablet Take 2,000 Units by mouth daily.    Tery Sanfilippo Sodium (COLACE PO) Take 3 capsules by mouth daily.    [name] HYDROcodone-acetaminophen (NORCO/VICODIN) 5-325 MG per tablet Take 1 tablet by mouth every 6 (six) hours as needed for moderate pain. 30 tablet 0  . ibuprofen (ADVIL,MOTRIN) 800 MG tablet   1  . lidocaine-prilocaine (EMLA) cream Apply 1 application topically as needed. Apply cream, on days of treatment, to [facility] site 1-2 hours prior to treatment. 30 g 0  . lisinopril-hydrochlorothiazide (PRINZIDE,ZESTORETIC) 20-12.5 MG per tablet Take 1 tablet by mouth every evening. 30 tablet 5  . ondansetron (ZOFRAN) 8 MG tablet Take 1 tablet (8 mg total) by mouth every 8 (eight) hours as needed for nausea or vomiting. 30 tablet 1  . polyethylene glycol powder (GLYCOLAX/MIRALAX) powder TAKE 1 CONTAINER TOTAL ONCE AS DIRECTED 255 g 1  . senna (SENOKOT) 8.6 MG TABS tablet Take 2 tablets (17.2 mg total) by mouth daily. 60 each 0  . SYNTHROID 88 MCG tablet TAKE 1 TABLET (88 MCG TOTAL) BY MOUTH DAILY BEFORE BREAKFAST. 30 tablet 5  . vitamin C (ASCORBIC ACID) 500 MG tablet Take 500 mg by mouth daily.    . nitrofurantoin, macrocrystal-monohydrate, (MACROBID) 100 MG capsule Take 1 capsule (100 mg total) by mouth 2 (two) times daily. (Patient not taking: Reported on 04/10/2014) 10 capsule 0   No current facility-administered medications for  this visit.   Facility-Administered Medications Ordered in Other Visits  Medication Dose Route Frequency Provider Last Rate Last Dose  . sodium chloride 0.9 % injection 10 mL  10 mL Intracatheter PRN Benjiman Core, MD   10 mL at 04/10/14 1502     Allergies:  Allergies  Allergen Reactions  . Livalo [Pitavastatin]     Leg cramps   . Simvastatin     Leg cramps  . Statins     Leg Cramps     Past Medical History, Surgical history, Social history, and Family History were reviewed and updated.  Physical Exam: Blood pressure 129/68, pulse 84, temperature 98.2 F (36.8 C), temperature source Oral, resp. rate 19, height 5\' 6"  (1.676 m), weight 174 lb 14.4 oz (79.334 kg), SpO2 99 %. ECOG: 0 General appearance: alert and cooperative Head: Normocephalic, without obvious abnormality Neck: no adenopathy Lymph nodes: Cervical, supraclavicular, and axillary nodes normal. Heart:regular rate and rhythm, S1, S2.  Lung:chest clear, no wheezing. Dullness to percussion. Abdomin: soft, non-tender, without masses or organomegaly EXT:no erythema, induration, or edema Skin: No rashes or lesions.  Lab Results: Lab Results  Component Value Date   WBC 6.8 04/10/2014   HGB 11.8 04/10/2014   HCT 35.0 04/10/2014   MCV 102.9* 04/10/2014   PLT 219 04/10/2014     Chemistry      Component Value Date/Time   NA 138 04/10/2014 1311   NA 140 01/16/2014 1302   NA 139 12/02/2013 1226   K 3.6 04/10/2014 1311   K 3.9 01/16/2014 1302   CL 101 01/16/2014 1302   CO2 30* 04/10/2014 1311   CO2 26 01/16/2014 1302   BUN 15.5 04/10/2014 1311   BUN 10 01/16/2014 1302   BUN 9 12/02/2013 1226   CREATININE 1.0 04/10/2014 1311   CREATININE 0.75 01/16/2014 1302   CREATININE 0.81 10/24/2012 1203      Component Value Date/Time   CALCIUM 10.2 04/10/2014 1311   CALCIUM 10.5 01/16/2014 1302   ALKPHOS 82 04/10/2014 1311   ALKPHOS 73 01/16/2014 1302   AST 23 04/10/2014 1311   AST 27 01/16/2014 1302   ALT 24 04/10/2014 1311   ALT 27 01/16/2014 1302   BILITOT 0.50 04/10/2014 1311   BILITOT 0.5 01/16/2014 1302      EXAM:  CT ABDOMEN AND PELVIS WITH CONTRAST  TECHNIQUE:  Multidetector CT imaging of the abdomen and pelvis was performed  using the standard protocol following bolus administration of  intravenous contrast.  CONTRAST:  OMNIPAQUE IOHEXOL 300 MG/ML SOLN  COMPARISON: 12/29/2013  FINDINGS:  No pleural or pericardial effusion identified. 5 mm nodule in the  left lower lobe is unchanged, image 1/series 5.  Index subcapsular lesion within the left hepatic lobe measures 2.1  cm, image 12/series 2. Previously this measured 2.3 cm. Index lesion  within the lateral segment of left hepatic lobe measures 1.8 cm,  image 22/ series 2. Previously this measured 1.9 cm. Lesion within  the medial segment of the left hepatic lobe measures 2.3 cm, image  22/series 2. Previously 2.5 cm. Dominant mass adjacent to falciform  ligament and gallbladder fossa measures 8.3 by 5.2 cm, image 30/  series 2. Previously 8.6 x 5.7 cm. The gallbladder appears normal.  No biliary dilatation. Normal appearance of the pancreas. Normal  appearance of the spleen. 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  Portal vein remains patent.  Normal appearance of both adrenal glands. The right kidney is  normal. The left kidney is also  within normal limits. Urinary  bladder appears normal. The uterus and right adnexa appears normal.  Asymmetric enlargement of the left ovary appears similar to previous  exam.  Calcified atherosclerotic disease involves the abdominal aorta.  There is no aneurysm. No retroperitoneal adenopathy. There is no  pelvic or inguinal adenopathy. No free fluid or fluid collections  within the abdomen or pelvis.  No free fluid or fluid collections within the abdomen or pelvis. The  stomach appears normal. The small bowel loops are normal in course  and caliber. The appendix is visualized and appears normal. Normal  appearance of the colon.  No evidence for peritoneal nodule or mass identified. Review of the  visualized osseous structures is significant for multi level lumbar  spondylosis. The patient is status post hardware fixation of L3  through L5. There is advanced degenerative disc disease at L5-S1.  IMPRESSION:  1. Mild  decrease in size of index lesions within the liver.  2. No evidence for new or progressive disease within the abdomen or  pelvis.  3. Stable 5 mm left lower lobe lung nodule.  4. Persistent asymmetric enlargement of the left ovary. Etiology  uncertain. This could be better assessed with pelvic sonogram.     Impression and Plan:   63 year old woman with the following issues:  1. 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 She is on chemotherapy with gemcitabine 1000 mg day 1 and 15 of a 28 day cycle. She continues to tolerate chemotherapy without any complications. CT scan from 12/31/2013 continue to show excellent response. The plan is to continue with the same dose and regimen and repeat scan in 1 months.  2. IV access: Status post Port-A-Cath placement on 09/03/2013. No complications since the last visit, no evidence of infection   3. Antiemetics: Continue Zofran as needed   4. Constipation: She uses MiraLax with excellent response.  5. Followup: She will return on 12/23 and on 05/08/2014  for chemotherapy and evaluation.  Laural Benes, Ameya Kutz E, PA-C  12/10/20154:17 PM

## 2014-04-10 NOTE — Patient Instructions (Signed)
[facility] Discharge Instructions for Patients Receiving Chemotherapy  Today you received the following chemotherapy agents Gemzar.  To help prevent nausea and vomiting after your treatment, we encourage you to take your nausea medication.   If you develop nausea and vomiting that is not controlled by your nausea medication, call the clinic.   BELOW ARE SYMPTOMS THAT SHOULD BE REPORTED IMMEDIATELY:  *FEVER GREATER THAN 100.5 F  *CHILLS WITH OR WITHOUT FEVER  NAUSEA AND VOMITING THAT IS NOT CONTROLLED WITH YOUR NAUSEA MEDICATION  *UNUSUAL SHORTNESS OF BREATH  *UNUSUAL BRUISING OR BLEEDING  TENDERNESS IN MOUTH AND THROAT WITH OR WITHOUT PRESENCE OF ULCERS  *URINARY PROBLEMS  *BOWEL PROBLEMS  UNUSUAL RASH Items with * indicate a potential emergency and should be followed up as soon as possible.  Feel free to call the clinic you have any questions or concerns. The clinic phone number is [phone].    

## 2014-04-18 ENCOUNTER — Telehealth: Payer: Self-pay | Admitting: Physician Assistant

## 2014-04-18 NOTE — Telephone Encounter (Signed)
Remailed cal to temp address.

## 2014-04-21 ENCOUNTER — Encounter: Payer: Self-pay | Admitting: Nurse Practitioner

## 2014-04-21 ENCOUNTER — Ambulatory Visit (INDEPENDENT_AMBULATORY_CARE_PROVIDER_SITE_OTHER): Payer: Medicare Other | Admitting: Nurse Practitioner

## 2014-04-21 VITALS — BP 100/59 | HR 77 | Temp 98.2°F | Ht 66.0 in | Wt 178.6 lb

## 2014-04-21 DIAGNOSIS — J069 Acute upper respiratory infection, unspecified: Secondary | ICD-10-CM

## 2014-04-21 MED ORDER — BENZONATATE 100 MG PO CAPS: 100.0000 mg | ORAL_CAPSULE | Freq: Two times a day (BID) | ORAL | Status: DC | PRN

## 2014-04-21 MED ORDER — AMOXICILLIN 875 MG PO TABS
875.0000 mg | ORAL_TABLET | Freq: Two times a day (BID) | ORAL | Status: DC
Start: 2014-04-21 — End: 2014-05-22

## 2014-04-21 NOTE — Progress Notes (Signed)
   Subjective:    Patient ID: Lindsay Aguirre, female    DOB: 1951-01-29, 63 y.o.   MRN: [id number]  HPI Patient in today c/o cough and congestion. Patient is currently doing chemo for liver cancer. SHe has been feeling sick for over a week.     Review of Systems  Constitutional: Negative for fever, chills and appetite change.  HENT: Positive for congestion and rhinorrhea.   Respiratory: Positive for cough (nonproductive).   Cardiovascular: Negative.   Gastrointestinal: Negative.   Genitourinary: Negative.   Neurological: Negative.   Psychiatric/Behavioral: Negative.   All other systems reviewed and are negative.      Objective:   Physical Exam  Constitutional: She appears well-developed and well-nourished.  HENT:  Right Ear: Hearing, tympanic membrane, external ear and ear canal normal.  Left Ear: Hearing, tympanic membrane, external ear and ear canal normal.  Nose: Mucosal edema and rhinorrhea present. Right sinus exhibits no maxillary sinus tenderness and no frontal sinus tenderness. Left sinus exhibits no maxillary sinus tenderness and no frontal sinus tenderness.  Mouth/Throat: Uvula is midline, oropharynx is clear and moist and mucous membranes are normal.  Cardiovascular: Normal rate, regular rhythm and normal heart sounds.   Pulmonary/Chest: Effort normal and breath sounds normal.  Deep dry cough  Neurological: She is alert.  Skin: Skin is warm and dry.  Psychiatric: She has a normal mood and affect. Her behavior is normal. Judgment and thought content normal.   BP 100/59 mmHg  Pulse 77  Temp(Src) 98.2 F (36.8 C) (Oral)  Ht 5\' 6"  (1.676 m)  Wt 178 lb 9.6 oz (81.012 kg)  BMI 28.84 kg/m2        Assessment & Plan:  1. Upper respiratory infection with cough and congestion 1. Take meds as prescribed 2. Use a cool mist humidifier especially during the winter months and when heat has been humid. 3. Use saline nose sprays frequently 4. Saline irrigations of the  nose can be very helpful if done frequently.  * 4X daily for 1 week*  * Use of a nettie pot can be helpful with this. Follow directions with this* 5. Drink plenty of fluids 6. Keep thermostat turn down low 7.For any cough or congestion  Use plain Mucinex- regular strength or max strength is fine   * Children- consult with Pharmacist for dosing 8. For fever or aces or pains- take tylenol or ibuprofen appropriate for age and weight.  * for fevers greater than 101 orally you may alternate ibuprofen and tylenol every  3 hours.   - amoxicillin (AMOXIL) 875 MG tablet; Take 1 tablet (875 mg total) by mouth 2 (two) times daily.  Dispense: 20 tablet; Refill: 0 - benzonatate (TESSALON) 100 MG capsule; Take 1 capsule (100 mg total) by mouth 2 (two) times daily as needed for cough.  Dispense: 20 capsule; Refill: 0  [name] Hassell Done, FNP

## 2014-04-21 NOTE — Patient Instructions (Signed)
Upper Respiratory Infection, Adult An upper respiratory infection (URI) is also sometimes known as the common cold. The upper respiratory tract includes the nose, sinuses, throat, trachea, and bronchi. Bronchi are the airways leading to the lungs. Most people improve within 1 week, but symptoms can last up to 2 weeks. A residual cough may last even longer.  CAUSES Many different viruses can infect the tissues lining the upper respiratory tract. The tissues become irritated and inflamed and often become very moist. Mucus production is also common. A cold is contagious. You can easily spread the virus to others by oral contact. This includes kissing, sharing a glass, coughing, or sneezing. Touching your mouth or nose and then touching a surface, which is then touched by another person, can also spread the virus. SYMPTOMS  Symptoms typically develop 1 to 3 days after you come in contact with a cold virus. Symptoms vary from person to person. They may include:  Runny nose.  Sneezing.  Nasal congestion.  Sinus irritation.  Sore throat.  Loss of voice (laryngitis).  Cough.  Fatigue.  Muscle aches.  Loss of appetite.  Headache.  Low-grade fever. DIAGNOSIS  You might diagnose your own cold based on familiar symptoms, since most people get a cold 2 to 3 times a year. Your caregiver can confirm this based on your exam. Most importantly, your caregiver can check that your symptoms are not due to another disease such as strep throat, sinusitis, pneumonia, asthma, or epiglottitis. Blood tests, throat tests, and X-rays are not necessary to diagnose a common cold, but they may sometimes be helpful in excluding other more serious diseases. Your caregiver will decide if any further tests are required. RISKS AND COMPLICATIONS  You may be at risk for a more severe case of the common cold if you smoke cigarettes, have chronic heart disease (such as heart failure) or lung disease (such as asthma), or if  you have a weakened immune system. The very young and very old are also at risk for more serious infections. Bacterial sinusitis, middle ear infections, and bacterial pneumonia can complicate the common cold. The common cold can worsen asthma and chronic obstructive pulmonary disease (COPD). Sometimes, these complications can require emergency medical care and may be life-threatening. PREVENTION  The best way to protect against getting a cold is to practice good hygiene. Avoid oral or hand contact with people with cold symptoms. Wash your hands often if contact occurs. There is no clear evidence that vitamin C, vitamin E, echinacea, or exercise reduces the chance of developing a cold. However, it is always recommended to get plenty of rest and practice good nutrition. TREATMENT  Treatment is directed at relieving symptoms. There is no cure. Antibiotics are not effective, because the infection is caused by a virus, not by bacteria. Treatment may include:  Increased fluid intake. Sports drinks offer valuable electrolytes, sugars, and fluids.  Breathing heated mist or steam (vaporizer or shower).  Eating chicken soup or other clear broths, and maintaining good nutrition.  Getting plenty of rest.  Using gargles or lozenges for comfort.  Controlling fevers with ibuprofen or acetaminophen as directed by your caregiver.  Increasing usage of your inhaler if you have asthma. Zinc gel and zinc lozenges, taken in the first 24 hours of the common cold, can shorten the duration and lessen the severity of symptoms. Pain medicines may help with fever, muscle aches, and throat pain. A variety of non-prescription medicines are available to treat congestion and runny nose. Your caregiver   can make recommendations and may suggest nasal or lung inhalers for other symptoms.  HOME CARE INSTRUCTIONS   Only take over-the-counter or prescription medicines for pain, discomfort, or fever as directed by your  caregiver.  Use a warm mist humidifier or inhale steam from a shower to increase air moisture. This may keep secretions moist and make it easier to breathe.  Drink enough water and fluids to keep your urine clear or pale yellow.  Rest as needed.  Return to work when your temperature has returned to normal or as your caregiver advises. You may need to stay home longer to avoid infecting others. You can also use a face mask and careful hand washing to prevent spread of the virus. SEEK MEDICAL CARE IF:   After the first few days, you feel you are getting worse rather than better.  You need your caregiver's advice about medicines to control symptoms.  You develop chills, worsening shortness of breath, or brown or red sputum. These may be signs of pneumonia.  You develop yellow or brown nasal discharge or pain in the face, especially when you bend forward. These may be signs of sinusitis.  You develop a fever, swollen neck glands, pain with swallowing, or white areas in the back of your throat. These may be signs of strep throat. SEEK IMMEDIATE MEDICAL CARE IF:   You have a fever.  You develop severe or persistent headache, ear pain, sinus pain, or chest pain.  You develop wheezing, a prolonged cough, cough up blood, or have a change in your usual mucus (if you have chronic lung disease).  You develop sore muscles or a stiff neck. Document Released: 10/12/2000 Document Revised: 07/11/2011 Document Reviewed: 07/24/2013 Exit[employer] Patient Information 2015 [employer]. This information is not intended to replace advice given to you by your health care provider. Make sure you discuss any questions you have with your health care provider.  

## 2014-04-23 ENCOUNTER — Ambulatory Visit: Payer: 59

## 2014-04-23 ENCOUNTER — Telehealth: Payer: Self-pay | Admitting: *Deleted

## 2014-04-23 ENCOUNTER — Ambulatory Visit: Payer: 59 | Admitting: Physician Assistant

## 2014-04-23 ENCOUNTER — Other Ambulatory Visit: Payer: Self-pay | Admitting: Physician Assistant

## 2014-04-23 ENCOUNTER — Other Ambulatory Visit: Payer: 59

## 2014-04-23 NOTE — Telephone Encounter (Signed)
Received phone call from patient stating she is not feeling well today.  She seen her PCP earlier this week for a cold and has started taking an antibiotic and cough medicine. Patient is wanting to know if she should come in today for appointment with Awilda Metro, PA and Infusion.  Informed Awilda Metro, PA.  Patient can skip today's appointments and return on 05/08/13 for next treatment and to see [place], Per Awilda Metro, PA.  Patient verbalized understanding.

## 2014-05-08 ENCOUNTER — Other Ambulatory Visit (HOSPITAL_BASED_OUTPATIENT_CLINIC_OR_DEPARTMENT_OTHER): Payer: Medicare Other

## 2014-05-08 ENCOUNTER — Ambulatory Visit (HOSPITAL_BASED_OUTPATIENT_CLINIC_OR_DEPARTMENT_OTHER): Payer: Medicare Other

## 2014-05-08 ENCOUNTER — Ambulatory Visit (HOSPITAL_BASED_OUTPATIENT_CLINIC_OR_DEPARTMENT_OTHER): Payer: Medicare Other | Admitting: Oncology

## 2014-05-08 ENCOUNTER — Telehealth: Payer: Self-pay | Admitting: Oncology

## 2014-05-08 VITALS — BP 124/60 | HR 81 | Temp 98.2°F | Resp 18 | Ht 66.0 in | Wt 179.2 lb

## 2014-05-08 DIAGNOSIS — C221 Intrahepatic bile duct carcinoma: Secondary | ICD-10-CM

## 2014-05-08 DIAGNOSIS — C801 Malignant (primary) neoplasm, unspecified: Secondary | ICD-10-CM

## 2014-05-08 DIAGNOSIS — C787 Secondary malignant neoplasm of liver and intrahepatic bile duct: Secondary | ICD-10-CM

## 2014-05-08 DIAGNOSIS — Z5111 Encounter for antineoplastic chemotherapy: Secondary | ICD-10-CM

## 2014-05-08 LAB — COMPREHENSIVE METABOLIC PANEL (CC13)
ALT: 20 U/L (ref 0–55)
AST: 26 U/L (ref 5–34)
Albumin: 4 g/dL (ref 3.5–5.0)
Alkaline Phosphatase: 83 U/L (ref 40–150)
Anion Gap: 9 mEq/L (ref 3–11)
BUN: 16 mg/dL (ref 7.0–26.0)
CALCIUM: 10 mg/dL (ref 8.4–10.4)
CO2: 29 meq/L (ref 22–29)
CREATININE: 0.8 mg/dL (ref 0.6–1.1)
Chloride: 101 mEq/L (ref 98–109)
EGFR: 73 mL/min/{1.73_m2} — ABNORMAL LOW (ref >90)
Glucose: 115 mg/dl (ref 70–140)
Potassium: 3.6 mEq/L (ref 3.5–5.1)
Sodium: 140 mEq/L (ref 136–145)
Total Bilirubin: 0.64 mg/dL (ref 0.20–1.20)
Total Protein: 7.1 g/dL (ref 6.4–8.3)

## 2014-05-08 LAB — CBC WITH DIFFERENTIAL/PLATELET
BASO%: 0.4 % (ref 0.0–2.0)
Basophils Absolute: 0 10*3/uL (ref 0.0–0.1)
EOS ABS: 0.1 10*3/uL (ref 0.0–0.5)
EOS%: 0.9 % (ref 0.0–7.0)
HEMATOCRIT: 36.5 % (ref 34.8–46.6)
HGB: 12.1 g/dL (ref 11.6–15.9)
LYMPH%: 18 % (ref 14.0–49.7)
MCH: 34 pg (ref 25.1–34.0)
MCHC: 33.2 g/dL (ref 31.5–36.0)
MCV: 102.5 fL — AB (ref 79.5–101.0)
MONO#: 0.5 10*3/uL (ref 0.1–0.9)
MONO%: 6.5 % (ref 0.0–14.0)
NEUT%: 74.2 % (ref 38.4–76.8)
NEUTROS ABS: 5.6 10*3/uL (ref 1.5–6.5)
PLATELETS: 265 10*3/uL (ref 145–400)
RBC: 3.56 10*6/uL — ABNORMAL LOW (ref 3.70–5.45)
RDW: 15.8 % — ABNORMAL HIGH (ref 11.2–14.5)
WBC: 7.5 10*3/uL (ref 3.9–10.3)
lymph#: 1.4 10*3/uL (ref 0.9–3.3)

## 2014-05-08 MED ORDER — PROCHLORPERAZINE MALEATE 10 MG PO TABS
ORAL_TABLET | ORAL | Status: AC
  Filled 2014-05-08: qty 1

## 2014-05-08 MED ORDER — HEPARIN SOD (PORK) LOCK FLUSH 100 UNIT/ML IV SOLN
500.0000 [IU] | Freq: Once | INTRAVENOUS | Status: AC | PRN
  Administered 2014-05-08: 500 [IU]
  Filled 2014-05-08: qty 5

## 2014-05-08 MED ORDER — PROCHLORPERAZINE MALEATE 10 MG PO TABS
10.0000 mg | ORAL_TABLET | Freq: Once | ORAL | Status: AC
  Administered 2014-05-08: 10 mg via ORAL

## 2014-05-08 MED ORDER — SODIUM CHLORIDE 0.9 % IV SOLN
Freq: Once | INTRAVENOUS | Status: AC
  Administered 2014-05-08: 14:00:00 via INTRAVENOUS

## 2014-05-08 MED ORDER — SODIUM CHLORIDE 0.9 % IJ SOLN
10.0000 mL | INTRAMUSCULAR | Status: DC | PRN
  Administered 2014-05-08: 10 mL
  Filled 2014-05-08: qty 10

## 2014-05-08 MED ORDER — SODIUM CHLORIDE 0.9 % IV SOLN
1000.0000 mg/m2 | Freq: Once | INTRAVENOUS | Status: AC
  Administered 2014-05-08: 1938 mg via INTRAVENOUS
  Filled 2014-05-08: qty 50.97

## 2014-05-08 NOTE — Telephone Encounter (Signed)
gv adn printed appt sched and avs for pt for Jan and Feb....sed added tx....gv pt barium

## 2014-05-08 NOTE — Patient Instructions (Signed)
Cancer Center Discharge Instructions for Patients Receiving Chemotherapy  Today you received the following chemotherapy agents Gemzar.  To help prevent nausea and vomiting after your treatment, we encourage you to take your nausea medication.   If you develop nausea and vomiting that is not controlled by your nausea medication, call the clinic.   BELOW ARE SYMPTOMS THAT SHOULD BE REPORTED IMMEDIATELY:  *FEVER GREATER THAN 100.5 F  *CHILLS WITH OR WITHOUT FEVER  NAUSEA AND VOMITING THAT IS NOT CONTROLLED WITH YOUR NAUSEA MEDICATION  *UNUSUAL SHORTNESS OF BREATH  *UNUSUAL BRUISING OR BLEEDING  TENDERNESS IN MOUTH AND THROAT WITH OR WITHOUT PRESENCE OF ULCERS  *URINARY PROBLEMS  *BOWEL PROBLEMS  UNUSUAL RASH Items with * indicate a potential emergency and should be followed up as soon as possible.  Feel free to call the clinic you have any questions or concerns. The clinic phone number is [phone].    

## 2014-05-08 NOTE — Progress Notes (Signed)
Hematology and Oncology Follow Up Visit  Lindsay Aguirre [id number] Mar 07, 1951 64 y.o. 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 This was confirmed by biopsy on 08/14/2013 and imaging studies showed multifocal hepatic lesions.   Prior Therapy: Status post a liver biopsy on 08/14/2013.  Current therapy: Gemcitabine at 1000 mg per meter squared started on 09/06/2013 givenweekly 3 weeks on and one week off for 6 cycles.  She is currently receiving her chemotherapy every other week.  Interim History: Mrs. Lindsay Aguirre presents for followup. Since her last visit, she is doing very well. She is tolerating chemotherapy with very few complaints. She reports grade 1 nausea that occurs about 2 to 3 days after chemotherapy. Nausea has been manageable with anti-emetics.she does not report any infusion-related complications such as fever or pruritus. She does not report any fatigue and she continues to be active.  She continues to have an excellent quality of life without any new complications. She is not reporting any abdominal pain or hematochezia.  She reports no fever, chills, shortness of breath chest pain, night sweats or diarrhea.  Has not reported any hematochezia or melena. Has not reported any decline in her energy level or performance status. She continues to perform her activities of daily living without any decline. She has not reported any syncope or neurological changes. She has not reported any arthralgias or myalgias. Did not report any skin rashes or lesions. Did not report any lymphadenopathy or petechiae. She had no infusion-related complications with chemotherapy.  Remainder of her review of system is unremarkable.  Medications: I have reviewed the patient's current medications. Unchanged by my review. Current Outpatient Prescriptions   Medication Sig Dispense Refill  . allopurinol (ZYLOPRIM) 300 MG tablet TAKE 1 TABLET (300 MG TOTAL) BY MOUTH DAILY. 30 tablet 2  . amoxicillin (AMOXIL) 875 MG tablet Take 1 tablet (875 mg total) by mouth 2 (two) times daily. 20 tablet 0  . benzonatate (TESSALON) 100 MG capsule Take 1 capsule (100 mg total) by mouth 2 (two) times daily as needed for cough. 20 capsule 0  . bifidobacterium infantis (ALIGN) capsule Take 1 capsule by mouth daily.    [name] BIOTIN PO Take 1 tablet by mouth daily.    . Cholecalciferol (VITAMIN D) 2000 UNITS tablet Take 2,000 Units by mouth daily.    [name] Sodium (COLACE PO) Take 3 capsules by mouth daily.    [name] HYDROcodone-acetaminophen (NORCO/VICODIN) 5-325 MG per tablet Take 1 tablet by mouth every 6 (six) hours as needed for moderate pain. 30 tablet 0  . ibuprofen (ADVIL,MOTRIN) 800 MG tablet   1  . lidocaine-prilocaine (EMLA) cream Apply 1 application topically as needed. Apply cream, on days of treatment, to [facility] site 1-2 hours prior to treatment. 30 g 0  . lisinopril-hydrochlorothiazide (PRINZIDE,ZESTORETIC) 20-12.5 MG per tablet Take 1 tablet by mouth every evening. 30 tablet 5  . ondansetron (ZOFRAN) 8 MG tablet Take 1 tablet (8 mg total) by mouth every 8 (eight) hours as needed for nausea or vomiting. 30 tablet 1  . polyethylene glycol powder (GLYCOLAX/MIRALAX) powder TAKE 1 CONTAINER TOTAL ONCE AS DIRECTED 255 g 1  . senna (SENOKOT) 8.6 MG TABS tablet Take 2 tablets (17.2 mg total) by mouth daily. 60 each 0  . SYNTHROID 88 MCG tablet TAKE 1 TABLET (88 MCG TOTAL) BY MOUTH DAILY BEFORE BREAKFAST. 30 tablet 5  .  vitamin C (ASCORBIC ACID) 500 MG tablet Take 500 mg by mouth daily.     No current facility-administered medications for this visit.     Allergies:  Allergies  Allergen Reactions  . Livalo [Pitavastatin]     Leg cramps  . Simvastatin     Leg cramps  . Statins     Leg Cramps     Past Medical History, Surgical history, Social history, and  Family History were reviewed and updated.  Physical Exam: Blood pressure 124/60, pulse 81, temperature 98.2 F (36.8 C), temperature source Oral, resp. rate 18, height 5\' 6"  (1.676 m), weight 179 lb 3.2 oz (81.285 kg), SpO2 100 %. ECOG: 0 General appearance: alert and cooperative not in any distress. Head: Normocephalic, without obvious abnormality Neck: no adenopathy Lymph nodes: Cervical, supraclavicular, and axillary nodes normal. Heart:regular rate and rhythm, S1, S2.  Lung:chest clear, no wheezing. Dullness to percussion. Abdomin: soft, non-tender, without masses or organomegaly no rebound or guarding. EXT:no erythema, induration, or edema Skin: No rashes or lesions.  Lab Results: Lab Results  Component Value Date   WBC 7.5 05/08/2014   HGB 12.1 05/08/2014   HCT 36.5 05/08/2014   MCV 102.5* 05/08/2014   PLT 265 05/08/2014     Chemistry      Component Value Date/Time   NA 138 04/10/2014 1311   NA 140 01/16/2014 1302   NA 139 12/02/2013 1226   K 3.6 04/10/2014 1311   K 3.9 01/16/2014 1302   CL 101 01/16/2014 1302   CO2 30* 04/10/2014 1311   CO2 26 01/16/2014 1302   BUN 15.5 04/10/2014 1311   BUN 10 01/16/2014 1302   BUN 9 12/02/2013 1226   CREATININE 1.0 04/10/2014 1311   CREATININE 0.75 01/16/2014 1302   CREATININE 0.81 10/24/2012 1203      Component Value Date/Time   CALCIUM 10.2 04/10/2014 1311   CALCIUM 10.5 01/16/2014 1302   ALKPHOS 82 04/10/2014 1311   ALKPHOS 73 01/16/2014 1302   AST 23 04/10/2014 1311   AST 27 01/16/2014 1302   ALT 24 04/10/2014 1311   ALT 27 01/16/2014 1302   BILITOT 0.50 04/10/2014 1311   BILITOT 0.5 01/16/2014 1302      Impression and Plan:   64 year old woman with the following issues:  1. 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 She is on chemotherapy with gemcitabine 1000 mg day 1 and 15 of a 28 day cycle. She continues to tolerate chemotherapy without any  complications. The plan is to continue with the same dose and schedule and repeat imaging studies in February 2016. She continues to have radiographic benefit along with her clinical benefit, we'll continue with the current regimen.  2. IV access: Status post Port-A-Cath placement on 09/03/2013. No complications since the last visit, no evidence of infection   3. Antiemetics: Continue Zofran as needed   4. Constipation: She uses MiraLax with excellent response.  5. Followup: She will in 2 weeks for her next cycle of chemotherapy and then in 4 weeks for a CT scan and an evaluation.  [facility], MD 1/7/20161:09 PM

## 2014-05-22 ENCOUNTER — Ambulatory Visit (HOSPITAL_BASED_OUTPATIENT_CLINIC_OR_DEPARTMENT_OTHER): Payer: Medicare Other

## 2014-05-22 ENCOUNTER — Other Ambulatory Visit (HOSPITAL_BASED_OUTPATIENT_CLINIC_OR_DEPARTMENT_OTHER): Payer: Medicare Other

## 2014-05-22 ENCOUNTER — Ambulatory Visit (HOSPITAL_BASED_OUTPATIENT_CLINIC_OR_DEPARTMENT_OTHER): Payer: Medicare Other | Admitting: Physician Assistant

## 2014-05-22 ENCOUNTER — Encounter: Payer: Self-pay | Admitting: Physician Assistant

## 2014-05-22 VITALS — BP 107/51 | HR 75 | Temp 98.6°F | Resp 18 | Ht 66.0 in | Wt 178.5 lb

## 2014-05-22 DIAGNOSIS — C801 Malignant (primary) neoplasm, unspecified: Secondary | ICD-10-CM

## 2014-05-22 DIAGNOSIS — C787 Secondary malignant neoplasm of liver and intrahepatic bile duct: Secondary | ICD-10-CM

## 2014-05-22 DIAGNOSIS — Z5111 Encounter for antineoplastic chemotherapy: Secondary | ICD-10-CM

## 2014-05-22 DIAGNOSIS — C221 Intrahepatic bile duct carcinoma: Secondary | ICD-10-CM

## 2014-05-22 DIAGNOSIS — K5900 Constipation, unspecified: Secondary | ICD-10-CM

## 2014-05-22 LAB — COMPREHENSIVE METABOLIC PANEL (CC13)
ALBUMIN: 4 g/dL (ref 3.5–5.0)
ALK PHOS: 83 U/L (ref 40–150)
ALT: 20 U/L (ref 0–55)
AST: 25 U/L (ref 5–34)
Anion Gap: 9 mEq/L (ref 3–11)
BUN: 19 mg/dL (ref 7.0–26.0)
CHLORIDE: 101 meq/L (ref 98–109)
CO2: 29 meq/L (ref 22–29)
CREATININE: 1 mg/dL (ref 0.6–1.1)
Calcium: 10 mg/dL (ref 8.4–10.4)
EGFR: 56 mL/min/{1.73_m2} — ABNORMAL LOW (ref >90)
Glucose: 119 mg/dl (ref 70–140)
Potassium: 3.7 mEq/L (ref 3.5–5.1)
SODIUM: 139 meq/L (ref 136–145)
Total Bilirubin: 0.45 mg/dL (ref 0.20–1.20)
Total Protein: 7.2 g/dL (ref 6.4–8.3)

## 2014-05-22 LAB — CBC WITH DIFFERENTIAL/PLATELET
BASO%: 0.6 % (ref 0.0–2.0)
Basophils Absolute: 0 10*3/uL (ref 0.0–0.1)
EOS%: 1.7 % (ref 0.0–7.0)
Eosinophils Absolute: 0.1 10*3/uL (ref 0.0–0.5)
HCT: 36.9 % (ref 34.8–46.6)
HGB: 11.9 g/dL (ref 11.6–15.9)
LYMPH%: 20.9 % (ref 14.0–49.7)
MCH: 33.5 pg (ref 25.1–34.0)
MCHC: 32.3 g/dL (ref 31.5–36.0)
MCV: 103.6 fL — AB (ref 79.5–101.0)
MONO#: 0.5 10*3/uL (ref 0.1–0.9)
MONO%: 6.7 % (ref 0.0–14.0)
NEUT%: 70.1 % (ref 38.4–76.8)
NEUTROS ABS: 5 10*3/uL (ref 1.5–6.5)
Platelets: 224 10*3/uL (ref 145–400)
RBC: 3.57 10*6/uL — AB (ref 3.70–5.45)
RDW: 16.4 % — AB (ref 11.2–14.5)
WBC: 7.1 10*3/uL (ref 3.9–10.3)
lymph#: 1.5 10*3/uL (ref 0.9–3.3)

## 2014-05-22 MED ORDER — SODIUM CHLORIDE 0.9 % IV SOLN
Freq: Once | INTRAVENOUS | Status: AC
  Administered 2014-05-22: 15:00:00 via INTRAVENOUS

## 2014-05-22 MED ORDER — SODIUM CHLORIDE 0.9 % IJ SOLN
10.0000 mL | INTRAMUSCULAR | Status: DC | PRN
  Filled 2014-05-22: qty 10

## 2014-05-22 MED ORDER — POLYETHYLENE GLYCOL 3350 17 GM/SCOOP PO POWD: ORAL | Status: DC

## 2014-05-22 MED ORDER — ONDANSETRON HCL 8 MG PO TABS: 8.0000 mg | ORAL_TABLET | Freq: Three times a day (TID) | ORAL | Status: DC | PRN

## 2014-05-22 MED ORDER — PROCHLORPERAZINE MALEATE 10 MG PO TABS
10.0000 mg | ORAL_TABLET | Freq: Once | ORAL | Status: AC
  Administered 2014-05-22: 10 mg via ORAL

## 2014-05-22 MED ORDER — PROCHLORPERAZINE MALEATE 10 MG PO TABS
ORAL_TABLET | ORAL | Status: AC
  Filled 2014-05-22: qty 1

## 2014-05-22 MED ORDER — HEPARIN SOD (PORK) LOCK FLUSH 100 UNIT/ML IV SOLN
500.0000 [IU] | Freq: Once | INTRAVENOUS | Status: DC | PRN
  Filled 2014-05-22: qty 5

## 2014-05-22 MED ORDER — SODIUM CHLORIDE 0.9 % IV SOLN
1000.0000 mg/m2 | Freq: Once | INTRAVENOUS | Status: AC
  Administered 2014-05-22: 1938 mg via INTRAVENOUS
  Filled 2014-05-22: qty 50.97

## 2014-05-22 NOTE — Patient Instructions (Signed)
Continue labs and chemotherapy as scheduled Follow-up in 2 weeks with a restaging CT scan of your abdomen and pelvis to reevaluate your disease prior to your next scheduled cycle of chemotherapy

## 2014-05-22 NOTE — Progress Notes (Signed)
Hematology and Oncology Follow Up Visit  Lindsay Aguirre [id number] July 01, 1950 64 y.o. 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 This was confirmed by biopsy on 08/14/2013 and imaging studies showed multifocal hepatic lesions.   Prior Therapy: Status post a liver biopsy on 08/14/2013.  Current therapy: Gemcitabine at 1000 mg per meter squared started on 09/06/2013 givenweekly 3 weeks on and one week off for 6 cycles.  She is currently receiving her chemotherapy every other week.  Interim History: Mrs. Corona presents for followup. Since her last visit, she is doing very well. She is tolerating chemotherapy with very few complaints. She reports grade 1 nausea that occurs about 2 to 3 days after chemotherapy. She actually is reporting some nausea today. Nausea has been manageable with anti-emetics. She requests a refill for her Zofran on also for her MiraLax which manages her constipation. She does not report any infusion-related complications such as fever or pruritus. She does not report any fatigue and she continues to be active.  She continues to have an excellent quality of life without any new complications. She is not reporting any abdominal pain or hematochezia.  She reports no fever, chills, shortness of breath chest pain, night sweats or diarrhea.  Has not reported any hematochezia or melena. Has not reported any decline in her energy level or performance status. She continues to perform her activities of daily living without any decline. She has not reported any syncope or neurological changes. She has not reported any arthralgias or myalgias. Did not report any skin rashes or lesions. Did not report any lymphadenopathy or petechiae. She had no infusion-related complications with chemotherapy.  Remainder of her review of system is  unremarkable.  Medications: I have reviewed the patient's current medications. Unchanged by my review. Current Outpatient Prescriptions  Medication Sig Dispense Refill  . allopurinol (ZYLOPRIM) 300 MG tablet TAKE 1 TABLET (300 MG TOTAL) BY MOUTH DAILY. 30 tablet 2  . bifidobacterium infantis (ALIGN) capsule Take 1 capsule by mouth daily.    [name] BIOTIN PO Take 1 tablet by mouth daily.    . Cholecalciferol (VITAMIN D) 2000 UNITS tablet Take 2,000 Units by mouth daily.    [name] Sodium (COLACE PO) Take 3 capsules by mouth daily.    [name] HYDROcodone-acetaminophen (NORCO/VICODIN) 5-325 MG per tablet Take 1 tablet by mouth every 6 (six) hours as needed for moderate pain. 30 tablet 0  . ibuprofen (ADVIL,MOTRIN) 800 MG tablet   1  . lidocaine-prilocaine (EMLA) cream Apply 1 application topically as needed. Apply cream, on days of treatment, to [facility] site 1-2 hours prior to treatment. 30 g 0  . lisinopril-hydrochlorothiazide (PRINZIDE,ZESTORETIC) 20-12.5 MG per tablet Take 1 tablet by mouth every evening. 30 tablet 5  . ondansetron (ZOFRAN) 8 MG tablet Take 1 tablet (8 mg total) by mouth every 8 (eight) hours as needed for nausea or vomiting. 30 tablet 1  . polyethylene glycol powder (GLYCOLAX/MIRALAX) powder TAKE 1 CONTAINER TOTAL ONCE AS DIRECTED 255 g 1  . senna (SENOKOT) 8.6 MG TABS tablet Take 2 tablets (17.2 mg total) by mouth daily. 60 each 0  . SYNTHROID 88 MCG tablet TAKE 1 TABLET (88 MCG TOTAL) BY MOUTH DAILY BEFORE BREAKFAST. 30 tablet 5  . vitamin C (ASCORBIC ACID) 500 MG tablet Take 500 mg by mouth daily.     No current facility-administered medications for this visit.  Facility-Administered Medications Ordered in Other Visits  Medication Dose Route Frequency Provider Last Rate Last Dose  . heparin lock flush 100 unit/mL  500 Units Intracatheter Once PRN Wyatt Portela, MD      . sodium chloride 0.9 % injection 10 mL  10 mL Intracatheter PRN Wyatt Portela, MD         Allergies:   Allergies  Allergen Reactions  . Livalo [Pitavastatin]     Leg cramps  . Simvastatin     Leg cramps  . Statins     Leg Cramps     Past Medical History, Surgical history, Social history, and Family History were reviewed and updated.  Physical Exam: Blood pressure 107/51, pulse 75, temperature 98.6 F (37 C), temperature source Oral, resp. rate 18, height 5\' 6"  (1.676 m), weight 178 lb 8 oz (80.967 kg). ECOG: 0 General appearance: alert and cooperative not in any distress. Head: Normocephalic, without obvious abnormality Neck: no adenopathy Lymph nodes: Cervical, supraclavicular, and axillary nodes normal. Heart:regular rate and rhythm, S1, S2.  Lung:chest clear, no wheezing. Dullness to percussion. Abdomin: soft, non-tender, without masses or organomegaly no rebound or guarding. EXT:no erythema, induration, or edema Skin: No rashes or lesions.  Lab Results: Lab Results  Component Value Date   WBC 7.1 05/22/2014   HGB 11.9 05/22/2014   HCT 36.9 05/22/2014   MCV 103.6* 05/22/2014   PLT 224 05/22/2014     Chemistry      Component Value Date/Time   NA 139 05/22/2014 1355   NA 140 01/16/2014 1302   NA 139 12/02/2013 1226   K 3.7 05/22/2014 1355   K 3.9 01/16/2014 1302   CL 101 01/16/2014 1302   CO2 29 05/22/2014 1355   CO2 26 01/16/2014 1302   BUN 19.0 05/22/2014 1355   BUN 10 01/16/2014 1302   BUN 9 12/02/2013 1226   CREATININE 1.0 05/22/2014 1355   CREATININE 0.75 01/16/2014 1302   CREATININE 0.81 10/24/2012 1203      Component Value Date/Time   CALCIUM 10.0 05/22/2014 1355   CALCIUM 10.5 01/16/2014 1302   ALKPHOS 83 05/22/2014 1355   ALKPHOS 73 01/16/2014 1302   AST 25 05/22/2014 1355   AST 27 01/16/2014 1302   ALT 20 05/22/2014 1355   ALT 27 01/16/2014 1302   BILITOT 0.45 05/22/2014 1355   BILITOT 0.5 01/16/2014 1302      Impression and Plan:   64 year old woman with the following issues:  1. Multifocal hepatic metastasis of an adenocarcinoma.  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 She is on chemotherapy with gemcitabine 1000 mg day 1 and 15 of a 28 day cycle. She continues to tolerate chemotherapy without any complications. The plan is to continue with the same dose and schedule and repeat imaging studies in February 2016. She continues to have radiographic benefit along with her clinical benefit, we'll continue with the current regimen.  2. IV access: Status post Port-A-Cath placement on 09/03/2013. No complications since the last visit, no evidence of infection   3. Antiemetics: Continue Zofran as needed   4. Constipation: She uses MiraLax with excellent response.  5. Followup: She will in 2 weeks for her next cycle of chemotherapy with a CT scan and an evaluation.  Carlton Adam, PA-C  1/21/20164:57 PM

## 2014-05-22 NOTE — Patient Instructions (Signed)
[facility] Discharge Instructions for Patients Receiving Chemotherapy  Today you received the following chemotherapy agents Gemzar  To help prevent nausea and vomiting after your treatment, we encourage you to take your nausea medication as directed   If you develop nausea and vomiting that is not controlled by your nausea medication, call the clinic.   BELOW ARE SYMPTOMS THAT SHOULD BE REPORTED IMMEDIATELY:  *FEVER GREATER THAN 100.5 F  *CHILLS WITH OR WITHOUT FEVER  NAUSEA AND VOMITING THAT IS NOT CONTROLLED WITH YOUR NAUSEA MEDICATION  *UNUSUAL SHORTNESS OF BREATH  *UNUSUAL BRUISING OR BLEEDING  TENDERNESS IN MOUTH AND THROAT WITH OR WITHOUT PRESENCE OF ULCERS  *URINARY PROBLEMS  *BOWEL PROBLEMS  UNUSUAL RASH Items with * indicate a potential emergency and should be followed up as soon as possible.  Feel free to call the clinic you have any questions or concerns. The clinic phone number is [phone].    

## 2014-05-26 ENCOUNTER — Encounter: Payer: Self-pay | Admitting: Oncology

## 2014-05-26 NOTE — Progress Notes (Signed)
[employer] approved ondansetron from 02/25/14-05/26/15

## 2014-06-03 ENCOUNTER — Ambulatory Visit (HOSPITAL_COMMUNITY)
Admission: RE | Admit: 2014-06-03 | Discharge: 2014-06-03 | Disposition: A | Discharge status: Home / Self Care | Payer: Medicare Other | Source: Ambulatory Visit | Attending: Oncology | Admitting: Oncology

## 2014-06-03 ENCOUNTER — Other Ambulatory Visit (HOSPITAL_BASED_OUTPATIENT_CLINIC_OR_DEPARTMENT_OTHER): Payer: Medicare Other

## 2014-06-03 ENCOUNTER — Encounter (HOSPITAL_COMMUNITY): Payer: Self-pay

## 2014-06-03 ENCOUNTER — Other Ambulatory Visit: Payer: Medicare Other

## 2014-06-03 DIAGNOSIS — Z79899 Other long term (current) drug therapy: Secondary | ICD-10-CM | POA: Insufficient documentation

## 2014-06-03 DIAGNOSIS — C221 Intrahepatic bile duct carcinoma: Secondary | ICD-10-CM | POA: Diagnosis present

## 2014-06-03 DIAGNOSIS — C787 Secondary malignant neoplasm of liver and intrahepatic bile duct: Secondary | ICD-10-CM

## 2014-06-03 DIAGNOSIS — C801 Malignant (primary) neoplasm, unspecified: Secondary | ICD-10-CM

## 2014-06-03 LAB — CBC WITH DIFFERENTIAL/PLATELET
BASO%: 0.2 % (ref 0.0–2.0)
Basophils Absolute: 0 10*3/uL (ref 0.0–0.1)
EOS ABS: 0.1 10*3/uL (ref 0.0–0.5)
EOS%: 1.3 % (ref 0.0–7.0)
HCT: 36.9 % (ref 34.8–46.6)
HGB: 12.4 g/dL (ref 11.6–15.9)
LYMPH#: 1.4 10*3/uL (ref 0.9–3.3)
LYMPH%: 22.4 % (ref 14.0–49.7)
MCH: 34.3 pg — ABNORMAL HIGH (ref 25.1–34.0)
MCHC: 33.6 g/dL (ref 31.5–36.0)
MCV: 101.9 fL — ABNORMAL HIGH (ref 79.5–101.0)
MONO#: 0.8 10*3/uL (ref 0.1–0.9)
MONO%: 13.4 % (ref 0.0–14.0)
NEUT%: 62.7 % (ref 38.4–76.8)
NEUTROS ABS: 3.8 10*3/uL (ref 1.5–6.5)
PLATELETS: 226 10*3/uL (ref 145–400)
RBC: 3.62 10*6/uL — AB (ref 3.70–5.45)
RDW: 16.1 % — AB (ref 11.2–14.5)
WBC: 6.1 10*3/uL (ref 3.9–10.3)

## 2014-06-03 LAB — COMPREHENSIVE METABOLIC PANEL (CC13)
ALBUMIN: 4 g/dL (ref 3.5–5.0)
ALT: 24 U/L (ref 0–55)
AST: 23 U/L (ref 5–34)
Alkaline Phosphatase: 90 U/L (ref 40–150)
Anion Gap: 8 mEq/L (ref 3–11)
BILIRUBIN TOTAL: 0.48 mg/dL (ref 0.20–1.20)
BUN: 17.3 mg/dL (ref 7.0–26.0)
CALCIUM: 9.4 mg/dL (ref 8.4–10.4)
CO2: 28 mEq/L (ref 22–29)
Chloride: 104 mEq/L (ref 98–109)
Creatinine: 0.8 mg/dL (ref 0.6–1.1)
EGFR: 76 mL/min/{1.73_m2} — ABNORMAL LOW (ref >90)
Glucose: 94 mg/dl (ref 70–140)
Potassium: 3.9 mEq/L (ref 3.5–5.1)
SODIUM: 139 meq/L (ref 136–145)
Total Protein: 7.1 g/dL (ref 6.4–8.3)

## 2014-06-03 MED ORDER — IOHEXOL 300 MG/ML  SOLN
100.0000 mL | Freq: Once | INTRAMUSCULAR | Status: AC | PRN
  Administered 2014-06-03: 100 mL via INTRAVENOUS

## 2014-06-04 ENCOUNTER — Ambulatory Visit (HOSPITAL_BASED_OUTPATIENT_CLINIC_OR_DEPARTMENT_OTHER): Payer: Medicare Other | Admitting: Oncology

## 2014-06-04 ENCOUNTER — Telehealth: Payer: Self-pay | Admitting: Oncology

## 2014-06-04 VITALS — BP 129/70 | HR 87 | Temp 98.2°F | Resp 18 | Ht 66.0 in | Wt 178.5 lb

## 2014-06-04 DIAGNOSIS — K5900 Constipation, unspecified: Secondary | ICD-10-CM

## 2014-06-04 DIAGNOSIS — C221 Intrahepatic bile duct carcinoma: Secondary | ICD-10-CM

## 2014-06-04 DIAGNOSIS — C787 Secondary malignant neoplasm of liver and intrahepatic bile duct: Secondary | ICD-10-CM

## 2014-06-04 DIAGNOSIS — C801 Malignant (primary) neoplasm, unspecified: Secondary | ICD-10-CM

## 2014-06-04 NOTE — Progress Notes (Signed)
Hematology and Oncology Follow Up Visit  Lindsay Aguirre [id number] 01/07/1951 64 y.o. 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 This was confirmed by biopsy on 08/14/2013 and imaging studies showed multifocal hepatic lesions.   Prior Therapy: Status post a liver biopsy on 08/14/2013.  Current therapy: Gemcitabine at 1000 mg per meter squared started on 09/06/2013 given weekly 3 weeks on and one week off for 6 cycles.  She is currently receiving her chemotherapy every other week for better tolerance.  Interim History: Lindsay Aguirre presents for followup. Since her last visit, she reports no new complaints. She is tolerating chemotherapy with very few issues. She reports grade 1 nausea that have been very well tolerated and relieved with antiemetics.  She does not report any infusion-related complications such as fever or pruritus. She does not report any fatigue and she continues to be active.  She continues to have an excellent quality of life and able to attends activities of daily living. She is not reporting any abdominal pain or hematochezia.  She reports no fever, chills, shortness of breath chest pain, night sweats or diarrhea.  Has not reported any hematochezia or melena. Has not reported any decline in her energy level or performance status. She continues to perform her activities of daily living without any decline. She has not reported any syncope or neurological changes. She has not reported any arthralgias or myalgias. Did not report any skin rashes or lesions. Did not report any lymphadenopathy or petechiae. She had no infusion-related complications with chemotherapy.  Remainder of her review of system is unremarkable.  Medications: I have reviewed the patient's current medications. Unchanged by my review. Current Outpatient  Prescriptions  Medication Sig Dispense Refill  . allopurinol (ZYLOPRIM) 300 MG tablet TAKE 1 TABLET (300 MG TOTAL) BY MOUTH DAILY. 30 tablet 2  . bifidobacterium infantis (ALIGN) capsule Take 1 capsule by mouth daily.    [name] BIOTIN PO Take 1 tablet by mouth daily.    . Cholecalciferol (VITAMIN D) 2000 UNITS tablet Take 2,000 Units by mouth daily.    [name] Sodium (COLACE PO) Take 3 capsules by mouth daily.    [name] HYDROcodone-acetaminophen (NORCO/VICODIN) 5-325 MG per tablet Take 1 tablet by mouth every 6 (six) hours as needed for moderate pain. 30 tablet 0  . ibuprofen (ADVIL,MOTRIN) 800 MG tablet   1  . lidocaine-prilocaine (EMLA) cream Apply 1 application topically as needed. Apply cream, on days of treatment, to [facility] site 1-2 hours prior to treatment. 30 g 0  . lisinopril-hydrochlorothiazide (PRINZIDE,ZESTORETIC) 20-12.5 MG per tablet Take 1 tablet by mouth every evening. 30 tablet 5  . ondansetron (ZOFRAN) 8 MG tablet Take 1 tablet (8 mg total) by mouth every 8 (eight) hours as needed for nausea or vomiting. 30 tablet 1  . polyethylene glycol powder (GLYCOLAX/MIRALAX) powder TAKE 1 CONTAINER TOTAL ONCE AS DIRECTED 255 g 1  . senna (SENOKOT) 8.6 MG TABS tablet Take 2 tablets (17.2 mg total) by mouth daily. 60 each 0  . SYNTHROID 88 MCG tablet TAKE 1 TABLET (88 MCG TOTAL) BY MOUTH DAILY BEFORE BREAKFAST. 30 tablet 5  . vitamin C (ASCORBIC ACID) 500 MG tablet Take 500 mg by mouth daily.     No current facility-administered medications for this visit.     Allergies:  Allergies  Allergen Reactions  . Livalo [Pitavastatin]     Leg  cramps  . Simvastatin     Leg cramps  . Statins     Leg Cramps     Past Medical History, Surgical history, Social history, and Family History were reviewed and updated.  Physical Exam: Blood pressure 129/70, pulse 87, temperature 98.2 F (36.8 C), temperature source Oral, resp. rate 18, height 5\' 6"  (1.676 m), weight 178 lb 8 oz (80.967 kg), SpO2 100  %. ECOG: 0 General appearance: alert and cooperative not in any distress. Head: Normocephalic, without obvious abnormality Neck: no adenopathy Lymph nodes: Cervical, supraclavicular, and axillary nodes normal. Heart:regular rate and rhythm, S1, S2.  Lung:chest clear, no wheezing. Dullness to percussion. Abdomin: soft, non-tender, without masses or organomegaly no rebound or guarding. EXT:no erythema, induration, or edema Skin: No rashes or lesions.  Lab Results: Lab Results  Component Value Date   WBC 6.1 06/03/2014   HGB 12.4 06/03/2014   HCT 36.9 06/03/2014   MCV 101.9* 06/03/2014   PLT 226 06/03/2014     Chemistry      Component Value Date/Time   NA 139 06/03/2014 0917   NA 140 01/16/2014 1302   NA 139 12/02/2013 1226   K 3.9 06/03/2014 0917   K 3.9 01/16/2014 1302   CL 101 01/16/2014 1302   CO2 28 06/03/2014 0917   CO2 26 01/16/2014 1302   BUN 17.3 06/03/2014 0917   BUN 10 01/16/2014 1302   BUN 9 12/02/2013 1226   CREATININE 0.8 06/03/2014 0917   CREATININE 0.75 01/16/2014 1302   CREATININE 0.81 10/24/2012 1203      Component Value Date/Time   CALCIUM 9.4 06/03/2014 0917   CALCIUM 10.5 01/16/2014 1302   ALKPHOS 90 06/03/2014 0917   ALKPHOS 73 01/16/2014 1302   AST 23 06/03/2014 0917   AST 27 01/16/2014 1302   ALT 24 06/03/2014 0917   ALT 27 01/16/2014 1302   BILITOT 0.48 06/03/2014 0917   BILITOT 0.5 01/16/2014 1302      EXAM: CT ABDOMEN AND PELVIS WITH CONTRAST  TECHNIQUE: Multidetector CT imaging of the abdomen and pelvis was performed using the standard protocol following bolus administration of intravenous contrast.  CONTRAST: 143mL OMNIPAQUE IOHEXOL 300 MG/ML SOLN  COMPARISON: Prior CT 10/29/2013 and 12/31/2013.  FINDINGS: Lower chest: 3 mm right middle lobe nodule on image number 1 and 5 mm left lower lobe nodule on image number 7 are stable. The lung bases are otherwise clear. There is no pleural or  pericardial effusion.  Hepatobiliary: Multifocal hypervascular metastases are again demonstrated. Although the current examination includes arterial phase images which make the lesions more conspicuous, the disease does appear progressive. 2.4 cm lesion in the dome of the left hepatic lobe on image 28 previously measured 2.1 cm. There is a large relatively hypovascular lesion involving the medial and lateral segments of the left hepatic lobe. The component in the medial segment measures 9.3 x 5.7 cm on image 56 (previously 8.3 x 5.2 cm). The component in the lateral segment measures 4.4 x 3.6 cm. More superiorly in the lateral segment, there is a complex lesion with peripheral hypervascular components which has enlarged. There is a possible new small lesion in the dome of the right hepatic lobe on image 29. No gallbladder mass, wall thickening or biliary dilatation identified.  Pancreas: Stable appearance. No evidence of pancreatic mass or pancreatic ductal dilatation.  Spleen: Normal in size without focal abnormality.  Adrenals/Urinary Tract: Both adrenal glands appear normal.The kidneys appear normal without evidence of urinary tract calculus or  hydronephrosis. No bladder abnormalities are seen.  Stomach/Bowel: No evidence of bowel wall thickening, distention or surrounding inflammatory change. The appendix appears normal.No primary bowel malignancy identified.  Vascular/Lymphatic: There are stable prominent lymph nodes within the porta hepatis. There are no other enlarged abdominal or pelvic lymph nodes. No peritoneal nodularity or ascites identified. Stable mild aortoiliac atherosclerosis. Perisplenic collateral vessels are stable. The portal and splenic veins remain patent.  Reproductive: The uterus and ovaries appear stable. There is stable low-density enlargement of the left ovary which measures 3.4 cm on image 68. There are ovarian vein collateral vessels, larger  on the left.  Other: No evidence of abdominal wall mass or hernia.  Musculoskeletal: No acute or significant osseous findings. No evidence of osseous metastatic disease. Previous lumbar fusion noted.  IMPRESSION: 1. Mild progression in diffuse multifocal hypervascular hepatic metastatic disease. 2. The gallbladder and biliary system appear normal. No evidence of pancreatic malignancy. 3. No GI primary malignancy or definite extrahepatic involvement. 4. Stable left ovarian cystic lesion and perisplenic and ovarian vein collateral vessels.  Impression and Plan:   64 year old woman with the following issues:  1. 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 She is on chemotherapy with gemcitabine 1000 mg day 1 and 15 of a 28 day cycle. She continues to tolerate chemotherapy without any complications. CT scan from 06/03/2014 was discussed with the patient and showed for the most part stable disease. She did have mild progression of her liver disease but no lesions have been identified. The risks and benefits of changing her regimen versus keeping her on the current regimen were discussed. The time being she is achieving excellent quality of life and a balance between treatment and stable disease and would like to keep that. For the time being, we'll keep her on the same dose and regimen. Plan to repeat imaging studies in 3-4 months.  2. IV access: Status post Port-A-Cath placement on 09/03/2013. No complications since the last visit, no evidence of infection   3. Antiemetics: Continue Zofran as needed   4. Constipation: She uses MiraLax with excellent response.  5. Followup: She will receive the next cycle of chemotherapy on 06/05/2014 and every other week after that.  Zola Button, MD 2/3/20163:30 PM

## 2014-06-04 NOTE — Telephone Encounter (Signed)
gv adn printed appt sched and avs for pt for Feb thru April 2016...sed added tx.

## 2014-06-05 ENCOUNTER — Ambulatory Visit (HOSPITAL_BASED_OUTPATIENT_CLINIC_OR_DEPARTMENT_OTHER): Payer: Medicare Other

## 2014-06-05 DIAGNOSIS — C787 Secondary malignant neoplasm of liver and intrahepatic bile duct: Secondary | ICD-10-CM

## 2014-06-05 DIAGNOSIS — C801 Malignant (primary) neoplasm, unspecified: Secondary | ICD-10-CM

## 2014-06-05 DIAGNOSIS — C221 Intrahepatic bile duct carcinoma: Secondary | ICD-10-CM

## 2014-06-05 DIAGNOSIS — Z5111 Encounter for antineoplastic chemotherapy: Secondary | ICD-10-CM

## 2014-06-05 MED ORDER — SODIUM CHLORIDE 0.9 % IJ SOLN
10.0000 mL | INTRAMUSCULAR | Status: DC | PRN
  Administered 2014-06-05: 10 mL
  Filled 2014-06-05: qty 10

## 2014-06-05 MED ORDER — PROCHLORPERAZINE MALEATE 10 MG PO TABS
10.0000 mg | ORAL_TABLET | Freq: Once | ORAL | Status: AC
  Administered 2014-06-05: 10 mg via ORAL

## 2014-06-05 MED ORDER — SODIUM CHLORIDE 0.9 % IV SOLN
Freq: Once | INTRAVENOUS | Status: AC
  Administered 2014-06-05: 13:00:00 via INTRAVENOUS

## 2014-06-05 MED ORDER — HEPARIN SOD (PORK) LOCK FLUSH 100 UNIT/ML IV SOLN
500.0000 [IU] | Freq: Once | INTRAVENOUS | Status: AC | PRN
  Administered 2014-06-05: 500 [IU]
  Filled 2014-06-05: qty 5

## 2014-06-05 MED ORDER — SODIUM CHLORIDE 0.9 % IV SOLN
1000.0000 mg/m2 | Freq: Once | INTRAVENOUS | Status: AC
  Administered 2014-06-05: 1938 mg via INTRAVENOUS
  Filled 2014-06-05: qty 51

## 2014-06-05 MED ORDER — PROCHLORPERAZINE MALEATE 10 MG PO TABS
ORAL_TABLET | ORAL | Status: AC
  Filled 2014-06-05: qty 1

## 2014-06-05 NOTE — Patient Instructions (Signed)
[facility] Discharge Instructions for Patients Receiving Chemotherapy  Today you received the following chemotherapy agents:  Gemzar  To help prevent nausea and vomiting after your treatment, we encourage you to take your nausea medication as ordered per MD.   If you develop nausea and vomiting that is not controlled by your nausea medication, call the clinic.   BELOW ARE SYMPTOMS THAT SHOULD BE REPORTED IMMEDIATELY:  *FEVER GREATER THAN 100.5 F  *CHILLS WITH OR WITHOUT FEVER  NAUSEA AND VOMITING THAT IS NOT CONTROLLED WITH YOUR NAUSEA MEDICATION  *UNUSUAL SHORTNESS OF BREATH  *UNUSUAL BRUISING OR BLEEDING  TENDERNESS IN MOUTH AND THROAT WITH OR WITHOUT PRESENCE OF ULCERS  *URINARY PROBLEMS  *BOWEL PROBLEMS  UNUSUAL RASH Items with * indicate a potential emergency and should be followed up as soon as possible.  Feel free to call the clinic you have any questions or concerns. The clinic phone number is [phone].    

## 2014-06-09 ENCOUNTER — Ambulatory Visit: Payer: 59 | Admitting: Nurse Practitioner

## 2014-06-19 ENCOUNTER — Other Ambulatory Visit (HOSPITAL_BASED_OUTPATIENT_CLINIC_OR_DEPARTMENT_OTHER): Payer: Medicare Other

## 2014-06-19 ENCOUNTER — Ambulatory Visit (HOSPITAL_BASED_OUTPATIENT_CLINIC_OR_DEPARTMENT_OTHER): Payer: Medicare Other

## 2014-06-19 DIAGNOSIS — C801 Malignant (primary) neoplasm, unspecified: Secondary | ICD-10-CM

## 2014-06-19 DIAGNOSIS — C787 Secondary malignant neoplasm of liver and intrahepatic bile duct: Secondary | ICD-10-CM

## 2014-06-19 DIAGNOSIS — Z5111 Encounter for antineoplastic chemotherapy: Secondary | ICD-10-CM

## 2014-06-19 DIAGNOSIS — C221 Intrahepatic bile duct carcinoma: Secondary | ICD-10-CM

## 2014-06-19 LAB — COMPREHENSIVE METABOLIC PANEL (CC13)
ALBUMIN: 4 g/dL (ref 3.5–5.0)
ALK PHOS: 83 U/L (ref 40–150)
ALT: 22 U/L (ref 0–55)
AST: 23 U/L (ref 5–34)
Anion Gap: 10 mEq/L (ref 3–11)
BILIRUBIN TOTAL: 0.38 mg/dL (ref 0.20–1.20)
BUN: 13.1 mg/dL (ref 7.0–26.0)
CO2: 26 mEq/L (ref 22–29)
Calcium: 10.2 mg/dL (ref 8.4–10.4)
Chloride: 105 mEq/L (ref 98–109)
Creatinine: 0.8 mg/dL (ref 0.6–1.1)
EGFR: 74 mL/min/{1.73_m2} — ABNORMAL LOW (ref >90)
Glucose: 122 mg/dl (ref 70–140)
POTASSIUM: 3.5 meq/L (ref 3.5–5.1)
SODIUM: 140 meq/L (ref 136–145)
TOTAL PROTEIN: 7.1 g/dL (ref 6.4–8.3)

## 2014-06-19 LAB — CBC WITH DIFFERENTIAL/PLATELET
BASO%: 0.7 % (ref 0.0–2.0)
BASOS ABS: 0.1 10*3/uL (ref 0.0–0.1)
EOS%: 1 % (ref 0.0–7.0)
Eosinophils Absolute: 0.1 10*3/uL (ref 0.0–0.5)
HEMATOCRIT: 37.4 % (ref 34.8–46.6)
HEMOGLOBIN: 12.1 g/dL (ref 11.6–15.9)
LYMPH%: 17.2 % (ref 14.0–49.7)
MCH: 33.2 pg (ref 25.1–34.0)
MCHC: 32.5 g/dL (ref 31.5–36.0)
MCV: 102.1 fL — ABNORMAL HIGH (ref 79.5–101.0)
MONO#: 0.8 10*3/uL (ref 0.1–0.9)
MONO%: 9.6 % (ref 0.0–14.0)
NEUT%: 71.5 % (ref 38.4–76.8)
NEUTROS ABS: 6.1 10*3/uL (ref 1.5–6.5)
Platelets: 255 10*3/uL (ref 145–400)
RBC: 3.66 10*6/uL — ABNORMAL LOW (ref 3.70–5.45)
RDW: 16.9 % — AB (ref 11.2–14.5)
WBC: 8.5 10*3/uL (ref 3.9–10.3)
lymph#: 1.5 10*3/uL (ref 0.9–3.3)

## 2014-06-19 MED ORDER — HEPARIN SOD (PORK) LOCK FLUSH 100 UNIT/ML IV SOLN
500.0000 [IU] | Freq: Once | INTRAVENOUS | Status: AC | PRN
  Administered 2014-06-19: 500 [IU]
  Filled 2014-06-19: qty 5

## 2014-06-19 MED ORDER — SODIUM CHLORIDE 0.9 % IJ SOLN
10.0000 mL | INTRAMUSCULAR | Status: DC | PRN
  Administered 2014-06-19: 10 mL
  Filled 2014-06-19: qty 10

## 2014-06-19 MED ORDER — SODIUM CHLORIDE 0.9 % IV SOLN
1000.0000 mg/m2 | Freq: Once | INTRAVENOUS | Status: AC
  Administered 2014-06-19: 1938 mg via INTRAVENOUS
  Filled 2014-06-19: qty 50.97

## 2014-06-19 MED ORDER — PROCHLORPERAZINE MALEATE 10 MG PO TABS
10.0000 mg | ORAL_TABLET | Freq: Once | ORAL | Status: AC
Start: 2014-06-19 — End: 2014-06-19
  Administered 2014-06-19: 10 mg via ORAL

## 2014-06-19 MED ORDER — SODIUM CHLORIDE 0.9 % IV SOLN: Freq: Once | INTRAVENOUS | Status: DC

## 2014-06-19 MED ORDER — PROCHLORPERAZINE MALEATE 10 MG PO TABS
ORAL_TABLET | ORAL | Status: AC
  Filled 2014-06-19: qty 1

## 2014-06-19 NOTE — Patient Instructions (Signed)
[facility] Discharge Instructions for Patients Receiving Chemotherapy  Today you received the following chemotherapy agents Gemzar, To help prevent nausea and vomiting after your treatment, we encourage you to take your nausea medication as directed/prescribed   If you develop nausea and vomiting that is not controlled by your nausea medication, call the clinic.   BELOW ARE SYMPTOMS THAT SHOULD BE REPORTED IMMEDIATELY:  *FEVER GREATER THAN 100.5 F  *CHILLS WITH OR WITHOUT FEVER  NAUSEA AND VOMITING THAT IS NOT CONTROLLED WITH YOUR NAUSEA MEDICATION  *UNUSUAL SHORTNESS OF BREATH  *UNUSUAL BRUISING OR BLEEDING  TENDERNESS IN MOUTH AND THROAT WITH OR WITHOUT PRESENCE OF ULCERS  *URINARY PROBLEMS  *BOWEL PROBLEMS  UNUSUAL RASH Items with * indicate a potential emergency and should be followed up as soon as possible.  Feel free to call the clinic you have any questions or concerns. The clinic phone number is [phone].

## 2014-07-03 ENCOUNTER — Ambulatory Visit (HOSPITAL_BASED_OUTPATIENT_CLINIC_OR_DEPARTMENT_OTHER): Payer: Medicare Other | Admitting: Physician Assistant

## 2014-07-03 ENCOUNTER — Ambulatory Visit (HOSPITAL_BASED_OUTPATIENT_CLINIC_OR_DEPARTMENT_OTHER): Payer: Medicare Other

## 2014-07-03 ENCOUNTER — Encounter: Payer: Self-pay | Admitting: Physician Assistant

## 2014-07-03 ENCOUNTER — Other Ambulatory Visit (HOSPITAL_BASED_OUTPATIENT_CLINIC_OR_DEPARTMENT_OTHER): Payer: Medicare Other

## 2014-07-03 VITALS — BP 135/65 | HR 79 | Temp 98.0°F | Resp 18 | Ht 66.0 in | Wt 179.3 lb

## 2014-07-03 DIAGNOSIS — C801 Malignant (primary) neoplasm, unspecified: Secondary | ICD-10-CM

## 2014-07-03 DIAGNOSIS — C787 Secondary malignant neoplasm of liver and intrahepatic bile duct: Secondary | ICD-10-CM

## 2014-07-03 DIAGNOSIS — C221 Intrahepatic bile duct carcinoma: Secondary | ICD-10-CM

## 2014-07-03 DIAGNOSIS — Z5111 Encounter for antineoplastic chemotherapy: Secondary | ICD-10-CM | POA: Diagnosis not present

## 2014-07-03 LAB — CBC WITH DIFFERENTIAL/PLATELET
BASO%: 0.3 % (ref 0.0–2.0)
BASOS ABS: 0 10*3/uL (ref 0.0–0.1)
EOS%: 1.9 % (ref 0.0–7.0)
Eosinophils Absolute: 0.1 10*3/uL (ref 0.0–0.5)
HCT: 35.4 % (ref 34.8–46.6)
HEMOGLOBIN: 11.7 g/dL (ref 11.6–15.9)
LYMPH%: 18.6 % (ref 14.0–49.7)
MCH: 34.1 pg — AB (ref 25.1–34.0)
MCHC: 33.1 g/dL (ref 31.5–36.0)
MCV: 103.2 fL — AB (ref 79.5–101.0)
MONO#: 0.6 10*3/uL (ref 0.1–0.9)
MONO%: 7.6 % (ref 0.0–14.0)
NEUT#: 5.1 10*3/uL (ref 1.5–6.5)
NEUT%: 71.6 % (ref 38.4–76.8)
PLATELETS: 213 10*3/uL (ref 145–400)
RBC: 3.43 10*6/uL — ABNORMAL LOW (ref 3.70–5.45)
RDW: 17.3 % — ABNORMAL HIGH (ref 11.2–14.5)
WBC: 7.2 10*3/uL (ref 3.9–10.3)
lymph#: 1.3 10*3/uL (ref 0.9–3.3)

## 2014-07-03 LAB — COMPREHENSIVE METABOLIC PANEL (CC13)
ALBUMIN: 3.9 g/dL (ref 3.5–5.0)
ALT: 19 U/L (ref 0–55)
ANION GAP: 10 meq/L (ref 3–11)
AST: 24 U/L (ref 5–34)
Alkaline Phosphatase: 85 U/L (ref 40–150)
BUN: 11.2 mg/dL (ref 7.0–26.0)
CO2: 28 mEq/L (ref 22–29)
Calcium: 10.1 mg/dL (ref 8.4–10.4)
Chloride: 104 mEq/L (ref 98–109)
Creatinine: 0.8 mg/dL (ref 0.6–1.1)
EGFR: 74 mL/min/{1.73_m2} — ABNORMAL LOW (ref >90)
GLUCOSE: 143 mg/dL — AB (ref 70–140)
POTASSIUM: 3.5 meq/L (ref 3.5–5.1)
SODIUM: 141 meq/L (ref 136–145)
TOTAL PROTEIN: 6.9 g/dL (ref 6.4–8.3)
Total Bilirubin: 0.41 mg/dL (ref 0.20–1.20)

## 2014-07-03 MED ORDER — PROCHLORPERAZINE MALEATE 10 MG PO TABS
10.0000 mg | ORAL_TABLET | Freq: Once | ORAL | Status: AC
  Administered 2014-07-03: 10 mg via ORAL

## 2014-07-03 MED ORDER — HEPARIN SOD (PORK) LOCK FLUSH 100 UNIT/ML IV SOLN
500.0000 [IU] | Freq: Once | INTRAVENOUS | Status: AC | PRN
  Administered 2014-07-03: 500 [IU]
  Filled 2014-07-03: qty 5

## 2014-07-03 MED ORDER — PROCHLORPERAZINE MALEATE 10 MG PO TABS: 10.0000 mg | ORAL_TABLET | Freq: Four times a day (QID) | ORAL | Status: DC | PRN

## 2014-07-03 MED ORDER — SODIUM CHLORIDE 0.9 % IJ SOLN
10.0000 mL | INTRAMUSCULAR | Status: DC | PRN
  Administered 2014-07-03: 10 mL
  Filled 2014-07-03: qty 10

## 2014-07-03 MED ORDER — PROCHLORPERAZINE MALEATE 10 MG PO TABS
ORAL_TABLET | ORAL | Status: AC
  Filled 2014-07-03: qty 1

## 2014-07-03 MED ORDER — SODIUM CHLORIDE 0.9 % IV SOLN
1000.0000 mg/m2 | Freq: Once | INTRAVENOUS | Status: AC
  Administered 2014-07-03: 1938 mg via INTRAVENOUS
  Filled 2014-07-03: qty 50.97

## 2014-07-03 MED ORDER — SODIUM CHLORIDE 0.9 % IV SOLN
Freq: Once | INTRAVENOUS | Status: AC
  Administered 2014-07-03: 15:00:00 via INTRAVENOUS

## 2014-07-03 NOTE — Patient Instructions (Signed)
[facility] Discharge Instructions for Patients Receiving Chemotherapy  Today you received the following chemotherapy agents Gemzar.  To help prevent nausea and vomiting after your treatment, we encourage you to take your nausea medication as prescribed.   If you develop nausea and vomiting that is not controlled by your nausea medication, call the clinic.   BELOW ARE SYMPTOMS THAT SHOULD BE REPORTED IMMEDIATELY:  *FEVER GREATER THAN 100.5 F  *CHILLS WITH OR WITHOUT FEVER  NAUSEA AND VOMITING THAT IS NOT CONTROLLED WITH YOUR NAUSEA MEDICATION  *UNUSUAL SHORTNESS OF BREATH  *UNUSUAL BRUISING OR BLEEDING  TENDERNESS IN MOUTH AND THROAT WITH OR WITHOUT PRESENCE OF ULCERS  *URINARY PROBLEMS  *BOWEL PROBLEMS  UNUSUAL RASH Items with * indicate a potential emergency and should be followed up as soon as possible.  Feel free to call the clinic you have any questions or concerns. The clinic phone number is [phone].    

## 2014-07-03 NOTE — Progress Notes (Signed)
Hematology and Oncology Follow Up Visit  Lindsay Aguirre [id number] 07-Dec-1950 64 y.o. 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 This was confirmed by biopsy on 08/14/2013 and imaging studies showed multifocal hepatic lesions.   Prior Therapy: Status post a liver biopsy on 08/14/2013.  Current therapy: Gemcitabine at 1000 mg per meter squared started on 09/06/2013 given weekly 3 weeks on and one week off for 6 cycles.  She is currently receiving her chemotherapy every other week for better tolerance.  Interim History: Lindsay Aguirre presents for followup. Since her last visit, she reports no new complaints except for increased nausea uncontrolled with just Zofran. She is tolerating chemotherapy with very few issues.   She does not report any infusion-related complications such as fever or pruritus. She does not report any fatigue and she continues to be active.  She continues to have an excellent quality of life and able to attends activities of daily living. She is not reporting any abdominal pain or hematochezia.  She reports no fever, chills, shortness of breath chest pain, night sweats or diarrhea.  Has not reported any hematochezia or melena. Has not reported any decline in her energy level or performance status. She continues to perform her activities of daily living without any decline. She has not reported any syncope or neurological changes. She has not reported any arthralgias or myalgias. Did not report any skin rashes or lesions. Did not report any lymphadenopathy or petechiae. She had no infusion-related complications with chemotherapy.  Remainder of her review of system is unremarkable.  Medications: I have reviewed the patient's current medications. Unchanged by my review. Current Outpatient Prescriptions  Medication Sig  Dispense Refill  . allopurinol (ZYLOPRIM) 300 MG tablet TAKE 1 TABLET (300 MG TOTAL) BY MOUTH DAILY. 30 tablet 2  . bifidobacterium infantis (ALIGN) capsule Take 1 capsule by mouth daily.    [name] BIOTIN PO Take 1 tablet by mouth daily.    . Cholecalciferol (VITAMIN D) 2000 UNITS tablet Take 2,000 Units by mouth daily.    [name] Sodium (COLACE PO) Take 3 capsules by mouth daily.    [name] HYDROcodone-acetaminophen (NORCO/VICODIN) 5-325 MG per tablet Take 1 tablet by mouth every 6 (six) hours as needed for moderate pain. 30 tablet 0  . ibuprofen (ADVIL,MOTRIN) 800 MG tablet   1  . lidocaine-prilocaine (EMLA) cream Apply 1 application topically as needed. Apply cream, on days of treatment, to [facility] site 1-2 hours prior to treatment. 30 g 0  . lisinopril-hydrochlorothiazide (PRINZIDE,ZESTORETIC) 20-12.5 MG per tablet Take 1 tablet by mouth every evening. 30 tablet 5  . ondansetron (ZOFRAN) 8 MG tablet Take 1 tablet (8 mg total) by mouth every 8 (eight) hours as needed for nausea or vomiting. 30 tablet 1  . polyethylene glycol powder (GLYCOLAX/MIRALAX) powder TAKE 1 CONTAINER TOTAL ONCE AS DIRECTED 255 g 1  . senna (SENOKOT) 8.6 MG TABS tablet Take 2 tablets (17.2 mg total) by mouth daily. 60 each 0  . SYNTHROID 88 MCG tablet TAKE 1 TABLET (88 MCG TOTAL) BY MOUTH DAILY BEFORE BREAKFAST. 30 tablet 5  . vitamin C (ASCORBIC ACID) 500 MG tablet Take 500 mg by mouth daily.    . prochlorperazine (COMPAZINE) 10 MG tablet Take 1 tablet (10 mg total) by mouth every 6 (six) hours as needed for nausea or vomiting. 30 tablet 1   No current facility-administered medications  for this visit.   Facility-Administered Medications Ordered in Other Visits  Medication Dose Route Frequency Provider Last Rate Last Dose  . Gemcitabine HCl (GEMZAR) 1,938 mg in sodium chloride 0.9 % 100 mL chemo infusion  1,000 mg/m2 (Treatment Plan Actual) Intravenous Once Wyatt Portela, MD      . heparin lock flush 100 unit/mL  500 Units  Intracatheter Once PRN Wyatt Portela, MD      . sodium chloride 0.9 % injection 10 mL  10 mL Intracatheter PRN Wyatt Portela, MD         Allergies:  Allergies  Allergen Reactions  . Livalo [Pitavastatin]     Leg cramps  . Simvastatin     Leg cramps  . Statins     Leg Cramps     Past Medical History, Surgical history, Social history, and Family History were reviewed and updated.  Physical Exam: Blood pressure 135/65, pulse 79, temperature 98 F (36.7 C), temperature source Oral, resp. rate 18, height 5\' 6"  (1.676 m), weight 179 lb 4.8 oz (81.33 kg), SpO2 100 %. ECOG: 0 General appearance: alert and cooperative not in any distress. Head: Normocephalic, without obvious abnormality Neck: no adenopathy Lymph nodes: Cervical, supraclavicular, and axillary nodes normal. Heart:regular rate and rhythm, S1, S2.  Lung:chest clear, no wheezing. Dullness to percussion. Abdomin: soft, non-tender, without masses or organomegaly no rebound or guarding. EXT:no erythema, induration, or edema Skin: No rashes or lesions.  Lab Results: Lab Results  Component Value Date   WBC 7.2 07/03/2014   HGB 11.7 07/03/2014   HCT 35.4 07/03/2014   MCV 103.2* 07/03/2014   PLT 213 07/03/2014     Chemistry      Component Value Date/Time   NA 141 07/03/2014 1419   NA 140 01/16/2014 1302   NA 139 12/02/2013 1226   K 3.5 07/03/2014 1419   K 3.9 01/16/2014 1302   CL 101 01/16/2014 1302   CO2 28 07/03/2014 1419   CO2 26 01/16/2014 1302   BUN 11.2 07/03/2014 1419   BUN 10 01/16/2014 1302   BUN 9 12/02/2013 1226   CREATININE 0.8 07/03/2014 1419   CREATININE 0.75 01/16/2014 1302   CREATININE 0.81 10/24/2012 1203      Component Value Date/Time   CALCIUM 10.1 07/03/2014 1419   CALCIUM 10.5 01/16/2014 1302   ALKPHOS 85 07/03/2014 1419   ALKPHOS 73 01/16/2014 1302   AST 24 07/03/2014 1419   AST 27 01/16/2014 1302   ALT 19 07/03/2014 1419   ALT 27 01/16/2014 1302   BILITOT 0.41 07/03/2014 1419    BILITOT 0.5 01/16/2014 1302      EXAM: CT ABDOMEN AND PELVIS WITH CONTRAST  TECHNIQUE: Multidetector CT imaging of the abdomen and pelvis was performed using the standard protocol following bolus administration of intravenous contrast.  CONTRAST: 162mL OMNIPAQUE IOHEXOL 300 MG/ML SOLN  COMPARISON: Prior CT 10/29/2013 and 12/31/2013.  FINDINGS: Lower chest: 3 mm right middle lobe nodule on image number 1 and 5 mm left lower lobe nodule on image number 7 are stable. The lung bases are otherwise clear. There is no pleural or pericardial effusion.  Hepatobiliary: Multifocal hypervascular metastases are again demonstrated. Although the current examination includes arterial phase images which make the lesions more conspicuous, the disease does appear progressive. 2.4 cm lesion in the dome of the left hepatic lobe on image 28 previously measured 2.1 cm. There is a large relatively hypovascular lesion involving the medial and lateral segments of the left hepatic  lobe. The component in the medial segment measures 9.3 x 5.7 cm on image 56 (previously 8.3 x 5.2 cm). The component in the lateral segment measures 4.4 x 3.6 cm. More superiorly in the lateral segment, there is a complex lesion with peripheral hypervascular components which has enlarged. There is a possible new small lesion in the dome of the right hepatic lobe on image 29. No gallbladder mass, wall thickening or biliary dilatation identified.  Pancreas: Stable appearance. No evidence of pancreatic mass or pancreatic ductal dilatation.  Spleen: Normal in size without focal abnormality.  Adrenals/Urinary Tract: Both adrenal glands appear normal.The kidneys appear normal without evidence of urinary tract calculus or hydronephrosis. No bladder abnormalities are seen.  Stomach/Bowel: No evidence of bowel wall thickening, distention or surrounding inflammatory change. The appendix appears normal.No primary  bowel malignancy identified.  Vascular/Lymphatic: There are stable prominent lymph nodes within the porta hepatis. There are no other enlarged abdominal or pelvic lymph nodes. No peritoneal nodularity or ascites identified. Stable mild aortoiliac atherosclerosis. Perisplenic collateral vessels are stable. The portal and splenic veins remain patent.  Reproductive: The uterus and ovaries appear stable. There is stable low-density enlargement of the left ovary which measures 3.4 cm on image 68. There are ovarian vein collateral vessels, larger on the left.  Other: No evidence of abdominal wall mass or hernia.  Musculoskeletal: No acute or significant osseous findings. No evidence of osseous metastatic disease. Previous lumbar fusion noted.  IMPRESSION: 1. Mild progression in diffuse multifocal hypervascular hepatic metastatic disease. 2. The gallbladder and biliary system appear normal. No evidence of pancreatic malignancy. 3. No GI primary malignancy or definite extrahepatic involvement. 4. Stable left ovarian cystic lesion and perisplenic and ovarian vein collateral vessels.  Impression and Plan:   64 year old woman with the following issues:  1. 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 She is on chemotherapy with gemcitabine 1000 mg day 1 and 15 of a 28 day cycle. She continues to tolerate chemotherapy without any complications. CT scan from 06/03/2014 was discussed with the patient and showed for the most part stable disease. She did have mild progression of her liver disease but no lesions have been identified. The risks and benefits of changing her regimen versus keeping her on the current regimen were discussed. The time being she is achieving excellent quality of life and a balance between treatment and stable disease and would like to keep that. For the time being, we'll keep her on the same dose and regimen.  Plan to repeat imaging studies in 3 months.  2. IV access: Status post Port-A-Cath placement on 09/03/2013. No complications since the last visit, no evidence of infection   3. Antiemetics: Continue Zofran as needed. I will also add a per prescription for Compazine and she may alternate the 2 medications as needed to control her nausea.  4. Constipation: She uses MiraLax with excellent response.  5. Followup: She will receive the next cycle of chemotherapy on 07/17/2014 and every other week after that.  Awilda Metro E, PA-C  3/3/20163:50 PM

## 2014-07-06 NOTE — Patient Instructions (Signed)
Alternate the Zofran and Compazine as prescribed as needed for your nausea Follow up in 2 weeks

## 2014-07-10 ENCOUNTER — Ambulatory Visit (INDEPENDENT_AMBULATORY_CARE_PROVIDER_SITE_OTHER): Payer: Medicare Other | Admitting: Nurse Practitioner

## 2014-07-10 ENCOUNTER — Encounter: Payer: Self-pay | Admitting: Nurse Practitioner

## 2014-07-10 VITALS — BP 112/70 | HR 85 | Temp 97.0°F | Ht 66.0 in | Wt 175.0 lb

## 2014-07-10 DIAGNOSIS — C221 Intrahepatic bile duct carcinoma: Secondary | ICD-10-CM

## 2014-07-10 DIAGNOSIS — E785 Hyperlipidemia, unspecified: Secondary | ICD-10-CM

## 2014-07-10 DIAGNOSIS — E039 Hypothyroidism, unspecified: Secondary | ICD-10-CM

## 2014-07-10 DIAGNOSIS — I10 Essential (primary) hypertension: Secondary | ICD-10-CM | POA: Diagnosis not present

## 2014-07-10 MED ORDER — SYNTHROID 88 MCG PO TABS: ORAL_TABLET | ORAL | Status: DC

## 2014-07-10 MED ORDER — LISINOPRIL-HYDROCHLOROTHIAZIDE 20-12.5 MG PO TABS: 1.0000 | ORAL_TABLET | Freq: Every evening | ORAL | Status: DC

## 2014-07-10 NOTE — Progress Notes (Addendum)
  Subjective:    Patient ID: Lindsay Aguirre, female    DOB: 18-Jan-1951, 64 y.o.   MRN: [id number]  Patient here today for follow up of chronic medical problems. 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   Hyperlipidemia This is a chronic problem. The current episode started more than 1 year ago. The problem is controlled. Recent lipid tests were reviewed and are normal. Pertinent negatives include no chest pain, myalgias or shortness of breath. She is currently on no antihyperlipidemic treatment. The current treatment provides mild improvement of lipids. There are no compliance problems.  Risk factors for coronary artery disease include dyslipidemia, hypertension and post-menopausal.  Hypertension This is a chronic problem. The current episode started more than 1 year ago. The problem is controlled. Pertinent negatives include no chest pain, headaches, palpitations or shortness of breath. Risk factors for coronary artery disease include post-menopausal state and dyslipidemia. Past treatments include diuretics. The current treatment provides mild improvement. There are no compliance problems.  Hypertensive end-organ damage includes a thyroid problem.  Thyroid Problem Presents for follow-up visit. Patient reports no cold intolerance, constipation, fatigue, menstrual problem, palpitations or visual change. Her past medical history is significant for hyperlipidemia.  GOUT Allopurinol- daily- Hasn't had a flare up in .6 years.    Review of Systems  Constitutional: Negative for fatigue.  Respiratory: Negative for shortness of breath.   Cardiovascular: Negative for chest pain and palpitations.  Gastrointestinal: Negative for constipation.  Endocrine: Negative for cold intolerance.  Genitourinary: Negative for menstrual problem.  Musculoskeletal: Negative for myalgias.  Neurological: Negative for headaches.  All other systems reviewed and are  negative.      Objective:   Physical Exam  Constitutional: She is oriented to person, place, and time. She appears well-developed and well-nourished.  HENT:  Head: Normocephalic.  Eyes: Pupils are equal, round, and reactive to light.  Neck: Normal range of motion.  Cardiovascular: Normal rate and regular rhythm.  Exam reveals no gallop and no friction rub.   No murmur heard. Pulmonary/Chest: Effort normal and breath sounds normal. No respiratory distress. She has no wheezes. She has no rales. She exhibits no tenderness.  Abdominal: Soft. She exhibits no distension. There is no tenderness.  Musculoskeletal: Normal range of motion.  Neurological: She is alert and oriented to person, place, and time. She has normal reflexes.  Skin: Skin is warm.  Psychiatric: She has a normal mood and affect. Her behavior is normal. Judgment and thought content normal.     BP 112/70 mmHg  Pulse 85  Temp(Src) 97 F (36.1 C) (Oral)  Ht $R'5\' 6"'XZ$  (1.676 m)  Wt 175 lb (79.379 kg)  BMI 28.26 kg/m2      Assessment & Plan:  1. Essential hypertension Do not add salt to diet - lisinopril-hydrochlorothiazide (PRINZIDE,ZESTORETIC) 20-12.5 MG per tablet; Take 1 tablet by mouth every evening.  Dispense: 30 tablet; Refill: 5 - CMP14+EGFR - NMR, lipoprofile  2. Hypothyroidism, unspecified hypothyroidism type - SYNTHROID 88 MCG tablet; TAKE 1 TABLET (88 MCG TOTAL) BY MOUTH DAILY BEFORE BREAKFAST.  Dispense: 30 tablet; Refill: 5  3. 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

## 2014-07-10 NOTE — Patient Instructions (Signed)

## 2014-07-11 LAB — NMR, LIPOPROFILE
Cholesterol: 222 mg/dL — ABNORMAL HIGH (ref 100–199)
HDL Cholesterol by NMR: 53 mg/dL (ref >39)
HDL Particle Number: 21.2 umol/L — ABNORMAL LOW (ref >=30.5)
LDL PARTICLE NUMBER: 1660 nmol/L — AB (ref <1000)
LDL SIZE: 21.2 nm (ref >20.5)
LDL-C: 145 mg/dL — ABNORMAL HIGH (ref 0–99)
LP-IR Score: <25 (ref <=45)
Triglycerides by NMR: 120 mg/dL (ref 0–149)

## 2014-07-11 LAB — CMP14+EGFR
ALT: 32 IU/L (ref 0–32)
AST: 35 IU/L (ref 0–40)
Albumin/Globulin Ratio: 1.5 (ref 1.1–2.5)
Albumin: 4.2 g/dL (ref 3.6–4.8)
Alkaline Phosphatase: 83 IU/L (ref 39–117)
BILIRUBIN TOTAL: 0.4 mg/dL (ref 0.0–1.2)
BUN / CREAT RATIO: 20 (ref 11–26)
BUN: 16 mg/dL (ref 8–27)
CALCIUM: 10.1 mg/dL (ref 8.7–10.3)
CO2: 24 mmol/L (ref 18–29)
Chloride: 98 mmol/L (ref 97–108)
Creatinine, Ser: 0.79 mg/dL (ref 0.57–1.00)
GFR calc Af Amer: 91 mL/min/{1.73_m2} (ref >59)
GFR, EST NON AFRICAN AMERICAN: 79 mL/min/{1.73_m2} (ref >59)
Globulin, Total: 2.8 g/dL (ref 1.5–4.5)
Glucose: 89 mg/dL (ref 65–99)
POTASSIUM: 4.1 mmol/L (ref 3.5–5.2)
SODIUM: 141 mmol/L (ref 134–144)
Total Protein: 7 g/dL (ref 6.0–8.5)

## 2014-07-17 ENCOUNTER — Other Ambulatory Visit (HOSPITAL_BASED_OUTPATIENT_CLINIC_OR_DEPARTMENT_OTHER): Payer: Medicare Other

## 2014-07-17 ENCOUNTER — Ambulatory Visit (HOSPITAL_BASED_OUTPATIENT_CLINIC_OR_DEPARTMENT_OTHER): Payer: Medicare Other

## 2014-07-17 DIAGNOSIS — C801 Malignant (primary) neoplasm, unspecified: Secondary | ICD-10-CM

## 2014-07-17 DIAGNOSIS — C221 Intrahepatic bile duct carcinoma: Secondary | ICD-10-CM

## 2014-07-17 DIAGNOSIS — Z5111 Encounter for antineoplastic chemotherapy: Secondary | ICD-10-CM | POA: Diagnosis not present

## 2014-07-17 DIAGNOSIS — C787 Secondary malignant neoplasm of liver and intrahepatic bile duct: Secondary | ICD-10-CM | POA: Diagnosis not present

## 2014-07-17 LAB — COMPREHENSIVE METABOLIC PANEL (CC13)
ALBUMIN: 3.9 g/dL (ref 3.5–5.0)
ALT: 24 U/L (ref 0–55)
ANION GAP: 6 meq/L (ref 3–11)
AST: 24 U/L (ref 5–34)
Alkaline Phosphatase: 86 U/L (ref 40–150)
BUN: 13 mg/dL (ref 7.0–26.0)
CALCIUM: 9.8 mg/dL (ref 8.4–10.4)
CHLORIDE: 104 meq/L (ref 98–109)
CO2: 28 mEq/L (ref 22–29)
CREATININE: 0.8 mg/dL (ref 0.6–1.1)
EGFR: 77 mL/min/{1.73_m2} — ABNORMAL LOW (ref >90)
GLUCOSE: 88 mg/dL (ref 70–140)
POTASSIUM: 4.1 meq/L (ref 3.5–5.1)
Sodium: 139 mEq/L (ref 136–145)
Total Bilirubin: 0.37 mg/dL (ref 0.20–1.20)
Total Protein: 7 g/dL (ref 6.4–8.3)

## 2014-07-17 LAB — CBC WITH DIFFERENTIAL/PLATELET
BASO%: 0.6 % (ref 0.0–2.0)
BASOS ABS: 0 10*3/uL (ref 0.0–0.1)
EOS%: 1.7 % (ref 0.0–7.0)
Eosinophils Absolute: 0.1 10*3/uL (ref 0.0–0.5)
HCT: 34.8 % (ref 34.8–46.6)
HGB: 11.5 g/dL — ABNORMAL LOW (ref 11.6–15.9)
LYMPH%: 17.6 % (ref 14.0–49.7)
MCH: 33.8 pg (ref 25.1–34.0)
MCHC: 33.2 g/dL (ref 31.5–36.0)
MCV: 101.9 fL — ABNORMAL HIGH (ref 79.5–101.0)
MONO#: 0.8 10*3/uL (ref 0.1–0.9)
MONO%: 9.7 % (ref 0.0–14.0)
NEUT%: 70.4 % (ref 38.4–76.8)
NEUTROS ABS: 5.6 10*3/uL (ref 1.5–6.5)
Platelets: 215 10*3/uL (ref 145–400)
RBC: 3.41 10*6/uL — AB (ref 3.70–5.45)
RDW: 18 % — AB (ref 11.2–14.5)
WBC: 8 10*3/uL (ref 3.9–10.3)
lymph#: 1.4 10*3/uL (ref 0.9–3.3)

## 2014-07-17 MED ORDER — GEMCITABINE HCL CHEMO INJECTION 1 GM/26.3ML
1000.0000 mg/m2 | Freq: Once | INTRAVENOUS | Status: AC
  Administered 2014-07-17: 1938 mg via INTRAVENOUS
  Filled 2014-07-17: qty 50.97

## 2014-07-17 MED ORDER — PROCHLORPERAZINE MALEATE 10 MG PO TABS
ORAL_TABLET | ORAL | Status: AC
  Filled 2014-07-17: qty 1

## 2014-07-17 MED ORDER — PROCHLORPERAZINE MALEATE 10 MG PO TABS
10.0000 mg | ORAL_TABLET | Freq: Once | ORAL | Status: AC
  Administered 2014-07-17: 10 mg via ORAL

## 2014-07-17 MED ORDER — SODIUM CHLORIDE 0.9 % IV SOLN
Freq: Once | INTRAVENOUS | Status: AC
  Administered 2014-07-17: 15:00:00 via INTRAVENOUS

## 2014-07-17 NOTE — Patient Instructions (Signed)
[facility] Discharge Instructions for Patients Receiving Chemotherapy  Today you received the following chemotherapy agents Gemzar.  To help prevent nausea and vomiting after your treatment, we encourage you to take your nausea medication as prescribed.   If you develop nausea and vomiting that is not controlled by your nausea medication, call the clinic.   BELOW ARE SYMPTOMS THAT SHOULD BE REPORTED IMMEDIATELY:  *FEVER GREATER THAN 100.5 F  *CHILLS WITH OR WITHOUT FEVER  NAUSEA AND VOMITING THAT IS NOT CONTROLLED WITH YOUR NAUSEA MEDICATION  *UNUSUAL SHORTNESS OF BREATH  *UNUSUAL BRUISING OR BLEEDING  TENDERNESS IN MOUTH AND THROAT WITH OR WITHOUT PRESENCE OF ULCERS  *URINARY PROBLEMS  *BOWEL PROBLEMS  UNUSUAL RASH Items with * indicate a potential emergency and should be followed up as soon as possible.  Feel free to call the clinic you have any questions or concerns. The clinic phone number is [phone].    

## 2014-07-31 ENCOUNTER — Ambulatory Visit (HOSPITAL_BASED_OUTPATIENT_CLINIC_OR_DEPARTMENT_OTHER): Payer: Medicare Other

## 2014-07-31 ENCOUNTER — Other Ambulatory Visit (HOSPITAL_BASED_OUTPATIENT_CLINIC_OR_DEPARTMENT_OTHER): Payer: Medicare Other

## 2014-07-31 ENCOUNTER — Ambulatory Visit (HOSPITAL_BASED_OUTPATIENT_CLINIC_OR_DEPARTMENT_OTHER): Payer: Medicare Other | Admitting: Oncology

## 2014-07-31 ENCOUNTER — Telehealth: Payer: Self-pay | Admitting: Oncology

## 2014-07-31 ENCOUNTER — Telehealth: Payer: Self-pay | Admitting: *Deleted

## 2014-07-31 VITALS — BP 131/60 | HR 74 | Temp 98.4°F | Resp 18 | Ht 66.0 in | Wt 174.9 lb

## 2014-07-31 DIAGNOSIS — C787 Secondary malignant neoplasm of liver and intrahepatic bile duct: Secondary | ICD-10-CM

## 2014-07-31 DIAGNOSIS — Z5111 Encounter for antineoplastic chemotherapy: Secondary | ICD-10-CM | POA: Diagnosis not present

## 2014-07-31 DIAGNOSIS — C801 Malignant (primary) neoplasm, unspecified: Secondary | ICD-10-CM

## 2014-07-31 DIAGNOSIS — C221 Intrahepatic bile duct carcinoma: Secondary | ICD-10-CM

## 2014-07-31 LAB — CBC WITH DIFFERENTIAL/PLATELET
BASO%: 0.8 % (ref 0.0–2.0)
BASOS ABS: 0.1 10*3/uL (ref 0.0–0.1)
EOS ABS: 0.1 10*3/uL (ref 0.0–0.5)
EOS%: 1.8 % (ref 0.0–7.0)
HCT: 36.1 % (ref 34.8–46.6)
HEMOGLOBIN: 12 g/dL (ref 11.6–15.9)
LYMPH%: 15.9 % (ref 14.0–49.7)
MCH: 33.8 pg (ref 25.1–34.0)
MCHC: 33.2 g/dL (ref 31.5–36.0)
MCV: 101.9 fL — ABNORMAL HIGH (ref 79.5–101.0)
MONO#: 0.8 10*3/uL (ref 0.1–0.9)
MONO%: 10 % (ref 0.0–14.0)
NEUT%: 71.5 % (ref 38.4–76.8)
NEUTROS ABS: 5.8 10*3/uL (ref 1.5–6.5)
PLATELETS: 225 10*3/uL (ref 145–400)
RBC: 3.54 10*6/uL — ABNORMAL LOW (ref 3.70–5.45)
RDW: 17.7 % — ABNORMAL HIGH (ref 11.2–14.5)
WBC: 8.1 10*3/uL (ref 3.9–10.3)
lymph#: 1.3 10*3/uL (ref 0.9–3.3)

## 2014-07-31 LAB — COMPREHENSIVE METABOLIC PANEL (CC13)
ALBUMIN: 3.9 g/dL (ref 3.5–5.0)
ALK PHOS: 92 U/L (ref 40–150)
ALT: 22 U/L (ref 0–55)
AST: 23 U/L (ref 5–34)
Anion Gap: 10 mEq/L (ref 3–11)
BUN: 18.2 mg/dL (ref 7.0–26.0)
CALCIUM: 9.9 mg/dL (ref 8.4–10.4)
CO2: 28 mEq/L (ref 22–29)
CREATININE: 0.9 mg/dL (ref 0.6–1.1)
Chloride: 101 mEq/L (ref 98–109)
EGFR: 65 mL/min/{1.73_m2} — ABNORMAL LOW (ref >90)
Glucose: 91 mg/dl (ref 70–140)
Potassium: 4.1 mEq/L (ref 3.5–5.1)
SODIUM: 139 meq/L (ref 136–145)
Total Bilirubin: 0.41 mg/dL (ref 0.20–1.20)
Total Protein: 7.2 g/dL (ref 6.4–8.3)

## 2014-07-31 MED ORDER — SODIUM CHLORIDE 0.9 % IJ SOLN
10.0000 mL | INTRAMUSCULAR | Status: DC | PRN
Start: 2014-07-31 — End: 2014-07-31
  Administered 2014-07-31: 10 mL
  Filled 2014-07-31: qty 10

## 2014-07-31 MED ORDER — PROCHLORPERAZINE MALEATE 10 MG PO TABS
10.0000 mg | ORAL_TABLET | Freq: Once | ORAL | Status: AC
  Administered 2014-07-31: 10 mg via ORAL

## 2014-07-31 MED ORDER — SODIUM CHLORIDE 0.9 % IV SOLN
2000.0000 mg | Freq: Once | INTRAVENOUS | Status: AC
  Administered 2014-07-31: 2000 mg via INTRAVENOUS
  Filled 2014-07-31: qty 52.6

## 2014-07-31 MED ORDER — SODIUM CHLORIDE 0.9 % IV SOLN
Freq: Once | INTRAVENOUS | Status: AC
  Administered 2014-07-31: 15:00:00 via INTRAVENOUS

## 2014-07-31 MED ORDER — HEPARIN SOD (PORK) LOCK FLUSH 100 UNIT/ML IV SOLN
500.0000 [IU] | Freq: Once | INTRAVENOUS | Status: AC | PRN
  Administered 2014-07-31: 500 [IU]
  Filled 2014-07-31: qty 5

## 2014-07-31 MED ORDER — PROCHLORPERAZINE MALEATE 10 MG PO TABS
ORAL_TABLET | ORAL | Status: AC
  Filled 2014-07-31: qty 1

## 2014-07-31 NOTE — Progress Notes (Signed)
Hematology and Oncology Follow Up Visit  Lindsay Aguirre [id number] 1950-07-28 64 y.o. 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 This was confirmed by biopsy on 08/14/2013 and imaging studies showed multifocal hepatic lesions.   Prior Therapy: Status post a liver biopsy on 08/14/2013.  Current therapy: Gemcitabine at 1000 mg per meter squared started on 09/06/2013 given weekly 3 weeks on and one week off for 6 cycles.  She is currently receiving her chemotherapy every other week for better tolerance.  Interim History: Lindsay Aguirre presents for followup. Since her last visit, she continues to do relatively well. She reports occasional nausea controlled with just Zofran. She is tolerating chemotherapy with very few issues. She does report some arthralgias and myalgia I grade 1. She does not report any fatigue and she continues to be active.  She continues to have an excellent quality of life and able to attends activities of daily living. She is not reporting any abdominal pain or hematochezia.  She reports no fever, chills, shortness of breath chest pain, night sweats or diarrhea.  Has not reported any hematochezia or melena. Has not reported any decline in her energy level or performance status. She continues to perform her activities of daily living without any decline. She has not reported any syncope or neurological changes. She has not reported any arthralgias or myalgias. Did not report any skin rashes or lesions. Did not report any lymphadenopathy or petechiae. She had no infusion-related complications with chemotherapy.  Remainder of her review of system is unremarkable.  Medications: I have reviewed the patient's current medications. Unchanged by my review. Current Outpatient Prescriptions  Medication Sig Dispense Refill  .  allopurinol (ZYLOPRIM) 300 MG tablet TAKE 1 TABLET (300 MG TOTAL) BY MOUTH DAILY. 30 tablet 2  . bifidobacterium infantis (ALIGN) capsule Take 1 capsule by mouth daily.    [name] BIOTIN PO Take 1 tablet by mouth daily.    . Cholecalciferol (VITAMIN D) 2000 UNITS tablet Take 2,000 Units by mouth daily.    [name] Sodium (COLACE PO) Take 3 capsules by mouth daily.    [name] HYDROcodone-acetaminophen (NORCO/VICODIN) 5-325 MG per tablet Take 1 tablet by mouth every 6 (six) hours as needed for moderate pain. 30 tablet 0  . ibuprofen (ADVIL,MOTRIN) 800 MG tablet   1  . lidocaine-prilocaine (EMLA) cream Apply 1 application topically as needed. Apply cream, on days of treatment, to [facility] site 1-2 hours prior to treatment. 30 g 0  . lisinopril-hydrochlorothiazide (PRINZIDE,ZESTORETIC) 20-12.5 MG per tablet Take 1 tablet by mouth every evening. 30 tablet 5  . ondansetron (ZOFRAN) 8 MG tablet Take 1 tablet (8 mg total) by mouth every 8 (eight) hours as needed for nausea or vomiting. 30 tablet 1  . polyethylene glycol powder (GLYCOLAX/MIRALAX) powder TAKE 1 CONTAINER TOTAL ONCE AS DIRECTED 255 g 1  . prochlorperazine (COMPAZINE) 10 MG tablet Take 1 tablet (10 mg total) by mouth every 6 (six) hours as needed for nausea or vomiting. 30 tablet 1  . senna (SENOKOT) 8.6 MG TABS tablet Take 2 tablets (17.2 mg total) by mouth daily. 60 each 0  . SYNTHROID 88 MCG tablet TAKE 1 TABLET (88 MCG TOTAL) BY MOUTH DAILY BEFORE BREAKFAST. 30 tablet 5  . vitamin C (ASCORBIC ACID) 500 MG tablet Take 500 mg by mouth daily.     No current facility-administered medications for this visit.  Allergies:  Allergies  Allergen Reactions  . Livalo [Pitavastatin]     Leg cramps  . Simvastatin     Leg cramps  . Statins     Leg Cramps     Past Medical History, Surgical history, Social history, and Family History were reviewed and updated.  Physical Exam: Blood pressure 131/60, pulse 74, temperature 98.4 F (36.9 C), temperature  source Oral, resp. rate 18, height 5\' 6"  (1.676 m), weight 174 lb 14.4 oz (79.334 kg), SpO2 100 %. ECOG: 0 General appearance: alert and cooperative not in any distress. Head: Normocephalic, without obvious abnormality Neck: no adenopathy Lymph nodes: Cervical, supraclavicular, and axillary nodes normal. Heart:regular rate and rhythm, S1, S2.  Lung:chest clear, no wheezing. Dullness to percussion. Abdomin: soft, non-tender, without masses or organomegaly no rebound or guarding. EXT:no erythema, induration, or edema Skin: No rashes or lesions. Neurologically: intact.   Lab Results: Lab Results  Component Value Date   WBC 8.1 07/31/2014   HGB 12.0 07/31/2014   HCT 36.1 07/31/2014   MCV 101.9* 07/31/2014   PLT 225 07/31/2014     Chemistry      Component Value Date/Time   NA 139 07/17/2014 1343   NA 141 07/10/2014 1306   NA 140 01/16/2014 1302   K 4.1 07/17/2014 1343   K 4.1 07/10/2014 1306   CL 98 07/10/2014 1306   CO2 28 07/17/2014 1343   CO2 24 07/10/2014 1306   BUN 13.0 07/17/2014 1343   BUN 16 07/10/2014 1306   BUN 10 01/16/2014 1302   CREATININE 0.8 07/17/2014 1343   CREATININE 0.79 07/10/2014 1306   CREATININE 0.81 10/24/2012 1203      Component Value Date/Time   CALCIUM 9.8 07/17/2014 1343   CALCIUM 10.1 07/10/2014 1306   ALKPHOS 86 07/17/2014 1343   ALKPHOS 83 07/10/2014 1306   AST 24 07/17/2014 1343   AST 35 07/10/2014 1306   ALT 24 07/17/2014 1343   ALT 32 07/10/2014 1306   BILITOT 0.37 07/17/2014 1343   BILITOT 0.4 07/10/2014 1306   BILITOT 0.5 01/16/2014 4740        64 year old woman with the following issues:  1. 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 She is on chemotherapy with gemcitabine 1000 mg day 1 and 15 of a 28 day cycle.  CT scan from 06/03/2014 showed for the most part stable disease.   The plan is to continue with the current dose and schedule and repeat imaging studies in June  2016 for be scheduled before she leaves today.  2. IV access: Status post Port-A-Cath placement on 09/03/2013. No complications since the last visit, no evidence of infection   3. Antiemetics: Continue Zofran as needed. Compazine was added as well.  4. Constipation: She uses MiraLax with excellent response.  5. Followup: In 2 weeks for her next cycle of chemotherapy.  Zola Button, MD 3/31/20162:40 PM

## 2014-07-31 NOTE — Telephone Encounter (Signed)
Gave avs & calendar for Apri/May/June. Sent message to schedule treatment. Also gave CT contrast for scan.

## 2014-07-31 NOTE — Telephone Encounter (Signed)
Per staff message and POF I have scheduled appts. Advised scheduler of appts. JMW  

## 2014-07-31 NOTE — Patient Instructions (Signed)
[facility] Discharge Instructions for Patients Receiving Chemotherapy  Today you received the following chemotherapy agents Gemzar.  To help prevent nausea and vomiting after your treatment, we encourage you to take your nausea medication as prescribed.   If you develop nausea and vomiting that is not controlled by your nausea medication, call the clinic.   BELOW ARE SYMPTOMS THAT SHOULD BE REPORTED IMMEDIATELY:  *FEVER GREATER THAN 100.5 F  *CHILLS WITH OR WITHOUT FEVER  NAUSEA AND VOMITING THAT IS NOT CONTROLLED WITH YOUR NAUSEA MEDICATION  *UNUSUAL SHORTNESS OF BREATH  *UNUSUAL BRUISING OR BLEEDING  TENDERNESS IN MOUTH AND THROAT WITH OR WITHOUT PRESENCE OF ULCERS  *URINARY PROBLEMS  *BOWEL PROBLEMS  UNUSUAL RASH Items with * indicate a potential emergency and should be followed up as soon as possible.  Feel free to call the clinic you have any questions or concerns. The clinic phone number is [phone].    

## 2014-08-11 ENCOUNTER — Telehealth: Payer: Self-pay | Admitting: *Deleted

## 2014-08-11 NOTE — Telephone Encounter (Signed)
THROAT IS DRY AND SCRATCHY. PT. HAS AN OCCASIONAL DRY COUGH. NO FEVER. PT. STATES HER "SINUSES ARE IRRITATED". PT. RECEIVES GEMZAR ON 08/14/14. SHE SEES [place] ON 08/28/14. INSTRUCTED PT. TO FORCE FLUIDS. ANY OTHER SUGGESTIONS? THIS NOTE WAS ROUTED TO DR.Hitterdal.

## 2014-08-11 NOTE — Telephone Encounter (Signed)
Spoke with patient, suggested throat lozenges, warm salt water rinses qid, cough suppressant, keep hydrated. Report to E.R. For elevated temp. Patient verbalized understanding.

## 2014-08-14 ENCOUNTER — Ambulatory Visit (HOSPITAL_BASED_OUTPATIENT_CLINIC_OR_DEPARTMENT_OTHER): Payer: Medicare Other

## 2014-08-14 ENCOUNTER — Other Ambulatory Visit (HOSPITAL_BASED_OUTPATIENT_CLINIC_OR_DEPARTMENT_OTHER): Payer: Medicare Other

## 2014-08-14 ENCOUNTER — Other Ambulatory Visit: Payer: Self-pay | Admitting: Oncology

## 2014-08-14 VITALS — BP 145/67 | HR 76 | Temp 98.2°F | Resp 18

## 2014-08-14 DIAGNOSIS — C221 Intrahepatic bile duct carcinoma: Secondary | ICD-10-CM

## 2014-08-14 DIAGNOSIS — C787 Secondary malignant neoplasm of liver and intrahepatic bile duct: Secondary | ICD-10-CM

## 2014-08-14 DIAGNOSIS — Z5111 Encounter for antineoplastic chemotherapy: Secondary | ICD-10-CM | POA: Diagnosis present

## 2014-08-14 DIAGNOSIS — C801 Malignant (primary) neoplasm, unspecified: Secondary | ICD-10-CM

## 2014-08-14 DIAGNOSIS — J069 Acute upper respiratory infection, unspecified: Secondary | ICD-10-CM

## 2014-08-14 LAB — CBC WITH DIFFERENTIAL/PLATELET
BASO%: 0.4 % (ref 0.0–2.0)
Basophils Absolute: 0 10*3/uL (ref 0.0–0.1)
EOS%: 2.1 % (ref 0.0–7.0)
Eosinophils Absolute: 0.2 10*3/uL (ref 0.0–0.5)
HEMATOCRIT: 33.9 % — AB (ref 34.8–46.6)
HGB: 11.3 g/dL — ABNORMAL LOW (ref 11.6–15.9)
LYMPH%: 12.5 % — ABNORMAL LOW (ref 14.0–49.7)
MCH: 33.8 pg (ref 25.1–34.0)
MCHC: 33.4 g/dL (ref 31.5–36.0)
MCV: 101.5 fL — AB (ref 79.5–101.0)
MONO#: 0.8 10*3/uL (ref 0.1–0.9)
MONO%: 9.6 % (ref 0.0–14.0)
NEUT#: 6.5 10*3/uL (ref 1.5–6.5)
NEUT%: 75.4 % (ref 38.4–76.8)
PLATELETS: 217 10*3/uL (ref 145–400)
RBC: 3.34 10*6/uL — ABNORMAL LOW (ref 3.70–5.45)
RDW: 17.9 % — ABNORMAL HIGH (ref 11.2–14.5)
WBC: 8.6 10*3/uL (ref 3.9–10.3)
lymph#: 1.1 10*3/uL (ref 0.9–3.3)

## 2014-08-14 LAB — COMPREHENSIVE METABOLIC PANEL (CC13)
ALT: 22 U/L (ref 0–55)
ANION GAP: 7 meq/L (ref 3–11)
AST: 23 U/L (ref 5–34)
Albumin: 3.7 g/dL (ref 3.5–5.0)
Alkaline Phosphatase: 84 U/L (ref 40–150)
BUN: 12 mg/dL (ref 7.0–26.0)
CO2: 28 meq/L (ref 22–29)
CREATININE: 0.8 mg/dL (ref 0.6–1.1)
Calcium: 9.4 mg/dL (ref 8.4–10.4)
Chloride: 106 mEq/L (ref 98–109)
EGFR: 80 mL/min/{1.73_m2} — ABNORMAL LOW (ref >90)
GLUCOSE: 106 mg/dL (ref 70–140)
Potassium: 3.7 mEq/L (ref 3.5–5.1)
SODIUM: 141 meq/L (ref 136–145)
Total Bilirubin: 0.46 mg/dL (ref 0.20–1.20)
Total Protein: 6.6 g/dL (ref 6.4–8.3)

## 2014-08-14 MED ORDER — SODIUM CHLORIDE 0.9 % IJ SOLN
10.0000 mL | INTRAMUSCULAR | Status: DC | PRN
  Administered 2014-08-14: 10 mL
  Filled 2014-08-14: qty 10

## 2014-08-14 MED ORDER — DM-GUAIFENESIN ER 30-600 MG PO TB12
1.0000 | ORAL_TABLET | Freq: Two times a day (BID) | ORAL | Status: DC
Start: 2014-08-14 — End: 2014-11-07

## 2014-08-14 MED ORDER — PROCHLORPERAZINE MALEATE 10 MG PO TABS
10.0000 mg | ORAL_TABLET | Freq: Once | ORAL | Status: AC
  Administered 2014-08-14: 10 mg via ORAL

## 2014-08-14 MED ORDER — PROCHLORPERAZINE MALEATE 10 MG PO TABS
ORAL_TABLET | ORAL | Status: AC
  Filled 2014-08-14: qty 1

## 2014-08-14 MED ORDER — SODIUM CHLORIDE 0.9 % IV SOLN
Freq: Once | INTRAVENOUS | Status: AC
  Administered 2014-08-14: 15:00:00 via INTRAVENOUS

## 2014-08-14 MED ORDER — HEPARIN SOD (PORK) LOCK FLUSH 100 UNIT/ML IV SOLN
500.0000 [IU] | Freq: Once | INTRAVENOUS | Status: AC | PRN
  Administered 2014-08-14: 500 [IU]
  Filled 2014-08-14: qty 5

## 2014-08-14 MED ORDER — GEMCITABINE HCL CHEMO INJECTION 1 GM/26.3ML
2000.0000 mg | Freq: Once | INTRAVENOUS | Status: AC
  Administered 2014-08-14: 2000 mg via INTRAVENOUS
  Filled 2014-08-14: qty 52.6

## 2014-08-14 NOTE — Progress Notes (Signed)
Pt reports runny nose and non-productive cough x 5 days.  Reports calling triage and was told to take over the counter cough medicine.  States she is taking mucinex and head pressure feels improved but now feels like it is in her chest. States cough is worse and feels tight in her chest when she is coughing. Denies fever or sob.  Dr. Alen Blew notified, okay to proceed with treatment today as scheduled, will look into calling rx in for patient. Pt verbalized understanding.

## 2014-08-14 NOTE — Patient Instructions (Signed)
[facility] Discharge Instructions for Patients Receiving Chemotherapy  Today you received the following chemotherapy agents Gemzar  To help prevent nausea and vomiting after your treatment, we encourage you to take your nausea medication as directed.    If you develop nausea and vomiting that is not controlled by your nausea medication, call the clinic.   BELOW ARE SYMPTOMS THAT SHOULD BE REPORTED IMMEDIATELY:  *FEVER GREATER THAN 100.5 F  *CHILLS WITH OR WITHOUT FEVER  NAUSEA AND VOMITING THAT IS NOT CONTROLLED WITH YOUR NAUSEA MEDICATION  *UNUSUAL SHORTNESS OF BREATH  *UNUSUAL BRUISING OR BLEEDING  TENDERNESS IN MOUTH AND THROAT WITH OR WITHOUT PRESENCE OF ULCERS  *URINARY PROBLEMS  *BOWEL PROBLEMS  UNUSUAL RASH Items with * indicate a potential emergency and should be followed up as soon as possible.  Feel free to call the clinic you have any questions or concerns. The clinic phone number is [phone].  Please show the CHEMO ALERT CARD at check-in to the Emergency Department and triage nurse.   

## 2014-08-18 ENCOUNTER — Ambulatory Visit (INDEPENDENT_AMBULATORY_CARE_PROVIDER_SITE_OTHER): Payer: Medicare Other

## 2014-08-18 ENCOUNTER — Encounter: Payer: Self-pay | Admitting: Nurse Practitioner

## 2014-08-18 ENCOUNTER — Ambulatory Visit (INDEPENDENT_AMBULATORY_CARE_PROVIDER_SITE_OTHER): Payer: Medicare Other | Admitting: Nurse Practitioner

## 2014-08-18 VITALS — BP 98/62 | HR 92 | Temp 97.2°F | Ht 66.0 in | Wt 179.0 lb

## 2014-08-18 DIAGNOSIS — M25512 Pain in left shoulder: Secondary | ICD-10-CM

## 2014-08-18 DIAGNOSIS — M79602 Pain in left arm: Secondary | ICD-10-CM | POA: Diagnosis not present

## 2014-08-18 MED ORDER — METHYLPREDNISOLONE ACETATE 80 MG/ML IJ SUSP
80.0000 mg | Freq: Once | INTRAMUSCULAR | Status: AC
  Administered 2014-08-18: 80 mg via INTRAMUSCULAR

## 2014-08-18 MED ORDER — MELOXICAM 15 MG PO TABS: 15.0000 mg | ORAL_TABLET | Freq: Every day | ORAL | Status: DC

## 2014-08-18 NOTE — Patient Instructions (Signed)

## 2014-08-18 NOTE — Progress Notes (Signed)
   Subjective:    Patient ID: Lindsay Aguirre, female    DOB: 07-09-1950, 64 y.o.   MRN: [id number]  HPI Patient in c/o left shoulder pain that radiates down her arm. On pain scale rates 7-10/10. Movement increases pain. Resting makes it better. Denies SOB and chest pain.    Review of Systems  Constitutional: Negative.   HENT: Negative.   Respiratory: Negative.  Negative for shortness of breath.   Cardiovascular: Negative.  Negative for chest pain.  Genitourinary: Negative.   Neurological: Negative.   Psychiatric/Behavioral: Negative.   All other systems reviewed and are negative.      Objective:   Physical Exam  Constitutional: She is oriented to person, place, and time. She appears well-developed and well-nourished.  Cardiovascular: Normal rate, regular rhythm and normal heart sounds.   Pulmonary/Chest: Effort normal and breath sounds normal.  Musculoskeletal:  FROM of left shoulder without pain- periscapular tenderness on palpation.  Grips equal bil  Neurological: She is alert and oriented to person, place, and time. She has normal reflexes.  Skin: Skin is warm.  Psychiatric: She has a normal mood and affect. Her behavior is normal. Judgment and thought content normal.    BP 98/62 mmHg  Pulse 92  Temp(Src) 97.2 F (36.2 C) (Oral)  Ht 5\' 6"  (1.676 m)  Wt 179 lb (81.194 kg)  BMI 28.91 kg/m2  EKG- NSR-[name] Hassell Done, FNP  Left shoulder x ray- normal-Preliminary reading by Ronnald Collum, FNP  [facility]      Assessment & Plan:   1. Left arm pain   2. Left shoulder pain    Meds ordered this encounter  Medications  . methylPREDNISolone acetate (DEPO-MEDROL) injection 80 mg    Sig:   . meloxicam (MOBIC) 15 MG tablet    Sig: Take 1 tablet (15 mg total) by mouth daily.    Dispense:  30 tablet    Refill:  3    Order Specific Question:  Supervising Provider    Answer:  Chipper Herb [[id number]]   Moist heat  Rest RTO prn  [name] Hassell Done, FNP

## 2014-08-28 ENCOUNTER — Encounter: Payer: Self-pay | Admitting: Physician Assistant

## 2014-08-28 ENCOUNTER — Ambulatory Visit (HOSPITAL_BASED_OUTPATIENT_CLINIC_OR_DEPARTMENT_OTHER): Payer: Medicare Other

## 2014-08-28 ENCOUNTER — Other Ambulatory Visit (HOSPITAL_BASED_OUTPATIENT_CLINIC_OR_DEPARTMENT_OTHER): Payer: Medicare Other

## 2014-08-28 ENCOUNTER — Ambulatory Visit (HOSPITAL_BASED_OUTPATIENT_CLINIC_OR_DEPARTMENT_OTHER): Payer: Medicare Other | Admitting: Physician Assistant

## 2014-08-28 VITALS — BP 102/69 | HR 70 | Temp 98.5°F | Resp 20 | Ht 66.0 in | Wt 175.8 lb

## 2014-08-28 DIAGNOSIS — C801 Malignant (primary) neoplasm, unspecified: Secondary | ICD-10-CM

## 2014-08-28 DIAGNOSIS — C221 Intrahepatic bile duct carcinoma: Secondary | ICD-10-CM

## 2014-08-28 DIAGNOSIS — Z5111 Encounter for antineoplastic chemotherapy: Secondary | ICD-10-CM

## 2014-08-28 DIAGNOSIS — C787 Secondary malignant neoplasm of liver and intrahepatic bile duct: Secondary | ICD-10-CM

## 2014-08-28 DIAGNOSIS — K5900 Constipation, unspecified: Secondary | ICD-10-CM

## 2014-08-28 DIAGNOSIS — M791 Myalgia: Secondary | ICD-10-CM

## 2014-08-28 DIAGNOSIS — M2550 Pain in unspecified joint: Secondary | ICD-10-CM

## 2014-08-28 LAB — CBC WITH DIFFERENTIAL/PLATELET
BASO%: 0.3 % (ref 0.0–2.0)
Basophils Absolute: 0 10*3/uL (ref 0.0–0.1)
EOS%: 1.7 % (ref 0.0–7.0)
Eosinophils Absolute: 0.2 10*3/uL (ref 0.0–0.5)
HCT: 36.1 % (ref 34.8–46.6)
HGB: 12.2 g/dL (ref 11.6–15.9)
LYMPH%: 13.2 % — ABNORMAL LOW (ref 14.0–49.7)
MCH: 34.4 pg — AB (ref 25.1–34.0)
MCHC: 33.8 g/dL (ref 31.5–36.0)
MCV: 101.7 fL — AB (ref 79.5–101.0)
MONO#: 0.8 10*3/uL (ref 0.1–0.9)
MONO%: 7.8 % (ref 0.0–14.0)
NEUT#: 8 10*3/uL — ABNORMAL HIGH (ref 1.5–6.5)
NEUT%: 77 % — AB (ref 38.4–76.8)
Platelets: 230 10*3/uL (ref 145–400)
RBC: 3.55 10*6/uL — ABNORMAL LOW (ref 3.70–5.45)
RDW: 17.5 % — AB (ref 11.2–14.5)
WBC: 10.4 10*3/uL — ABNORMAL HIGH (ref 3.9–10.3)
lymph#: 1.4 10*3/uL (ref 0.9–3.3)

## 2014-08-28 LAB — COMPREHENSIVE METABOLIC PANEL (CC13)
ALK PHOS: 96 U/L (ref 40–150)
ALT: 21 U/L (ref 0–55)
AST: 21 U/L (ref 5–34)
Albumin: 4 g/dL (ref 3.5–5.0)
Anion Gap: 12 mEq/L — ABNORMAL HIGH (ref 3–11)
BUN: 15.4 mg/dL (ref 7.0–26.0)
CO2: 25 meq/L (ref 22–29)
Calcium: 9.8 mg/dL (ref 8.4–10.4)
Chloride: 99 mEq/L (ref 98–109)
Creatinine: 1.1 mg/dL (ref 0.6–1.1)
EGFR: 51 mL/min/{1.73_m2} — AB (ref >90)
GLUCOSE: 97 mg/dL (ref 70–140)
Potassium: 4.1 mEq/L (ref 3.5–5.1)
Sodium: 136 mEq/L (ref 136–145)
Total Bilirubin: 0.34 mg/dL (ref 0.20–1.20)
Total Protein: 7.1 g/dL (ref 6.4–8.3)

## 2014-08-28 MED ORDER — GEMCITABINE HCL CHEMO INJECTION 1 GM/26.3ML
2000.0000 mg | Freq: Once | INTRAVENOUS | Status: AC
  Administered 2014-08-28: 2000 mg via INTRAVENOUS
  Filled 2014-08-28: qty 52.6

## 2014-08-28 MED ORDER — PROCHLORPERAZINE MALEATE 10 MG PO TABS
10.0000 mg | ORAL_TABLET | Freq: Once | ORAL | Status: AC
  Administered 2014-08-28: 10 mg via ORAL

## 2014-08-28 MED ORDER — HEPARIN SOD (PORK) LOCK FLUSH 100 UNIT/ML IV SOLN
500.0000 [IU] | Freq: Once | INTRAVENOUS | Status: AC | PRN
  Administered 2014-08-28: 500 [IU]
  Filled 2014-08-28: qty 5

## 2014-08-28 MED ORDER — SODIUM CHLORIDE 0.9 % IJ SOLN
10.0000 mL | INTRAMUSCULAR | Status: DC | PRN
  Administered 2014-08-28: 10 mL
  Filled 2014-08-28: qty 10

## 2014-08-28 MED ORDER — SODIUM CHLORIDE 0.9 % IV SOLN
Freq: Once | INTRAVENOUS | Status: AC
  Administered 2014-08-28: 15:00:00 via INTRAVENOUS

## 2014-08-28 MED ORDER — PROCHLORPERAZINE MALEATE 10 MG PO TABS
ORAL_TABLET | ORAL | Status: AC
  Filled 2014-08-28: qty 1

## 2014-08-28 NOTE — Patient Instructions (Signed)
[facility] Discharge Instructions for Patients Receiving Chemotherapy  Today you received the following chemotherapy agents Gemzar  To help prevent nausea and vomiting after your treatment, we encourage you to take your nausea medication as directed.    If you develop nausea and vomiting that is not controlled by your nausea medication, call the clinic.   BELOW ARE SYMPTOMS THAT SHOULD BE REPORTED IMMEDIATELY:  *FEVER GREATER THAN 100.5 F  *CHILLS WITH OR WITHOUT FEVER  NAUSEA AND VOMITING THAT IS NOT CONTROLLED WITH YOUR NAUSEA MEDICATION  *UNUSUAL SHORTNESS OF BREATH  *UNUSUAL BRUISING OR BLEEDING  TENDERNESS IN MOUTH AND THROAT WITH OR WITHOUT PRESENCE OF ULCERS  *URINARY PROBLEMS  *BOWEL PROBLEMS  UNUSUAL RASH Items with * indicate a potential emergency and should be followed up as soon as possible.  Feel free to call the clinic you have any questions or concerns. The clinic phone number is [phone].  Please show the CHEMO ALERT CARD at check-in to the Emergency Department and triage nurse.   

## 2014-08-28 NOTE — Progress Notes (Signed)
Hematology and Oncology Follow Up Visit  Lindsay Aguirre [id number] 03-07-1951 64 y.o. 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 This was confirmed by biopsy on 08/14/2013 and imaging studies showed multifocal hepatic lesions.   Prior Therapy: Status post a liver biopsy on 08/14/2013.  Current therapy: Gemcitabine at 1000 mg per meter squared started on 09/06/2013 given weekly 3 weeks on and one week off for 6 cycles.  She is currently receiving her chemotherapy every other week for better tolerance.  Interim History: Mrs. Word presents for followup. Since her last visit, she continues to do relatively well. She reports occasional nausea controlled with just Zofran. She is tolerating chemotherapy with very few issues. She does report some arthralgias and myalgia, grade 1. She does not report any fatigue and she continues to be active.  She continues to have an excellent quality of life and able to attends activities of daily living. She is not reporting any abdominal pain or hematochezia.  She reports no fever, chills, shortness of breath chest pain, night sweats or diarrhea.  Has not reported any hematochezia or melena. Has not reported any decline in her energy level or performance status. She continues to perform her activities of daily living without any decline. She has not reported any syncope or neurological changes. She has not reported any arthralgias or myalgias. Did not report any skin rashes or lesions. Did not report any lymphadenopathy or petechiae. She had no infusion-related complications with chemotherapy.  Remainder of her review of system is unremarkable.  Medications: I have reviewed the patient's current medications. Unchanged by my review. Current Outpatient Prescriptions  Medication Sig Dispense Refill  .  allopurinol (ZYLOPRIM) 300 MG tablet TAKE 1 TABLET (300 MG TOTAL) BY MOUTH DAILY. 30 tablet 2  . bifidobacterium infantis (ALIGN) capsule Take 1 capsule by mouth daily.    [name] BIOTIN PO Take 1 tablet by mouth daily.    . Cholecalciferol (VITAMIN D) 2000 UNITS tablet Take 2,000 Units by mouth daily.    [name] dextromethorphan-guaiFENesin (MUCINEX DM) 30-600 MG per 12 hr tablet Take 1 tablet by mouth 2 (two) times daily. 28 tablet 0  . Docusate Sodium (COLACE PO) Take 3 capsules by mouth daily.    [name] HYDROcodone-acetaminophen (NORCO/VICODIN) 5-325 MG per tablet Take 1 tablet by mouth every 6 (six) hours as needed for moderate pain. 30 tablet 0  . ibuprofen (ADVIL,MOTRIN) 800 MG tablet   1  . lidocaine-prilocaine (EMLA) cream Apply 1 application topically as needed. Apply cream, on days of treatment, to [facility] site 1-2 hours prior to treatment. 30 g 0  . lisinopril-hydrochlorothiazide (PRINZIDE,ZESTORETIC) 20-12.5 MG per tablet Take 1 tablet by mouth every evening. 30 tablet 5  . meloxicam (MOBIC) 15 MG tablet Take 1 tablet (15 mg total) by mouth daily. 30 tablet 3  . ondansetron (ZOFRAN) 8 MG tablet Take 1 tablet (8 mg total) by mouth every 8 (eight) hours as needed for nausea or vomiting. 30 tablet 1  . polyethylene glycol powder (GLYCOLAX/MIRALAX) powder TAKE 1 CONTAINER TOTAL ONCE AS DIRECTED 255 g 1  . prochlorperazine (COMPAZINE) 10 MG tablet Take 1 tablet (10 mg total) by mouth every 6 (six) hours as needed for nausea or vomiting. 30 tablet 1  . senna (SENOKOT) 8.6 MG TABS tablet Take 2 tablets (17.2 mg total) by mouth daily. 60 each 0  . SYNTHROID 88 MCG  tablet TAKE 1 TABLET (88 MCG TOTAL) BY MOUTH DAILY BEFORE BREAKFAST. 30 tablet 5  . vitamin C (ASCORBIC ACID) 500 MG tablet Take 500 mg by mouth daily.     No current facility-administered medications for this visit.   Facility-Administered Medications Ordered in Other Visits  Medication Dose Route Frequency Provider Last Rate Last Dose  . sodium  chloride 0.9 % injection 10 mL  10 mL Intracatheter PRN Wyatt Portela, MD   10 mL at 08/28/14 1537     Allergies:  Allergies  Allergen Reactions  . Livalo [Pitavastatin]     Leg cramps  . Simvastatin     Leg cramps  . Statins     Leg Cramps     Past Medical History, Surgical history, Social history, and Family History were reviewed and updated.  Physical Exam: Blood pressure 102/69, pulse 70, temperature 98.5 F (36.9 C), temperature source Oral, resp. rate 20, height 5\' 6"  (1.676 m), weight 175 lb 12.8 oz (79.742 kg), SpO2 98 %. ECOG: 0 General appearance: alert and cooperative not in any distress. Head: Normocephalic, without obvious abnormality Neck: no adenopathy Lymph nodes: Cervical, supraclavicular, and axillary nodes normal. Heart:regular rate and rhythm, S1, S2.  Lung:chest clear, no wheezing. Dullness to percussion. Abdomin: soft, non-tender, without masses or organomegaly no rebound or guarding. EXT:no erythema, induration, or edema Skin: No rashes or lesions. Neurologically: intact.   Lab Results: Lab Results  Component Value Date   WBC 10.4* 08/28/2014   HGB 12.2 08/28/2014   HCT 36.1 08/28/2014   MCV 101.7* 08/28/2014   PLT 230 08/28/2014     Chemistry      Component Value Date/Time   NA 136 08/28/2014 1420   NA 141 07/10/2014 1306   NA 140 01/16/2014 1302   K 4.1 08/28/2014 1420   K 4.1 07/10/2014 1306   CL 98 07/10/2014 1306   CO2 25 08/28/2014 1420   CO2 24 07/10/2014 1306   BUN 15.4 08/28/2014 1420   BUN 16 07/10/2014 1306   BUN 10 01/16/2014 1302   CREATININE 1.1 08/28/2014 1420   CREATININE 0.79 07/10/2014 1306   CREATININE 0.81 10/24/2012 1203      Component Value Date/Time   CALCIUM 9.8 08/28/2014 1420   CALCIUM 10.1 07/10/2014 1306   ALKPHOS 96 08/28/2014 1420   ALKPHOS 83 07/10/2014 1306   AST 21 08/28/2014 1420   AST 35 07/10/2014 1306   ALT 21 08/28/2014 1420   ALT 32 07/10/2014 1306   BILITOT 0.34 08/28/2014 1420    BILITOT 0.4 07/10/2014 1306   BILITOT 0.5 01/16/2014 3762        64 year old woman with the following issues:  1. 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 She is on chemotherapy with gemcitabine 1000 mg day 1 and 15 of a 28 day cycle.  CT scan from 06/03/2014 showed for the most part stable disease.   The plan is to continue with the current dose and schedule and repeat imaging studies in June 2016 for be scheduled before she leaves today.  2. IV access: Status post Port-A-Cath placement on 09/03/2013. No complications since the last visit, no evidence of infection   3. Antiemetics: Continue Zofran as needed. Compazine was added as well.  4. Constipation: She uses MiraLax with excellent response.  5. Followup: as scheduled for her chemotherapyand and on 10/09/2014 as previously scheduled with Dr. Alen Blew.  Carlton Adam, PA-C  4/28/20163:40 PM

## 2014-08-30 NOTE — Patient Instructions (Signed)
Follow up as scheduled.  

## 2014-09-01 ENCOUNTER — Telehealth: Payer: Self-pay | Admitting: Nurse Practitioner

## 2014-09-01 DIAGNOSIS — M25519 Pain in unspecified shoulder: Secondary | ICD-10-CM

## 2014-09-01 NOTE — Telephone Encounter (Signed)
Referral made to ortho

## 2014-09-01 NOTE — Telephone Encounter (Signed)
Patient aware and is requesting Dr. Louanne Skye at [place].

## 2014-09-10 ENCOUNTER — Other Ambulatory Visit: Payer: Self-pay | Admitting: Nurse Practitioner

## 2014-09-11 ENCOUNTER — Ambulatory Visit (HOSPITAL_BASED_OUTPATIENT_CLINIC_OR_DEPARTMENT_OTHER): Payer: Medicare Other

## 2014-09-11 ENCOUNTER — Other Ambulatory Visit (HOSPITAL_BASED_OUTPATIENT_CLINIC_OR_DEPARTMENT_OTHER): Payer: Medicare Other

## 2014-09-11 VITALS — BP 102/64 | HR 75 | Temp 98.2°F | Resp 16

## 2014-09-11 DIAGNOSIS — C221 Intrahepatic bile duct carcinoma: Secondary | ICD-10-CM

## 2014-09-11 DIAGNOSIS — Z5111 Encounter for antineoplastic chemotherapy: Secondary | ICD-10-CM | POA: Diagnosis present

## 2014-09-11 LAB — COMPREHENSIVE METABOLIC PANEL (CC13)
ALBUMIN: 3.9 g/dL (ref 3.5–5.0)
ALT: 21 U/L (ref 0–55)
AST: 25 U/L (ref 5–34)
Alkaline Phosphatase: 94 U/L (ref 40–150)
Anion Gap: 12 mEq/L — ABNORMAL HIGH (ref 3–11)
BUN: 18.6 mg/dL (ref 7.0–26.0)
CO2: 29 mEq/L (ref 22–29)
Calcium: 9.8 mg/dL (ref 8.4–10.4)
Chloride: 100 mEq/L (ref 98–109)
Creatinine: 1.1 mg/dL (ref 0.6–1.1)
EGFR: 50 mL/min/{1.73_m2} — AB (ref >90)
GLUCOSE: 95 mg/dL (ref 70–140)
POTASSIUM: 3.7 meq/L (ref 3.5–5.1)
SODIUM: 140 meq/L (ref 136–145)
TOTAL PROTEIN: 7 g/dL (ref 6.4–8.3)
Total Bilirubin: 0.56 mg/dL (ref 0.20–1.20)

## 2014-09-11 LAB — CBC WITH DIFFERENTIAL/PLATELET
BASO%: 0.7 % (ref 0.0–2.0)
BASOS ABS: 0.1 10*3/uL (ref 0.0–0.1)
EOS ABS: 0.2 10*3/uL (ref 0.0–0.5)
EOS%: 1.6 % (ref 0.0–7.0)
HEMATOCRIT: 36 % (ref 34.8–46.6)
HEMOGLOBIN: 12.1 g/dL (ref 11.6–15.9)
LYMPH%: 11.2 % — AB (ref 14.0–49.7)
MCH: 34.6 pg — ABNORMAL HIGH (ref 25.1–34.0)
MCHC: 33.7 g/dL (ref 31.5–36.0)
MCV: 102.6 fL — AB (ref 79.5–101.0)
MONO#: 0.9 10*3/uL (ref 0.1–0.9)
MONO%: 9.4 % (ref 0.0–14.0)
NEUT%: 77.1 % — AB (ref 38.4–76.8)
NEUTROS ABS: 7.3 10*3/uL — AB (ref 1.5–6.5)
PLATELETS: 203 10*3/uL (ref 145–400)
RBC: 3.5 10*6/uL — ABNORMAL LOW (ref 3.70–5.45)
RDW: 18.2 % — ABNORMAL HIGH (ref 11.2–14.5)
WBC: 9.4 10*3/uL (ref 3.9–10.3)
lymph#: 1.1 10*3/uL (ref 0.9–3.3)

## 2014-09-11 MED ORDER — GEMCITABINE HCL CHEMO INJECTION 1 GM/26.3ML
2000.0000 mg | Freq: Once | INTRAVENOUS | Status: AC
  Administered 2014-09-11: 2000 mg via INTRAVENOUS
  Filled 2014-09-11: qty 52.6

## 2014-09-11 MED ORDER — SODIUM CHLORIDE 0.9 % IV SOLN
Freq: Once | INTRAVENOUS | Status: AC
  Administered 2014-09-11: 15:00:00 via INTRAVENOUS

## 2014-09-11 MED ORDER — PROCHLORPERAZINE MALEATE 10 MG PO TABS
ORAL_TABLET | ORAL | Status: AC
  Filled 2014-09-11: qty 1

## 2014-09-11 MED ORDER — PROCHLORPERAZINE MALEATE 10 MG PO TABS
10.0000 mg | ORAL_TABLET | Freq: Once | ORAL | Status: AC
  Administered 2014-09-11: 10 mg via ORAL

## 2014-09-11 NOTE — Patient Instructions (Signed)
[facility] Discharge Instructions for Patients Receiving Chemotherapy  Today you received the following chemotherapy agents Gemzar.  To help prevent nausea and vomiting after your treatment, we encourage you to take your nausea medication.   If you develop nausea and vomiting that is not controlled by your nausea medication, call the clinic.   BELOW ARE SYMPTOMS THAT SHOULD BE REPORTED IMMEDIATELY:  *FEVER GREATER THAN 100.5 F  *CHILLS WITH OR WITHOUT FEVER  NAUSEA AND VOMITING THAT IS NOT CONTROLLED WITH YOUR NAUSEA MEDICATION  *UNUSUAL SHORTNESS OF BREATH  *UNUSUAL BRUISING OR BLEEDING  TENDERNESS IN MOUTH AND THROAT WITH OR WITHOUT PRESENCE OF ULCERS  *URINARY PROBLEMS  *BOWEL PROBLEMS  UNUSUAL RASH Items with * indicate a potential emergency and should be followed up as soon as possible.  Feel free to call the clinic you have any questions or concerns. The clinic phone number is [phone].  Please show the CHEMO ALERT CARD at check-in to the Emergency Department and triage nurse.   

## 2014-09-20 ENCOUNTER — Telehealth: Payer: Self-pay | Admitting: Oncology

## 2014-09-20 NOTE — Telephone Encounter (Signed)
Patient called 5/19 inquiring about tx for 5/26 and 6/9. Per 3/31 pof I added these two tx dates and informed patient of times. Tx dates for 5/26 and 6/9 would have been added at 4/28 visit. No pof was sent at 4/28 visit. I noticed that patient's ct scan was scheduled for 7/5 and per 3/31 pof ct should be 6/7. Spoke with central and moving ct to 6/7. Called patient today and spoke with patient re new ct date/time/instructions.

## 2014-09-25 ENCOUNTER — Ambulatory Visit (HOSPITAL_BASED_OUTPATIENT_CLINIC_OR_DEPARTMENT_OTHER): Payer: Medicare Other

## 2014-09-25 ENCOUNTER — Other Ambulatory Visit (HOSPITAL_BASED_OUTPATIENT_CLINIC_OR_DEPARTMENT_OTHER): Payer: Medicare Other

## 2014-09-25 VITALS — BP 111/58 | HR 77 | Temp 97.9°F | Resp 18

## 2014-09-25 DIAGNOSIS — C801 Malignant (primary) neoplasm, unspecified: Secondary | ICD-10-CM | POA: Diagnosis present

## 2014-09-25 DIAGNOSIS — Z5111 Encounter for antineoplastic chemotherapy: Secondary | ICD-10-CM

## 2014-09-25 DIAGNOSIS — C221 Intrahepatic bile duct carcinoma: Secondary | ICD-10-CM

## 2014-09-25 DIAGNOSIS — C787 Secondary malignant neoplasm of liver and intrahepatic bile duct: Secondary | ICD-10-CM | POA: Diagnosis not present

## 2014-09-25 LAB — CBC WITH DIFFERENTIAL/PLATELET
BASO%: 0.3 % (ref 0.0–2.0)
BASOS ABS: 0 10*3/uL (ref 0.0–0.1)
EOS%: 1.6 % (ref 0.0–7.0)
Eosinophils Absolute: 0.2 10*3/uL (ref 0.0–0.5)
HEMATOCRIT: 35.6 % (ref 34.8–46.6)
HGB: 11.9 g/dL (ref 11.6–15.9)
LYMPH%: 13.2 % — ABNORMAL LOW (ref 14.0–49.7)
MCH: 34.4 pg — ABNORMAL HIGH (ref 25.1–34.0)
MCHC: 33.4 g/dL (ref 31.5–36.0)
MCV: 102.9 fL — ABNORMAL HIGH (ref 79.5–101.0)
MONO#: 0.9 10*3/uL (ref 0.1–0.9)
MONO%: 9.5 % (ref 0.0–14.0)
NEUT%: 75.4 % (ref 38.4–76.8)
NEUTROS ABS: 6.9 10*3/uL — AB (ref 1.5–6.5)
Platelets: 203 10*3/uL (ref 145–400)
RBC: 3.46 10*6/uL — ABNORMAL LOW (ref 3.70–5.45)
RDW: 16.8 % — AB (ref 11.2–14.5)
WBC: 9.2 10*3/uL (ref 3.9–10.3)
lymph#: 1.2 10*3/uL (ref 0.9–3.3)

## 2014-09-25 LAB — COMPREHENSIVE METABOLIC PANEL (CC13)
ALK PHOS: 98 U/L (ref 40–150)
ALT: 19 U/L (ref 0–55)
ANION GAP: 11 meq/L (ref 3–11)
AST: 24 U/L (ref 5–34)
Albumin: 3.8 g/dL (ref 3.5–5.0)
BUN: 15.1 mg/dL (ref 7.0–26.0)
CALCIUM: 9.5 mg/dL (ref 8.4–10.4)
CHLORIDE: 100 meq/L (ref 98–109)
CO2: 27 meq/L (ref 22–29)
Creatinine: 0.9 mg/dL (ref 0.6–1.1)
EGFR: 71 mL/min/{1.73_m2} — AB (ref >90)
GLUCOSE: 84 mg/dL (ref 70–140)
Potassium: 3.7 mEq/L (ref 3.5–5.1)
SODIUM: 138 meq/L (ref 136–145)
Total Bilirubin: 0.44 mg/dL (ref 0.20–1.20)
Total Protein: 7 g/dL (ref 6.4–8.3)

## 2014-09-25 MED ORDER — SODIUM CHLORIDE 0.9 % IJ SOLN
10.0000 mL | INTRAMUSCULAR | Status: DC | PRN
  Administered 2014-09-25: 10 mL
  Filled 2014-09-25: qty 10

## 2014-09-25 MED ORDER — SODIUM CHLORIDE 0.9 % IV SOLN
2000.0000 mg | Freq: Once | INTRAVENOUS | Status: AC
  Administered 2014-09-25: 2000 mg via INTRAVENOUS
  Filled 2014-09-25: qty 52.63

## 2014-09-25 MED ORDER — SODIUM CHLORIDE 0.9 % IV SOLN
Freq: Once | INTRAVENOUS | Status: AC
  Administered 2014-09-25: 15:00:00 via INTRAVENOUS

## 2014-09-25 MED ORDER — HEPARIN SOD (PORK) LOCK FLUSH 100 UNIT/ML IV SOLN
500.0000 [IU] | Freq: Once | INTRAVENOUS | Status: AC | PRN
  Administered 2014-09-25: 500 [IU]
  Filled 2014-09-25: qty 5

## 2014-09-25 MED ORDER — PROCHLORPERAZINE MALEATE 10 MG PO TABS
10.0000 mg | ORAL_TABLET | Freq: Once | ORAL | Status: AC
  Administered 2014-09-25: 10 mg via ORAL

## 2014-09-25 MED ORDER — PROCHLORPERAZINE MALEATE 10 MG PO TABS
ORAL_TABLET | ORAL | Status: AC
  Filled 2014-09-25: qty 1

## 2014-09-25 NOTE — Patient Instructions (Signed)
[facility] Discharge Instructions for Patients Receiving Chemotherapy  Today you received the following chemotherapy agents Gemzar  To help prevent nausea and vomiting after your treatment, we encourage you to take your nausea medication as prescribed   If you develop nausea and vomiting that is not controlled by your nausea medication, call the clinic.   BELOW ARE SYMPTOMS THAT SHOULD BE REPORTED IMMEDIATELY:  *FEVER GREATER THAN 100.5 F  *CHILLS WITH OR WITHOUT FEVER  NAUSEA AND VOMITING THAT IS NOT CONTROLLED WITH YOUR NAUSEA MEDICATION  *UNUSUAL SHORTNESS OF BREATH  *UNUSUAL BRUISING OR BLEEDING  TENDERNESS IN MOUTH AND THROAT WITH OR WITHOUT PRESENCE OF ULCERS  *URINARY PROBLEMS  *BOWEL PROBLEMS  UNUSUAL RASH Items with * indicate a potential emergency and should be followed up as soon as possible.  Feel free to call the clinic you have any questions or concerns. The clinic phone number is [phone].  Please show the CHEMO ALERT CARD at check-in to the Emergency Department and triage nurse.   

## 2014-10-07 ENCOUNTER — Encounter (HOSPITAL_COMMUNITY): Payer: Self-pay

## 2014-10-07 ENCOUNTER — Ambulatory Visit (HOSPITAL_COMMUNITY)
Admission: RE | Admit: 2014-10-07 | Discharge: 2014-10-07 | Disposition: A | Discharge status: Home / Self Care | Payer: Medicare Other | Source: Ambulatory Visit | Attending: Oncology | Admitting: Oncology

## 2014-10-07 DIAGNOSIS — R160 Hepatomegaly, not elsewhere classified: Secondary | ICD-10-CM | POA: Diagnosis not present

## 2014-10-07 DIAGNOSIS — C221 Intrahepatic bile duct carcinoma: Secondary | ICD-10-CM | POA: Diagnosis present

## 2014-10-07 MED ORDER — IOHEXOL 300 MG/ML  SOLN
100.0000 mL | Freq: Once | INTRAMUSCULAR | Status: AC | PRN
  Administered 2014-10-07: 100 mL via INTRAVENOUS

## 2014-10-09 ENCOUNTER — Other Ambulatory Visit (HOSPITAL_BASED_OUTPATIENT_CLINIC_OR_DEPARTMENT_OTHER): Payer: Medicare Other

## 2014-10-09 ENCOUNTER — Ambulatory Visit (HOSPITAL_BASED_OUTPATIENT_CLINIC_OR_DEPARTMENT_OTHER): Payer: Medicare Other | Admitting: Oncology

## 2014-10-09 ENCOUNTER — Other Ambulatory Visit: Payer: Self-pay | Admitting: *Deleted

## 2014-10-09 ENCOUNTER — Telehealth: Payer: Self-pay | Admitting: Oncology

## 2014-10-09 ENCOUNTER — Ambulatory Visit (HOSPITAL_BASED_OUTPATIENT_CLINIC_OR_DEPARTMENT_OTHER): Payer: Medicare Other

## 2014-10-09 VITALS — BP 133/65 | HR 73 | Temp 98.0°F | Resp 17 | Ht 66.0 in | Wt 173.3 lb

## 2014-10-09 DIAGNOSIS — C801 Malignant (primary) neoplasm, unspecified: Secondary | ICD-10-CM

## 2014-10-09 DIAGNOSIS — Z5111 Encounter for antineoplastic chemotherapy: Secondary | ICD-10-CM

## 2014-10-09 DIAGNOSIS — C221 Intrahepatic bile duct carcinoma: Secondary | ICD-10-CM

## 2014-10-09 DIAGNOSIS — C787 Secondary malignant neoplasm of liver and intrahepatic bile duct: Secondary | ICD-10-CM

## 2014-10-09 DIAGNOSIS — K5900 Constipation, unspecified: Secondary | ICD-10-CM | POA: Diagnosis not present

## 2014-10-09 DIAGNOSIS — C220 Liver cell carcinoma: Secondary | ICD-10-CM

## 2014-10-09 LAB — CBC WITH DIFFERENTIAL/PLATELET
BASO%: 0.5 % (ref 0.0–2.0)
Basophils Absolute: 0 10*3/uL (ref 0.0–0.1)
EOS ABS: 0.1 10*3/uL (ref 0.0–0.5)
EOS%: 1.6 % (ref 0.0–7.0)
HCT: 35.2 % (ref 34.8–46.6)
HGB: 11.8 g/dL (ref 11.6–15.9)
LYMPH%: 11.8 % — AB (ref 14.0–49.7)
MCH: 34.2 pg — ABNORMAL HIGH (ref 25.1–34.0)
MCHC: 33.4 g/dL (ref 31.5–36.0)
MCV: 102.4 fL — ABNORMAL HIGH (ref 79.5–101.0)
MONO#: 0.9 10*3/uL (ref 0.1–0.9)
MONO%: 9.6 % (ref 0.0–14.0)
NEUT#: 7.1 10*3/uL — ABNORMAL HIGH (ref 1.5–6.5)
NEUT%: 76.5 % (ref 38.4–76.8)
PLATELETS: 197 10*3/uL (ref 145–400)
RBC: 3.44 10*6/uL — ABNORMAL LOW (ref 3.70–5.45)
RDW: 18 % — ABNORMAL HIGH (ref 11.2–14.5)
WBC: 9.3 10*3/uL (ref 3.9–10.3)
lymph#: 1.1 10*3/uL (ref 0.9–3.3)

## 2014-10-09 LAB — COMPREHENSIVE METABOLIC PANEL (CC13)
ALBUMIN: 3.8 g/dL (ref 3.5–5.0)
ALT: 17 U/L (ref 0–55)
ANION GAP: 8 meq/L (ref 3–11)
AST: 23 U/L (ref 5–34)
Alkaline Phosphatase: 98 U/L (ref 40–150)
BUN: 13.6 mg/dL (ref 7.0–26.0)
CALCIUM: 10.1 mg/dL (ref 8.4–10.4)
CHLORIDE: 102 meq/L (ref 98–109)
CO2: 29 mEq/L (ref 22–29)
CREATININE: 0.9 mg/dL (ref 0.6–1.1)
EGFR: 65 mL/min/{1.73_m2} — ABNORMAL LOW (ref >90)
Glucose: 89 mg/dl (ref 70–140)
Potassium: 4 mEq/L (ref 3.5–5.1)
Sodium: 139 mEq/L (ref 136–145)
Total Bilirubin: 0.48 mg/dL (ref 0.20–1.20)
Total Protein: 6.9 g/dL (ref 6.4–8.3)

## 2014-10-09 MED ORDER — SODIUM CHLORIDE 0.9 % IV SOLN
2000.0000 mg | Freq: Once | INTRAVENOUS | Status: AC
  Administered 2014-10-09: 2000 mg via INTRAVENOUS
  Filled 2014-10-09: qty 52.6

## 2014-10-09 MED ORDER — POLYETHYLENE GLYCOL 3350 17 GM/SCOOP PO POWD: ORAL | Status: DC

## 2014-10-09 MED ORDER — SODIUM CHLORIDE 0.9 % IJ SOLN
10.0000 mL | INTRAMUSCULAR | Status: DC | PRN
  Administered 2014-10-09: 10 mL
  Filled 2014-10-09: qty 10

## 2014-10-09 MED ORDER — PROCHLORPERAZINE MALEATE 10 MG PO TABS
ORAL_TABLET | ORAL | Status: AC
  Filled 2014-10-09: qty 1

## 2014-10-09 MED ORDER — PROCHLORPERAZINE MALEATE 10 MG PO TABS
10.0000 mg | ORAL_TABLET | Freq: Once | ORAL | Status: AC
  Administered 2014-10-09: 10 mg via ORAL

## 2014-10-09 MED ORDER — ONDANSETRON HCL 8 MG PO TABS: 8.0000 mg | ORAL_TABLET | Freq: Three times a day (TID) | ORAL | Status: DC | PRN

## 2014-10-09 MED ORDER — SODIUM CHLORIDE 0.9 % IV SOLN
Freq: Once | INTRAVENOUS | Status: AC
  Administered 2014-10-09: 12:00:00 via INTRAVENOUS

## 2014-10-09 MED ORDER — HEPARIN SOD (PORK) LOCK FLUSH 100 UNIT/ML IV SOLN
500.0000 [IU] | Freq: Once | INTRAVENOUS | Status: AC | PRN
  Administered 2014-10-09: 500 [IU]
  Filled 2014-10-09: qty 5

## 2014-10-09 MED ORDER — HYDROCODONE-ACETAMINOPHEN 5-325 MG PO TABS: 1.0000 | ORAL_TABLET | Freq: Four times a day (QID) | ORAL | Status: DC | PRN

## 2014-10-09 NOTE — Progress Notes (Signed)
Hematology and Oncology Follow Up Visit  Lindsay Aguirre [id number] [birth year] 64 y.o. 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 This was confirmed by biopsy on 08/14/2013 and imaging studies showed multifocal hepatic lesions.   Prior Therapy: Status post a liver biopsy on 08/14/2013.  Current therapy: Gemcitabine at 1000 mg per meter squared started on 09/06/2013 given weekly 3 weeks on and one week off for 6 cycles.  She is currently receiving her chemotherapy every other week for better tolerance.  Interim History: Lindsay Aguirre presents for follow up with a friend. Since her last visit, she reports no new complaints. She has reported intermittent left-sided upper quadrant pain. Pain is dull in nature and does not require any medication. She reports occasional nausea controlled with just Zofran. She is tolerating chemotherapy with very few issues. She does report some arthralgias and myalgia, grade 1. She does not report any fatigue and she continues to be active.  She continues to have an excellent quality of life and able to attends activities of daily living.  She reports no fever, chills, shortness of breath chest pain, night sweats or diarrhea.  Has not reported any hematochezia or melena. Has not reported any decline in her energy level or performance status. She continues to perform her activities of daily living without any decline. She has not reported any syncope or neurological changes. She has not reported any arthralgias or myalgias. Did not report any skin rashes or lesions. Did not report any lymphadenopathy or petechiae. She had no infusion-related complications with chemotherapy.  Remainder of her review of system is unremarkable.  Medications: I have reviewed the patient's current medications. Unchanged by my  review. Current Outpatient Prescriptions  Medication Sig Dispense Refill  . allopurinol (ZYLOPRIM) 300 MG tablet TAKE 1 TABLET (300 MG TOTAL) BY MOUTH DAILY. 30 tablet 2  . bifidobacterium infantis (ALIGN) capsule Take 1 capsule by mouth daily.    [name] BIOTIN PO Take 1 tablet by mouth daily.    . Cholecalciferol (VITAMIN D) 2000 UNITS tablet Take 2,000 Units by mouth daily.    [name] dextromethorphan-guaiFENesin (MUCINEX DM) 30-600 MG per 12 hr tablet Take 1 tablet by mouth 2 (two) times daily. 28 tablet 0  . Docusate Sodium (COLACE PO) Take 3 capsules by mouth daily.    [name] HYDROcodone-acetaminophen (NORCO/VICODIN) 5-325 MG per tablet Take 1 tablet by mouth every 6 (six) hours as needed for moderate pain. 60 tablet 0  . ibuprofen (ADVIL,MOTRIN) 800 MG tablet   1  . lidocaine-prilocaine (EMLA) cream Apply 1 application topically as needed. Apply cream, on days of treatment, to [facility] site 1-2 hours prior to treatment. 30 g 0  . lisinopril-hydrochlorothiazide (PRINZIDE,ZESTORETIC) 20-12.5 MG per tablet Take 1 tablet by mouth every evening. 30 tablet 5  . meloxicam (MOBIC) 15 MG tablet Take 1 tablet (15 mg total) by mouth daily. 30 tablet 3  . ondansetron (ZOFRAN) 8 MG tablet Take 1 tablet (8 mg total) by mouth every 8 (eight) hours as needed for nausea or vomiting. 30 tablet 1  . polyethylene glycol powder (GLYCOLAX/MIRALAX) powder TAKE 1 CONTAINER TOTAL ONCE AS DIRECTED 255 g 1  . prochlorperazine (COMPAZINE) 10 MG tablet Take 1 tablet (10 mg total) by mouth every 6 (six) hours as needed for nausea or vomiting. 30 tablet 1  . senna (SENOKOT) 8.6 MG TABS tablet Take 2 tablets (17.2  mg total) by mouth daily. 60 each 0  . SYNTHROID 88 MCG tablet TAKE 1 TABLET (88 MCG TOTAL) BY MOUTH DAILY BEFORE BREAKFAST. 30 tablet 5  . vitamin C (ASCORBIC ACID) 500 MG tablet Take 500 mg by mouth daily.     No current facility-administered medications for this visit.     Allergies:  Allergies  Allergen Reactions  .  Livalo [Pitavastatin]     Leg cramps  . Simvastatin     Leg cramps  . Statins     Leg Cramps     Past Medical History, Surgical history, Social history, and Family History were reviewed and updated.  Physical Exam: Blood pressure 133/65, pulse 73, temperature 98 F (36.7 C), temperature source Oral, resp. rate 17, height 5\' 6"  (1.676 m), weight 173 lb 4.8 oz (78.608 kg), SpO2 99 %. ECOG: 0 General appearance: alert and cooperative not in any distress. Head: Normocephalic, without obvious abnormality Neck: no adenopathy Lymph nodes: Cervical, supraclavicular, and axillary nodes normal. Heart:regular rate and rhythm, S1, S2.  Lung:chest clear, no wheezing. Dullness to percussion. Abdomin: soft, non-tender, without masses or organomegaly no rebound or guarding. EXT:no erythema, induration, or edema Skin: No rashes or lesions. Neurologically: intact.   Lab Results: Lab Results  Component Value Date   WBC 9.3 10/09/2014   HGB 11.8 10/09/2014   HCT 35.2 10/09/2014   MCV 102.4* 10/09/2014   PLT 197 10/09/2014     Chemistry      Component Value Date/Time   NA 138 09/25/2014 1359   NA 141 07/10/2014 1306   NA 140 01/16/2014 1302   K 3.7 09/25/2014 1359   K 4.1 07/10/2014 1306   CL 98 07/10/2014 1306   CO2 27 09/25/2014 1359   CO2 24 07/10/2014 1306   BUN 15.1 09/25/2014 1359   BUN 16 07/10/2014 1306   BUN 10 01/16/2014 1302   CREATININE 0.9 09/25/2014 1359   CREATININE 0.79 07/10/2014 1306   CREATININE 0.81 10/24/2012 1203      Component Value Date/Time   CALCIUM 9.5 09/25/2014 1359   CALCIUM 10.1 07/10/2014 1306   ALKPHOS 98 09/25/2014 1359   ALKPHOS 83 07/10/2014 1306   AST 24 09/25/2014 1359   AST 35 07/10/2014 1306   ALT 19 09/25/2014 1359   ALT 32 07/10/2014 1306   BILITOT 0.44 09/25/2014 1359   BILITOT 0.4 07/10/2014 1306   BILITOT 0.5 01/16/2014 1302      EXAM: CT ABDOMEN AND PELVIS WITH CONTRAST  TECHNIQUE: Multidetector CT imaging of the abdomen  and pelvis was performed using the standard protocol following bolus administration of intravenous contrast.  CONTRAST: 169mL OMNIPAQUE IOHEXOL 300 MG/ML SOLN  COMPARISON: Multiple exams, including 06/03/2014  FINDINGS: Lower chest: Stable 5 mm left lower lobe nodule, image 6 series 7.  Hepatobiliary: Enlarging ring-enhancing masses in the liver. Index lateral segment left hepatic lobe mass on image 19 series 2 measures 2.9 by 2.2 cm, previously 2.4 by 1.9 cm. A new peripheral subcapsular mass in the right hepatic lobe on image 34 series 2 measures 2.1 by 1.2 cm. Posteriorly in the right hepatic lobe a centrally necrotic 3.2 by 2.8 cm mass previously measured approximately 1.0 by 1.1 cm. The large geographic region of tumor inferiorly in the left hepatic lobe and extending into the right hepatic lobe appears grossly similar.  Pancreas: Unremarkable  Spleen: Unremarkable  Adrenals/Urinary Tract: Unremarkable  Stomach/Bowel: Unremarkable  Vascular/Lymphatic: Splenorenal shunting. Prominent left gonadal vein. Aortoiliac atherosclerotic vascular disease. Porta hepatis lymph  node 1.2 cm in short axis on image 26 series 4, formerly the same.  Reproductive: Hypodense left ovarian cystic lesion 3.8 by 2.7 cm, formerly 4.3 by 3.3 cm.  Other: Small amount of free pelvic fluid, primarily in the cul-de-sac, new from prior.  Musculoskeletal: Lower lumbar posterior decompression with posterolateral rod and pedicle screw fixation at L3-L4-L5. Degenerative grade 1 retrolisthesis at L2-3 which in conjunction with facet overgrowth causes considerable bilateral foraminal stenosis.  IMPRESSION: 1. New and enlarging hepatic masses compatible with progressive malignancy. 2. New small amount of free pelvic fluid. 3. Otherwise stable.    64 year old woman with the following issues:  1. Multifocal hepatic metastasis of an adenocarcinoma llikely represents  pancreaticobiliary origin. She is on chemotherapy with gemcitabine 1000 mg day 1 and 15 of a 28 day cycle.    CT scan from 10/07/2014 was reviewed today and showed mild progression of her disease. Given options were discussed today with the patient extensively. Different salvage regimens including oxaliplatin, 5-FU, Xeloda among others were reviewed. Risks and benefits of all these options were discussed but she elected to stay with gemcitabine. She feels that her quality of life is the most important aspect and she feels that she has a reasonable quality of life at this time would like to keep it as it is. Despite the slight progression I think is reasonable to continue on the current dose and regimen as its palliating her overall symptoms. If she develops clinical decline in the future, we will consider different salvage regimen.  2. IV access: Port-A-Cath in place without any complications.   3. Antiemetics: Continue Zofran as needed.   4. Constipation: She uses MiraLax with excellent response.  5. Followup: Every 2 weeks for chemotherapy with a clinical visit in 4 weeks.  [facility], MD 6/9/201611:51 AM

## 2014-10-09 NOTE — Patient Instructions (Signed)
[facility] Discharge Instructions for Patients Receiving Chemotherapy  Today you received the following chemotherapy agents Gemzar.  To help prevent nausea and vomiting after your treatment, we encourage you to take your nausea medication.   If you develop nausea and vomiting that is not controlled by your nausea medication, call the clinic.   BELOW ARE SYMPTOMS THAT SHOULD BE REPORTED IMMEDIATELY:  *FEVER GREATER THAN 100.5 F  *CHILLS WITH OR WITHOUT FEVER  NAUSEA AND VOMITING THAT IS NOT CONTROLLED WITH YOUR NAUSEA MEDICATION  *UNUSUAL SHORTNESS OF BREATH  *UNUSUAL BRUISING OR BLEEDING  TENDERNESS IN MOUTH AND THROAT WITH OR WITHOUT PRESENCE OF ULCERS  *URINARY PROBLEMS  *BOWEL PROBLEMS  UNUSUAL RASH Items with * indicate a potential emergency and should be followed up as soon as possible.  Feel free to call the clinic you have any questions or concerns. The clinic phone number is [phone].  Please show the CHEMO ALERT CARD at check-in to the Emergency Department and triage nurse.   

## 2014-10-09 NOTE — Telephone Encounter (Signed)
per pof to sch pt appt-sent MW email to sch pt trmt-printed avs for pt

## 2014-10-13 ENCOUNTER — Other Ambulatory Visit: Payer: Self-pay | Admitting: Physician Assistant

## 2014-10-13 MED ORDER — ONDANSETRON HCL 8 MG PO TABS: 8.0000 mg | ORAL_TABLET | Freq: Three times a day (TID) | ORAL | Status: DC | PRN

## 2014-10-22 ENCOUNTER — Other Ambulatory Visit: Payer: Medicare Other

## 2014-10-23 ENCOUNTER — Ambulatory Visit (HOSPITAL_BASED_OUTPATIENT_CLINIC_OR_DEPARTMENT_OTHER): Payer: Medicare Other

## 2014-10-23 ENCOUNTER — Other Ambulatory Visit (HOSPITAL_BASED_OUTPATIENT_CLINIC_OR_DEPARTMENT_OTHER): Payer: Medicare Other

## 2014-10-23 VITALS — BP 107/59 | HR 72 | Temp 98.6°F | Resp 18

## 2014-10-23 DIAGNOSIS — C801 Malignant (primary) neoplasm, unspecified: Secondary | ICD-10-CM

## 2014-10-23 DIAGNOSIS — Z5111 Encounter for antineoplastic chemotherapy: Secondary | ICD-10-CM

## 2014-10-23 DIAGNOSIS — C787 Secondary malignant neoplasm of liver and intrahepatic bile duct: Secondary | ICD-10-CM

## 2014-10-23 DIAGNOSIS — C221 Intrahepatic bile duct carcinoma: Secondary | ICD-10-CM

## 2014-10-23 LAB — COMPREHENSIVE METABOLIC PANEL (CC13)
ALK PHOS: 96 U/L (ref 40–150)
ALT: 18 U/L (ref 0–55)
ANION GAP: 7 meq/L (ref 3–11)
AST: 25 U/L (ref 5–34)
Albumin: 3.7 g/dL (ref 3.5–5.0)
BILIRUBIN TOTAL: 0.5 mg/dL (ref 0.20–1.20)
BUN: 13.2 mg/dL (ref 7.0–26.0)
CO2: 29 mEq/L (ref 22–29)
Calcium: 9.8 mg/dL (ref 8.4–10.4)
Chloride: 104 mEq/L (ref 98–109)
Creatinine: 0.9 mg/dL (ref 0.6–1.1)
EGFR: 71 mL/min/{1.73_m2} — AB (ref >90)
GLUCOSE: 107 mg/dL (ref 70–140)
Potassium: 3.8 mEq/L (ref 3.5–5.1)
Sodium: 139 mEq/L (ref 136–145)
Total Protein: 6.7 g/dL (ref 6.4–8.3)

## 2014-10-23 LAB — CBC WITH DIFFERENTIAL/PLATELET
BASO%: 0.2 % (ref 0.0–2.0)
BASOS ABS: 0 10*3/uL (ref 0.0–0.1)
EOS%: 1.4 % (ref 0.0–7.0)
Eosinophils Absolute: 0.1 10*3/uL (ref 0.0–0.5)
HEMATOCRIT: 34.1 % — AB (ref 34.8–46.6)
HEMOGLOBIN: 11.4 g/dL — AB (ref 11.6–15.9)
LYMPH#: 1 10*3/uL (ref 0.9–3.3)
LYMPH%: 11.5 % — ABNORMAL LOW (ref 14.0–49.7)
MCH: 34.3 pg — AB (ref 25.1–34.0)
MCHC: 33.4 g/dL (ref 31.5–36.0)
MCV: 102.7 fL — AB (ref 79.5–101.0)
MONO#: 0.7 10*3/uL (ref 0.1–0.9)
MONO%: 8.2 % (ref 0.0–14.0)
NEUT#: 6.9 10*3/uL — ABNORMAL HIGH (ref 1.5–6.5)
NEUT%: 78.7 % — AB (ref 38.4–76.8)
Platelets: 195 10*3/uL (ref 145–400)
RBC: 3.32 10*6/uL — ABNORMAL LOW (ref 3.70–5.45)
RDW: 17 % — ABNORMAL HIGH (ref 11.2–14.5)
WBC: 8.8 10*3/uL (ref 3.9–10.3)

## 2014-10-23 MED ORDER — SODIUM CHLORIDE 0.9 % IV SOLN
2000.0000 mg | Freq: Once | INTRAVENOUS | Status: AC
  Administered 2014-10-23: 2000 mg via INTRAVENOUS
  Filled 2014-10-23: qty 52.63

## 2014-10-23 MED ORDER — HEPARIN SOD (PORK) LOCK FLUSH 100 UNIT/ML IV SOLN
500.0000 [IU] | Freq: Once | INTRAVENOUS | Status: AC | PRN
  Administered 2014-10-23: 500 [IU]
  Filled 2014-10-23: qty 5

## 2014-10-23 MED ORDER — PROCHLORPERAZINE MALEATE 10 MG PO TABS
10.0000 mg | ORAL_TABLET | Freq: Once | ORAL | Status: AC
  Administered 2014-10-23: 10 mg via ORAL

## 2014-10-23 MED ORDER — SODIUM CHLORIDE 0.9 % IJ SOLN
10.0000 mL | INTRAMUSCULAR | Status: DC | PRN
  Administered 2014-10-23: 10 mL
  Filled 2014-10-23: qty 10

## 2014-10-23 MED ORDER — PROCHLORPERAZINE MALEATE 10 MG PO TABS
ORAL_TABLET | ORAL | Status: AC
  Filled 2014-10-23: qty 1

## 2014-10-23 MED ORDER — SODIUM CHLORIDE 0.9 % IV SOLN
Freq: Once | INTRAVENOUS | Status: AC
  Administered 2014-10-23: 13:00:00 via INTRAVENOUS

## 2014-10-23 NOTE — Patient Instructions (Signed)
[facility] Discharge Instructions for Patients Receiving Chemotherapy  Today you received the following chemotherapy agents: Gemzar  To help prevent nausea and vomiting after your treatment, we encourage you to take your nausea medication: Compazine. Take one every 6 hours as needed.  If you develop nausea and vomiting that is not controlled by your nausea medication, call the clinic.   BELOW ARE SYMPTOMS THAT SHOULD BE REPORTED IMMEDIATELY:  *FEVER GREATER THAN 100.5 F  *CHILLS WITH OR WITHOUT FEVER  NAUSEA AND VOMITING THAT IS NOT CONTROLLED WITH YOUR NAUSEA MEDICATION  *UNUSUAL SHORTNESS OF BREATH  *UNUSUAL BRUISING OR BLEEDING  TENDERNESS IN MOUTH AND THROAT WITH OR WITHOUT PRESENCE OF ULCERS  *URINARY PROBLEMS  *BOWEL PROBLEMS  UNUSUAL RASH Items with * indicate a potential emergency and should be followed up as soon as possible.  Feel free to call the clinic should you have any questions or concerns. The clinic phone number is [phone].  Please show the CHEMO ALERT CARD at check-in to the Emergency Department and triage nurse.   

## 2014-10-31 ENCOUNTER — Ambulatory Visit (HOSPITAL_COMMUNITY): Payer: Medicare Other

## 2014-11-04 ENCOUNTER — Ambulatory Visit (HOSPITAL_COMMUNITY): Payer: Medicare Other

## 2014-11-06 ENCOUNTER — Other Ambulatory Visit (HOSPITAL_BASED_OUTPATIENT_CLINIC_OR_DEPARTMENT_OTHER): Payer: Medicare Other

## 2014-11-06 ENCOUNTER — Telehealth: Payer: Self-pay | Admitting: *Deleted

## 2014-11-06 ENCOUNTER — Ambulatory Visit (HOSPITAL_BASED_OUTPATIENT_CLINIC_OR_DEPARTMENT_OTHER): Payer: Medicare Other | Admitting: Nurse Practitioner

## 2014-11-06 ENCOUNTER — Ambulatory Visit (HOSPITAL_BASED_OUTPATIENT_CLINIC_OR_DEPARTMENT_OTHER): Payer: Medicare Other

## 2014-11-06 VITALS — BP 113/55 | HR 69 | Temp 98.5°F | Resp 18 | Ht 66.0 in | Wt 173.2 lb

## 2014-11-06 DIAGNOSIS — C787 Secondary malignant neoplasm of liver and intrahepatic bile duct: Secondary | ICD-10-CM

## 2014-11-06 DIAGNOSIS — Z5111 Encounter for antineoplastic chemotherapy: Secondary | ICD-10-CM | POA: Diagnosis present

## 2014-11-06 DIAGNOSIS — C221 Intrahepatic bile duct carcinoma: Secondary | ICD-10-CM

## 2014-11-06 DIAGNOSIS — C801 Malignant (primary) neoplasm, unspecified: Secondary | ICD-10-CM | POA: Diagnosis present

## 2014-11-06 DIAGNOSIS — K5900 Constipation, unspecified: Secondary | ICD-10-CM

## 2014-11-06 LAB — CBC WITH DIFFERENTIAL/PLATELET
BASO%: 0.3 % (ref 0.0–2.0)
Basophils Absolute: 0 10*3/uL (ref 0.0–0.1)
EOS%: 2.1 % (ref 0.0–7.0)
Eosinophils Absolute: 0.2 10*3/uL (ref 0.0–0.5)
HCT: 34.9 % (ref 34.8–46.6)
HGB: 11.7 g/dL (ref 11.6–15.9)
LYMPH#: 1.1 10*3/uL (ref 0.9–3.3)
LYMPH%: 10.8 % — AB (ref 14.0–49.7)
MCH: 34.5 pg — ABNORMAL HIGH (ref 25.1–34.0)
MCHC: 33.5 g/dL (ref 31.5–36.0)
MCV: 102.9 fL — ABNORMAL HIGH (ref 79.5–101.0)
MONO#: 0.8 10*3/uL (ref 0.1–0.9)
MONO%: 8 % (ref 0.0–14.0)
NEUT%: 78.8 % — ABNORMAL HIGH (ref 38.4–76.8)
NEUTROS ABS: 7.9 10*3/uL — AB (ref 1.5–6.5)
PLATELETS: 199 10*3/uL (ref 145–400)
RBC: 3.39 10*6/uL — AB (ref 3.70–5.45)
RDW: 17.5 % — AB (ref 11.2–14.5)
WBC: 10 10*3/uL (ref 3.9–10.3)

## 2014-11-06 LAB — COMPREHENSIVE METABOLIC PANEL (CC13)
ALK PHOS: 103 U/L (ref 40–150)
ALT: 23 U/L (ref 0–55)
AST: 27 U/L (ref 5–34)
Albumin: 3.7 g/dL (ref 3.5–5.0)
Anion Gap: 9 mEq/L (ref 3–11)
BUN: 13.1 mg/dL (ref 7.0–26.0)
CO2: 25 mEq/L (ref 22–29)
Calcium: 10.1 mg/dL (ref 8.4–10.4)
Chloride: 104 mEq/L (ref 98–109)
Creatinine: 0.8 mg/dL (ref 0.6–1.1)
EGFR: 75 mL/min/{1.73_m2} — ABNORMAL LOW (ref >90)
Glucose: 103 mg/dl (ref 70–140)
POTASSIUM: 3.9 meq/L (ref 3.5–5.1)
SODIUM: 138 meq/L (ref 136–145)
TOTAL PROTEIN: 6.8 g/dL (ref 6.4–8.3)
Total Bilirubin: 0.53 mg/dL (ref 0.20–1.20)

## 2014-11-06 MED ORDER — PROCHLORPERAZINE MALEATE 10 MG PO TABS
10.0000 mg | ORAL_TABLET | Freq: Once | ORAL | Status: AC
  Administered 2014-11-06: 10 mg via ORAL

## 2014-11-06 MED ORDER — SODIUM CHLORIDE 0.9 % IV SOLN
Freq: Once | INTRAVENOUS | Status: AC
  Administered 2014-11-06: 11:00:00 via INTRAVENOUS

## 2014-11-06 MED ORDER — SODIUM CHLORIDE 0.9 % IV SOLN
2000.0000 mg | Freq: Once | INTRAVENOUS | Status: AC
  Administered 2014-11-06: 2000 mg via INTRAVENOUS
  Filled 2014-11-06: qty 52.6

## 2014-11-06 MED ORDER — SODIUM CHLORIDE 0.9 % IJ SOLN
10.0000 mL | INTRAMUSCULAR | Status: DC | PRN
  Administered 2014-11-06: 10 mL
  Filled 2014-11-06: qty 10

## 2014-11-06 MED ORDER — HEPARIN SOD (PORK) LOCK FLUSH 100 UNIT/ML IV SOLN
500.0000 [IU] | Freq: Once | INTRAVENOUS | Status: AC | PRN
  Administered 2014-11-06: 500 [IU]
  Filled 2014-11-06: qty 5

## 2014-11-06 MED ORDER — PROCHLORPERAZINE MALEATE 10 MG PO TABS
ORAL_TABLET | ORAL | Status: AC
  Filled 2014-11-06: qty 1

## 2014-11-06 NOTE — Patient Instructions (Signed)
[facility] Discharge Instructions for Patients Receiving Chemotherapy  Today you received the following chemotherapy agents GEMZAR  To help prevent nausea and vomiting after your treatment, we encourage you to take your nausea medication if needed.   If you develop nausea and vomiting that is not controlled by your nausea medication, call the clinic.   BELOW ARE SYMPTOMS THAT SHOULD BE REPORTED IMMEDIATELY:  *FEVER GREATER THAN 100.5 F  *CHILLS WITH OR WITHOUT FEVER  NAUSEA AND VOMITING THAT IS NOT CONTROLLED WITH YOUR NAUSEA MEDICATION  *UNUSUAL SHORTNESS OF BREATH  *UNUSUAL BRUISING OR BLEEDING  TENDERNESS IN MOUTH AND THROAT WITH OR WITHOUT PRESENCE OF ULCERS  *URINARY PROBLEMS  *BOWEL PROBLEMS  UNUSUAL RASH Items with * indicate a potential emergency and should be followed up as soon as possible.  Feel free to call the clinic you have any questions or concerns. The clinic phone number is [phone].  Please show the CHEMO ALERT CARD at check-in to the Emergency Department and triage nurse.   

## 2014-11-06 NOTE — Progress Notes (Signed)
  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 This was confirmed by biopsy on 08/14/2013 and imaging studies showed multifocal hepatic lesions.   Prior Therapy: Status post a liver biopsy on 08/14/2013.  Current therapy: Gemcitabine at 1000 mg per meter squared started on 09/06/2013 given weekly 3 weeks on and one week off for 6 cycles.  She is currently receiving her chemotherapy every other week for better tolerance.   INTERVAL HISTORY:   Ms. Schnieders returns as scheduled. She continues gemcitabine every other week. She overall feels well. No significant nausea/vomiting following the most recent chemotherapy. No mouth sores. No diarrhea. She denies any fever. No rash. No shortness of breath or cough. She describes her appetite as "fair". She denies pain.  Objective:  Vital signs in last 24 hours:  Blood pressure 113/55, pulse 69, temperature 98.5 F (36.9 C), temperature source Oral, resp. rate 18, height 5\' 6"  (1.676 m), weight 173 lb 3.2 oz (78.563 kg), SpO2 98 %.    HEENT: No thrush or ulcers. Lymphatics: No palpable cervical or supraclavicular lymph nodes. Resp: Lungs clear bilaterally. Cardio: Regular rate and rhythm. GI: Abdomen soft and nontender. No hepatomegaly. Vascular: No leg edema. Calves soft and nontender. Skin: No rash.  Port-A-Cath without erythema.   Lab Results:  Lab Results  Component Value Date   WBC 10.0 11/06/2014   HGB 11.7 11/06/2014   HCT 34.9 11/06/2014   MCV 102.9* 11/06/2014   PLT 199 11/06/2014   NEUTROABS 7.9* 11/06/2014    Imaging:  No results found.  Medications: I have reviewed the patient's current medications.  Assessment/Plan: 1. Multifocal hepatic metastasis, adenocarcinoma, likely represents pancreaticobiliary origin. She is on chemotherapy with gemcitabine 1000 mg/m2 every other week.    2. IV access: Port-A-Cath in place without any complications.  3. Antiemetics: Continue Zofran as needed.   4. Constipation: She uses MiraLax as needed.  5. Followup: Every 2 weeks for chemotherapy with a clinical visit in 4 weeks.   Disposition: Ms. Tuch appears stable. She continues every other week gemcitabine. Plan to proceed with treatment today as scheduled. She will return for a follow-up visit in 4 weeks. She will contact the office in the interim with any problems.    Ned Card ANP/GNP-BC   11/06/2014  10:45 AM

## 2014-11-06 NOTE — Telephone Encounter (Signed)
Per staff message and POF I have scheduled appts. Advised scheduler of appts. JMW  

## 2014-11-07 ENCOUNTER — Encounter: Payer: Self-pay | Admitting: Physician Assistant

## 2014-11-07 ENCOUNTER — Ambulatory Visit (INDEPENDENT_AMBULATORY_CARE_PROVIDER_SITE_OTHER): Payer: Medicare Other | Admitting: Physician Assistant

## 2014-11-07 ENCOUNTER — Telehealth: Payer: Self-pay | Admitting: Nurse Practitioner

## 2014-11-07 VITALS — BP 123/68 | HR 89 | Temp 97.5°F | Ht 66.0 in | Wt 172.4 lb

## 2014-11-07 DIAGNOSIS — M25511 Pain in right shoulder: Secondary | ICD-10-CM | POA: Diagnosis not present

## 2014-11-07 NOTE — Patient Instructions (Signed)

## 2014-11-07 NOTE — Telephone Encounter (Signed)
Appointment given for today at 1:10 with [place].

## 2014-11-07 NOTE — Progress Notes (Signed)
Subjective:     Patient ID: Lindsay Aguirre, female   DOB: 12/28/1950, 64 y.o.   MRN: [id number]  HPI Pt here with R shoulder blade pain She denies any injury to the area Sx worse with change in position Hx of same to the L shoulder Some radiation of sx down the R arm No numbness She is currently in chemotherapy   Review of Systems     Objective:   Physical Exam No ecchy, edema , or rash seen to the R scapula FROM of the R shoulder w/o sx No sx with rest abduction laterally or anteriorly + TTP R scapula superiorly FROM of the C-spine with sl sx Shoulder shrug equal again with some sx    Assessment:     R scapular/trap pain    Plan:     Concern discussed about the possibility of early shingle presentation Pt has pain meds at home Flexeril 10mg  1 po tid prn #21- SE reviewed Pt to return immed if rash appears

## 2014-11-20 ENCOUNTER — Ambulatory Visit: Payer: Medicare Other | Admitting: Oncology

## 2014-11-20 ENCOUNTER — Other Ambulatory Visit (HOSPITAL_BASED_OUTPATIENT_CLINIC_OR_DEPARTMENT_OTHER): Payer: Medicare Other

## 2014-11-20 ENCOUNTER — Ambulatory Visit (HOSPITAL_BASED_OUTPATIENT_CLINIC_OR_DEPARTMENT_OTHER): Payer: Medicare Other

## 2014-11-20 VITALS — BP 121/66 | HR 71 | Temp 98.2°F | Resp 18

## 2014-11-20 DIAGNOSIS — C221 Intrahepatic bile duct carcinoma: Secondary | ICD-10-CM

## 2014-11-20 DIAGNOSIS — C801 Malignant (primary) neoplasm, unspecified: Secondary | ICD-10-CM

## 2014-11-20 DIAGNOSIS — C787 Secondary malignant neoplasm of liver and intrahepatic bile duct: Secondary | ICD-10-CM | POA: Diagnosis not present

## 2014-11-20 DIAGNOSIS — Z5111 Encounter for antineoplastic chemotherapy: Secondary | ICD-10-CM | POA: Diagnosis present

## 2014-11-20 LAB — CBC WITH DIFFERENTIAL/PLATELET
BASO%: 0.5 % (ref 0.0–2.0)
Basophils Absolute: 0.1 10*3/uL (ref 0.0–0.1)
EOS%: 0.9 % (ref 0.0–7.0)
Eosinophils Absolute: 0.1 10*3/uL (ref 0.0–0.5)
HCT: 36.5 % (ref 34.8–46.6)
HGB: 12.3 g/dL (ref 11.6–15.9)
LYMPH#: 1.2 10*3/uL (ref 0.9–3.3)
LYMPH%: 7.2 % — ABNORMAL LOW (ref 14.0–49.7)
MCH: 34.5 pg — AB (ref 25.1–34.0)
MCHC: 33.7 g/dL (ref 31.5–36.0)
MCV: 102.3 fL — AB (ref 79.5–101.0)
MONO#: 1.5 10*3/uL — ABNORMAL HIGH (ref 0.1–0.9)
MONO%: 9.1 % (ref 0.0–14.0)
NEUT#: 13.6 10*3/uL — ABNORMAL HIGH (ref 1.5–6.5)
NEUT%: 82.3 % — ABNORMAL HIGH (ref 38.4–76.8)
PLATELETS: 239 10*3/uL (ref 145–400)
RBC: 3.57 10*6/uL — AB (ref 3.70–5.45)
RDW: 18.2 % — ABNORMAL HIGH (ref 11.2–14.5)
WBC: 16.6 10*3/uL — ABNORMAL HIGH (ref 3.9–10.3)

## 2014-11-20 LAB — COMPREHENSIVE METABOLIC PANEL (CC13)
ALT: 23 U/L (ref 0–55)
AST: 24 U/L (ref 5–34)
Albumin: 3.8 g/dL (ref 3.5–5.0)
Alkaline Phosphatase: 100 U/L (ref 40–150)
Anion Gap: 7 mEq/L (ref 3–11)
BUN: 15.7 mg/dL (ref 7.0–26.0)
CALCIUM: 9.9 mg/dL (ref 8.4–10.4)
CO2: 26 mEq/L (ref 22–29)
CREATININE: 1 mg/dL (ref 0.6–1.1)
Chloride: 106 mEq/L (ref 98–109)
EGFR: 63 mL/min/{1.73_m2} — AB (ref >90)
Glucose: 91 mg/dl (ref 70–140)
Potassium: 3.7 mEq/L (ref 3.5–5.1)
Sodium: 139 mEq/L (ref 136–145)
Total Bilirubin: 0.59 mg/dL (ref 0.20–1.20)
Total Protein: 7 g/dL (ref 6.4–8.3)

## 2014-11-20 MED ORDER — PROCHLORPERAZINE MALEATE 10 MG PO TABS
ORAL_TABLET | ORAL | Status: AC
  Filled 2014-11-20: qty 1

## 2014-11-20 MED ORDER — SODIUM CHLORIDE 0.9 % IV SOLN
2000.0000 mg | Freq: Once | INTRAVENOUS | Status: AC
  Administered 2014-11-20: 2000 mg via INTRAVENOUS
  Filled 2014-11-20: qty 52.6

## 2014-11-20 MED ORDER — HEPARIN SOD (PORK) LOCK FLUSH 100 UNIT/ML IV SOLN
500.0000 [IU] | Freq: Once | INTRAVENOUS | Status: AC | PRN
  Administered 2014-11-20: 500 [IU]
  Filled 2014-11-20: qty 5

## 2014-11-20 MED ORDER — PROCHLORPERAZINE MALEATE 10 MG PO TABS
10.0000 mg | ORAL_TABLET | Freq: Once | ORAL | Status: AC
  Administered 2014-11-20: 10 mg via ORAL

## 2014-11-20 MED ORDER — SODIUM CHLORIDE 0.9 % IV SOLN
Freq: Once | INTRAVENOUS | Status: AC
  Administered 2014-11-20: 09:00:00 via INTRAVENOUS

## 2014-11-20 MED ORDER — SODIUM CHLORIDE 0.9 % IJ SOLN
10.0000 mL | INTRAMUSCULAR | Status: DC | PRN
  Administered 2014-11-20: 10 mL
  Filled 2014-11-20: qty 10

## 2014-11-20 NOTE — Patient Instructions (Signed)
[facility] Discharge Instructions for Patients Receiving Chemotherapy  Today you received the following chemotherapy agents Gemzar  To help prevent nausea and vomiting after your treatment, we encourage you to take your nausea medication as prescribed   If you develop nausea and vomiting that is not controlled by your nausea medication, call the clinic.   BELOW ARE SYMPTOMS THAT SHOULD BE REPORTED IMMEDIATELY:  *FEVER GREATER THAN 100.5 F  *CHILLS WITH OR WITHOUT FEVER  NAUSEA AND VOMITING THAT IS NOT CONTROLLED WITH YOUR NAUSEA MEDICATION  *UNUSUAL SHORTNESS OF BREATH  *UNUSUAL BRUISING OR BLEEDING  TENDERNESS IN MOUTH AND THROAT WITH OR WITHOUT PRESENCE OF ULCERS  *URINARY PROBLEMS  *BOWEL PROBLEMS  UNUSUAL RASH Items with * indicate a potential emergency and should be followed up as soon as possible.  Feel free to call the clinic you have any questions or concerns. The clinic phone number is [phone].  Please show the CHEMO ALERT CARD at check-in to the Emergency Department and triage nurse.   

## 2014-11-26 ENCOUNTER — Other Ambulatory Visit: Payer: Self-pay | Admitting: Orthopedic Surgery

## 2014-11-26 DIAGNOSIS — M542 Cervicalgia: Secondary | ICD-10-CM

## 2014-12-04 ENCOUNTER — Ambulatory Visit (HOSPITAL_BASED_OUTPATIENT_CLINIC_OR_DEPARTMENT_OTHER): Payer: Medicare Other | Admitting: Oncology

## 2014-12-04 ENCOUNTER — Telehealth: Payer: Self-pay | Admitting: Oncology

## 2014-12-04 ENCOUNTER — Ambulatory Visit (HOSPITAL_BASED_OUTPATIENT_CLINIC_OR_DEPARTMENT_OTHER): Payer: Medicare Other

## 2014-12-04 ENCOUNTER — Other Ambulatory Visit (HOSPITAL_BASED_OUTPATIENT_CLINIC_OR_DEPARTMENT_OTHER): Payer: Medicare Other

## 2014-12-04 VITALS — BP 125/65 | HR 83 | Temp 98.5°F | Resp 18 | Ht 66.0 in | Wt 174.6 lb

## 2014-12-04 DIAGNOSIS — C787 Secondary malignant neoplasm of liver and intrahepatic bile duct: Secondary | ICD-10-CM

## 2014-12-04 DIAGNOSIS — C801 Malignant (primary) neoplasm, unspecified: Secondary | ICD-10-CM | POA: Diagnosis present

## 2014-12-04 DIAGNOSIS — Z5111 Encounter for antineoplastic chemotherapy: Secondary | ICD-10-CM

## 2014-12-04 DIAGNOSIS — K5900 Constipation, unspecified: Secondary | ICD-10-CM | POA: Diagnosis not present

## 2014-12-04 DIAGNOSIS — M542 Cervicalgia: Secondary | ICD-10-CM | POA: Diagnosis not present

## 2014-12-04 DIAGNOSIS — C221 Intrahepatic bile duct carcinoma: Secondary | ICD-10-CM

## 2014-12-04 LAB — COMPREHENSIVE METABOLIC PANEL (CC13)
ALT: 25 U/L (ref 0–55)
AST: 28 U/L (ref 5–34)
Albumin: 3.7 g/dL (ref 3.5–5.0)
Alkaline Phosphatase: 100 U/L (ref 40–150)
Anion Gap: 6 mEq/L (ref 3–11)
BILIRUBIN TOTAL: 0.56 mg/dL (ref 0.20–1.20)
BUN: 17.6 mg/dL (ref 7.0–26.0)
CHLORIDE: 104 meq/L (ref 98–109)
CO2: 29 mEq/L (ref 22–29)
Calcium: 9.9 mg/dL (ref 8.4–10.4)
Creatinine: 1.1 mg/dL (ref 0.6–1.1)
EGFR: 53 mL/min/{1.73_m2} — ABNORMAL LOW (ref >90)
GLUCOSE: 93 mg/dL (ref 70–140)
POTASSIUM: 3.8 meq/L (ref 3.5–5.1)
Sodium: 138 mEq/L (ref 136–145)
Total Protein: 6.9 g/dL (ref 6.4–8.3)

## 2014-12-04 LAB — CBC WITH DIFFERENTIAL/PLATELET
BASO%: 0.2 % (ref 0.0–2.0)
BASOS ABS: 0 10*3/uL (ref 0.0–0.1)
EOS%: 1.7 % (ref 0.0–7.0)
Eosinophils Absolute: 0.2 10*3/uL (ref 0.0–0.5)
HCT: 34.7 % — ABNORMAL LOW (ref 34.8–46.6)
HEMOGLOBIN: 11.6 g/dL (ref 11.6–15.9)
LYMPH%: 11.7 % — ABNORMAL LOW (ref 14.0–49.7)
MCH: 34.5 pg — ABNORMAL HIGH (ref 25.1–34.0)
MCHC: 33.4 g/dL (ref 31.5–36.0)
MCV: 103.3 fL — ABNORMAL HIGH (ref 79.5–101.0)
MONO#: 0.9 10*3/uL (ref 0.1–0.9)
MONO%: 7.9 % (ref 0.0–14.0)
NEUT%: 78.5 % — AB (ref 38.4–76.8)
NEUTROS ABS: 8.5 10*3/uL — AB (ref 1.5–6.5)
Platelets: 218 10*3/uL (ref 145–400)
RBC: 3.36 10*6/uL — ABNORMAL LOW (ref 3.70–5.45)
RDW: 17.3 % — ABNORMAL HIGH (ref 11.2–14.5)
WBC: 10.9 10*3/uL — ABNORMAL HIGH (ref 3.9–10.3)
lymph#: 1.3 10*3/uL (ref 0.9–3.3)

## 2014-12-04 MED ORDER — PROCHLORPERAZINE MALEATE 10 MG PO TABS
ORAL_TABLET | ORAL | Status: AC
  Filled 2014-12-04: qty 1

## 2014-12-04 MED ORDER — SODIUM CHLORIDE 0.9 % IV SOLN
Freq: Once | INTRAVENOUS | Status: AC
  Administered 2014-12-04: 14:00:00 via INTRAVENOUS

## 2014-12-04 MED ORDER — SODIUM CHLORIDE 0.9 % IJ SOLN
10.0000 mL | INTRAMUSCULAR | Status: DC | PRN
  Administered 2014-12-04: 10 mL
  Filled 2014-12-04: qty 10

## 2014-12-04 MED ORDER — SODIUM CHLORIDE 0.9 % IV SOLN
2000.0000 mg | Freq: Once | INTRAVENOUS | Status: AC
  Administered 2014-12-04: 2000 mg via INTRAVENOUS
  Filled 2014-12-04: qty 52.6

## 2014-12-04 MED ORDER — HEPARIN SOD (PORK) LOCK FLUSH 100 UNIT/ML IV SOLN
500.0000 [IU] | Freq: Once | INTRAVENOUS | Status: AC | PRN
  Administered 2014-12-04: 500 [IU]
  Filled 2014-12-04: qty 5

## 2014-12-04 MED ORDER — PROCHLORPERAZINE MALEATE 10 MG PO TABS
10.0000 mg | ORAL_TABLET | Freq: Once | ORAL | Status: AC
  Administered 2014-12-04: 10 mg via ORAL

## 2014-12-04 NOTE — Progress Notes (Signed)
Hematology and Oncology Follow Up Visit  Lindsay Aguirre [id number] 1950/11/28 64 y.o. 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 This was confirmed by biopsy on 08/14/2013 and imaging studies showed multifocal hepatic lesions.   Prior Therapy: Status post a liver biopsy on 08/14/2013.  Current therapy: Gemcitabine at 1000 mg per meter squared started on 09/06/2013 given weekly 3 weeks on and one week off for 6 cycles.  She is currently receiving her chemotherapy every other week for better tolerance.  Interim History: Lindsay Aguirre presents for follow up with a friend. Since her last visit, she is reporting symptoms of neck pain and left arm numbness. She had been diagnosed with cervical arthritis and have a MRI scheduled in the near future. She does not report any weakness in her arms but does report some occasional neuropathy. She is tolerating chemotherapy without any major complications. She reports occasional nausea controlled with just Zofran.  She does report some arthralgias and myalgia, grade 1. She does not report any fatigue and she continues to be active.  She continues to have an excellent quality of life and able to attends activities of daily living.   She reports no fever, chills, shortness of breath chest pain, night sweats or diarrhea.  Has not reported any hematochezia or melena. Has not reported any decline in her energy level or performance status. She continues to perform her activities of daily living without any decline. She has not reported any syncope or neurological changes. She has not reported any arthralgias or myalgias. Did not report any skin rashes or lesions. Did not report any lymphadenopathy or petechiae.   Remainder of her review of system is unremarkable.  Medications: I have reviewed the patient's  current medications. Unchanged by my review. Current Outpatient Prescriptions  Medication Sig Dispense Refill  . allopurinol (ZYLOPRIM) 300 MG tablet TAKE 1 TABLET (300 MG TOTAL) BY MOUTH DAILY. 30 tablet 2  . bifidobacterium infantis (ALIGN) capsule Take 1 capsule by mouth daily.    [name] BIOTIN PO Take 1 tablet by mouth daily.    . Cholecalciferol (VITAMIN D) 2000 UNITS tablet Take 2,000 Units by mouth daily.    [name] Sodium (COLACE PO) Take 3 capsules by mouth daily.    [name] HYDROcodone-acetaminophen (NORCO/VICODIN) 5-325 MG per tablet Take 1 tablet by mouth every 6 (six) hours as needed for moderate pain. 60 tablet 0  . ibuprofen (ADVIL,MOTRIN) 800 MG tablet   1  . lidocaine-prilocaine (EMLA) cream Apply 1 application topically as needed. Apply cream, on days of treatment, to [facility] site 1-2 hours prior to treatment. 30 g 0  . lisinopril-hydrochlorothiazide (PRINZIDE,ZESTORETIC) 20-12.5 MG per tablet Take 1 tablet by mouth every evening. 30 tablet 5  . meloxicam (MOBIC) 15 MG tablet Take 1 tablet (15 mg total) by mouth daily. 30 tablet 3  . ondansetron (ZOFRAN) 8 MG tablet Take 1 tablet (8 mg total) by mouth every 8 (eight) hours as needed for nausea or vomiting. 30 tablet 1  . polyethylene glycol powder (GLYCOLAX/MIRALAX) powder TAKE 1 CONTAINER TOTAL ONCE AS DIRECTED 255 g 1  . prochlorperazine (COMPAZINE) 10 MG tablet Take 1 tablet (10 mg total) by mouth every 6 (six) hours as needed for nausea or vomiting. 30 tablet 1  . senna (SENOKOT) 8.6 MG TABS tablet Take 2 tablets (17.2 mg total) by mouth daily. 60 each 0  .  SYNTHROID 88 MCG tablet TAKE 1 TABLET (88 MCG TOTAL) BY MOUTH DAILY BEFORE BREAKFAST. 30 tablet 5  . vitamin C (ASCORBIC ACID) 500 MG tablet Take 500 mg by mouth daily.     No current facility-administered medications for this visit.     Allergies:  Allergies  Allergen Reactions  . Livalo [Pitavastatin]     Leg cramps  . Simvastatin     Leg cramps  . Statins     Leg  Cramps     Past Medical History, Surgical history, Social history, and Family History were reviewed and updated.  Physical Exam: Blood pressure 125/65, pulse 83, temperature 98.5 F (36.9 C), temperature source Oral, resp. rate 18, height 5\' 6"  (1.676 m), weight 174 lb 9.6 oz (79.198 kg), SpO2 98 %. ECOG: 0 General appearance: alert and cooperative well-appearing woman. Head: Normocephalic, without obvious abnormality Neck: no adenopathy Lymph nodes: Cervical, supraclavicular, and axillary nodes normal. Heart:regular rate and rhythm, S1, S2.  Lung:chest clear, no wheezing. Dullness to percussion. Abdomin: soft, non-tender, without masses or organomegaly no rebound or guarding. EXT:no erythema, induration, or edema Skin: No rashes or lesions. Neurologically: intact without any sensory or motor deficits.   Lab Results: Lab Results  Component Value Date   WBC 10.9* 12/04/2014   HGB 11.6 12/04/2014   HCT 34.7* 12/04/2014   MCV 103.3* 12/04/2014   PLT 218 12/04/2014     Chemistry      Component Value Date/Time   NA 139 11/20/2014 0813   NA 141 07/10/2014 1306   NA 140 01/16/2014 1302   K 3.7 11/20/2014 0813   K 4.1 07/10/2014 1306   CL 98 07/10/2014 1306   CO2 26 11/20/2014 0813   CO2 24 07/10/2014 1306   BUN 15.7 11/20/2014 0813   BUN 16 07/10/2014 1306   BUN 10 01/16/2014 1302   CREATININE 1.0 11/20/2014 0813   CREATININE 0.79 07/10/2014 1306   CREATININE 0.81 10/24/2012 1203      Component Value Date/Time   CALCIUM 9.9 11/20/2014 0813   CALCIUM 10.1 07/10/2014 1306   ALKPHOS 100 11/20/2014 0813   ALKPHOS 83 07/10/2014 1306   AST 24 11/20/2014 0813   AST 35 07/10/2014 1306   ALT 23 11/20/2014 0813   ALT 32 07/10/2014 1306   BILITOT 0.59 11/20/2014 0813   BILITOT 0.4 07/10/2014 1306   BILITOT 0.5 01/16/2014 7523       64 year old woman with the following issues:  1. Multifocal hepatic metastasis of an adenocarcinoma llikely represents pancreaticobiliary  origin. She is on chemotherapy with gemcitabine 1000 mg day 1 and 15 of a 28 day cycle.    CT scan from 10/07/2014 showed mild progression of her disease. She have elected to continue with gemcitabine for better tolerance. The plan is to continue the same dose and schedule every other week. I think chemotherapy have offered her palliation of symptoms and reasonable disease control.  2. IV access: Port-A-Cath in place without any complications.   3. Antiemetics: Continue Zofran as needed.   4. Constipation: She uses MiraLax with excellent response. We will refill that for her today.  5. Neck pain: She is currently under evaluation for possible disc disease and an MRI scheduled for the near future.  6. Followup: Every 2 weeks for chemotherapy with a clinical visit in 4 weeks.  Zola Button, MD 8/4/201612:59 PM

## 2014-12-04 NOTE — Telephone Encounter (Signed)
Gave and printed appt sch3ed anda vs fo rpt for Aug and Sept

## 2014-12-04 NOTE — Patient Instructions (Signed)
[facility] Discharge Instructions for Patients Receiving Chemotherapy  Today you received the following chemotherapy agents: Gemzar  To help prevent nausea and vomiting after your treatment, we encourage you to take your nausea medication as prescribed by your physician.    If you develop nausea and vomiting that is not controlled by your nausea medication, call the clinic.   BELOW ARE SYMPTOMS THAT SHOULD BE REPORTED IMMEDIATELY:  *FEVER GREATER THAN 100.5 F  *CHILLS WITH OR WITHOUT FEVER  NAUSEA AND VOMITING THAT IS NOT CONTROLLED WITH YOUR NAUSEA MEDICATION  *UNUSUAL SHORTNESS OF BREATH  *UNUSUAL BRUISING OR BLEEDING  TENDERNESS IN MOUTH AND THROAT WITH OR WITHOUT PRESENCE OF ULCERS  *URINARY PROBLEMS  *BOWEL PROBLEMS  UNUSUAL RASH Items with * indicate a potential emergency and should be followed up as soon as possible.  Feel free to call the clinic you have any questions or concerns. The clinic phone number is [phone].  Please show the CHEMO ALERT CARD at check-in to the Emergency Department and triage nurse.   

## 2014-12-05 ENCOUNTER — Ambulatory Visit
Admission: RE | Admit: 2014-12-05 | Discharge: 2014-12-05 | Disposition: A | Discharge status: Home / Self Care | Payer: Medicare Other | Source: Ambulatory Visit | Attending: Orthopedic Surgery | Admitting: Orthopedic Surgery

## 2014-12-05 DIAGNOSIS — M542 Cervicalgia: Secondary | ICD-10-CM

## 2014-12-08 ENCOUNTER — Telehealth: Payer: Self-pay | Admitting: *Deleted

## 2014-12-08 ENCOUNTER — Encounter: Payer: Self-pay | Admitting: Oncology

## 2014-12-08 DIAGNOSIS — C7951 Secondary malignant neoplasm of bone: Secondary | ICD-10-CM

## 2014-12-08 DIAGNOSIS — C221 Intrahepatic bile duct carcinoma: Secondary | ICD-10-CM

## 2014-12-08 NOTE — Telephone Encounter (Signed)
Pt states she had neck pain on Thursday- Dr Alen Blew ordered MRI for Friday. Imaging called her and told her she needed to get in touch with Dr Alen Blew. Possible tumor.   Report given to Dr Alen Blew

## 2014-12-08 NOTE — Progress Notes (Signed)
The results of the MRI on 12/05/2014 was reviewed and discussed with the patient. Patient is currently under evaluation by [facility] regarding these findings. It appears that she has possible metastasis in the C7 vertebral body. She also appears to have disc protrusion around C3 and C4.  I have recommended to Lindsay Aguirre to go through her consultation today and if no surgery is recommended, she will likely need palliative radiation therapy to the suspicious area of metastasis.  I will make a referral for her to be evaluated by radiation oncology in any case. She will likely might need postoperative radiation in any case.  She is aware of this plan at this point.

## 2014-12-13 ENCOUNTER — Other Ambulatory Visit: Payer: Self-pay | Admitting: Nurse Practitioner

## 2014-12-13 NOTE — Telephone Encounter (Signed)
Last seen 11/07/14  WLW  Last thyroid level  12/02/13

## 2014-12-16 NOTE — Telephone Encounter (Signed)
Patient NTBS for follow up and lab work  
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
## 2014-12-17 ENCOUNTER — Ambulatory Visit
Admission: RE | Admit: 2014-12-17 | Discharge: 2014-12-17 | Disposition: A | Discharge status: Home / Self Care | Payer: Medicare Other | Source: Ambulatory Visit | Attending: Radiation Oncology | Admitting: Radiation Oncology

## 2014-12-17 ENCOUNTER — Encounter: Payer: Self-pay | Admitting: Radiation Oncology

## 2014-12-17 VITALS — BP 139/70 | HR 79 | Temp 98.0°F | Resp 20 | Wt 178.0 lb

## 2014-12-17 DIAGNOSIS — C7952 Secondary malignant neoplasm of bone marrow: Principal | ICD-10-CM

## 2014-12-17 DIAGNOSIS — G709 Myoneural disorder, unspecified: Secondary | ICD-10-CM | POA: Insufficient documentation

## 2014-12-17 DIAGNOSIS — C7951 Secondary malignant neoplasm of bone: Secondary | ICD-10-CM | POA: Insufficient documentation

## 2014-12-17 DIAGNOSIS — E785 Hyperlipidemia, unspecified: Secondary | ICD-10-CM | POA: Insufficient documentation

## 2014-12-17 DIAGNOSIS — I10 Essential (primary) hypertension: Secondary | ICD-10-CM | POA: Insufficient documentation

## 2014-12-17 DIAGNOSIS — Z510 Encounter for antineoplastic radiation therapy: Secondary | ICD-10-CM | POA: Diagnosis not present

## 2014-12-17 DIAGNOSIS — C221 Intrahepatic bile duct carcinoma: Secondary | ICD-10-CM | POA: Insufficient documentation

## 2014-12-17 DIAGNOSIS — C787 Secondary malignant neoplasm of liver and intrahepatic bile duct: Secondary | ICD-10-CM | POA: Insufficient documentation

## 2014-12-17 DIAGNOSIS — E039 Hypothyroidism, unspecified: Secondary | ICD-10-CM | POA: Insufficient documentation
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
## 2014-12-18 ENCOUNTER — Other Ambulatory Visit (HOSPITAL_BASED_OUTPATIENT_CLINIC_OR_DEPARTMENT_OTHER): Payer: Medicare Other

## 2014-12-18 ENCOUNTER — Ambulatory Visit (HOSPITAL_BASED_OUTPATIENT_CLINIC_OR_DEPARTMENT_OTHER): Payer: Medicare Other

## 2014-12-18 ENCOUNTER — Encounter: Payer: Self-pay | Admitting: Internal Medicine

## 2014-12-18 VITALS — BP 124/60 | HR 77 | Temp 98.7°F | Resp 20

## 2014-12-18 DIAGNOSIS — C221 Intrahepatic bile duct carcinoma: Secondary | ICD-10-CM

## 2014-12-18 DIAGNOSIS — Z5111 Encounter for antineoplastic chemotherapy: Secondary | ICD-10-CM | POA: Diagnosis present

## 2014-12-18 DIAGNOSIS — C801 Malignant (primary) neoplasm, unspecified: Secondary | ICD-10-CM | POA: Diagnosis present

## 2014-12-18 DIAGNOSIS — C787 Secondary malignant neoplasm of liver and intrahepatic bile duct: Secondary | ICD-10-CM

## 2014-12-18 LAB — COMPREHENSIVE METABOLIC PANEL (CC13)
ALBUMIN: 3.5 g/dL (ref 3.5–5.0)
ALK PHOS: 95 U/L (ref 40–150)
ALT: 22 U/L (ref 0–55)
AST: 26 U/L (ref 5–34)
Anion Gap: 11 mEq/L (ref 3–11)
BILIRUBIN TOTAL: 0.57 mg/dL (ref 0.20–1.20)
BUN: 12.8 mg/dL (ref 7.0–26.0)
CALCIUM: 10.1 mg/dL (ref 8.4–10.4)
CO2: 25 mEq/L (ref 22–29)
Chloride: 104 mEq/L (ref 98–109)
Creatinine: 0.8 mg/dL (ref 0.6–1.1)
EGFR: 76 mL/min/{1.73_m2} — AB (ref >90)
GLUCOSE: 96 mg/dL (ref 70–140)
Potassium: 3.5 mEq/L (ref 3.5–5.1)
SODIUM: 140 meq/L (ref 136–145)
TOTAL PROTEIN: 6.5 g/dL (ref 6.4–8.3)

## 2014-12-18 LAB — CBC WITH DIFFERENTIAL/PLATELET
BASO%: 0.5 % (ref 0.0–2.0)
BASOS ABS: 0.1 10*3/uL (ref 0.0–0.1)
EOS ABS: 0.1 10*3/uL (ref 0.0–0.5)
EOS%: 0.5 % (ref 0.0–7.0)
HEMATOCRIT: 33.2 % — AB (ref 34.8–46.6)
HEMOGLOBIN: 11.2 g/dL — AB (ref 11.6–15.9)
LYMPH#: 0.8 10*3/uL — AB (ref 0.9–3.3)
LYMPH%: 7.7 % — ABNORMAL LOW (ref 14.0–49.7)
MCH: 35.1 pg — ABNORMAL HIGH (ref 25.1–34.0)
MCHC: 33.8 g/dL (ref 31.5–36.0)
MCV: 103.9 fL — AB (ref 79.5–101.0)
MONO#: 0.9 10*3/uL (ref 0.1–0.9)
MONO%: 8.4 % (ref 0.0–14.0)
NEUT%: 82.9 % — ABNORMAL HIGH (ref 38.4–76.8)
NEUTROS ABS: 8.5 10*3/uL — AB (ref 1.5–6.5)
Platelets: 238 10*3/uL (ref 145–400)
RBC: 3.19 10*6/uL — ABNORMAL LOW (ref 3.70–5.45)
RDW: 18.9 % — AB (ref 11.2–14.5)
WBC: 10.3 10*3/uL (ref 3.9–10.3)

## 2014-12-18 MED ORDER — SODIUM CHLORIDE 0.9 % IJ SOLN
10.0000 mL | INTRAMUSCULAR | Status: DC | PRN
  Administered 2014-12-18: 10 mL
  Filled 2014-12-18: qty 10

## 2014-12-18 MED ORDER — SODIUM CHLORIDE 0.9 % IV SOLN
2000.0000 mg | Freq: Once | INTRAVENOUS | Status: AC
  Administered 2014-12-18: 2000 mg via INTRAVENOUS
  Filled 2014-12-18: qty 52.6

## 2014-12-18 MED ORDER — HEPARIN SOD (PORK) LOCK FLUSH 100 UNIT/ML IV SOLN
500.0000 [IU] | Freq: Once | INTRAVENOUS | Status: AC | PRN
  Administered 2014-12-18: 500 [IU]
  Filled 2014-12-18: qty 5

## 2014-12-18 MED ORDER — PROCHLORPERAZINE MALEATE 10 MG PO TABS
ORAL_TABLET | ORAL | Status: AC
  Filled 2014-12-18: qty 1

## 2014-12-18 MED ORDER — PROCHLORPERAZINE MALEATE 10 MG PO TABS
10.0000 mg | ORAL_TABLET | Freq: Once | ORAL | Status: AC
  Administered 2014-12-18: 10 mg via ORAL

## 2014-12-18 MED ORDER — SODIUM CHLORIDE 0.9 % IV SOLN
Freq: Once | INTRAVENOUS | Status: AC
  Administered 2014-12-18: 14:00:00 via INTRAVENOUS

## 2014-12-18 NOTE — Patient Instructions (Signed)
[facility] Discharge Instructions for Patients Receiving Chemotherapy  Today you received the following chemotherapy agents Gemzar.  To help prevent nausea and vomiting after your treatment, we encourage you to take your nausea medication.   If you develop nausea and vomiting that is not controlled by your nausea medication, call the clinic.   BELOW ARE SYMPTOMS THAT SHOULD BE REPORTED IMMEDIATELY:  *FEVER GREATER THAN 100.5 F  *CHILLS WITH OR WITHOUT FEVER  NAUSEA AND VOMITING THAT IS NOT CONTROLLED WITH YOUR NAUSEA MEDICATION  *UNUSUAL SHORTNESS OF BREATH  *UNUSUAL BRUISING OR BLEEDING  TENDERNESS IN MOUTH AND THROAT WITH OR WITHOUT PRESENCE OF ULCERS  *URINARY PROBLEMS  *BOWEL PROBLEMS  UNUSUAL RASH Items with * indicate a potential emergency and should be followed up as soon as possible.  Feel free to call the clinic you have any questions or concerns. The clinic phone number is [phone].  Please show the CHEMO ALERT CARD at check-in to the Emergency Department and triage nurse.   

## 2014-12-19 ENCOUNTER — Telehealth: Payer: Self-pay | Admitting: *Deleted

## 2014-12-19 NOTE — Telephone Encounter (Signed)
Called patient and asked what Surgeon is she seen or going to see?"I have ana ppt with Dr. Rigoberto Noel in [place] on Monday,  At 1030 am, ([place]) [place] ,[place]. [phone] My daughter has the address and all information, I had chemotherapy yesterday and was so worn out after I just went top bed when I got home, " She gave me her daughter's name and number but I found MD name and dress ,  Thanked her , he stated she was feeling better this am, will in basket MD 9:32 AM

## 2014-12-25 ENCOUNTER — Other Ambulatory Visit: Payer: Self-pay | Admitting: *Deleted

## 2014-12-25 ENCOUNTER — Telehealth: Payer: Self-pay | Admitting: *Deleted

## 2014-12-25 NOTE — Telephone Encounter (Signed)
PT. WANTS TO CANCEL CHEMOTHERAPY ON 01/01/15.

## 2014-12-25 NOTE — Telephone Encounter (Signed)
Received voice message from patient stating,"I am having neck surgery on 01/02/15. I have a lab/MD/chemo appointment on 01/01/15. Do I need to keep those appointments or reschedule them?" Return number is [phone].

## 2014-12-26 ENCOUNTER — Telehealth: Payer: Self-pay | Admitting: *Deleted

## 2014-12-26 ENCOUNTER — Telehealth: Payer: Self-pay | Admitting: Oncology

## 2014-12-26 NOTE — Telephone Encounter (Signed)
Patient called in on emergency line and left message that she was concerned about having her treatment on 01/01/15 as she was having neck surgery on 01/02/15.  Reviewed chart - multiple phone calls re: this issue.  Called patient back - no answer  Left message and reviewed patient's schedule for lab/MD/RX for 01/15/15 - no appt. For 01/01/15.

## 2014-12-26 NOTE — Telephone Encounter (Signed)
S/w pt confirming labs/ov added and moved up. Also advised of cancelled apt on 09/01 per MD due to pt is having surgery. Mailed out schedule per pt's request....  KJ

## 2014-12-26 NOTE — Telephone Encounter (Signed)
Per staff message and POF I have scheduled appts. Advised scheduler of appts. JMW  

## 2014-12-31 NOTE — Addendum Note (Signed)
Encounter addended by: Doreen Beam, RN on: 12/31/2014  9:08 AM<BR>     Documentation filed: Charges VN

## 2015-01-01 ENCOUNTER — Other Ambulatory Visit: Payer: Medicare Other

## 2015-01-01 ENCOUNTER — Ambulatory Visit: Payer: Medicare Other | Admitting: Oncology

## 2015-01-01 ENCOUNTER — Ambulatory Visit: Payer: Medicare Other

## 2015-01-07 ENCOUNTER — Telehealth: Payer: Self-pay | Admitting: *Deleted

## 2015-01-07 ENCOUNTER — Encounter: Payer: Self-pay | Admitting: Radiation Oncology

## 2015-01-07 NOTE — Telephone Encounter (Signed)
CALLED PATIENT TO INFORM OF [facility] APPT. ON 01-19-15, LVM FOR A RETURN CALL

## 2015-01-12 ENCOUNTER — Telehealth: Payer: Self-pay | Admitting: Oncology

## 2015-01-12 NOTE — Telephone Encounter (Signed)
Patient daughter confirmed appointment for 10/06 per staff message.

## 2015-01-14 ENCOUNTER — Encounter: Payer: Self-pay | Admitting: Family Medicine

## 2015-01-14 ENCOUNTER — Ambulatory Visit (INDEPENDENT_AMBULATORY_CARE_PROVIDER_SITE_OTHER): Payer: Medicare Other | Admitting: Family Medicine

## 2015-01-14 ENCOUNTER — Other Ambulatory Visit: Payer: Self-pay | Admitting: Nurse Practitioner

## 2015-01-14 ENCOUNTER — Ambulatory Visit (INDEPENDENT_AMBULATORY_CARE_PROVIDER_SITE_OTHER): Payer: Medicare Other

## 2015-01-14 VITALS — BP 102/62 | HR 100 | Temp 99.2°F | Ht 66.0 in | Wt 173.0 lb

## 2015-01-14 DIAGNOSIS — R05 Cough: Secondary | ICD-10-CM

## 2015-01-14 DIAGNOSIS — R059 Cough, unspecified: Secondary | ICD-10-CM

## 2015-01-14 DIAGNOSIS — J189 Pneumonia, unspecified organism: Secondary | ICD-10-CM

## 2015-01-14 MED ORDER — BENZONATATE 200 MG PO CAPS: 200.0000 mg | ORAL_CAPSULE | Freq: Two times a day (BID) | ORAL | Status: DC | PRN

## 2015-01-14 MED ORDER — LEVOFLOXACIN 750 MG PO TABS: 750.0000 mg | ORAL_TABLET | Freq: Every day | ORAL | Status: DC

## 2015-01-14 MED ORDER — ALBUTEROL SULFATE HFA 108 (90 BASE) MCG/ACT IN AERS: 2.0000 | INHALATION_SPRAY | Freq: Four times a day (QID) | RESPIRATORY_TRACT | Status: DC | PRN

## 2015-01-14 NOTE — Progress Notes (Signed)
BP 102/62 mmHg  Pulse 100  Temp(Src) 99.2 F (37.3 C) (Oral)  Ht '5\' 6"'  (1.676 m)  Wt 173 lb (78.472 kg)  BMI 27.94 kg/m2  SpO2 95%   Subjective:    Patient ID: Mathis Bud, female    DOB: 1951/01/28, 64 y.o.   MRN: [id number]  HPI: MYLEKA MONCURE is a 64 y.o. female presenting on 01/14/2015 for Cough   HPI  Cough Patient has been having worsening cough and some shortness of breath since her neck surgery couple weeks ago. The cough is causing her to have emesis occasionally and she has pain with coughing in her ribs. After her surgery in the postop period they also had to give her antibiotics and oxygen. She denies fevers or chills and her cough is nonproductive.  Relevant past medical, surgical, family and social history reviewed and updated as indicated. Interim medical history since our last visit reviewed. Allergies and medications reviewed and updated.  Review of Systems  Constitutional: Negative for fever and chills.  HENT: Negative for congestion, ear discharge, ear pain, postnasal drip, rhinorrhea, sinus pressure, sore throat and voice change.   Eyes: Negative for pain, redness and visual disturbance.  Respiratory: Positive for cough, chest tightness and shortness of breath.   Cardiovascular: Negative for chest pain, palpitations and leg swelling.  Genitourinary: Negative for dysuria and difficulty urinating.  Musculoskeletal: Negative for back pain and gait problem.  Skin: Negative for rash.  Neurological: Negative for light-headedness and headaches.  Psychiatric/Behavioral: Negative for behavioral problems and agitation.  All other systems reviewed and are negative.   Per HPI unless specifically indicated above     Medication List       This list is accurate as of: 01/14/15  3:08 PM.  Always use your most recent med list.               albuterol 108 (90 BASE) MCG/ACT inhaler  Commonly known as:  PROVENTIL HFA;VENTOLIN HFA  Inhale 2 puffs into  the lungs every 6 (six) hours as needed for wheezing or shortness of breath.     allopurinol 300 MG tablet  Commonly known as:  ZYLOPRIM  TAKE 1 TABLET (300 MG TOTAL) BY MOUTH DAILY.     benzonatate 200 MG capsule  Commonly known as:  TESSALON  Take 1 capsule (200 mg total) by mouth 2 (two) times daily as needed for cough.     bifidobacterium infantis capsule  Take 1 capsule by mouth daily.     BIOTIN PO  Take 1 tablet by mouth daily.     gabapentin 100 MG capsule  Commonly known as:  NEURONTIN  TAKE 1 CAPSULE AT BEDTIME ONCE DAILY X3 DAYS THEN ONE TWICE DAILY X3DAYS THEN ONE CAPSULE 3X DAILY     HYDROcodone-acetaminophen 5-325 MG per tablet  Commonly known as:  NORCO/VICODIN  Take 1 tablet by mouth every 6 (six) hours as needed for moderate pain.     levofloxacin 750 MG tablet  Commonly known as:  LEVAQUIN  Take 1 tablet (750 mg total) by mouth daily.     lidocaine-prilocaine cream  Commonly known as:  EMLA  Apply 1 application topically as needed. Apply cream, on days of treatment, to [facility] site 1-2 hours prior to treatment.     lisinopril-hydrochlorothiazide 20-12.5 MG per tablet  Commonly known as:  PRINZIDE,ZESTORETIC  Take 1 tablet by mouth every evening.     ondansetron 8 MG tablet  Commonly known as:  ZOFRAN  Take 1 tablet (8 mg total) by mouth every 8 (eight) hours as needed for nausea or vomiting.     polyethylene glycol powder powder  Commonly known as:  GLYCOLAX/MIRALAX  TAKE 1 CONTAINER TOTAL ONCE AS DIRECTED     prochlorperazine 10 MG tablet  Commonly known as:  COMPAZINE  Take 1 tablet (10 mg total) by mouth every 6 (six) hours as needed for nausea or vomiting.     senna 8.6 MG Tabs tablet  Commonly known as:  SENOKOT  Take 2 tablets (17.2 mg total) by mouth daily.     SYNTHROID 88 MCG tablet  Generic drug:  levothyroxine  TAKE 1 TABLET (88 MCG TOTAL) BY MOUTH DAILY BEFORE BREAKFAST.     vitamin C 500 MG tablet  Commonly known as:  ASCORBIC ACID    Take 500 mg by mouth daily.     Vitamin D 2000 UNITS tablet  Take 2,000 Units by mouth daily.           Objective:    BP 102/62 mmHg  Pulse 100  Temp(Src) 99.2 F (37.3 C) (Oral)  Ht '5\' 6"'  (1.676 m)  Wt 173 lb (78.472 kg)  BMI 27.94 kg/m2  SpO2 95%  Wt Readings from Last 3 Encounters:  01/14/15 173 lb (78.472 kg)  12/17/14 178 lb (80.74 kg)  12/04/14 174 lb 9.6 oz (79.198 kg)    Physical Exam  Constitutional: She is oriented to person, place, and time. She appears well-developed and well-nourished. No distress.  HENT:  Right Ear: External ear normal.  Left Ear: External ear normal.  Nose: Nose normal.  Mouth/Throat: Oropharynx is clear and moist. No oropharyngeal exudate.  Eyes: Conjunctivae and EOM are normal. Pupils are equal, round, and reactive to light.  Neck: Neck supple. No thyromegaly present.  Cardiovascular: Normal rate, regular rhythm, normal heart sounds and intact distal pulses.   No murmur heard. Pulmonary/Chest: Effort normal. No respiratory distress. She has no wheezes. She has rales (Rales in central region of both lobes on the back.). She exhibits no tenderness.  Abdominal: Soft. Bowel sounds are normal. She exhibits no distension. There is no tenderness.  Musculoskeletal: Normal range of motion. She exhibits no edema or tenderness.  Lymphadenopathy:    She has no cervical adenopathy.  Neurological: She is alert and oriented to person, place, and time. Coordination normal.  Skin: Skin is warm and dry. No rash noted. She is not diaphoretic.  Psychiatric: She has a normal mood and affect. Her behavior is normal.  Vitals reviewed.   Results for orders placed or performed in visit on 12/18/14  CBC with Differential/Platelet  Result Value Ref Range   WBC 10.3 3.9 - 10.3 10e3/uL   NEUT# 8.5 (H) 1.5 - 6.5 10e3/uL   HGB 11.2 (L) 11.6 - 15.9 g/dL   HCT 33.2 (L) 34.8 - 46.6 %   Platelets 238 145 - 400 10e3/uL   MCV 103.9 (H) 79.5 - 101.0 fL   MCH 35.1  (H) 25.1 - 34.0 pg   MCHC 33.8 31.5 - 36.0 g/dL   RBC 3.19 (L) 3.70 - 5.45 10e6/uL   RDW 18.9 (H) 11.2 - 14.5 %   lymph# 0.8 (L) 0.9 - 3.3 10e3/uL   MONO# 0.9 0.1 - 0.9 10e3/uL   Eosinophils Absolute 0.1 0.0 - 0.5 10e3/uL   Basophils Absolute 0.1 0.0 - 0.1 10e3/uL   NEUT% 82.9 (H) 38.4 - 76.8 %   LYMPH% 7.7 (L) 14.0 - 49.7 %   MONO%  8.4 0.0 - 14.0 %   EOS% 0.5 0.0 - 7.0 %   BASO% 0.5 0.0 - 2.0 %  Comprehensive metabolic panel  Result Value Ref Range   Sodium 140 136 - 145 mEq/L   Potassium 3.5 3.5 - 5.1 mEq/L   Chloride 104 98 - 109 mEq/L   CO2 25 22 - 29 mEq/L   Glucose 96 70 - 140 mg/dl   BUN 12.8 7.0 - 26.0 mg/dL   Creatinine 0.8 0.6 - 1.1 mg/dL   Total Bilirubin 0.57 0.20 - 1.20 mg/dL   Alkaline Phosphatase 95 40 - 150 U/L   AST 26 5 - 34 U/L   ALT 22 0 - 55 U/L   Total Protein 6.5 6.4 - 8.3 g/dL   Albumin 3.5 3.5 - 5.0 g/dL   Calcium 10.1 8.4 - 10.4 mg/dL   Anion Gap 11 3 - 11 mEq/L   EGFR 76 (L) >90 ml/min/1.73 m2   Chest x-ray: Chest x-ray shows early infiltrate on right and left hilar regions, this preliminary read by Dr. Warrick Parisian wait final read by radiologist.    Assessment & Plan:   Problem List Items Addressed This Visit    None    Visit Diagnoses    Cough    -  Primary    Relevant Orders    DG Chest 2 View    Pneumonia, organism unspecified        Patient has had increased cough and some shortness of breath since neck surgery. Chest x-ray shows early infiltrate on perihilar regions    Relevant Medications    levofloxacin (LEVAQUIN) 750 MG tablet    albuterol (PROVENTIL HFA;VENTOLIN HFA) 108 (90 BASE) MCG/ACT inhaler    benzonatate (TESSALON) 200 MG capsule        Follow up plan: Return in about 2 weeks (around 01/28/2015), or if symptoms worsen or fail to improve.  Caryl Pina, MD [facility] 01/14/2015, 3:08 PM

## 2015-01-14 NOTE — Telephone Encounter (Signed)
Last thyroid panel is August 2015

## 2015-01-14 NOTE — Patient Instructions (Signed)

## 2015-01-15 ENCOUNTER — Ambulatory Visit: Payer: Medicare Other | Admitting: Oncology

## 2015-01-15 ENCOUNTER — Other Ambulatory Visit: Payer: Medicare Other

## 2015-01-15 ENCOUNTER — Ambulatory Visit: Payer: Medicare Other

## 2015-01-16 NOTE — Progress Notes (Addendum)
FUNC  Bone Metastases was seen in Clinic  12/17/14  Neck Surgery   Here for Palliative radiation  Therapy   Possible metastases C7,   Neck Surgery 01/02/15 Dr. Gaspar Cola  F/u 01/15/15 removed 18 staples  In neck   MRI  12/08/14: [facility]= Lytic destructive lesions involving the left lateral lamina of C5, and medial lateral masses b/l at C6,the posterior elements at C7 with destruction of the pedicles b/l eroding  Into the C7 vertebral body,  With destruction of all of the posterior elements at C7 Numbnes and tingling left arm and hand approximately 5 months,Hx of prior cervical fusion, hx cholangiocarcinoma   Patient incision on back of neck healed, slight swelling,  Pain 3/4 on 10 scale,stinging burning on neck, wearing soft neck collar brace Nausea took compazine at 1100, offered moistcool cloth,declined, also declind offer of ginger ale, "I just drank some stated patient   POSTERIOR CERVICAL LAMINECTOMY C4-7, C3-T2 fusion with depuy rod/screws, cadaveric allograft Cervical laminectomy for spinal mets neuromonitoring (emg, mep, ssep) Prone view headholder ultrasound N/A General Spine Lumbar Clean      Panel 2 Procedure   BP 111/62 mmHg  Pulse 108  Temp(Src) 98.6 F (37 C) (Oral)  Resp 20  Ht 5\' 6"  (1.676 m)  Wt 167 lb 8 oz (75.978 kg)  BMI 27.05 kg/m2  SpO2 98%  Wt Readings from Last 3 Encounters:  01/19/15 167 lb 8 oz (75.978 kg)  01/14/15 173 lb (78.472 kg)  12/17/14 178 lb (80.74 kg)

## 2015-01-16 NOTE — Progress Notes (Addendum)
FUNC Bone metastases  Neck Surgery 01/02/15 Dr. Gershon Crane

## 2015-01-19 ENCOUNTER — Encounter: Payer: Self-pay | Admitting: Radiation Oncology

## 2015-01-19 ENCOUNTER — Ambulatory Visit
Admission: RE | Admit: 2015-01-19 | Discharge: 2015-01-19 | Disposition: A | Discharge status: Home / Self Care | Payer: Medicare Other | Source: Ambulatory Visit | Attending: Radiation Oncology | Admitting: Radiation Oncology

## 2015-01-19 VITALS — BP 111/62 | HR 108 | Temp 98.6°F | Resp 20 | Ht 66.0 in | Wt 167.5 lb

## 2015-01-19 DIAGNOSIS — C7952 Secondary malignant neoplasm of bone marrow: Principal | ICD-10-CM

## 2015-01-19 DIAGNOSIS — C7951 Secondary malignant neoplasm of bone: Secondary | ICD-10-CM | POA: Diagnosis not present

## 2015-01-19 NOTE — Progress Notes (Signed)
Please see the Nurse Progress Note in the MD Initial Consult Encounter for this patient. 

## 2015-01-21 ENCOUNTER — Ambulatory Visit
Admission: RE | Admit: 2015-01-21 | Discharge: 2015-01-21 | Disposition: A | Discharge status: Home / Self Care | Payer: Medicare Other | Source: Ambulatory Visit | Attending: Radiation Oncology | Admitting: Radiation Oncology

## 2015-01-21 ENCOUNTER — Telehealth: Payer: Self-pay | Admitting: Nurse Practitioner

## 2015-01-21 DIAGNOSIS — C7951 Secondary malignant neoplasm of bone: Secondary | ICD-10-CM

## 2015-01-21 DIAGNOSIS — Z510 Encounter for antineoplastic radiation therapy: Secondary | ICD-10-CM | POA: Diagnosis not present

## 2015-01-21 DIAGNOSIS — E039 Hypothyroidism, unspecified: Secondary | ICD-10-CM

## 2015-01-21 MED ORDER — SYNTHROID 88 MCG PO TABS: ORAL_TABLET | ORAL | Status: DC

## 2015-01-21 NOTE — Telephone Encounter (Signed)
Done for 1 month per VO by C. Hawks

## 2015-01-23 NOTE — Progress Notes (Addendum)
  Radiation Oncology         [phone] ________________________________  Name: Lindsay Aguirre MRN: [id number]  Date: 01/21/2015  DOB: 07/25/1950     ICD-9-CM ICD-10-CM   1. Osseous metastasis 198.5 C79.51      SIMULATION AND TREATMENT PLANNING NOTE  DIAGNOSIS:  Metastatic cholangiocarcinoma With osseous metastasis  Site:  C4-T1  NARRATIVE:  The patient was brought to the [place].  Identity was confirmed.  All relevant records and images related to the planned course of therapy were reviewed.   Written consent to proceed with treatment was confirmed which was freely given after reviewing the details related to the planned course of therapy had been reviewed with the patient.  Then, the patient was set-up in a stable reproducible  supine position for radiation therapy.  CT images were obtained.  Surface markings were placed.    Medically necessary complex treatment device(s) for immobilization:  Customized thermoplastic head cast.   The CT images were loaded into the planning software.  Then the target and avoidance structures were contoured.  Treatment planning then occurred.  The radiation prescription was entered and confirmed.  A total of 2 complex treatment devices were fabricated which relate to the designed radiation treatment fields. Each of these customized fields/ complex treatment devices will be used on a daily basis during the radiation course. I have requested : Isodose Plan.   PLAN:  The patient will receive 30 Gy in 10 fractions.  ________________________________   Jodelle Gross, MD, PhD

## 2015-01-27 DIAGNOSIS — Z510 Encounter for antineoplastic radiation therapy: Secondary | ICD-10-CM | POA: Diagnosis not present

## 2015-01-29 ENCOUNTER — Ambulatory Visit
Admission: RE | Admit: 2015-01-29 | Discharge: 2015-01-29 | Disposition: A | Discharge status: Home / Self Care | Payer: Medicare Other | Source: Ambulatory Visit | Attending: Radiation Oncology | Admitting: Radiation Oncology

## 2015-01-29 DIAGNOSIS — C7951 Secondary malignant neoplasm of bone: Secondary | ICD-10-CM

## 2015-01-29 DIAGNOSIS — Z510 Encounter for antineoplastic radiation therapy: Secondary | ICD-10-CM | POA: Diagnosis not present

## 2015-01-30 ENCOUNTER — Ambulatory Visit
Admission: RE | Admit: 2015-01-30 | Discharge: 2015-01-30 | Disposition: A | Discharge status: Home / Self Care | Payer: Medicare Other | Source: Ambulatory Visit | Attending: Radiation Oncology | Admitting: Radiation Oncology

## 2015-01-30 ENCOUNTER — Encounter: Payer: Self-pay | Admitting: Radiation Oncology

## 2015-01-30 VITALS — BP 119/61 | HR 113 | Temp 98.1°F | Resp 20 | Wt 163.7 lb

## 2015-01-30 DIAGNOSIS — C7951 Secondary malignant neoplasm of bone: Secondary | ICD-10-CM

## 2015-01-30 DIAGNOSIS — Z510 Encounter for antineoplastic radiation therapy: Secondary | ICD-10-CM | POA: Diagnosis not present

## 2015-01-30 MED ORDER — BIAFINE EX EMUL
CUTANEOUS | Status: DC | PRN
  Administered 2015-01-30: 12:00:00 via TOPICAL

## 2015-01-30 NOTE — Progress Notes (Signed)
Department of Radiation Oncology  Phone:  [phone] Fax:        [phone]  Weekly Treatment Note    Name: Lindsay Aguirre Date: 01/30/2015 MRN: [id number] DOB: 01/24/1951   Current dose: 6 Gy  Current fraction:2   MEDICATIONS: Current Outpatient Prescriptions  Medication Sig Dispense Refill  . allopurinol (ZYLOPRIM) 300 MG tablet TAKE 1 TABLET (300 MG TOTAL) BY MOUTH DAILY. 30 tablet 2  . bifidobacterium infantis (ALIGN) capsule Take 1 capsule by mouth daily.    [name] HYDROcodone-acetaminophen (NORCO/VICODIN) 5-325 MG per tablet Take 1 tablet by mouth every 6 (six) hours as needed for moderate pain. 60 tablet 0  . lidocaine-prilocaine (EMLA) cream Apply 1 application topically as needed. Apply cream, on days of treatment, to [facility] site 1-2 hours prior to treatment. 30 g 0  . ondansetron (ZOFRAN) 8 MG tablet Take 1 tablet (8 mg total) by mouth every 8 (eight) hours as needed for nausea or vomiting. 30 tablet 1  . polyethylene glycol powder (GLYCOLAX/MIRALAX) powder TAKE 1 CONTAINER TOTAL ONCE AS DIRECTED 255 g 1  . prochlorperazine (COMPAZINE) 10 MG tablet Take 1 tablet (10 mg total) by mouth every 6 (six) hours as needed for nausea or vomiting. 30 tablet 1  . senna (SENOKOT) 8.6 MG TABS tablet Take 2 tablets (17.2 mg total) by mouth daily. 60 each 0  . SYNTHROID 88 MCG tablet TAKE 1 TABLET (88 MCG TOTAL) BY MOUTH DAILY BEFORE BREAKFAST. 30 tablet 0  . albuterol (PROVENTIL HFA;VENTOLIN HFA) 108 (90 BASE) MCG/ACT inhaler Inhale 2 puffs into the lungs every 6 (six) hours as needed for wheezing or shortness of breath. (Patient not taking: Reported on 01/30/2015) 1 Inhaler 0  . benzonatate (TESSALON) 200 MG capsule Take 1 capsule (200 mg total) by mouth 2 (two) times daily as needed for cough. (Patient not taking: Reported on 01/30/2015) 20 capsule 0  . BIOTIN PO Take 1 tablet by mouth daily.    . Cholecalciferol (VITAMIN D) 2000 UNITS tablet Take 2,000 Units by mouth daily.    [name]  gabapentin (NEURONTIN) 100 MG capsule TAKE 1 CAPSULE AT BEDTIME ONCE DAILY X3 DAYS THEN ONE TWICE DAILY X3DAYS THEN ONE CAPSULE 3X DAILY  0  . levofloxacin (LEVAQUIN) 750 MG tablet Take 1 tablet (750 mg total) by mouth daily. (Patient not taking: Reported on 01/30/2015) 14 tablet 0  . lisinopril-hydrochlorothiazide (PRINZIDE,ZESTORETIC) 20-12.5 MG per tablet Take 1 tablet by mouth every evening. (Patient not taking: Reported on 01/30/2015) 30 tablet 5  . vitamin C (ASCORBIC ACID) 500 MG tablet Take 500 mg by mouth daily.     Current Facility-Administered Medications  Medication Dose Route Frequency Provider Last Rate Last Dose  . topical emolient (BIAFINE) emulsion   Topical PRN Kyung Rudd, MD         ALLERGIES: Livalo; Simvastatin; and Statins   LABORATORY DATA:  Lab Results  Component Value Date   WBC 10.3 12/18/2014   HGB 11.2* 12/18/2014   HCT 33.2* 12/18/2014   MCV 103.9* 12/18/2014   PLT 238 12/18/2014   Lab Results  Component Value Date   NA 140 12/18/2014   K 3.5 12/18/2014   CL 98 07/10/2014   CO2 25 12/18/2014   Lab Results  Component Value Date   ALT 22 12/18/2014   AST 26 12/18/2014   ALKPHOS 95 12/18/2014   BILITOT 0.57 12/18/2014     NARRATIVE: Lindsay Aguirre was seen today for weekly treatment management. The chart was checked and  the patient's films were reviewed.  Weekly rad tx C5-T1   2/10 completd, wearing neck collar, pt education done, biafine cream given, my business card, radiation book , discussed ways to manage  Fatigue,skin irritation esophageal changes,throat changes, pain, increase protein in diet, drink plenty water, if food feels like getting stuck in throat ask for a=carafate, no skin changes  As of yet, good appetite, no nausea, teach back given. Eating more protein, drinks boost 1-2 today 11:19 AM BP 119/61 mmHg  Pulse 113  Temp(Src) 98.1 F (36.7 C) (Oral)  Resp 20  Wt 163 lb 11.2 oz (74.254 kg)  SpO2 97%  Wt Readings from Last 3  Encounters:  01/30/15 163 lb 11.2 oz (74.254 kg)  01/19/15 167 lb 8 oz (75.978 kg)  01/19/15 167 lb 8 oz (75.978 kg)    PHYSICAL EXAMINATION: weight is 163 lb 11.2 oz (74.254 kg). Her oral temperature is 98.1 F (36.7 C). Her blood pressure is 119/61 and her pulse is 113. Her respiration is 20 and oxygen saturation is 97%.        ASSESSMENT: The patient is doing satisfactorily with treatment.  PLAN: We will continue with the patient's radiation treatment as planned.

## 2015-01-30 NOTE — Addendum Note (Signed)
Encounter addended by: Doreen Beam, RN on: 01/30/2015 12:24 PM<BR>     Documentation filed: Medications, Inpatient [facility]

## 2015-01-30 NOTE — Addendum Note (Signed)
Encounter addended by: Kyung Rudd, MD on: 01/30/2015 12:01 PM<BR>     Documentation filed: Notes Section

## 2015-01-30 NOTE — Progress Notes (Addendum)
Weekly rad tx C5-T1   2/10 completd, wearing neck collar, pt education done, biafine cream given, my business card, radiation book , discussed ways to manage  Fatigue,skin irritation esophageal changes,throat changes, pain, increase protein in diet, drink plenty water, if food feels like getting stuck in throat ask for a=carafate, no skin changes  As of yet, good appetite, no nausea, teach back given. Eating more protein, drinks boost 1-2 today 11:10 AM BP 119/61 mmHg  Pulse 113  Temp(Src) 98.1 F (36.7 C) (Oral)  Resp 20  Wt 163 lb 11.2 oz (74.254 kg)  SpO2 97%  Wt Readings from Last 3 Encounters:  01/30/15 163 lb 11.2 oz (74.254 kg)  01/19/15 167 lb 8 oz (75.978 kg)  01/19/15 167 lb 8 oz (75.978 kg)

## 2015-02-02 ENCOUNTER — Ambulatory Visit
Admission: RE | Admit: 2015-02-02 | Discharge: 2015-02-02 | Disposition: A | Discharge status: Home / Self Care | Payer: Medicare Other | Source: Ambulatory Visit | Attending: Radiation Oncology | Admitting: Radiation Oncology

## 2015-02-02 DIAGNOSIS — I10 Essential (primary) hypertension: Secondary | ICD-10-CM | POA: Diagnosis not present

## 2015-02-02 DIAGNOSIS — E785 Hyperlipidemia, unspecified: Secondary | ICD-10-CM | POA: Diagnosis not present

## 2015-02-02 DIAGNOSIS — C7951 Secondary malignant neoplasm of bone: Secondary | ICD-10-CM | POA: Diagnosis present

## 2015-02-02 DIAGNOSIS — C7952 Secondary malignant neoplasm of bone marrow: Secondary | ICD-10-CM | POA: Diagnosis present

## 2015-02-02 DIAGNOSIS — E039 Hypothyroidism, unspecified: Secondary | ICD-10-CM | POA: Diagnosis not present

## 2015-02-02 DIAGNOSIS — G709 Myoneural disorder, unspecified: Secondary | ICD-10-CM | POA: Diagnosis not present

## 2015-02-02 DIAGNOSIS — C787 Secondary malignant neoplasm of liver and intrahepatic bile duct: Secondary | ICD-10-CM | POA: Diagnosis not present

## 2015-02-02 DIAGNOSIS — Z510 Encounter for antineoplastic radiation therapy: Secondary | ICD-10-CM | POA: Diagnosis present

## 2015-02-02 DIAGNOSIS — C221 Intrahepatic bile duct carcinoma: Secondary | ICD-10-CM | POA: Diagnosis not present

## 2015-02-03 ENCOUNTER — Ambulatory Visit
Admission: RE | Admit: 2015-02-03 | Discharge: 2015-02-03 | Disposition: A | Discharge status: Home / Self Care | Payer: Medicare Other | Source: Ambulatory Visit | Attending: Radiation Oncology | Admitting: Radiation Oncology

## 2015-02-03 DIAGNOSIS — Z510 Encounter for antineoplastic radiation therapy: Secondary | ICD-10-CM | POA: Diagnosis not present

## 2015-02-04 ENCOUNTER — Ambulatory Visit
Admission: RE | Admit: 2015-02-04 | Discharge: 2015-02-04 | Disposition: A | Discharge status: Home / Self Care | Payer: Medicare Other | Source: Ambulatory Visit | Attending: Radiation Oncology | Admitting: Radiation Oncology

## 2015-02-04 DIAGNOSIS — Z510 Encounter for antineoplastic radiation therapy: Secondary | ICD-10-CM | POA: Diagnosis not present

## 2015-02-05 ENCOUNTER — Telehealth: Payer: Self-pay | Admitting: Oncology

## 2015-02-05 ENCOUNTER — Ambulatory Visit (HOSPITAL_BASED_OUTPATIENT_CLINIC_OR_DEPARTMENT_OTHER): Payer: Medicare Other | Admitting: Oncology

## 2015-02-05 ENCOUNTER — Other Ambulatory Visit (HOSPITAL_BASED_OUTPATIENT_CLINIC_OR_DEPARTMENT_OTHER): Payer: Medicare Other

## 2015-02-05 ENCOUNTER — Ambulatory Visit
Admission: RE | Admit: 2015-02-05 | Discharge: 2015-02-05 | Disposition: A | Discharge status: Home / Self Care | Payer: Medicare Other | Source: Ambulatory Visit | Attending: Radiation Oncology | Admitting: Radiation Oncology

## 2015-02-05 ENCOUNTER — Telehealth: Payer: Self-pay | Admitting: *Deleted

## 2015-02-05 ENCOUNTER — Other Ambulatory Visit: Payer: Self-pay | Admitting: Oncology

## 2015-02-05 VITALS — BP 115/58 | HR 105 | Temp 98.2°F | Resp 18 | Ht 66.0 in | Wt 162.7 lb

## 2015-02-05 DIAGNOSIS — C787 Secondary malignant neoplasm of liver and intrahepatic bile duct: Secondary | ICD-10-CM

## 2015-02-05 DIAGNOSIS — C7951 Secondary malignant neoplasm of bone: Secondary | ICD-10-CM

## 2015-02-05 DIAGNOSIS — K5900 Constipation, unspecified: Secondary | ICD-10-CM

## 2015-02-05 DIAGNOSIS — Z510 Encounter for antineoplastic radiation therapy: Secondary | ICD-10-CM | POA: Diagnosis not present

## 2015-02-05 DIAGNOSIS — C221 Intrahepatic bile duct carcinoma: Secondary | ICD-10-CM

## 2015-02-05 DIAGNOSIS — C801 Malignant (primary) neoplasm, unspecified: Secondary | ICD-10-CM

## 2015-02-05 DIAGNOSIS — C220 Liver cell carcinoma: Secondary | ICD-10-CM

## 2015-02-05 DIAGNOSIS — M542 Cervicalgia: Secondary | ICD-10-CM | POA: Diagnosis not present

## 2015-02-05 LAB — CBC WITH DIFFERENTIAL/PLATELET
BASO%: 0.2 % (ref 0.0–2.0)
Basophils Absolute: 0 10*3/uL (ref 0.0–0.1)
EOS ABS: 0.3 10*3/uL (ref 0.0–0.5)
EOS%: 2.2 % (ref 0.0–7.0)
HEMATOCRIT: 39.1 % (ref 34.8–46.6)
HGB: 12.7 g/dL (ref 11.6–15.9)
LYMPH#: 1.5 10*3/uL (ref 0.9–3.3)
LYMPH%: 11.8 % — AB (ref 14.0–49.7)
MCH: 32.1 pg (ref 25.1–34.0)
MCHC: 32.5 g/dL (ref 31.5–36.0)
MCV: 98.7 fL (ref 79.5–101.0)
MONO#: 1 10*3/uL — AB (ref 0.1–0.9)
MONO%: 8 % (ref 0.0–14.0)
NEUT%: 77.8 % — ABNORMAL HIGH (ref 38.4–76.8)
NEUTROS ABS: 10 10*3/uL — AB (ref 1.5–6.5)
PLATELETS: 257 10*3/uL (ref 145–400)
RBC: 3.96 10*6/uL (ref 3.70–5.45)
RDW: 15.4 % — ABNORMAL HIGH (ref 11.2–14.5)
WBC: 12.8 10*3/uL — AB (ref 3.9–10.3)

## 2015-02-05 LAB — COMPREHENSIVE METABOLIC PANEL (CC13)
ALBUMIN: 3.2 g/dL — AB (ref 3.5–5.0)
ALK PHOS: 116 U/L (ref 40–150)
ALT: 15 U/L (ref 0–55)
ANION GAP: 9 meq/L (ref 3–11)
AST: 38 U/L — ABNORMAL HIGH (ref 5–34)
BILIRUBIN TOTAL: 0.62 mg/dL (ref 0.20–1.20)
BUN: 12.2 mg/dL (ref 7.0–26.0)
CALCIUM: 12 mg/dL — AB (ref 8.4–10.4)
CO2: 27 mEq/L (ref 22–29)
CREATININE: 0.8 mg/dL (ref 0.6–1.1)
Chloride: 102 mEq/L (ref 98–109)
EGFR: 78 mL/min/{1.73_m2} — AB (ref >90)
Glucose: 110 mg/dl (ref 70–140)
Potassium: 3.6 mEq/L (ref 3.5–5.1)
Sodium: 137 mEq/L (ref 136–145)
TOTAL PROTEIN: 7.4 g/dL (ref 6.4–8.3)

## 2015-02-05 MED ORDER — ONDANSETRON HCL 8 MG PO TABS: 8.0000 mg | ORAL_TABLET | Freq: Three times a day (TID) | ORAL | Status: DC | PRN

## 2015-02-05 MED ORDER — HYDROCODONE-ACETAMINOPHEN 5-325 MG PO TABS: 1.0000 | ORAL_TABLET | Freq: Four times a day (QID) | ORAL | Status: DC | PRN

## 2015-02-05 NOTE — Progress Notes (Signed)
Her laboratory data and showed that mild hypercalcemia with calcium level of 12.0 an albumen of 3.2. This is likely contributing to her fatigue and anorexia. She would benefit from a Zometa infusion to prevent further worsening of her symptoms. We will set that up for her to be given on 02/06/2015.

## 2015-02-05 NOTE — Telephone Encounter (Signed)
-----   Message from Wyatt Portela, MD sent at 02/05/2015  1:41 PM EDT ----- Please call her to let her know that her calcium is high and maybe causing her to be more weak than usual.  I would like to give her Zometa on 10/7 before her radiation appointment.  POF sent and orders are in.

## 2015-02-05 NOTE — Telephone Encounter (Signed)
Called patient, explained that her calcium is high and she will  Need an iv infusion for zometa, tomorrow before her radiation appts. Spoke with [facility] in scheduling, she will contact ebony in managed care for prior approval for zometa. Then call patient with a date and time for tomorrow, before her radiation treatment appts.

## 2015-02-05 NOTE — Telephone Encounter (Signed)
Gave and printed appt sched and avs fo rpt for OCT °

## 2015-02-05 NOTE — Telephone Encounter (Signed)
Left message for patient re appointments for 10/7 and 10/20. Spoke with Managed Care re patient having zometa 10/7 - ok NPR

## 2015-02-05 NOTE — Progress Notes (Signed)
Hematology and Oncology Follow Up Visit  Lindsay Aguirre [id number] 01/08/1951 64 y.o. 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 This was confirmed by biopsy on 08/14/2013 and imaging studies showed multifocal hepatic lesions. She developed C7 metastatic lesion discovered in August 2016.   Prior Therapy:  Status post a liver biopsy on 08/14/2013. Gemcitabine at 1000 mg per meter squared started on 09/06/2013 given weekly 3 weeks on and one week off for 6 cycles. Subsequently she received it every other week until August 2016 when she developed progression of disease with C7 metastatic lesion. She status post surgical resection of her C7 metastatic lesion done in September 2016 at [facility].  Current therapy: Radiation therapy to the cervical spine to be completed on 02/11/2015.   Interim History: Lindsay Aguirre presents for follow up with a friend. 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 She underwent surgical resection at [facility] on 01/02/2015. The pathology confirmed the presence of metastatic adenocarcinoma. She recovered well from the operation and currently receiving adjuvant radiation therapy.   Her energy is improving slowly but she is still quite fatigued. She is able to ambulate short distances but does require a walker and wheelchair for long distances. She does report some residual neck pain but this is improving at this time. Has not reported any radicular pain or any neurological deficits. Has not reported any loss of strength or numbness or tingling. Her appetite is improving but have lost weight.  She reports no fever, chills, shortness of breath chest pain, night sweats or diarrhea.  Has not  reported any hematochezia or melena. She continues to perform her activities of daily living without any decline. She has not reported any syncope or neurological changes. She has not reported any arthralgias or myalgias. Did not report any skin rashes or lesions. Did not report any lymphadenopathy or petechiae.   Remainder of her review of system is unremarkable.  Medications: I have reviewed the patient's current medications. Unchanged by my review. Current Outpatient Prescriptions  Medication Sig Dispense Refill  . albuterol (PROVENTIL HFA;VENTOLIN HFA) 108 (90 BASE) MCG/ACT inhaler Inhale 2 puffs into the lungs every 6 (six) hours as needed for wheezing or shortness of breath. 1 Inhaler 0  . allopurinol (ZYLOPRIM) 300 MG tablet TAKE 1 TABLET (300 MG TOTAL) BY MOUTH DAILY. 30 tablet 2  . benzonatate (TESSALON) 200 MG capsule Take 1 capsule (200 mg total) by mouth 2 (two) times daily as needed for cough. 20 capsule 0  . bifidobacterium infantis (ALIGN) capsule Take 1 capsule by mouth daily.    [name] BIOTIN PO Take 1 tablet by mouth daily.    . Cholecalciferol (VITAMIN D) 2000 UNITS tablet Take 2,000 Units by mouth daily.    [name] emollient (BIAFINE) cream Apply 1 application topically daily.    [name] HYDROcodone-acetaminophen (NORCO/VICODIN) 5-325 MG tablet Take 1 tablet by mouth every 6 (six) hours as needed for moderate pain. 60 tablet 0  . levofloxacin (LEVAQUIN) 750 MG tablet Take 1 tablet (750 mg total) by mouth daily. 14 tablet 0  . lidocaine-prilocaine (EMLA) cream Apply 1 application topically as needed. Apply cream, on days of treatment, to [facility] site 1-2 hours prior to treatment. 30 g 0  . lisinopril-hydrochlorothiazide (PRINZIDE,ZESTORETIC) 20-12.5 MG per tablet  Take 1 tablet by mouth every evening. 30 tablet 5  . ondansetron (ZOFRAN) 8 MG tablet Take 1 tablet (8 mg total) by mouth every 8 (eight) hours as needed for nausea or vomiting. 30 tablet 1  . ondansetron (ZOFRAN) 8 MG tablet Take 1 tablet  (8 mg total) by mouth every 8 (eight) hours as needed for nausea or vomiting. 30 tablet 1  . polyethylene glycol powder (GLYCOLAX/MIRALAX) powder TAKE 1 CONTAINER TOTAL ONCE AS DIRECTED 255 g 1  . prochlorperazine (COMPAZINE) 10 MG tablet Take 1 tablet (10 mg total) by mouth every 6 (six) hours as needed for nausea or vomiting. 30 tablet 1  . senna (SENOKOT) 8.6 MG TABS tablet Take 2 tablets (17.2 mg total) by mouth daily. 60 each 0  . SYNTHROID 88 MCG tablet TAKE 1 TABLET (88 MCG TOTAL) BY MOUTH DAILY BEFORE BREAKFAST. 30 tablet 0  . vitamin C (ASCORBIC ACID) 500 MG tablet Take 500 mg by mouth daily.     No current facility-administered medications for this visit.     Allergies:  Allergies  Allergen Reactions  . Livalo [Pitavastatin]     Leg cramps  . Simvastatin     Leg cramps  . Statins     Leg Cramps     Past Medical History, Surgical history, Social history, and Family History were reviewed and updated.  Physical Exam: Blood pressure 115/58, pulse 105, temperature 98.2 F (36.8 C), temperature source Oral, resp. rate 18, height 5\' 6"  (1.676 m), weight 162 lb 11.2 oz (73.8 kg), SpO2 97 %. ECOG: 1 General appearance: alert and cooperative medically ill appearing woman without distress today. Head: Normocephalic, without obvious abnormality Neck: no adenopathy Lymph nodes: Cervical, supraclavicular, and axillary nodes normal. Heart:regular rate and rhythm, S1, S2.  Lung:chest clear, no wheezing. Dullness to percussion. Abdomin: soft, non-tender, without masses or organomegaly no rebound or guarding. EXT:no erythema, induration, or edema Skin: Slight ecchymosis noted on the upper extremities. Neurologically: No deficits with good upper extremity strength.   Lab Results: Lab Results  Component Value Date   WBC 12.8* 02/05/2015   HGB 12.7 02/05/2015   HCT 39.1 02/05/2015   MCV 98.7 02/05/2015   PLT 257 02/05/2015     Chemistry      Component Value Date/Time   NA 140  12/18/2014 1148   NA 141 07/10/2014 1306   NA 140 01/16/2014 1302   K 3.5 12/18/2014 1148   K 4.1 07/10/2014 1306   CL 98 07/10/2014 1306   CO2 25 12/18/2014 1148   CO2 24 07/10/2014 1306   BUN 12.8 12/18/2014 1148   BUN 16 07/10/2014 1306   BUN 10 01/16/2014 1302   CREATININE 0.8 12/18/2014 1148   CREATININE 0.79 07/10/2014 1306   CREATININE 0.81 10/24/2012 1203      Component Value Date/Time   CALCIUM 10.1 12/18/2014 1148   CALCIUM 10.1 07/10/2014 1306   ALKPHOS 95 12/18/2014 1148   ALKPHOS 83 07/10/2014 1306   AST 26 12/18/2014 1148   AST 35 07/10/2014 1306   ALT 22 12/18/2014 1148   ALT 32 07/10/2014 1306   BILITOT 0.57 12/18/2014 1148   BILITOT 0.4 07/10/2014 1306   BILITOT 0.5 01/16/2014 332       64 year old woman with the following issues:  1. 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 She is on chemotherapy with gemcitabine 1000 mg day 1 and 15 of a 28 day cycle.    CT scan from 10/07/2014 showed  mild progression of her disease. She have elected to continue with gemcitabine for better tolerance.   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 The plan is to restage her with CT scan chest abdomen and pelvis and determine the extent of disease. She will likely require different salvage therapy. I will be in the form of Xeloda or a platinum regimen. The start of chemotherapy need to be delayed until her recovery is complete at this time.  2. IV access: Port-A-Cath in place without any complications.   3. Antiemetics: Continue Zofran as needed. This was refilled today.  4. Constipation: She uses MiraLax with excellent response. Continue to encourage that especially with pain medication.  5. C7 metastatic lesion: Status post surgical resection and postoperative radiation therapy.  6. Followup: Every 2 weeks after CT scan to assess next treatment options.  Zola Button,  MD 10/6/201612:53 PM

## 2015-02-06 ENCOUNTER — Encounter: Payer: Self-pay | Admitting: Radiation Oncology

## 2015-02-06 ENCOUNTER — Ambulatory Visit (HOSPITAL_BASED_OUTPATIENT_CLINIC_OR_DEPARTMENT_OTHER): Payer: Medicare Other

## 2015-02-06 ENCOUNTER — Ambulatory Visit
Admission: RE | Admit: 2015-02-06 | Discharge: 2015-02-06 | Disposition: A | Discharge status: Home / Self Care | Payer: Medicare Other | Source: Ambulatory Visit | Attending: Radiation Oncology | Admitting: Radiation Oncology

## 2015-02-06 VITALS — BP 127/66 | HR 102 | Temp 98.0°F

## 2015-02-06 VITALS — BP 122/68 | HR 93 | Temp 98.2°F | Resp 12 | Ht 66.0 in | Wt 163.7 lb

## 2015-02-06 DIAGNOSIS — C7951 Secondary malignant neoplasm of bone: Secondary | ICD-10-CM

## 2015-02-06 DIAGNOSIS — Z510 Encounter for antineoplastic radiation therapy: Secondary | ICD-10-CM | POA: Diagnosis not present

## 2015-02-06 DIAGNOSIS — C801 Malignant (primary) neoplasm, unspecified: Secondary | ICD-10-CM | POA: Diagnosis present

## 2015-02-06 DIAGNOSIS — C787 Secondary malignant neoplasm of liver and intrahepatic bile duct: Secondary | ICD-10-CM

## 2015-02-06 MED ORDER — ZOLEDRONIC ACID 4 MG/100ML IV SOLN
4.0000 mg | Freq: Once | INTRAVENOUS | Status: AC
  Administered 2015-02-06: 4 mg via INTRAVENOUS
  Filled 2015-02-06: qty 100

## 2015-02-06 MED ORDER — SODIUM CHLORIDE 0.9 % IV SOLN
Freq: Once | INTRAVENOUS | Status: AC
  Administered 2015-02-06: 14:00:00 via INTRAVENOUS

## 2015-02-06 MED ORDER — SODIUM CHLORIDE 0.9 % IJ SOLN
10.0000 mL | INTRAMUSCULAR | Status: DC | PRN
  Administered 2015-02-06: 10 mL
  Filled 2015-02-06: qty 10

## 2015-02-06 MED ORDER — HEPARIN SOD (PORK) LOCK FLUSH 100 UNIT/ML IV SOLN
500.0000 [IU] | Freq: Once | INTRAVENOUS | Status: AC | PRN
  Administered 2015-02-06: 500 [IU]
  Filled 2015-02-06: qty 5

## 2015-02-06 NOTE — Patient Instructions (Signed)

## 2015-02-06 NOTE — Progress Notes (Signed)
Department of Radiation Oncology  Phone:  [phone] Fax:        [phone]  Weekly Treatment Note    Name: Lindsay Aguirre Date: 02/06/2015 MRN: [id number] DOB: [birth year]   Current dose: 27 Gy  Current fraction:7   MEDICATIONS: Current Outpatient Prescriptions  Medication Sig Dispense Refill  . albuterol (PROVENTIL HFA;VENTOLIN HFA) 108 (90 BASE) MCG/ACT inhaler Inhale 2 puffs into the lungs every 6 (six) hours as needed for wheezing or shortness of breath. 1 Inhaler 0  . allopurinol (ZYLOPRIM) 300 MG tablet TAKE 1 TABLET (300 MG TOTAL) BY MOUTH DAILY. 30 tablet 2  . bifidobacterium infantis (ALIGN) capsule Take 1 capsule by mouth daily.    [name] BIOTIN PO Take 1 tablet by mouth daily.    . Cholecalciferol (VITAMIN D) 2000 UNITS tablet Take 2,000 Units by mouth daily.    [name] emollient (BIAFINE) cream Apply 1 application topically daily.    [name] HYDROcodone-acetaminophen (NORCO/VICODIN) 5-325 MG tablet Take 1 tablet by mouth every 6 (six) hours as needed for moderate pain. 60 tablet 0  . lidocaine-prilocaine (EMLA) cream Apply 1 application topically as needed. Apply cream, on days of treatment, to [facility] site 1-2 hours prior to treatment. 30 g 0  . lisinopril-hydrochlorothiazide (PRINZIDE,ZESTORETIC) 20-12.5 MG per tablet Take 1 tablet by mouth every evening. 30 tablet 5  . ondansetron (ZOFRAN) 8 MG tablet Take 1 tablet (8 mg total) by mouth every 8 (eight) hours as needed for nausea or vomiting. 30 tablet 1  . ondansetron (ZOFRAN) 8 MG tablet Take 1 tablet (8 mg total) by mouth every 8 (eight) hours as needed for nausea or vomiting. 30 tablet 1  . polyethylene glycol powder (GLYCOLAX/MIRALAX) powder TAKE 1 CONTAINER TOTAL ONCE AS DIRECTED 255 g 1  . prochlorperazine (COMPAZINE) 10 MG tablet Take 1 tablet (10 mg total) by mouth every 6 (six) hours as needed for nausea or vomiting. 30 tablet 1  . senna (SENOKOT) 8.6 MG TABS tablet Take 2 tablets (17.2 mg total) by mouth daily. 60  each 0  . SYNTHROID 88 MCG tablet TAKE 1 TABLET (88 MCG TOTAL) BY MOUTH DAILY BEFORE BREAKFAST. 30 tablet 0  . vitamin C (ASCORBIC ACID) 500 MG tablet Take 500 mg by mouth daily.    . benzonatate (TESSALON) 200 MG capsule Take 1 capsule (200 mg total) by mouth 2 (two) times daily as needed for cough. (Patient not taking: Reported on 02/06/2015) 20 capsule 0  . levofloxacin (LEVAQUIN) 750 MG tablet Take 1 tablet (750 mg total) by mouth daily. (Patient not taking: Reported on 02/06/2015) 14 tablet 0   No current facility-administered medications for this encounter.   Facility-Administered Medications Ordered in Other Encounters  Medication Dose Route Frequency Provider Last Rate Last Dose  . sodium chloride 0.9 % injection 10 mL  10 mL Intracatheter PRN Wyatt Portela, MD   10 mL at 02/06/15 1442     ALLERGIES: Livalo; Simvastatin; and Statins   LABORATORY DATA:  Lab Results  Component Value Date   WBC 12.8* 02/05/2015   HGB 12.7 02/05/2015   HCT 39.1 02/05/2015   MCV 98.7 02/05/2015   PLT 257 02/05/2015   Lab Results  Component Value Date   NA 137 02/05/2015   K 3.6 02/05/2015   CL 98 07/10/2014   CO2 27 02/05/2015   Lab Results  Component Value Date   ALT 15 02/05/2015   AST 38* 02/05/2015   ALKPHOS 116 02/05/2015   BILITOT 0.62 02/05/2015  NARRATIVE: Lindsay Aguirre was seen today for weekly treatment management. The chart was checked and the patient's films were reviewed.  Lindsay Aguirre has completed 7 fractions to her C5-T1 spine.  She reports new sharp pains in her posterior right shoulder that radiate down her right back.  She said they started last night.  She denies trouble swallowing or sore throat.  She is in a wheelchair today but reports walking at home.  She is using biafine.  BP 122/68 mmHg  Pulse 93  Temp(Src) 98.2 F (36.8 C) (Oral)  Resp 12  Ht 5\' 6"  (1.676 m)  Wt 163 lb 11.2 oz (74.254 kg)  BMI 26.43 kg/m2  SpO2 98%  PHYSICAL EXAMINATION:  height is 5\' 6"  (1.676 m) and weight is 163 lb 11.2 oz (74.254 kg). Her oral temperature is 98.2 F (36.8 C). Her blood pressure is 122/68 and her pulse is 93. Her respiration is 12 and oxygen saturation is 98%.      No significant skin change in the treatment area  ASSESSMENT: The patient is doing satisfactorily with treatment.  PLAN: We will continue with the patient's radiation treatment as planned.

## 2015-02-06 NOTE — Progress Notes (Signed)
Lindsay Aguirre has completed 7 fractions to her C5-T1 spine.  She reports new sharp pains in her posterior right shoulder that radiate down her right back.  She said they started last night.  She denies trouble swallowing or sore throat.  She is in a wheelchair today but reports walking at home.  She is using biafine.  BP 122/68 mmHg  Pulse 93  Temp(Src) 98.2 F (36.8 C) (Oral)  Resp 12  Ht 5\' 6"  (1.676 m)  Wt 163 lb 11.2 oz (74.254 kg)  BMI 26.43 kg/m2  SpO2 98%

## 2015-02-09 ENCOUNTER — Ambulatory Visit
Admission: RE | Admit: 2015-02-09 | Discharge: 2015-02-09 | Disposition: A | Discharge status: Home / Self Care | Payer: Medicare Other | Source: Ambulatory Visit | Attending: Radiation Oncology | Admitting: Radiation Oncology

## 2015-02-09 DIAGNOSIS — Z510 Encounter for antineoplastic radiation therapy: Secondary | ICD-10-CM | POA: Diagnosis not present

## 2015-02-10 ENCOUNTER — Ambulatory Visit
Admission: RE | Admit: 2015-02-10 | Discharge: 2015-02-10 | Disposition: A | Discharge status: Home / Self Care | Payer: Medicare Other | Source: Ambulatory Visit | Attending: Radiation Oncology | Admitting: Radiation Oncology

## 2015-02-10 DIAGNOSIS — Z510 Encounter for antineoplastic radiation therapy: Secondary | ICD-10-CM | POA: Diagnosis not present

## 2015-02-11 ENCOUNTER — Ambulatory Visit
Admission: RE | Admit: 2015-02-11 | Discharge: 2015-02-11 | Disposition: A | Discharge status: Home / Self Care | Payer: Medicare Other | Source: Ambulatory Visit | Attending: Radiation Oncology | Admitting: Radiation Oncology

## 2015-02-11 ENCOUNTER — Encounter: Payer: Self-pay | Admitting: Radiation Oncology

## 2015-02-11 VITALS — BP 106/61 | HR 94 | Temp 98.3°F | Resp 20 | Ht 66.0 in | Wt 165.0 lb

## 2015-02-11 DIAGNOSIS — C7951 Secondary malignant neoplasm of bone: Secondary | ICD-10-CM

## 2015-02-11 DIAGNOSIS — Z510 Encounter for antineoplastic radiation therapy: Secondary | ICD-10-CM | POA: Diagnosis not present

## 2015-02-11 NOTE — Progress Notes (Signed)
Lindsay Aguirre has completed treatment with 10 fractions to her C5-T1 spine.  Lindsay Aguirre denies pain.  Lindsay Aguirre reports having a sore throat and feels like food gets stuck in her throat.  This started a few days ago and Lindsay Aguirre is eating softer foods now.  The skin on her neck/upper back is intact.  Lindsay Aguirre is using biafine.  Lindsay Aguirre denies fatigue.  Lindsay Aguirre has been given a one month follow up appointment card. Lindsay Aguirre is in a wheelchair today but states Lindsay Aguirre walks at home.  BP 106/61 mmHg  Pulse 94  Temp(Src) 98.3 F (36.8 C) (Oral)  Resp 20  Ht 5\' 6"  (1.676 m)  Wt 165 lb (74.844 kg)  BMI 26.64 kg/m2  SpO2 97%

## 2015-02-11 NOTE — Progress Notes (Signed)
Department of Radiation Oncology  Phone:  [phone] Fax:        [phone]  Weekly Treatment Note    Name: Lindsay Aguirre Date: 02/11/2015 MRN: [id number] DOB: 1951/05/01   Current dose: 30 Gy  Current fraction:10   MEDICATIONS: Current Outpatient Prescriptions  Medication Sig Dispense Refill  . albuterol (PROVENTIL HFA;VENTOLIN HFA) 108 (90 BASE) MCG/ACT inhaler Inhale 2 puffs into the lungs every 6 (six) hours as needed for wheezing or shortness of breath. 1 Inhaler 0  . allopurinol (ZYLOPRIM) 300 MG tablet TAKE 1 TABLET (300 MG TOTAL) BY MOUTH DAILY. 30 tablet 2  . bifidobacterium infantis (ALIGN) capsule Take 1 capsule by mouth daily.    [name] BIOTIN PO Take 1 tablet by mouth daily.    . Cholecalciferol (VITAMIN D) 2000 UNITS tablet Take 2,000 Units by mouth daily.    [name] emollient (BIAFINE) cream Apply 1 application topically daily.    [name] lidocaine-prilocaine (EMLA) cream Apply 1 application topically as needed. Apply cream, on days of treatment, to [facility] site 1-2 hours prior to treatment. 30 g 0  . lisinopril-hydrochlorothiazide (PRINZIDE,ZESTORETIC) 20-12.5 MG per tablet Take 1 tablet by mouth every evening. 30 tablet 5  . ondansetron (ZOFRAN) 8 MG tablet Take 1 tablet (8 mg total) by mouth every 8 (eight) hours as needed for nausea or vomiting. 30 tablet 1  . polyethylene glycol powder (GLYCOLAX/MIRALAX) powder TAKE 1 CONTAINER TOTAL ONCE AS DIRECTED 255 g 1  . senna (SENOKOT) 8.6 MG TABS tablet Take 2 tablets (17.2 mg total) by mouth daily. 60 each 0  . SYNTHROID 88 MCG tablet TAKE 1 TABLET (88 MCG TOTAL) BY MOUTH DAILY BEFORE BREAKFAST. 30 tablet 0  . vitamin C (ASCORBIC ACID) 500 MG tablet Take 500 mg by mouth daily.    . benzonatate (TESSALON) 200 MG capsule Take 1 capsule (200 mg total) by mouth 2 (two) times daily as needed for cough. (Patient not taking: Reported on 02/06/2015) 20 capsule 0  . HYDROcodone-acetaminophen (NORCO/VICODIN) 5-325 MG tablet Take 1 tablet  by mouth every 6 (six) hours as needed for moderate pain. (Patient not taking: Reported on 02/11/2015) 60 tablet 0  . levofloxacin (LEVAQUIN) 750 MG tablet Take 1 tablet (750 mg total) by mouth daily. (Patient not taking: Reported on 02/06/2015) 14 tablet 0  . ondansetron (ZOFRAN) 8 MG tablet Take 1 tablet (8 mg total) by mouth every 8 (eight) hours as needed for nausea or vomiting. 30 tablet 1  . prochlorperazine (COMPAZINE) 10 MG tablet Take 1 tablet (10 mg total) by mouth every 6 (six) hours as needed for nausea or vomiting. (Patient not taking: Reported on 02/11/2015) 30 tablet 1   No current facility-administered medications for this encounter.     ALLERGIES: Livalo; Simvastatin; and Statins   LABORATORY DATA:  Lab Results  Component Value Date   WBC 12.8* 02/05/2015   HGB 12.7 02/05/2015   HCT 39.1 02/05/2015   MCV 98.7 02/05/2015   PLT 257 02/05/2015   Lab Results  Component Value Date   NA 137 02/05/2015   K 3.6 02/05/2015   CL 98 07/10/2014   CO2 27 02/05/2015   Lab Results  Component Value Date   ALT 15 02/05/2015   AST 38* 02/05/2015   ALKPHOS 116 02/05/2015   BILITOT 0.62 02/05/2015     NARRATIVE: Lindsay Aguirre was seen today for weekly treatment management. The chart was checked and the patient's films were reviewed.  Lindsay Aguirre has completed treatment with  10 fractions to her C5-T1 spine.  She denies pain.  She reports having a sore throat and feels like food gets stuck in her throat.  This started a few days ago and she is eating softer foods now.  The skin on her neck/upper back is intact.  She is using biafine.  She denies fatigue.  She has been given a one month follow up appointment card. She is in a wheelchair today but states she walks at home.  BP 106/61 mmHg  Pulse 94  Temp(Src) 98.3 F (36.8 C) (Oral)  Resp 20  Ht 5\' 6"  (1.676 m)  Wt 165 lb (74.844 kg)  BMI 26.64 kg/m2  SpO2 97%  PHYSICAL EXAMINATION: height is 5\' 6"  (1.676 m) and  weight is 165 lb (74.844 kg). Her oral temperature is 98.3 F (36.8 C). Her blood pressure is 106/61 and her pulse is 94. Her respiration is 20 and oxygen saturation is 97%.        ASSESSMENT: The patient is doing satisfactorily with treatment.  PLAN: We will continue with the patient's radiation treatment as planned. We discussed a possible prescription for Magic mouthwash.She would like to hold off on this currently but will think about this and let us know if she does a prescription for this.

## 2015-02-13 ENCOUNTER — Telehealth: Payer: Self-pay | Admitting: *Deleted

## 2015-02-13 NOTE — Telephone Encounter (Signed)
Voicemail: "She's having trouble swallowing, her throat hurts and it;s running to her ear.  Can we get something for her to take."

## 2015-02-13 NOTE — Telephone Encounter (Signed)
This RN returned patient's phone call but there was no way to leave a message. Phone just rang.

## 2015-02-14 ENCOUNTER — Encounter (HOSPITAL_COMMUNITY): Payer: Self-pay

## 2015-02-14 ENCOUNTER — Emergency Department (HOSPITAL_COMMUNITY)
Admission: EM | Admit: 2015-02-14 | Discharge: 2015-02-14 | Disposition: A | Discharge status: Home / Self Care | Payer: Medicare Other | Attending: Emergency Medicine | Admitting: Emergency Medicine

## 2015-02-14 DIAGNOSIS — E860 Dehydration: Secondary | ICD-10-CM | POA: Diagnosis not present

## 2015-02-14 DIAGNOSIS — Z8509 Personal history of malignant neoplasm of other digestive organs: Secondary | ICD-10-CM | POA: Insufficient documentation

## 2015-02-14 DIAGNOSIS — E039 Hypothyroidism, unspecified: Secondary | ICD-10-CM | POA: Insufficient documentation

## 2015-02-14 DIAGNOSIS — M109 Gout, unspecified: Secondary | ICD-10-CM | POA: Diagnosis not present

## 2015-02-14 DIAGNOSIS — I10 Essential (primary) hypertension: Secondary | ICD-10-CM | POA: Diagnosis not present

## 2015-02-14 DIAGNOSIS — Z8669 Personal history of other diseases of the nervous system and sense organs: Secondary | ICD-10-CM | POA: Insufficient documentation

## 2015-02-14 DIAGNOSIS — R110 Nausea: Secondary | ICD-10-CM | POA: Diagnosis not present

## 2015-02-14 DIAGNOSIS — J029 Acute pharyngitis, unspecified: Secondary | ICD-10-CM | POA: Diagnosis present

## 2015-02-14 DIAGNOSIS — Z79899 Other long term (current) drug therapy: Secondary | ICD-10-CM | POA: Insufficient documentation

## 2015-02-14 LAB — CBC WITH DIFFERENTIAL/PLATELET
BASOS PCT: 0 %
Basophils Absolute: 0 10*3/uL (ref 0.0–0.1)
Eosinophils Absolute: 0.2 10*3/uL (ref 0.0–0.7)
Eosinophils Relative: 2 %
HEMATOCRIT: 36.4 % (ref 36.0–46.0)
HEMOGLOBIN: 11.8 g/dL — AB (ref 12.0–15.0)
LYMPHS ABS: 1.2 10*3/uL (ref 0.7–4.0)
Lymphocytes Relative: 11 %
MCH: 31.6 pg (ref 26.0–34.0)
MCHC: 32.4 g/dL (ref 30.0–36.0)
MCV: 97.6 fL (ref 78.0–100.0)
MONOS PCT: 10 %
Monocytes Absolute: 1.1 10*3/uL — ABNORMAL HIGH (ref 0.1–1.0)
NEUTROS ABS: 9 10*3/uL — AB (ref 1.7–7.7)
NEUTROS PCT: 77 %
Platelets: 232 10*3/uL (ref 150–400)
RBC: 3.73 MIL/uL — AB (ref 3.87–5.11)
RDW: 15.4 % (ref 11.5–15.5)
WBC: 11.5 10*3/uL — AB (ref 4.0–10.5)

## 2015-02-14 LAB — BASIC METABOLIC PANEL
Anion gap: 9 (ref 5–15)
BUN: 13 mg/dL (ref 6–20)
CHLORIDE: 102 mmol/L (ref 101–111)
CO2: 25 mmol/L (ref 22–32)
CREATININE: 0.71 mg/dL (ref 0.44–1.00)
Calcium: 10 mg/dL (ref 8.9–10.3)
GFR calc Af Amer: >60 mL/min (ref >60)
GFR calc non Af Amer: >60 mL/min (ref >60)
Glucose, Bld: 105 mg/dL — ABNORMAL HIGH (ref 65–99)
Potassium: 3.5 mmol/L (ref 3.5–5.1)
SODIUM: 136 mmol/L (ref 135–145)

## 2015-02-14 MED ORDER — HEPARIN SOD (PORK) LOCK FLUSH 100 UNIT/ML IV SOLN
500.0000 [IU] | Freq: Once | INTRAVENOUS | Status: AC
  Administered 2015-02-14: 500 [IU]
  Filled 2015-02-14: qty 5

## 2015-02-14 MED ORDER — SODIUM CHLORIDE 0.9 % IV SOLN
INTRAVENOUS | Status: DC
  Administered 2015-02-14: 21:00:00 via INTRAVENOUS

## 2015-02-14 MED ORDER — SODIUM CHLORIDE 0.9 % IV BOLUS (SEPSIS)
1000.0000 mL | Freq: Once | INTRAVENOUS | Status: AC
Start: 2015-02-14 — End: 2015-02-14
  Administered 2015-02-14: 1000 mL via INTRAVENOUS

## 2015-02-14 NOTE — ED Notes (Signed)
She states she underwent a final radiation treatment for cancer on Wed. Of this week.  Ever since Wed. She has been nauseated with a very sore throat which has caused markedly decreased oral intake.  She is in no distress.

## 2015-02-14 NOTE — ED Notes (Signed)
Family at bedside. 

## 2015-02-14 NOTE — ED Notes (Signed)
Nurse will draw labs from port. 

## 2015-02-14 NOTE — Discharge Instructions (Signed)

## 2015-02-14 NOTE — ED Provider Notes (Signed)
CSN: [id number]     Arrival date & time 02/14/15  1843 History   First MD Initiated Contact with Patient 02/14/15 1859     Chief Complaint  Patient presents with  . Sore Throat     (Consider location/radiation/quality/duration/timing/severity/associated sxs/prior Treatment) HPI Comments: Patient here complaining of sore throat and nausea times several days. She has had decreased oral intake. Has had emesis 1 but denies any fever. 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 Has had some weakness but denies any syncope or near-syncope. Endorses anorexia. Called her cancer doctor and told to come here. No treatment used prior to arrival  Patient is a 64 y.o. female presenting with pharyngitis. The history is provided by the patient.  Sore Throat    Past Medical History  Diagnosis Date  . PONV (postoperative nausea and vomiting)   . Hypertension   . Hypothyroidism   . Arthritis   . Gout   . Neuromuscular disorder ([place])   . Hyperlipidemia   . Family history of anesthesia complication     '" MY OLDEST SON [place] ME "  . 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 Cervical fusion  '99 & '03  . Rotator cuff surgery  '08    right  . Cataract extraction w/phaco  12/13/2010    Procedure: CATARACT EXTRACTION PHACO AND INTRAOCULAR LENS PLACEMENT (IOC);  Surgeon: Tonny Branch;  Location: [facility];  Service: Ophthalmology;  Laterality: Right;  CDE: 8.94  . Eye surgery    . Back surgery      2009  . Back surgery  2011    lumbar surgery  . Tubal ligation  1979  . Rotator cuff repair Left 04/09/2013    DR Marlou Sa  . Shoulder arthroscopy with subacromial decompression and open rotator c Left 04/09/2013    Procedure: SHOULDER ARTHROSCOPY WITH SUBACROMIAL DECOMPRESSION AND MINI OPEN ROTATOR CUFF REPAIR, POSSIBLE BICEP TENDONODESIS,  ;  Surgeon: Meredith Pel, MD;  Location: [facility];  Service: Orthopedics;   Laterality: Left;   Family History  Problem Relation Age of Onset  . Anesthesia problems Neg Hx   . Hypotension Neg Hx   . Malignant hyperthermia Neg Hx   . Pseudochol deficiency Neg Hx   . Diabetes Sister   . Stroke Mother   . Heart disease Father   . Stroke Father   . Diabetes Brother   . Diabetes Sister   . Diabetes Sister   . Diabetes Sister    Social History  Substance Use Topics  . Smoking status: Never Smoker   . Smokeless tobacco: Never Used  . Alcohol Use: No   OB History    No data available     Review of Systems  All other systems reviewed and are negative.     Allergies  Livalo; Simvastatin; and Statins  Home Medications   Prior to Admission medications   Medication Sig Start Date End Date Taking? Authorizing Provider  albuterol (PROVENTIL HFA;VENTOLIN HFA) 108 (90 BASE) MCG/ACT inhaler Inhale 2 puffs into the lungs every 6 (six) hours as needed for wheezing or shortness of breath. 01/14/15   Fransisca Kaufmann Dettinger, MD  allopurinol (ZYLOPRIM) 300 MG tablet TAKE 1 TABLET (300 MG TOTAL) BY MOUTH DAILY. 12/16/14   [name] Hassell Done, FNP  benzonatate (TESSALON) 200 MG capsule Take 1 capsule (200 mg total) by mouth 2 (two) times daily as needed for cough. Patient not taking: Reported  on 02/06/2015 01/14/15   Fransisca Kaufmann Dettinger, MD  bifidobacterium infantis (ALIGN) capsule Take 1 capsule by mouth daily.    Historical Provider, MD  BIOTIN PO Take 1 tablet by mouth daily.    Historical Provider, MD  Cholecalciferol (VITAMIN D) 2000 UNITS tablet Take 2,000 Units by mouth daily.    Historical Provider, MD  emollient (BIAFINE) cream Apply 1 application topically daily.    Historical Provider, MD  HYDROcodone-acetaminophen (NORCO/VICODIN) 5-325 MG tablet Take 1 tablet by mouth every 6 (six) hours as needed for moderate pain. Patient not taking: Reported on 02/11/2015 02/05/15   Wyatt Portela, MD  levofloxacin (LEVAQUIN) 750 MG tablet Take 1 tablet (750 mg total) by mouth  daily. Patient not taking: Reported on 02/06/2015 01/14/15   Fransisca Kaufmann Dettinger, MD  lidocaine-prilocaine (EMLA) cream Apply 1 application topically as needed. Apply cream, on days of treatment, to [facility] site 1-2 hours prior to treatment. 08/27/13   Wyatt Portela, MD  lisinopril-hydrochlorothiazide (PRINZIDE,ZESTORETIC) 20-12.5 MG per tablet Take 1 tablet by mouth every evening. 07/10/14   [name] Hassell Done, FNP  ondansetron (ZOFRAN) 8 MG tablet Take 1 tablet (8 mg total) by mouth every 8 (eight) hours as needed for nausea or vomiting. 02/05/15   Wyatt Portela, MD  ondansetron (ZOFRAN) 8 MG tablet Take 1 tablet (8 mg total) by mouth every 8 (eight) hours as needed for nausea or vomiting. 02/05/15   Wyatt Portela, MD  polyethylene glycol powder (GLYCOLAX/MIRALAX) powder TAKE 1 CONTAINER TOTAL ONCE AS DIRECTED 10/09/14   Wyatt Portela, MD  prochlorperazine (COMPAZINE) 10 MG tablet Take 1 tablet (10 mg total) by mouth every 6 (six) hours as needed for nausea or vomiting. Patient not taking: Reported on 02/11/2015 07/03/14   Burnetta Sabin E Johnson, PA-C  senna (SENOKOT) 8.6 MG TABS tablet Take 2 tablets (17.2 mg total) by mouth daily. 09/20/13   Wyatt Portela, MD  SYNTHROID 88 MCG tablet TAKE 1 TABLET (88 MCG TOTAL) BY MOUTH DAILY BEFORE BREAKFAST. 01/21/15   [name] Hassell Done, FNP  vitamin C (ASCORBIC ACID) 500 MG tablet Take 500 mg by mouth daily.    Historical Provider, MD   BP 108/58 mmHg  Pulse 96  Temp(Src) 98 F (36.7 C) (Oral)  Resp 16  SpO2 95% Physical Exam  Constitutional: She is oriented to person, place, and time. She appears well-developed and well-nourished.  Non-toxic appearance. No distress.  HENT:  Head: Normocephalic and atraumatic.  Eyes: Conjunctivae, EOM and lids are normal. Pupils are equal, round, and reactive to light.  Neck: Normal range of motion. Neck supple. No tracheal deviation present. No thyroid mass present.  Cardiovascular: Normal rate, regular rhythm and normal heart  sounds.  Exam reveals no gallop.   No murmur heard. Pulmonary/Chest: Effort normal and breath sounds normal. No stridor. No respiratory distress. She has no decreased breath sounds. She has no wheezes. She has no rhonchi. She has no rales.  Abdominal: Soft. Normal appearance and bowel sounds are normal. She exhibits no distension. There is no tenderness. There is no rebound and no CVA tenderness.  Musculoskeletal: Normal range of motion. She exhibits no edema or tenderness.  Neurological: She is alert and oriented to person, place, and time. She has normal strength. No cranial nerve deficit or sensory deficit. GCS eye subscore is 4. GCS verbal subscore is 5. GCS motor subscore is 6.  Skin: Skin is warm and dry. No abrasion and no rash noted.  Psychiatric: She has a normal  mood and affect. Her speech is normal and behavior is normal.  Nursing note and vitals reviewed.   ED Course  Procedures (including critical care time) Labs Review Labs Reviewed  CBC WITH DIFFERENTIAL/PLATELET  BASIC METABOLIC PANEL    Imaging Review No results found. I have personally reviewed and evaluated these images and lab results as part of my medical decision-making.   EKG Interpretation None      MDM   Final diagnoses:  None    Patient given IV fluids and feels much better. Suspect that she was dehydrated. Patient to follow-up with her doctor at the [facility] next week return precautions given    Lacretia Leigh, MD 02/14/15 2153

## 2015-02-16 ENCOUNTER — Encounter: Payer: Self-pay | Admitting: *Deleted

## 2015-02-16 ENCOUNTER — Telehealth: Payer: Self-pay | Admitting: *Deleted

## 2015-02-16 ENCOUNTER — Other Ambulatory Visit: Payer: Self-pay | Admitting: *Deleted

## 2015-02-16 MED ORDER — PROCHLORPERAZINE 25 MG RE SUPP: 25.0000 mg | Freq: Two times a day (BID) | RECTAL | Status: DC | PRN

## 2015-02-16 MED ORDER — UNABLE TO FIND: 10.0000 mL | Freq: Four times a day (QID) | Status: DC

## 2015-02-16 NOTE — Telephone Encounter (Signed)
Script for hydrocodone/apap elixer left at front for patient p/u. Compazine suppositories called in to [facility]. Niece notified.

## 2015-02-16 NOTE — Telephone Encounter (Addendum)
Called  And spoke with patient's niece, patient has gone to [facility] to see MD , about her neck surgery, asked if she got the Dukes mouth wash for her sore throat?"Yes she did, but it made it worse stated niece", patient has a CT chest 02/17/15, and follow up with Dr. Alen Blew 02/19/15, please let patient know I called and she can call back if she needs anything else. 1:13 PM

## 2015-02-16 NOTE — Telephone Encounter (Signed)
Niece lisa calling to say patient is finished with radiation and has difficulty swallowing. Received IVF's Saturday in [place] [facility] For dehydration. Per dr Alen Blew, shw ill continue to get better, now that radiation is completed. Niece requests liquid antiemetic and analgesic. Per kristin curcio NP, called in compazine suppositories and will have printed hard copy of liquid hydrocodone for patient p/u tomorrow while they are here for CT scan. Patient will try to drink contrast prior to scan.

## 2015-02-16 NOTE — Telephone Encounter (Signed)
error 

## 2015-02-17 ENCOUNTER — Encounter (HOSPITAL_COMMUNITY): Payer: Self-pay

## 2015-02-17 ENCOUNTER — Ambulatory Visit (HOSPITAL_COMMUNITY)
Admission: RE | Admit: 2015-02-17 | Discharge: 2015-02-17 | Disposition: A | Discharge status: Home / Self Care | Payer: Medicare Other | Source: Ambulatory Visit | Attending: Oncology | Admitting: Oncology

## 2015-02-17 DIAGNOSIS — C221 Intrahepatic bile duct carcinoma: Secondary | ICD-10-CM

## 2015-02-17 DIAGNOSIS — C7951 Secondary malignant neoplasm of bone: Secondary | ICD-10-CM | POA: Diagnosis not present

## 2015-02-17 DIAGNOSIS — J029 Acute pharyngitis, unspecified: Secondary | ICD-10-CM | POA: Diagnosis not present

## 2015-02-17 MED ORDER — IOHEXOL 300 MG/ML  SOLN
100.0000 mL | Freq: Once | INTRAMUSCULAR | Status: AC | PRN
  Administered 2015-02-17: 100 mL via INTRAVENOUS

## 2015-02-18 NOTE — Progress Notes (Signed)
  Radiation Oncology         [phone] ________________________________  Name: Lindsay Aguirre MRN: [id number]  Date: 01/29/2015  DOB: 1951/02/22  Simulation Verification Note   NARRATIVE: The patient was brought to the treatment unit and placed in the planned treatment position. The clinical setup was verified. Then port films were obtained and uploaded to the radiation oncology medical record software.  The treatment beams were carefully compared against the planned radiation fields. The position, location, and shape of the radiation fields was reviewed. The targeted volume of tissue appears to be appropriately covered by the radiation beams. Based on my personal review, I approved the simulation verification. The patient's treatment will proceed as planned.  ________________________________   Jodelle Gross, MD, PhD

## 2015-02-18 NOTE — Progress Notes (Signed)
  Radiation Oncology         [phone] ________________________________  Name: Lindsay Aguirre MRN: [id number]  Date: 02/11/2015  DOB: Jun 05, 1950  End of Treatment Note  Diagnosis:   Metastatic cholangiocarcinoma     Indication for treatment::  palliative       Radiation treatment dates:   01/29/2015 through 02/11/2015  Site/dose:   The patient was treated To the C5-T10 vertebral bodies to a dose of 30 gray in 10 fractions using a 2 field technique.  Narrative: The patient tolerated radiation treatment relatively well.     Plan: The patient has completed radiation treatment. The patient will return to radiation oncology clinic for routine followup in one month. I advised the patient to call or return sooner if they have any questions or concerns related to their recovery or treatment. ________________________________  Jodelle Gross, M.D., Ph.D.

## 2015-02-19 ENCOUNTER — Encounter (HOSPITAL_COMMUNITY): Payer: Self-pay | Admitting: *Deleted

## 2015-02-19 ENCOUNTER — Emergency Department (HOSPITAL_COMMUNITY): Payer: Medicare Other

## 2015-02-19 ENCOUNTER — Telehealth: Payer: Self-pay | Admitting: *Deleted

## 2015-02-19 ENCOUNTER — Inpatient Hospital Stay (HOSPITAL_COMMUNITY)
Admission: EM | Admit: 2015-02-19 | Discharge: 2015-02-21 | DRG: 543 | Disposition: A | Discharge status: Home / Self Care | Payer: Medicare Other | Attending: Internal Medicine | Admitting: Internal Medicine

## 2015-02-19 ENCOUNTER — Ambulatory Visit: Payer: Medicare Other | Admitting: Oncology

## 2015-02-19 DIAGNOSIS — Z823 Family history of stroke: Secondary | ICD-10-CM

## 2015-02-19 DIAGNOSIS — R4702 Dysphasia: Secondary | ICD-10-CM | POA: Diagnosis present

## 2015-02-19 DIAGNOSIS — E860 Dehydration: Secondary | ICD-10-CM | POA: Diagnosis present

## 2015-02-19 DIAGNOSIS — R1310 Dysphagia, unspecified: Secondary | ICD-10-CM | POA: Diagnosis present

## 2015-02-19 DIAGNOSIS — E785 Hyperlipidemia, unspecified: Secondary | ICD-10-CM | POA: Diagnosis present

## 2015-02-19 DIAGNOSIS — Z888 Allergy status to other drugs, medicaments and biological substances status: Secondary | ICD-10-CM | POA: Diagnosis not present

## 2015-02-19 DIAGNOSIS — Z79899 Other long term (current) drug therapy: Secondary | ICD-10-CM | POA: Diagnosis not present

## 2015-02-19 DIAGNOSIS — J029 Acute pharyngitis, unspecified: Secondary | ICD-10-CM | POA: Diagnosis present

## 2015-02-19 DIAGNOSIS — I10 Essential (primary) hypertension: Secondary | ICD-10-CM | POA: Diagnosis present

## 2015-02-19 DIAGNOSIS — C221 Intrahepatic bile duct carcinoma: Secondary | ICD-10-CM | POA: Diagnosis present

## 2015-02-19 DIAGNOSIS — M542 Cervicalgia: Secondary | ICD-10-CM | POA: Diagnosis present

## 2015-02-19 DIAGNOSIS — E876 Hypokalemia: Secondary | ICD-10-CM | POA: Diagnosis present

## 2015-02-19 DIAGNOSIS — D630 Anemia in neoplastic disease: Secondary | ICD-10-CM | POA: Diagnosis present

## 2015-02-19 DIAGNOSIS — M1990 Unspecified osteoarthritis, unspecified site: Secondary | ICD-10-CM | POA: Diagnosis present

## 2015-02-19 DIAGNOSIS — E039 Hypothyroidism, unspecified: Secondary | ICD-10-CM | POA: Diagnosis present

## 2015-02-19 DIAGNOSIS — Z79891 Long term (current) use of opiate analgesic: Secondary | ICD-10-CM | POA: Diagnosis not present

## 2015-02-19 DIAGNOSIS — C7951 Secondary malignant neoplasm of bone: Secondary | ICD-10-CM | POA: Diagnosis not present

## 2015-02-19 DIAGNOSIS — R627 Adult failure to thrive: Secondary | ICD-10-CM | POA: Diagnosis not present

## 2015-02-19 DIAGNOSIS — Z833 Family history of diabetes mellitus: Secondary | ICD-10-CM

## 2015-02-19 DIAGNOSIS — C787 Secondary malignant neoplasm of liver and intrahepatic bile duct: Secondary | ICD-10-CM | POA: Diagnosis not present

## 2015-02-19 DIAGNOSIS — M109 Gout, unspecified: Secondary | ICD-10-CM | POA: Diagnosis present

## 2015-02-19 DIAGNOSIS — Z8249 Family history of ischemic heart disease and other diseases of the circulatory system: Secondary | ICD-10-CM

## 2015-02-19 DIAGNOSIS — C801 Malignant (primary) neoplasm, unspecified: Secondary | ICD-10-CM | POA: Diagnosis not present

## 2015-02-19 DIAGNOSIS — C220 Liver cell carcinoma: Secondary | ICD-10-CM

## 2015-02-19 HISTORY — DX: Diverticulosis of large intestine without perforation or abscess without bleeding: K57.30

## 2015-02-19 LAB — COMPREHENSIVE METABOLIC PANEL
ALK PHOS: 96 U/L (ref 38–126)
ALT: 22 U/L (ref 14–54)
AST: 44 U/L — AB (ref 15–41)
Albumin: 3.2 g/dL — ABNORMAL LOW (ref 3.5–5.0)
Anion gap: 8 (ref 5–15)
BUN: 15 mg/dL (ref 6–20)
CALCIUM: 9.5 mg/dL (ref 8.9–10.3)
CHLORIDE: 105 mmol/L (ref 101–111)
CO2: 26 mmol/L (ref 22–32)
CREATININE: 0.73 mg/dL (ref 0.44–1.00)
GFR calc Af Amer: >60 mL/min (ref >60)
GFR calc non Af Amer: >60 mL/min (ref >60)
GLUCOSE: 96 mg/dL (ref 65–99)
Potassium: 2.9 mmol/L — ABNORMAL LOW (ref 3.5–5.1)
SODIUM: 139 mmol/L (ref 135–145)
Total Bilirubin: 1 mg/dL (ref 0.3–1.2)
Total Protein: 7.1 g/dL (ref 6.5–8.1)

## 2015-02-19 LAB — PROTIME-INR
INR: 1.32 (ref 0.00–1.49)
Prothrombin Time: 16.5 seconds — ABNORMAL HIGH (ref 11.6–15.2)

## 2015-02-19 LAB — CBC WITH DIFFERENTIAL/PLATELET
BASOS ABS: 0 10*3/uL (ref 0.0–0.1)
Basophils Relative: 0 %
EOS ABS: 0.2 10*3/uL (ref 0.0–0.7)
EOS PCT: 2 %
HCT: 35.4 % — ABNORMAL LOW (ref 36.0–46.0)
HEMOGLOBIN: 11.6 g/dL — AB (ref 12.0–15.0)
LYMPHS ABS: 0.7 10*3/uL (ref 0.7–4.0)
LYMPHS PCT: 6 %
MCH: 31.6 pg (ref 26.0–34.0)
MCHC: 32.8 g/dL (ref 30.0–36.0)
MCV: 96.5 fL (ref 78.0–100.0)
Monocytes Absolute: 1 10*3/uL (ref 0.1–1.0)
Monocytes Relative: 9 %
NEUTROS PCT: 83 %
Neutro Abs: 9.7 10*3/uL — ABNORMAL HIGH (ref 1.7–7.7)
PLATELETS: 223 10*3/uL (ref 150–400)
RBC: 3.67 MIL/uL — AB (ref 3.87–5.11)
RDW: 15.4 % (ref 11.5–15.5)
WBC: 11.7 10*3/uL — AB (ref 4.0–10.5)

## 2015-02-19 MED ORDER — IOHEXOL 300 MG/ML  SOLN
75.0000 mL | Freq: Once | INTRAMUSCULAR | Status: AC | PRN
  Administered 2015-02-19: 75 mL via INTRAVENOUS

## 2015-02-19 MED ORDER — DEXAMETHASONE SODIUM PHOSPHATE 4 MG/ML IJ SOLN
8.0000 mg | Freq: Once | INTRAMUSCULAR | Status: AC
  Administered 2015-02-19: 8 mg via INTRAVENOUS
  Filled 2015-02-19: qty 2

## 2015-02-19 MED ORDER — ALUM & MAG HYDROXIDE-SIMETH 200-200-20 MG/5ML PO SUSP: 30.0000 mL | Freq: Four times a day (QID) | ORAL | Status: DC | PRN

## 2015-02-19 MED ORDER — MORPHINE SULFATE (PF) 4 MG/ML IV SOLN
4.0000 mg | INTRAVENOUS | Status: DC | PRN
Start: 2015-02-19 — End: 2015-02-21

## 2015-02-19 MED ORDER — POTASSIUM CHLORIDE 10 MEQ/100ML IV SOLN
10.0000 meq | INTRAVENOUS | Status: AC
  Administered 2015-02-19 (×5): 10 meq via INTRAVENOUS
  Filled 2015-02-19 (×5): qty 100

## 2015-02-19 MED ORDER — ACETAMINOPHEN 160 MG/5ML PO SOLN
650.0000 mg | ORAL | Status: DC | PRN
  Administered 2015-02-20 (×2): 650 mg via ORAL
  Filled 2015-02-19 (×2): qty 20.3

## 2015-02-19 MED ORDER — MORPHINE SULFATE (CONCENTRATE) 10 MG/0.5ML PO SOLN: 5.0000 mg | ORAL | Status: DC | PRN

## 2015-02-19 MED ORDER — KCL IN DEXTROSE-NACL 40-5-0.9 MEQ/L-%-% IV SOLN
INTRAVENOUS | Status: DC
  Administered 2015-02-20 (×3): via INTRAVENOUS
  Filled 2015-02-19 (×5): qty 1000

## 2015-02-19 MED ORDER — FIRST-DUKES MOUTHWASH MT SUSP: 5.0000 mL | Freq: Four times a day (QID) | OROMUCOSAL | Status: DC

## 2015-02-19 MED ORDER — ENOXAPARIN SODIUM 40 MG/0.4ML ~~LOC~~ SOLN
40.0000 mg | Freq: Every day | SUBCUTANEOUS | Status: DC
  Administered 2015-02-20: 40 mg via SUBCUTANEOUS
  Filled 2015-02-19 (×2): qty 0.4

## 2015-02-19 MED ORDER — LEVOTHYROXINE SODIUM 100 MCG IV SOLR
44.0000 ug | Freq: Every day | INTRAVENOUS | Status: DC
  Administered 2015-02-20: 44 ug via INTRAVENOUS
  Filled 2015-02-19 (×2): qty 5

## 2015-02-19 MED ORDER — ALBUTEROL SULFATE HFA 108 (90 BASE) MCG/ACT IN AERS: 2.0000 | INHALATION_SPRAY | Freq: Four times a day (QID) | RESPIRATORY_TRACT | Status: DC | PRN

## 2015-02-19 MED ORDER — ONDANSETRON HCL 4 MG PO TABS: 4.0000 mg | ORAL_TABLET | Freq: Four times a day (QID) | ORAL | Status: DC | PRN

## 2015-02-19 MED ORDER — ONDANSETRON HCL 4 MG/2ML IJ SOLN: 4.0000 mg | Freq: Four times a day (QID) | INTRAMUSCULAR | Status: DC | PRN

## 2015-02-19 MED ORDER — ALBUTEROL SULFATE (2.5 MG/3ML) 0.083% IN NEBU: 2.5000 mg | INHALATION_SOLUTION | Freq: Four times a day (QID) | RESPIRATORY_TRACT | Status: DC | PRN

## 2015-02-19 MED ORDER — SODIUM CHLORIDE 0.9 % IV BOLUS (SEPSIS)
1000.0000 mL | Freq: Once | INTRAVENOUS | Status: AC
  Administered 2015-02-19: 1000 mL via INTRAVENOUS

## 2015-02-19 MED ORDER — MAGIC MOUTHWASH
5.0000 mL | Freq: Four times a day (QID) | ORAL | Status: DC
  Administered 2015-02-20 – 2015-02-21 (×5): 5 mL via ORAL
  Filled 2015-02-19 (×9): qty 5

## 2015-02-19 MED ORDER — DEXAMETHASONE SODIUM PHOSPHATE 4 MG/ML IJ SOLN
4.0000 mg | Freq: Four times a day (QID) | INTRAMUSCULAR | Status: DC
  Administered 2015-02-20 – 2015-02-21 (×6): 4 mg via INTRAVENOUS
  Filled 2015-02-19 (×6): qty 1

## 2015-02-19 NOTE — ED Notes (Signed)
EMS called to home.  Found patient with compalints of throat pain after patient has completed her last  Treatment of chemo 10 days ago.  Patient is alert and oriented x4.  Currently she patient denies pain  After she has received 100 mcg of fentanyl by EMS

## 2015-02-19 NOTE — ED Notes (Signed)
Pt states her throat is very sore and hurts

## 2015-02-19 NOTE — ED Notes (Signed)
Pt requested for blood draws to be taken from port, RN informed

## 2015-02-19 NOTE — H&P (Signed)
History and Physical:    Lindsay Aguirre   CLE:[id number] DOB: February 18, 1951 DOA: 02/19/2015  Referring MD/provider: Orpah Greek, MD PCP: Chevis Pretty, FNP  Oncologist: Dr. Zola Button  Chief Complaint: Inability to swallow, sore throat  History of Present Illness:   Lindsay Aguirre is an 64 y.o. 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 Lisbeth Renshaw) to cervical/thoracic spine area (C5-T10), s/p systemic chemotherapy (gemcitabine last given 8//16) under the care of Dr. Alen Blew, who presents with a 9 day history of worsening throat pain progressing to the inability to swallow even liquids.  When she tries to swallow, she has pain and regurgitates the liquids.  [facility] magic mouthwash eases the pain slightly.  She also noticed a recurrence of hoarseness.  The pain in her throat is rated 10/10, sharp in quality, and worse with swallowing.    ROS:   Review of Systems  Constitutional: Positive for weight loss and malaise/fatigue. Negative for fever and chills.  HENT: Positive for ear pain and sore throat. Negative for congestion, ear discharge and nosebleeds.        Right ear popping/pain.  Eyes: Negative.   Respiratory: Positive for cough and sputum production. Negative for hemoptysis, shortness of breath, wheezing and stridor.   Cardiovascular: Negative for chest pain, palpitations and leg swelling.  Gastrointestinal: Positive for nausea, vomiting and constipation. Negative for heartburn, abdominal pain, diarrhea, blood in stool and melena.  Genitourinary: Negative.   Musculoskeletal: Negative for myalgias, joint pain, falls and neck pain.  Skin: Negative.   Neurological: Positive for weakness. Negative for dizziness and headaches.       Hoarseness  Endo/Heme/Allergies: Negative for environmental allergies and polydipsia. Does not bruise/bleed easily.    Psychiatric/Behavioral: Negative for depression. The patient has insomnia. The patient is not nervous/anxious.      Past Medical History:   Past Medical History  Diagnosis Date  . PONV (postoperative nausea and vomiting)   . Hypertension   . Hypothyroidism   . Arthritis   . Gout   . Neuromuscular disorder ([place])   . Hyperlipidemia   . Family history of anesthesia complication     '" MY OLDEST SON Griffin ME "  . Cholangiocarcinoma ([place]) 08/05/2013  . Diverticulosis of large intestine without hemorrhage 03/05/2014    Past Surgical History:   Past Surgical History  Procedure Laterality Date  . Cervical fusion  '99 & '03  . Rotator cuff surgery  '08    right  . Cataract extraction w/phaco  12/13/2010    Procedure: CATARACT EXTRACTION PHACO AND INTRAOCULAR LENS PLACEMENT (IOC);  Surgeon: Tonny Branch;  Location: [facility];  Service: Ophthalmology;  Laterality: Right;  CDE: 8.94  . Eye surgery    . Back surgery      2009  . Back surgery  2011    lumbar surgery  . Tubal ligation  1979  . Rotator cuff repair Left 04/09/2013    DR Marlou Sa  . Shoulder arthroscopy with subacromial decompression and open rotator c Left 04/09/2013    Procedure: SHOULDER ARTHROSCOPY WITH SUBACROMIAL DECOMPRESSION AND MINI OPEN ROTATOR CUFF REPAIR, POSSIBLE BICEP TENDONODESIS,  ;  Surgeon: Meredith Pel, MD;  Location: [facility];  Service: Orthopedics;  Laterality: Left;    Social History:   Social History   Social History  . Marital Status: Divorced    Spouse Name: N/A  . Number of  Children: 3  . Years of Education: 12   Occupational History  . Retired from [employer] work.    Social History Main Topics  . Smoking status: Never Smoker   . Smokeless tobacco: Never Used  . Alcohol Use: No  . Drug Use: No  . Sexual Activity: Not on file   Other Topics Concern  . Not on file   Social History Narrative   Divorced.  Lives at own home, oldest son lives with her.  Ambulates without  assistance at baseline.  Has a walker but doesn't typically need it.    Family history:   Family History  Problem Relation Age of Onset  . Anesthesia problems Neg Hx   . Hypotension Neg Hx   . Malignant hyperthermia Neg Hx   . Pseudochol deficiency Neg Hx   . Diabetes Sister   . Stroke Mother   . Heart disease Father   . Stroke Father   . Diabetes Brother   . Diabetes Sister   . Diabetes Sister   . Diabetes Sister     Allergies   Livalo; Rosuvastatin calcium; Simvastatin; and Statins  Current Medications:   Prior to Admission medications   Medication Sig Start Date End Date Taking? Authorizing Provider  acetaminophen (TYLENOL) 500 MG tablet Take 1,000 mg by mouth daily as needed for mild pain.   Yes Historical Provider, MD  allopurinol (ZYLOPRIM) 300 MG tablet TAKE 1 TABLET (300 MG TOTAL) BY MOUTH DAILY. 12/16/14  Yes [name] Hassell Done, FNP  bifidobacterium infantis (ALIGN) capsule Take 1 capsule by mouth daily.   Yes Historical Provider, MD  Diphenhyd-Hydrocort-Nystatin (FIRST-DUKES MOUTHWASH) SUSP Use as directed 5 mLs in the mouth or throat 4 (four) times daily. With meals and at bedtime   Yes Historical Provider, MD  emollient (BIAFINE) cream Apply 1 application topically daily as needed (redness).    Yes Historical Provider, MD  HYDROcodone-acetaminophen (NORCO/VICODIN) 5-325 MG tablet Take 1 tablet by mouth every 6 (six) hours as needed for moderate pain. Patient taking differently: Take 1 tablet by mouth daily as needed for moderate pain.  02/05/15  Yes Wyatt Portela, MD  lidocaine-prilocaine (EMLA) cream Apply 1 application topically as needed. Apply cream, on days of treatment, to [facility] site 1-2 hours prior to treatment. 08/27/13  Yes Wyatt Portela, MD  ondansetron (ZOFRAN) 8 MG tablet Take 1 tablet (8 mg total) by mouth every 8 (eight) hours as needed for nausea or vomiting. 02/05/15  Yes Wyatt Portela, MD  polyethylene glycol (MIRALAX / GLYCOLAX) packet Take 17 g by  mouth daily as needed for mild constipation.   Yes Historical Provider, MD  prochlorperazine (COMPAZINE) 10 MG tablet Take 1 tablet (10 mg total) by mouth every 6 (six) hours as needed for nausea or vomiting. 07/03/14  Yes Adrena E Johnson, PA-C  SYNTHROID 88 MCG tablet TAKE 1 TABLET (88 MCG TOTAL) BY MOUTH DAILY BEFORE BREAKFAST. 01/21/15  Yes [name] Hassell Done, FNP  UNABLE TO FIND Take 10 mLs by mouth every 6 (six) hours. Hydrocodone-apap elixer 5mg s/10 mls every 6 hours prn pain.  240 mls   Patient having difficulty swallowing 02/16/15  Yes Maryanna Shape, NP  albuterol (PROVENTIL HFA;VENTOLIN HFA) 108 (90 BASE) MCG/ACT inhaler Inhale 2 puffs into the lungs every 6 (six) hours as needed for wheezing or shortness of breath. Patient not taking: Reported on 02/19/2015 01/14/15   Fransisca Kaufmann Dettinger, MD  benzonatate (TESSALON) 200 MG capsule Take 1 capsule (200 mg total) by mouth 2 (  two) times daily as needed for cough. Patient not taking: Reported on 02/06/2015 01/14/15   Fransisca Kaufmann Dettinger, MD  levofloxacin (LEVAQUIN) 750 MG tablet Take 1 tablet (750 mg total) by mouth daily. Patient not taking: Reported on 02/19/2015 01/14/15   Fransisca Kaufmann Dettinger, MD  lisinopril-hydrochlorothiazide (PRINZIDE,ZESTORETIC) 20-12.5 MG per tablet Take 1 tablet by mouth every evening. Patient not taking: Reported on 02/19/2015 07/10/14   [name] Hassell Done, FNP  prochlorperazine (COMPAZINE) 25 MG suppository Place 1 suppository (25 mg total) rectally every 12 (twelve) hours as needed for nausea or vomiting. Patient not taking: Reported on 02/19/2015 02/16/15   Maryanna Shape, NP  senna (SENOKOT) 8.6 MG TABS tablet Take 2 tablets (17.2 mg total) by mouth daily. Patient not taking: Reported on 02/19/2015 09/20/13   Wyatt Portela, MD    Physical Exam:   Filed Vitals:   02/19/15 1127 02/19/15 1317 02/19/15 1430 02/19/15 1537  BP: 132/63 130/66 133/54 114/58  Pulse: 87 93 95 90  Temp: 97.6 F (36.4 C)     TempSrc:  Oral     Resp: 16 18 16 18   SpO2: 95% 99%  97%     Physical Exam: Blood pressure 114/58, pulse 90, temperature 97.6 F (36.4 C), temperature source Oral, resp. rate 18, SpO2 97 %. Gen: No acute distress. Head: Normocephalic, atraumatic. Eyes: PERRL, EOMI, sclerae nonicteric. Mouth: Oropharynx is clear. No evidence of thrush. Dry mucous membranes. Neck: Supple, no thyromegaly, no lymphadenopathy, no jugular venous distention. Chest: Lungs clear to auscultation bilaterally. CV: Heart sounds are regular. No murmurs, rubs, or gallops. Abdomen: Soft, nontender, nondistended with normal active bowel sounds. Extremities: Extremities are without clubbing, edema, or cyanosis. Skin: Warm and dry. No rashes. Neuro: Alert and oriented times 3; grossly nonfocal. Psych: Mood and affect normal.   Data Review:    Labs: Basic Metabolic Panel:  Recent Labs Lab 02/14/15 2000 02/19/15 1221  NA 136 139  K 3.5 2.9*  CL 102 105  CO2 25 26  GLUCOSE 105* 96  BUN 13 15  CREATININE 0.71 0.73  CALCIUM 10.0 9.5   Liver Function Tests:  Recent Labs Lab 02/19/15 1221  AST 44*  ALT 22  ALKPHOS 96  BILITOT 1.0  PROT 7.1  ALBUMIN 3.2*   CBC:  Recent Labs Lab 02/14/15 2000 02/19/15 1221  WBC 11.5* 11.7*  NEUTROABS 9.0* 9.7*  HGB 11.8* 11.6*  HCT 36.4 35.4*  MCV 97.6 96.5  PLT 232 223   Radiographic Studies: Ct Soft Tissue Neck W Contrast  02/19/2015  CLINICAL DATA:  Neck pain since spine surgery in March. Cholangiocarcinoma. EXAM: CT NECK WITH CONTRAST TECHNIQUE: Multidetector CT imaging of the neck was performed using the standard protocol following the bolus administration of intravenous contrast. CONTRAST:  56mL OMNIPAQUE IOHEXOL 300 MG/ML  SOLN COMPARISON:  Cervical spine MRI 12/05/2014 FINDINGS: Pharynx and larynx: No asymmetric mass or inflammatory enhancement. Mild lower pharyngeal and laryngeal edema suspected from radiation. Salivary glands: Mild edema within the  submandibular glands, likely radiation related. Thyroid: Negative Lymph nodes: No enlarged or necrotic appearing lymph nodes. Vascular: Major vessels are patent Limited intracranial: Minimal imaging is negative for metastatic disease. Visualized orbits: Negative Mastoids and visualized paranasal sinuses: Negative Skeleton: New metastasis within the posterior aspect of the T3 vertebral body. The lesion erodes the dorsal cortex without gross extra-spinal spread. Extensive posterior element metastatic disease involving the fused C6-7 levels has been treated with decompressive laminectomy from C4-T1 and presumed facetectomy. There is pre-existing C5-6 and  C6-7 discectomies with complete bony fusion. Despite the surgery, there is progressive destruction of the bilateral C7 articular process with larger bilateral vertebral body erosion. The C6-7 foramina are compromised by tumor; assessment of the canal is not possible due to extensive streak artifact from hardware which now extends posteriorly from C3-T2. No evidence of hardware fracture or displacement. Upper chest: Thickened appearance of the upper esophagus with prominent mucosal enhancement, likely radiation related if performed. No apical pulmonary nodules. IMPRESSION: 1. Progressive C6 and C7 metastasis with bilateral C6-7 foramina involvement. Cervical canal cannot be evaluated due to CT limitations and hardware artifact. 2. New T3 vertebral body metastasis. 3. Mild inflammation in the visceral neck correlating with radiation therapy. Electronically Signed   By: Monte Fantasia M.D.   On: 02/19/2015 15:07   *I have personally reviewed the images above*  EKG: None performed.   Assessment/Plan:   Principal Problem:   Dehydration secondary to dysphagia in the setting of cervical spine metastasis - Admit and start IV fluids with dextrose. Will likely need a PEG for nutrition. - Start Decadron to decrease inflammation in the area of her cervical spine  metastasis.  Active Problems:   Hypertension - Hold antihypertensives for now.    Hypothyroidism - Give Synthroid at half her normal dose through the IV route.    Gout - Hold allopurinol for now.    Cholangiocarcinoma [facility]) - Oncologist is aware of admission.    Hypokalemia - We'll give potassium runs 5 and add potassium to IV fluids. Recheck electrolytes in the morning.    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 No current indication for transfusion.    DVT prophylaxis - Lovenox ordered.   Attestation regarding necessity of inpatient status:   The appropriate admission status for this patient is INPATIENT. Inpatient status is judged to be reasonable and necessary in order to provide the required intensity of service to ensure the patient's safety. The patient's presenting symptoms, physical exam findings, and initial radiographic and laboratory data in the context of their chronic comorbidities is felt to place them at high risk for further clinical deterioration. Furthermore, it is not anticipated that the patient will be medically stable for discharge from the hospital within 2 midnights of admission. The following factors support the admission status of inpatient.   -The patient's presenting symptoms include dysphasia/inability to swallow liquids. - The worrisome physical exam findings include dry mucous membranes. - The initial radiographic and laboratory data are worrisome because of hypokalemia. - The chronic co-morbidities include cholangiocarcinoma with evidence of progression of disease in cervical spine metastasis which is likely the reason for her progressive dysphasia. - Patient requires inpatient status due to high intensity of service, high risk for further deterioration and high frequency of surveillance required. - I certify that at the point of admission it is my clinical judgment that the patient will require inpatient hospital care spanning beyond 2 midnights from  the point of admission.  Code Status: Full.  Does not have an advanced directive, living will or POA. Family Communication: Shavanna Furnari (niece) & Carol Ada (daugther), are emergency contact.  Sister, Parke Simmers updated at the bedside. Disposition Plan: Home when stable.  Time spent: 70 minutes.  RAMA,CHRISTINA [facility] Pager [phone] Cell: [phone]   If 7PM-7AM, please contact night-coverage [email or web address] Password [email or web address] 02/19/2015, 4:20 PM

## 2015-02-19 NOTE — Telephone Encounter (Signed)
Voicemail received from friend of patient stating that she is going to the ED due to throat soreness and wanted MD to be aware. Message sent to MD [facility].

## 2015-02-19 NOTE — ED Notes (Signed)
Pt ambulated to RR unassisted 

## 2015-02-19 NOTE — ED Notes (Signed)
Bed: WA15 Expected date:  Expected time:  Means of arrival:  Comments: EMS  

## 2015-02-19 NOTE — ED Provider Notes (Signed)
CSN: [id number]     Arrival date & time 02/19/15  1125 History   First MD Initiated Contact with Patient 02/19/15 1137     Chief Complaint  Patient presents with  . Ca pt, Sore Throat      (Consider location/radiation/quality/duration/timing/severity/associated sxs/prior Treatment) HPI Comments: Patient presents to the ER for evaluation of respiratory worsening sore throat and trouble swallowing. 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 She has had radiation therapy since. Patient has noticed progressively worsening throat pain, pain with swallowing, trouble swallowing. Patient reports she is now having difficulty even swallowing water. Pain radiates up both ureters from the throat and anterior neck region.   Past Medical History  Diagnosis Date  . PONV (postoperative nausea and vomiting)   . Hypertension   . Hypothyroidism   . Arthritis   . Gout   . Neuromuscular disorder ([place])   . Hyperlipidemia   . Family history of anesthesia complication     '" MY OLDEST SON [place] "  . 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 Cervical fusion  '99 & '03  . Rotator cuff surgery  '08    right  . Cataract extraction w/phaco  12/13/2010    Procedure: CATARACT EXTRACTION PHACO AND INTRAOCULAR LENS PLACEMENT (IOC);  Surgeon: Tonny Branch;  Location: [facility];  Service: Ophthalmology;  Laterality: Right;  CDE: 8.94  . Eye surgery    . Back surgery      2009  . Back surgery  2011    lumbar surgery  . Tubal ligation  1979  . Rotator cuff repair Left 04/09/2013    DR Marlou Sa  . Shoulder arthroscopy with subacromial decompression and open rotator c Left 04/09/2013    Procedure: SHOULDER ARTHROSCOPY WITH SUBACROMIAL DECOMPRESSION AND MINI OPEN ROTATOR CUFF REPAIR, POSSIBLE BICEP TENDONODESIS,  ;  Surgeon: Meredith Pel, MD;  Location: [facility];  Service: Orthopedics;  Laterality: Left;    Family History  Problem Relation Age of Onset  . Anesthesia problems Neg Hx   . Hypotension Neg Hx   . Malignant hyperthermia Neg Hx   . Pseudochol deficiency Neg Hx   . Diabetes Sister   . Stroke Mother   . Heart disease Father   . Stroke Father   . Diabetes Brother   . Diabetes Sister   . Diabetes Sister   . Diabetes Sister    Social History  Substance Use Topics  . Smoking status: Never Smoker   . Smokeless tobacco: Never Used  . Alcohol Use: No   OB History    No data available     Review of Systems  HENT: Positive for sore throat and voice change.   All other systems reviewed and are negative.     Allergies  Livalo; Simvastatin; and Statins  Home Medications   Prior to Admission medications   Medication Sig Start Date End Date Taking? Authorizing Provider  acetaminophen (TYLENOL) 500 MG tablet Take 1,000 mg by mouth daily as needed for mild pain.    Historical Provider, MD  albuterol (PROVENTIL HFA;VENTOLIN HFA) 108 (90 BASE) MCG/ACT inhaler Inhale 2 puffs into the lungs every 6 (six) hours as needed for wheezing or shortness of breath. 01/14/15   Fransisca Kaufmann Dettinger, MD  allopurinol (ZYLOPRIM) 300 MG tablet TAKE 1 TABLET (300 MG TOTAL) BY MOUTH DAILY. 12/16/14   [name] Hassell Done, FNP  benzonatate (TESSALON) 200  MG capsule Take 1 capsule (200 mg total) by mouth 2 (two) times daily as needed for cough. Patient not taking: Reported on 02/06/2015 01/14/15   Fransisca Kaufmann Dettinger, MD  bifidobacterium infantis (ALIGN) capsule Take 1 capsule by mouth daily.    Historical Provider, MD  Cholecalciferol (VITAMIN D) 2000 UNITS tablet Take 2,000 Units by mouth daily.    Historical Provider, MD  Diphenhyd-Hydrocort-Nystatin (FIRST-DUKES MOUTHWASH) SUSP Use as directed 5 mLs in the mouth or throat 4 (four) times daily. With meals and at bedtime    Historical Provider, MD  emollient (BIAFINE) cream Apply 1 application topically daily as needed (redness).     Historical Provider,  MD  HYDROcodone-acetaminophen (NORCO/VICODIN) 5-325 MG tablet Take 1 tablet by mouth every 6 (six) hours as needed for moderate pain. Patient taking differently: Take 1 tablet by mouth daily as needed for moderate pain.  02/05/15   Wyatt Portela, MD  levofloxacin (LEVAQUIN) 750 MG tablet Take 1 tablet (750 mg total) by mouth daily. Patient not taking: Reported on 02/06/2015 01/14/15   Fransisca Kaufmann Dettinger, MD  lidocaine-prilocaine (EMLA) cream Apply 1 application topically as needed. Apply cream, on days of treatment, to [facility] site 1-2 hours prior to treatment. 08/27/13   Wyatt Portela, MD  lisinopril-hydrochlorothiazide (PRINZIDE,ZESTORETIC) 20-12.5 MG per tablet Take 1 tablet by mouth every evening. 07/10/14   [name] Hassell Done, FNP  ondansetron (ZOFRAN) 8 MG tablet Take 1 tablet (8 mg total) by mouth every 8 (eight) hours as needed for nausea or vomiting. 02/05/15   Wyatt Portela, MD  polyethylene glycol (MIRALAX / GLYCOLAX) packet Take 17 g by mouth daily as needed for mild constipation.    Historical Provider, MD  prochlorperazine (COMPAZINE) 10 MG tablet Take 1 tablet (10 mg total) by mouth every 6 (six) hours as needed for nausea or vomiting. 07/03/14   Carlton Adam, PA-C  prochlorperazine (COMPAZINE) 25 MG suppository Place 1 suppository (25 mg total) rectally every 12 (twelve) hours as needed for nausea or vomiting. 02/16/15   Maryanna Shape, NP  senna (SENOKOT) 8.6 MG TABS tablet Take 2 tablets (17.2 mg total) by mouth daily. 09/20/13   Wyatt Portela, MD  SYNTHROID 88 MCG tablet TAKE 1 TABLET (88 MCG TOTAL) BY MOUTH DAILY BEFORE BREAKFAST. 01/21/15   [name] Hassell Done, FNP  UNABLE TO FIND Take 10 mLs by mouth every 6 (six) hours. Hydrocodone-apap elixer 5mg s/10 mls every 6 hours prn pain.  240 mls   Patient having difficulty swallowing 02/16/15   Maryanna Shape, NP  vitamin C (ASCORBIC ACID) 500 MG tablet Take 500 mg by mouth daily.    Historical Provider, MD   BP 132/63 mmHg  Pulse  87  Temp(Src) 97.6 F (36.4 C) (Oral)  Resp 16  SpO2 95% Physical Exam  Constitutional: She is oriented to person, place, and time. She appears well-developed and well-nourished. No distress.  HENT:  Head: Normocephalic and atraumatic.  Right Ear: Hearing normal.  Left Ear: Hearing normal.  Nose: Nose normal.  Mouth/Throat: Oropharynx is clear and moist and mucous membranes are normal.  Eyes: Conjunctivae and EOM are normal. Pupils are equal, round, and reactive to light.  Neck: Normal range of motion. Neck supple.  Cardiovascular: Regular rhythm, S1 normal and S2 normal.  Exam reveals no gallop and no friction rub.   No murmur heard. Pulmonary/Chest: Effort normal and breath sounds normal. No respiratory distress. She exhibits no tenderness.  Abdominal: Soft. Normal appearance and bowel sounds are  normal. There is no hepatosplenomegaly. There is no tenderness. There is no rebound, no guarding, no tenderness at McBurney's point and negative Murphy's sign. No hernia.  Musculoskeletal: Normal range of motion.  Neurological: She is alert and oriented to person, place, and time. She has normal strength. No cranial nerve deficit or sensory deficit. Coordination normal. GCS eye subscore is 4. GCS verbal subscore is 5. GCS motor subscore is 6.  Skin: Skin is warm, dry and intact. No rash noted. No cyanosis.  Psychiatric: She has a normal mood and affect. Her speech is normal and behavior is normal. Thought content normal.  Nursing note and vitals reviewed.   ED Course  Procedures (including critical care time) Labs Review Labs Reviewed  CBC WITH DIFFERENTIAL/PLATELET  COMPREHENSIVE METABOLIC PANEL  PROTIME-INR    Imaging Review Ct Chest W Contrast  02/17/2015  CLINICAL DATA:  64 year old female with history of cholangiocarcinoma diagnosed in April 2015. Most recent chemotherapy completed in September 2016. Followup study. EXAM: CT CHEST, ABDOMEN, AND PELVIS WITH CONTRAST TECHNIQUE:  Multidetector CT imaging of the chest, abdomen and pelvis was performed following the standard protocol during bolus administration of intravenous contrast. CONTRAST:  127mL OMNIPAQUE IOHEXOL 300 MG/ML  SOLN COMPARISON:  CT the abdomen and pelvis 10/07/2014. Chest CT 10/29/2013. FINDINGS: CT CHEST FINDINGS Mediastinum/Nodes: Heart size is normal. Small amount of new pericardial fluid and/or thickening, unlikely to be of any hemodynamic significance at this time, however, there is a small focus of potential pericardial calcification identified on image 72 of series 4, which could indicate some pericardial metastasis. There is atherosclerosis of the thoracic aorta, the great vessels of the mediastinum and the coronary arteries, including calcified atherosclerotic plaque in the left main and left anterior descending coronary arteries. No pathologically enlarged mediastinal or hilar lymph nodes. Esophagus is unremarkable in appearance. No axillary lymphadenopathy. Right internal jugular single-lumen porta cath with tip terminating at the superior cavoatrial junction. Lungs/Pleura: New 5 mm nodule in the posterior aspect of the left lower lobe (image 36 of series 7). 3 mm nodule in the lateral segment of the right middle lobe (image 30 of series 7) is unchanged. No acute consolidative airspace disease. No pleural effusions. Musculoskeletal: New lytic lesion measuring 11 mm in the posterior aspect of the T3 vertebral body (image 12 of series 7). Orthopedic fixation hardware at T1 and T2. CT ABDOMEN PELVIS FINDINGS Hepatobiliary: Marked progression of disease throughout the liver. Specifically, there is now a very large confluent mass occupying the majority of the left lobe of the liver as well as much of segment 5. Previously described hypervascular lesion at the junction of segments 2 and 4A in the left lobe of the liver is now inseparable from the larger mass. Previously noted index lesion in segment 8 has significantly  increased in size (image 35 of series 2), currently 3.9 x 3.0 cm (previously 2.1 x 1.2 cm). Previously noted lesion in segment 7 of the liver has also significantly increased in size (image 21 of series 2), currently measuring 4.2 x 5.1 cm (previously 3.2 x 2.8 cm). Multiple lung new lesions are noted as well. No intra or extrahepatic biliary ductal dilatation. Gallbladder is unremarkable in appearance. Pancreas: No pancreatic mass. No pancreatic ductal dilatation. No pancreatic or peripancreatic fluid or inflammatory changes. Spleen: Spleen is borderline enlarged measuring 13.0 x 5.7 x 8.9 cm (estimated splenic volume of 330 mL). Adrenals/Urinary Tract: Bilateral kidneys and bilateral adrenal glands are normal in appearance. No hydroureteronephrosis. Urinary bladder is normal  in appearance. Stomach/Bowel: Normal appearance of the stomach. No pathologic dilatation of small bowel or colon. Vascular/Lymphatic: Atherosclerosis throughout the abdominal and pelvic vasculature, without evidence of aneurysm or dissection. Portal vein is patent. Splenic vein is also patent, but there are numerous splenorenal collaterals on the left side. Additionally, there are numerous collaterals between the left renal vein and the left gonadal vein. Numerous prominent upper abdominal lymph nodes measuring up to 1 cm in short axis in the portacaval nodal station. Reproductive: Uterus and right ovary are unremarkable. 3.4 x 3.1 cm well-defined intermediate attenuation lesion in the left adnexal region is similar to the prior study, likely a mildly proteinaceous left ovarian cyst. Other: Haziness of the omentum immediately anterior to the liver, best appreciated on image 86 of series 4, increased compared to the prior examination. Small volume of ascites. No pneumoperitoneum. Musculoskeletal: Status post PLIF from L3-L5 with interbody grafts at L3-L4 and L4-L5. There are no aggressive appearing lytic or blastic lesions noted in the  visualized portions of the skeleton. IMPRESSION: 1. Today's study demonstrates severe progression of disease with marked enlargement of numerous masses and nodules in the liver. In addition, there is a new osseous metastasis in the posterior aspect of the T3 vertebral body. There is also a new left lower lobe pulmonary nodule measuring 5 mm (image 36 of series 7) which is nonspecific but suspicious, and there is a small amount of pericardial fluid and/or thickening with some pericardial enhancement on today's study which suggests potential metastatic disease to the pericardium. There is also slight haziness in the omentum overlying the liver, which could simply represent some edema, however, the possibility of early intraperitoneal spread of disease with omental involvement is not excluded. 2. Small volume of ascites. 3. Borderline splenomegaly. Worsening portosystemic collateralization, as evidenced by enlarging left-sided splenorenal collaterals and collaterals into the left gonadal vein. Portal vein remains patent at this time. 4. Additional incidental findings, as above, similar to the prior study. Electronically Signed   By: Vinnie Langton M.D.   On: 02/17/2015 17:26   Ct Abdomen Pelvis W Contrast  02/17/2015  CLINICAL DATA:  64 year old female with history of cholangiocarcinoma diagnosed in April 2015. Most recent chemotherapy completed in September 2016. Followup study. EXAM: CT CHEST, ABDOMEN, AND PELVIS WITH CONTRAST TECHNIQUE: Multidetector CT imaging of the chest, abdomen and pelvis was performed following the standard protocol during bolus administration of intravenous contrast. CONTRAST:  173mL OMNIPAQUE IOHEXOL 300 MG/ML  SOLN COMPARISON:  CT the abdomen and pelvis 10/07/2014. Chest CT 10/29/2013. FINDINGS: CT CHEST FINDINGS Mediastinum/Nodes: Heart size is normal. Small amount of new pericardial fluid and/or thickening, unlikely to be of any hemodynamic significance at this time, however, there  is a small focus of potential pericardial calcification identified on image 72 of series 4, which could indicate some pericardial metastasis. There is atherosclerosis of the thoracic aorta, the great vessels of the mediastinum and the coronary arteries, including calcified atherosclerotic plaque in the left main and left anterior descending coronary arteries. No pathologically enlarged mediastinal or hilar lymph nodes. Esophagus is unremarkable in appearance. No axillary lymphadenopathy. Right internal jugular single-lumen porta cath with tip terminating at the superior cavoatrial junction. Lungs/Pleura: New 5 mm nodule in the posterior aspect of the left lower lobe (image 36 of series 7). 3 mm nodule in the lateral segment of the right middle lobe (image 30 of series 7) is unchanged. No acute consolidative airspace disease. No pleural effusions. Musculoskeletal: New lytic lesion measuring 11 mm in  the posterior aspect of the T3 vertebral body (image 12 of series 7). Orthopedic fixation hardware at T1 and T2. CT ABDOMEN PELVIS FINDINGS Hepatobiliary: Marked progression of disease throughout the liver. Specifically, there is now a very large confluent mass occupying the majority of the left lobe of the liver as well as much of segment 5. Previously described hypervascular lesion at the junction of segments 2 and 4A in the left lobe of the liver is now inseparable from the larger mass. Previously noted index lesion in segment 8 has significantly increased in size (image 35 of series 2), currently 3.9 x 3.0 cm (previously 2.1 x 1.2 cm). Previously noted lesion in segment 7 of the liver has also significantly increased in size (image 21 of series 2), currently measuring 4.2 x 5.1 cm (previously 3.2 x 2.8 cm). Multiple lung new lesions are noted as well. No intra or extrahepatic biliary ductal dilatation. Gallbladder is unremarkable in appearance. Pancreas: No pancreatic mass. No pancreatic ductal dilatation. No  pancreatic or peripancreatic fluid or inflammatory changes. Spleen: Spleen is borderline enlarged measuring 13.0 x 5.7 x 8.9 cm (estimated splenic volume of 330 mL). Adrenals/Urinary Tract: Bilateral kidneys and bilateral adrenal glands are normal in appearance. No hydroureteronephrosis. Urinary bladder is normal in appearance. Stomach/Bowel: Normal appearance of the stomach. No pathologic dilatation of small bowel or colon. Vascular/Lymphatic: Atherosclerosis throughout the abdominal and pelvic vasculature, without evidence of aneurysm or dissection. Portal vein is patent. Splenic vein is also patent, but there are numerous splenorenal collaterals on the left side. Additionally, there are numerous collaterals between the left renal vein and the left gonadal vein. Numerous prominent upper abdominal lymph nodes measuring up to 1 cm in short axis in the portacaval nodal station. Reproductive: Uterus and right ovary are unremarkable. 3.4 x 3.1 cm well-defined intermediate attenuation lesion in the left adnexal region is similar to the prior study, likely a mildly proteinaceous left ovarian cyst. Other: Haziness of the omentum immediately anterior to the liver, best appreciated on image 86 of series 4, increased compared to the prior examination. Small volume of ascites. No pneumoperitoneum. Musculoskeletal: Status post PLIF from L3-L5 with interbody grafts at L3-L4 and L4-L5. There are no aggressive appearing lytic or blastic lesions noted in the visualized portions of the skeleton. IMPRESSION: 1. Today's study demonstrates severe progression of disease with marked enlargement of numerous masses and nodules in the liver. In addition, there is a new osseous metastasis in the posterior aspect of the T3 vertebral body. There is also a new left lower lobe pulmonary nodule measuring 5 mm (image 36 of series 7) which is nonspecific but suspicious, and there is a small amount of pericardial fluid and/or thickening with some  pericardial enhancement on today's study which suggests potential metastatic disease to the pericardium. There is also slight haziness in the omentum overlying the liver, which could simply represent some edema, however, the possibility of early intraperitoneal spread of disease with omental involvement is not excluded. 2. Small volume of ascites. 3. Borderline splenomegaly. Worsening portosystemic collateralization, as evidenced by enlarging left-sided splenorenal collaterals and collaterals into the left gonadal vein. Portal vein remains patent at this time. 4. Additional incidental findings, as above, similar to the prior study. Electronically Signed   By: Vinnie Langton M.D.   On: 02/17/2015 17:26   I have personally reviewed and evaluated these images and lab results as part of my medical decision-making.   EKG Interpretation None      MDM   Final  diagnoses:  None   metastatic cancer  Patient presents to the ER for evaluation of difficulty with swallowing. She reports that she has had worsening of her symptoms in the last couple of days. She has been unable to even follow a clear liquid diet, stating that the proximal and water is difficult to swallow now. She did not have any obvious mass audible on examination. Airway is patent. CT scan of neck was performed. There is no cancer, hematoma, abnormality in the area of the esophagus or airway, but there is evidence of progression of metastatic disease in the posterior cervical spine. She had a CT scan performed this week as well which shows progressive disease in her abdomen. Case was discussed with Dr. Barbaraann Faster, her oncologist. He asks for the patient to be admitted overnight for IV fluids and he will see her in the hospital to discuss further treatments.    Orpah Greek, MD 02/19/15 1537

## 2015-02-20 DIAGNOSIS — M542 Cervicalgia: Secondary | ICD-10-CM

## 2015-02-20 DIAGNOSIS — C7951 Secondary malignant neoplasm of bone: Principal | ICD-10-CM

## 2015-02-20 DIAGNOSIS — M1A09X0 Idiopathic chronic gout, multiple sites, without tophus (tophi): Secondary | ICD-10-CM

## 2015-02-20 DIAGNOSIS — E876 Hypokalemia: Secondary | ICD-10-CM

## 2015-02-20 DIAGNOSIS — C801 Malignant (primary) neoplasm, unspecified: Secondary | ICD-10-CM

## 2015-02-20 DIAGNOSIS — C787 Secondary malignant neoplasm of liver and intrahepatic bile duct: Secondary | ICD-10-CM

## 2015-02-20 DIAGNOSIS — R627 Adult failure to thrive: Secondary | ICD-10-CM

## 2015-02-20 LAB — BASIC METABOLIC PANEL
ANION GAP: 9 (ref 5–15)
BUN: 17 mg/dL (ref 6–20)
CALCIUM: 8.9 mg/dL (ref 8.9–10.3)
CO2: 21 mmol/L — AB (ref 22–32)
CREATININE: 0.69 mg/dL (ref 0.44–1.00)
Chloride: 110 mmol/L (ref 101–111)
Glucose, Bld: 157 mg/dL — ABNORMAL HIGH (ref 65–99)
Potassium: 4.2 mmol/L (ref 3.5–5.1)
Sodium: 140 mmol/L (ref 135–145)

## 2015-02-20 MED ORDER — LEVOTHYROXINE SODIUM 88 MCG PO TABS
88.0000 ug | ORAL_TABLET | Freq: Every day | ORAL | Status: DC
  Administered 2015-02-21: 88 ug via ORAL
  Filled 2015-02-20: qty 1

## 2015-02-20 MED ORDER — ALLOPURINOL 300 MG PO TABS
300.0000 mg | ORAL_TABLET | Freq: Every day | ORAL | Status: DC
  Administered 2015-02-20 – 2015-02-21 (×2): 300 mg via ORAL
  Filled 2015-02-20 (×2): qty 1

## 2015-02-20 NOTE — Progress Notes (Signed)
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 This was confirmed by biopsy on 08/14/2013 and imaging studies showed multifocal hepatic lesions. She developed C7 metastatic lesion discovered in August 2016.   Prior Therapy:  Status post a liver biopsy on 08/14/2013. Gemcitabine at 1000 mg per meter squared started on 09/06/2013 given weekly 3 weeks on and one week off for 6 cycles. Subsequently she received it every other week until August 2016 when she developed progression of disease with C7 metastatic lesion. She status post surgical resection of her C7 metastatic lesion done in September 2016 at [facility].  Radiation therapy to the cervical spine to be completed on 02/11/2015.  Lindsay Aguirre was hospitalized on 02/19/2015 for neck pain, poor by mouth intake, failure to thrive. A CT scan of the neck obtained on 02/19/2015 showed further progression of her disease with metastasis in the C6 and C7 vertebra.  Patient was hydrated overnight and received dexamethasone and her symptoms have improved some. His able to swallow and eat soft diet. She is actually asking for regular diet at this time. She does not report any headaches or blurry vision. Does not report any syncope or seizures. She does not report any fevers or chills or sweats. Does not report any chest pain or palpitation. Does not report any cough or hemoptysis. She does report nausea but no vomiting at this time. Last patient or diarrhea. Remaining review of systems unremarkable.   Objective:  Vital signs in last 24 hours: Temp:  [97.6 F (36.4 C)-97.7 F (36.5 C)] 97.7 F (36.5 C) (10/20 2307) Pulse Rate:  [79-95] 79 (10/20 2307) Resp:  [16-18] 16 (10/20 2307) BP: (114-133)/(54-70) 131/61 mmHg (10/20 2307) SpO2:  [95 %-99 %] 97 % (10/20 2307) Weight:  [160 lb 0.9 oz (72.6 kg)] 160 lb  0.9 oz (72.6 kg) (10/20 2307) Weight change:     Intake/Output from previous day:   Alert, awake ill-appearing woman without distress this morning. Mouth: mucous membranes moist, pharynx normal without lesions Resp: clear to auscultation bilaterally Cardio: regular rate and rhythm, S1, S2 normal, no murmur, click, rub or gallop GI: soft, non-tender; bowel sounds normal; no masses,  no organomegaly Extremities: extremities normal, atraumatic, no cyanosis or edema    Lab Results:  Recent Labs  02/19/15 1221  WBC 11.7*  HGB 11.6*  HCT 35.4*  PLT 223    BMET  Recent Labs  02/19/15 1221 02/20/15 0450  NA 139 140  K 2.9* 4.2  CL 105 110  CO2 26 21*  GLUCOSE 96 157*  BUN 15 17  CREATININE 0.73 0.69  CALCIUM 9.5 8.9    Studies/Results: Ct Soft Tissue Neck W Contrast  02/19/2015  CLINICAL DATA:  Neck pain since spine surgery in March. Cholangiocarcinoma. EXAM: CT NECK WITH CONTRAST TECHNIQUE: Multidetector CT imaging of the neck was performed using the standard protocol following the bolus administration of intravenous contrast. CONTRAST:  41mL OMNIPAQUE IOHEXOL 300 MG/ML  SOLN COMPARISON:  Cervical spine MRI 12/05/2014 FINDINGS: Pharynx and larynx: No asymmetric mass or inflammatory enhancement. Mild lower pharyngeal and laryngeal edema suspected from radiation. Salivary glands: Mild edema within the submandibular glands, likely radiation related. Thyroid: Negative Lymph nodes: No enlarged or necrotic appearing lymph nodes. Vascular: Major vessels are patent Limited intracranial: Minimal imaging is negative for metastatic disease. Visualized orbits: Negative Mastoids and visualized paranasal sinuses: Negative Skeleton: New metastasis  within the posterior aspect of the T3 vertebral body. The lesion erodes the dorsal cortex without gross extra-spinal spread. Extensive posterior element metastatic disease involving the fused C6-7 levels has been treated with decompressive  laminectomy from C4-T1 and presumed facetectomy. There is pre-existing C5-6 and C6-7 discectomies with complete bony fusion. Despite the surgery, there is progressive destruction of the bilateral C7 articular process with larger bilateral vertebral body erosion. The C6-7 foramina are compromised by tumor; assessment of the canal is not possible due to extensive streak artifact from hardware which now extends posteriorly from C3-T2. No evidence of hardware fracture or displacement. Upper chest: Thickened appearance of the upper esophagus with prominent mucosal enhancement, likely radiation related if performed. No apical pulmonary nodules. IMPRESSION: 1. Progressive C6 and C7 metastasis with bilateral C6-7 foramina involvement. Cervical canal cannot be evaluated due to CT limitations and hardware artifact. 2. New T3 vertebral body metastasis. 3. Mild inflammation in the visceral neck correlating with radiation therapy. Electronically Signed   By: Monte Fantasia M.D.   On: 02/19/2015 15:07    Medications: I have reviewed the patient's current medications.  Assessment/Plan:  64 year old woman with the following issues:  1. 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 Her CT scans on 02/17/2015 as well as 02/19/2015 were reviewed and discussed with the patient today. She has clear progression in multiple areas including liver, bone and possibly peritoneum. She is status post gemcitabine systemic chemotherapy.  Options of treatment were discussed today which include multi agent systemic chemotherapy utilizing a regimen with oxaliplatin and 5-FU or cisplatin and gemcitabine among others. I fear the risks of these regimens do not justify the benefit that will be marginal at best. I feel the rapid progression of her disease and deterioration in her performance status makes a therapeutic index of chemotherapy very low.  Oral Xeloda was also discussed and I think  will have very little benefit.  I have advised against any further chemotherapy and suggested hospice as the best approach moving forward. She will consider that and make up her mind in the near future. I think she would qualify for hospice given the rapid progression of her cancer, deterioration of performance status and limited life expectancy probably less than 6 months regardless of treatment.  2. Dehydration and failure to thrive: She responded well to IV hydration and dexamethasone. I agree with the current management.  3. Nutrition: I am not in favor of using PEG tube for feeding given her ability to get some oral nutrition and her terminal diagnosis.  All her questions are answered and I have no objections for her to be discharged in the near future and follow-up as an outpatient.     LOS: 1 day   [facility] 02/20/2015, 7:42 AM

## 2015-02-20 NOTE — Progress Notes (Signed)
Progress Note   Lindsay Aguirre:[id number] DOB: 11/07/1950 DOA: 02/19/2015 PCP: [name], FNP   Brief Narrative:   Lindsay Aguirre is an 64 y.o. 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 Mitzi Hansen) to cervical/thoracic spine area (C5-T10), s/p systemic chemotherapy (gemcitabine last given 8//16) under the care of Dr. Clelia Croft, who was admitted 02/19/15 with a 9 day history of dysphasia and odynophagia.  Imaging in the ED showed progression of cervical spine metastasis.  Assessment/Plan:   Principal Problem:  Dehydration secondary to dysphagia in the setting of cervical spine metastasis - Continue IV fluids.  - Continue Decadron to decrease inflammation in the area of her cervical spine metastasis. - Has had improvement in symptoms overnight with Decadron and IVF. - Has been able to tolerate oral liquids.  Active Problems:  Hypertension - Hold antihypertensives for now.   Hypothyroidism - Change Synthroid back to her oral dose   Gout - Resume allopurinol.   Cholangiocarcinoma (HCC) - Evaluated by Dr. Clelia Croft, does not think further chemo would be beneficial, and does not recommend PEG.   Hypokalemia - Potassium repleted.   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 No current indication for transfusion.   DVT prophylaxis - Lovenox ordered.  Family Communication: No family currently at the bedside. Disposition Plan: Home in 24-48 hours if able to advance diet successfully.  Diet advanced to soft today. Code Status:     Code Status Orders        Start     Ordered   02/19/15 2232  Full code   Continuous     02/19/15 2231        IV Access:    Port-A-Cath   Procedures and diagnostic studies:   Ct Soft Tissue Neck W Contrast  02/19/2015  CLINICAL DATA:  Neck pain since spine surgery in March. Cholangiocarcinoma. EXAM: CT NECK WITH  CONTRAST TECHNIQUE: Multidetector CT imaging of the neck was performed using the standard protocol following the bolus administration of intravenous contrast. CONTRAST:  75mL OMNIPAQUE IOHEXOL 300 MG/ML  SOLN COMPARISON:  Cervical spine MRI 12/05/2014 FINDINGS: Pharynx and larynx: No asymmetric mass or inflammatory enhancement. Mild lower pharyngeal and laryngeal edema suspected from radiation. Salivary glands: Mild edema within the submandibular glands, likely radiation related. Thyroid: Negative Lymph nodes: No enlarged or necrotic appearing lymph nodes. Vascular: Major vessels are patent Limited intracranial: Minimal imaging is negative for metastatic disease. Visualized orbits: Negative Mastoids and visualized paranasal sinuses: Negative Skeleton: New metastasis within the posterior aspect of the T3 vertebral body. The lesion erodes the dorsal cortex without gross extra-spinal spread. Extensive posterior element metastatic disease involving the fused C6-7 levels has been treated with decompressive laminectomy from C4-T1 and presumed facetectomy. There is pre-existing C5-6 and C6-7 discectomies with complete bony fusion. Despite the surgery, there is progressive destruction of the bilateral C7 articular process with larger bilateral vertebral body erosion. The C6-7 foramina are compromised by tumor; assessment of the canal is not possible due to extensive streak artifact from hardware which now extends posteriorly from C3-T2. No evidence of hardware fracture or displacement. Upper chest: Thickened appearance of the upper esophagus with prominent mucosal enhancement, likely radiation related if performed. No apical pulmonary nodules. IMPRESSION: 1. Progressive C6 and C7 metastasis with bilateral C6-7 foramina involvement. Cervical canal cannot be evaluated due to CT limitations and hardware artifact. 2. New T3 vertebral body metastasis. 3. Mild inflammation in  the visceral neck correlating with radiation therapy.  Electronically Signed   By: Marnee Spring M.D.   On: 02/19/2015 15:07     Medical Consultants:    Dr. Clelia Croft, Oncology  Anti-Infectives:   Anti-infectives    None      Subjective:   Lindsay Aguirre feels much better today. She has been able to tolerate liquids. Sore throat pain much improved. No nausea or vomiting.  Objective:    Filed Vitals:   02/19/15 1537 02/19/15 1722 02/19/15 1958 02/19/15 2307  BP: 114/58 124/70 133/67 131/61  Pulse: 90 86 92 79  Temp:   97.6 F (36.4 C) 97.7 F (36.5 C)  TempSrc:   Oral Oral  Resp: 18 18 18 16   Height:    5\' 5"  (1.651 m)  Weight:    72.6 kg (160 lb 0.9 oz)  SpO2: 97% 98% 95% 97%   No intake or output data in the 24 hours ending 02/20/15 0720 Filed Weights   02/19/15 2307  Weight: 72.6 kg (160 lb 0.9 oz)    Exam: Gen:  NAD Cardiovascular:  RRR, No M/R/G Respiratory:  Lungs CTAB Gastrointestinal:  Abdomen soft, NT/ND, + BS Extremities:  No C/E/C   Data Reviewed:    Labs: Basic Metabolic Panel:  Recent Labs Lab 02/14/15 2000 02/19/15 1221 02/20/15 0450  NA 136 139 140  K 3.5 2.9* 4.2  CL 102 105 110  CO2 25 26 21*  GLUCOSE 105* 96 157*  BUN 13 15 17   CREATININE 0.71 0.73 0.69  CALCIUM 10.0 9.5 8.9   GFR Estimated Creatinine Clearance: 69.9 mL/min (by C-G formula based on Cr of 0.69). Liver Function Tests:  Recent Labs Lab 02/19/15 1221  AST 44*  ALT 22  ALKPHOS 96  BILITOT 1.0  PROT 7.1  ALBUMIN 3.2*   Coagulation profile  Recent Labs Lab 02/19/15 1221  INR 1.32    CBC:  Recent Labs Lab 02/14/15 2000 02/19/15 1221  WBC 11.5* 11.7*  NEUTROABS 9.0* 9.7*  HGB 11.8* 11.6*  HCT 36.4 35.4*  MCV 97.6 96.5  PLT 232 223    Microbiology No results found for this or any previous visit (from the past 240 hour(s)).   Medications:   . dexamethasone  4 mg Intravenous 4 times per day  . enoxaparin (LOVENOX) injection  40 mg Subcutaneous QHS  . levothyroxine  44 mcg Intravenous  QAC breakfast  . magic mouthwash  5 mL Oral QID   Continuous Infusions: . dextrose 5 % and 0.9 % NaCl with KCl 40 mEq/L 100 mL/hr at 02/20/15 0042    Time spent: 25 minutes.   LOS: 1 day   Lindsay Aguirre  [facility] Pager [phone]. If unable to reach me by pager, please call my cell phone at [phone].  *Please refer to [email or web address], password [email or web address] to get updated schedule on who will round on this patient, as hospitalists switch teams weekly. If 7PM-7AM, please contact night-coverage at [email or web address], password [email or web address] for any overnight needs.  02/20/2015, 7:20 AM

## 2015-02-20 NOTE — Care Management Note (Signed)
Case Management Note  Patient Details  Name: ANISTEN TOMASSI MRN: [id number] Date of Birth: 1950-08-03  Subjective/Objective:    64 yo admitted with Dehydration secondary to dysphagia in the setting of cervical spine mets                Action/Plan: From home with children  Expected Discharge Date:   (UNKNOWN)               Expected Discharge Plan:  Home/Self Care  In-House Referral:     Discharge planning Services  CM Consult  Post Acute Care Choice:    Choice offered to:     DME Arranged:    DME Agency:     HH Arranged:    [place] Agency:     Status of Service:  In process, will continue to follow  Medicare Important Message Given:    Date Medicare IM Given:    Medicare IM give by:    Date Additional Medicare IM Given:    Additional Medicare Important Message give by:     If discussed at [place] of Stay Meetings, dates discussed:    Additional Comments: CM consult for Home Health needs. PT is recommending no PT follow up and no DME. Pt has PCP and insurance. This CM attempted to meet with pt to eval for needs. Pt sleeping soundly and do not want to wake. No DC needs identified during chart review or during conversation with RN. CM will continue to follow. Lynnell Catalan, RN 02/20/2015, 3:55 PM

## 2015-02-20 NOTE — Progress Notes (Signed)
Patient admitted to room 1344 from ED with sore throat and hypokalemia. Oriented to room and unit.

## 2015-02-20 NOTE — Progress Notes (Signed)
Got report from [facility] in ED.

## 2015-02-20 NOTE — Progress Notes (Signed)
Initial Nutrition Assessment  DOCUMENTATION CODES:   Not applicable  INTERVENTION:  Ensure Enlive po BID, each supplement provides 350 kcal and 20 grams of protein  NUTRITION DIAGNOSIS:   Inadequate oral intake related to poor appetite, cancer and cancer related treatments as evidenced by per patient/family report, percent weight loss.  GOAL:   Patient will meet greater than or equal to 90% of their needs  MONITOR:   Supplement acceptance, PO intake, I & O's, Labs  REASON FOR ASSESSMENT:   Consult Assessment of nutrition requirement/status  ASSESSMENT:   Lindsay Aguirre is an 64 y.o. 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 Lisbeth Renshaw) to cervical/thoracic spine area (C5-T10), s/p systemic chemotherapy (gemcitabine last given 8//16) under the care of Dr. Alen Blew, who presents with a 9 day history of worsening throat pain progressing to the inability to swallow even liquids  Consulted for patient's inability to swallow. Checked on her today, said she underwent a CT scan, they detected swelling, and it was alleviated with medication. Due to MST score patient still needs to be seen.   Pt exhibits 10%/18# weight loss in 4 months. Does not show signs of malnutrition based on NFPA. Reports appetite changes related to cancer, but nothing major, still seems to eat ok. Will provide Ensure BID to prevent further weight loss.  Diet Order:  DIET SOFT Room service appropriate?: Yes; Fluid consistency:: Thin  Skin:  Reviewed, no issues  Last BM:  10/16  Height:   Ht Readings from Last 1 Encounters:  02/19/15 5\' 5"  (1.651 m)    Weight:   Wt Readings from Last 1 Encounters:  02/19/15 160 lb 0.9 oz (72.6 kg)    Ideal Body Weight:  56.81 kg  BMI:  Body mass index is 26.63 kg/(m^2).  Estimated Nutritional Needs:   Kcal:  1800-2100 calories  Protein:  88-110 grams  Fluid:  >/=  1.8L  EDUCATION NEEDS:   No education needs identified at this time  Satira Anis. [name], MS, RD LDN After Hours/Weekend Pager [phone]

## 2015-02-20 NOTE — Evaluation (Signed)
Physical Therapy One Time Evaluation Patient Details Name: Lindsay Aguirre MRN: [id number] DOB: 1951-03-02 Today's Date: 02/20/2015   History of Present Illness  64 y.o. female with a PMH of chonangiocarcinoma diagnosed 08/14/13 with liver and spinal metastasis, s/p laminectomy 01/02/2015 at [facility] and XRT to cervical/thoracic spine area (C5-T10), s/p systemic chemotherapy and admitted for dehydration secondary to dysphagia in the setting of cervical spine metastasis  Clinical Impression  Patient evaluated by Physical Therapy with no further acute PT needs identified. All education has been completed and the patient has no further questions.  Pt mobilizing well, denies any difference in strength, denies hx of falls.  Pt reports generalized weakness prior to admission however reports feeling better at this time.  Pt encouraged to ambulate with family and/or staff during acute stay.  Pt reports family assists at home and she has equipment if she needs it later. See below for any follow-up Physical Therapy or equipment needs. PT is signing off. Thank you for this referral.     Follow Up Recommendations No PT follow up    Equipment Recommendations  None recommended by PT    Recommendations for Other Services       Precautions / Restrictions Precautions Precautions: None      Mobility  Bed Mobility Overal bed mobility: Modified Independent                Transfers Overall transfer level: Modified independent                  Ambulation/Gait Ambulation/Gait assistance: Supervision;Modified independent (Device/Increase time) Ambulation Distance (Feet): 400 Feet Assistive device: None Gait Pattern/deviations: WFL(Within Functional Limits)     General Gait Details: no unsteadiness observed, pt pushed IV pole  Stairs            Wheelchair Mobility    Modified Rankin (Stroke Patients Only)       Balance Overall balance assessment: No apparent balance  deficits (not formally assessed) (denies any falls)                                           Pertinent Vitals/Pain Pain Assessment: No/denies pain    Home Living Family/patient expects to be discharged to:: Private residence Living Arrangements: Children Available Help at Discharge: Family;Available 24 hours/day (reports family has been assisting) Type of Home: House Home Access: Level entry     Home Layout: One level Home Equipment: Bedside commode;Walker - 2 wheels      Prior Function Level of Independence: Independent         Comments: independent with mobility, reports family assisting with IADLs     Hand Dominance        Extremity/Trunk Assessment   Upper Extremity Assessment: Overall WFL for tasks assessed           Lower Extremity Assessment: Overall WFL for tasks assessed         Communication   Communication: No difficulties  Cognition Arousal/Alertness: Awake/alert Behavior During Therapy: WFL for tasks assessed/performed Overall Cognitive Status: Within Functional Limits for tasks assessed                      General Comments      Exercises        Assessment/Plan    PT Assessment Patent does not need any further PT services  PT Diagnosis Difficulty  walking   PT Problem List    PT Treatment Interventions     PT Goals (Current goals can be found in the Care Plan section) Acute Rehab PT Goals PT Goal Formulation: All assessment and education complete, DC therapy    Frequency     Barriers to discharge        Co-evaluation               End of Session   Activity Tolerance: Patient tolerated treatment well Patient left: in chair;with call bell/phone within reach;with family/visitor present           Time: 1116-1130 PT Time Calculation (min) (ACUTE ONLY): 14 min   Charges:   PT Evaluation $Initial PT Evaluation Tier I: 1 Procedure     PT G Codes:        [name],KATHrine  E 02/20/2015, 11:40 AM Zenovia Jarred, PT, DPT 02/20/2015 Pager: [phone]

## 2015-02-21 MED ORDER — MORPHINE SULFATE 10 MG/5ML PO SOLN: 5.0000 mg | ORAL | Status: DC | PRN

## 2015-02-21 MED ORDER — DEXAMETHASONE 0.5 MG/5ML PO SOLN: 4.0000 mg | Freq: Two times a day (BID) | ORAL | Status: DC

## 2015-02-21 NOTE — Discharge Summary (Signed)
Physician Discharge Summary  Lindsay Aguirre IRJ:[id number] DOB: [birth year] DOA: 02/19/2015  PCP: Chevis Pretty, FNP  Admit date: 02/19/2015 Discharge date: 02/21/2015   Recommendations for Outpatient Follow-Up:   1. The patient is being sent home on Decadron.  Will need instructions for further taper after two weeks.   Discharge Diagnosis:   Principal Problem:    Dehydration secondary to dysphagia in the setting of cervical spine metastasis Active Problems:    Hypertension    Hypothyroidism    Gout    Cholangiocarcinoma ([place])    Dysphagia    Hypokalemia    Anemia in neoplastic disease    Dysphasia    Metastasis to spinal column [facility])   Discharge disposition:  Home.    Discharge Condition: Improved.  Diet recommendation: Regular/soft with nutritional supplements.   History of Present Illness:   Lindsay Aguirre is an 64 y.o. 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 Lisbeth Renshaw) to cervical/thoracic spine area (C5-T10), s/p systemic chemotherapy (gemcitabine last given 8//16) under the care of Dr. Alen Blew, who was admitted 02/19/15 with a 9 day history of dysphasia and odynophagia. Imaging in the ED showed progression of cervical spine metastasis.  Hospital Course by Problem:   Principal Problem:  Dehydration secondary to dysphagia in the setting of cervical spine metastasis - Hydrated with resolution of dehydration.  - Responded well to Decadron, swallowing ability much improved at discharge.  Active Problems:  Hypertension - Continue home medications.   Hypothyroidism - Continue Synthroid.   Gout - Continue allopurinol.   Cholangiocarcinoma ([place]) - Evaluated by Dr. Alen Blew, does not think further chemo would be beneficial, and does not recommend PEG.   Hypokalemia - Potassium repleted.   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 No  current indication for transfusion.   Medical Consultants:    Dr. Alen Blew, Oncology   Discharge Exam:   Filed Vitals:   02/21/15 0550  BP: 134/70  Pulse: 83  Temp: 97.7 F (36.5 C)  Resp: 20   Filed Vitals:   02/20/15 0715 02/20/15 1418 02/20/15 2125 02/21/15 0550  BP: 131/71 126/67 139/69 134/70  Pulse: 81 86 85 83  Temp: 97.7 F (36.5 C) 97.4 F (36.3 C) 97.8 F (36.6 C) 97.7 F (36.5 C)  TempSrc: Oral Oral Oral Oral  Resp: 16 20 20 20   Height:      Weight:      SpO2: 97% 98% 98% 97%    Gen:  NAD Cardiovascular:  RRR, No M/R/G Respiratory: Lungs CTAB Gastrointestinal: Abdomen soft, NT/ND with normal active bowel sounds. Extremities: No C/E/C   The results of significant diagnostics from this hospitalization (including imaging, microbiology, ancillary and laboratory) are listed below for reference.     Procedures and Diagnostic Studies:   Ct Soft Tissue Neck W Contrast  02/19/2015  CLINICAL DATA:  Neck pain since spine surgery in March. Cholangiocarcinoma. EXAM: CT NECK WITH CONTRAST TECHNIQUE: Multidetector CT imaging of the neck was performed using the standard protocol following the bolus administration of intravenous contrast. CONTRAST:  56mL OMNIPAQUE IOHEXOL 300 MG/ML  SOLN COMPARISON:  Cervical spine MRI 12/05/2014 FINDINGS: Pharynx and larynx: No asymmetric mass or inflammatory enhancement. Mild lower pharyngeal and laryngeal edema suspected from radiation. Salivary glands: Mild edema within the submandibular glands, likely radiation related. Thyroid: Negative Lymph nodes: No enlarged or necrotic appearing lymph nodes. Vascular: Major vessels are patent Limited intracranial: Minimal imaging is negative for  metastatic disease. Visualized orbits: Negative Mastoids and visualized paranasal sinuses: Negative Skeleton: New metastasis within the posterior aspect of the T3 vertebral body. The lesion erodes the dorsal cortex without gross extra-spinal spread.  Extensive posterior element metastatic disease involving the fused C6-7 levels has been treated with decompressive laminectomy from C4-T1 and presumed facetectomy. There is pre-existing C5-6 and C6-7 discectomies with complete bony fusion. Despite the surgery, there is progressive destruction of the bilateral C7 articular process with larger bilateral vertebral body erosion. The C6-7 foramina are compromised by tumor; assessment of the canal is not possible due to extensive streak artifact from hardware which now extends posteriorly from C3-T2. No evidence of hardware fracture or displacement. Upper chest: Thickened appearance of the upper esophagus with prominent mucosal enhancement, likely radiation related if performed. No apical pulmonary nodules. IMPRESSION: 1. Progressive C6 and C7 metastasis with bilateral C6-7 foramina involvement. Cervical canal cannot be evaluated due to CT limitations and hardware artifact. 2. New T3 vertebral body metastasis. 3. Mild inflammation in the visceral neck correlating with radiation therapy. Electronically Signed   By: Monte Fantasia M.D.   On: 02/19/2015 15:07    Labs:   Basic Metabolic Panel:  Recent Labs Lab 02/14/15 2000 02/19/15 1221 02/20/15 0450  NA 136 139 140  K 3.5 2.9* 4.2  CL 102 105 110  CO2 25 26 21*  GLUCOSE 105* 96 157*  BUN 13 15 17   CREATININE 0.71 0.73 0.69  CALCIUM 10.0 9.5 8.9   GFR Estimated Creatinine Clearance: 69.9 mL/min (by C-G formula based on Cr of 0.69). Liver Function Tests:  Recent Labs Lab 02/19/15 1221  AST 44*  ALT 22  ALKPHOS 96  BILITOT 1.0  PROT 7.1  ALBUMIN 3.2*   Coagulation profile  Recent Labs Lab 02/19/15 1221  INR 1.32    CBC:  Recent Labs Lab 02/14/15 2000 02/19/15 1221  WBC 11.5* 11.7*  NEUTROABS 9.0* 9.7*  HGB 11.8* 11.6*  HCT 36.4 35.4*  MCV 97.6 96.5  PLT 232 223     Discharge Instructions:   Discharge Instructions    Call MD for:  extreme fatigue    Complete by:  As  directed      Call MD for:  persistant nausea and vomiting    Complete by:  As directed      Call MD for:  severe uncontrolled pain    Complete by:  As directed      Call MD for:    Complete by:  As directed   Inability to swallow.     Diet general    Complete by:  As directed      Increase activity slowly    Complete by:  As directed             Medication List    STOP taking these medications        benzonatate 200 MG capsule  Commonly known as:  TESSALON     HYDROcodone-acetaminophen 5-325 MG tablet  Commonly known as:  NORCO/VICODIN     levofloxacin 750 MG tablet  Commonly known as:  LEVAQUIN     lisinopril-hydrochlorothiazide 20-12.5 MG tablet  Commonly known as:  PRINZIDE,ZESTORETIC     prochlorperazine 25 MG suppository  Commonly known as:  COMPAZINE     senna 8.6 MG Tabs tablet  Commonly known as:  SENOKOT     UNABLE TO FIND      TAKE these medications        acetaminophen 500 MG  tablet  Commonly known as:  TYLENOL  Take 1,000 mg by mouth daily as needed for mild pain.     albuterol 108 (90 BASE) MCG/ACT inhaler  Commonly known as:  PROVENTIL HFA;VENTOLIN HFA  Inhale 2 puffs into the lungs every 6 (six) hours as needed for wheezing or shortness of breath.     allopurinol 300 MG tablet  Commonly known as:  ZYLOPRIM  TAKE 1 TABLET (300 MG TOTAL) BY MOUTH DAILY.     bifidobacterium infantis capsule  Take 1 capsule by mouth daily.     dexamethasone 0.5 MG/5ML solution  Commonly known as:  DECADRON  Take 40 mLs (4 mg total) by mouth 2 (two) times daily. Dec to 30 mL 2x/day in 1 wk, then 20 mL  2x/day 1 wk later.Then per Dr. Alen Blew.     emollient cream  Commonly known as:  BIAFINE  Apply 1 application topically daily as needed (redness).     FIRST-DUKES MOUTHWASH Susp  Use as directed 5 mLs in the mouth or throat 4 (four) times daily. With meals and at bedtime     lidocaine-prilocaine cream  Commonly known as:  EMLA  Apply 1 application  topically as needed. Apply cream, on days of treatment, to [facility] site 1-2 hours prior to treatment.     morphine 10 MG/5ML solution  Take 2.5-5 mLs (5-10 mg total) by mouth every 4 (four) hours as needed for severe pain.     ondansetron 8 MG tablet  Commonly known as:  ZOFRAN  Take 1 tablet (8 mg total) by mouth every 8 (eight) hours as needed for nausea or vomiting.     polyethylene glycol packet  Commonly known as:  MIRALAX / GLYCOLAX  Take 17 g by mouth daily as needed for mild constipation.     prochlorperazine 10 MG tablet  Commonly known as:  COMPAZINE  Take 1 tablet (10 mg total) by mouth every 6 (six) hours as needed for nausea or vomiting.     SYNTHROID 88 MCG tablet  Generic drug:  levothyroxine  TAKE 1 TABLET (88 MCG TOTAL) BY MOUTH DAILY BEFORE BREAKFAST.           Follow-up Information    Follow up with [facility], [place]. Schedule an appointment as soon as possible for a visit in 1 week.   Specialty:  Oncology   Why:  To discuss treatment options.   Contact information:   [address] [place]. [place] [address] [phone]        Time coordinating discharge: 25 minutes.  Signed:  RAMA,CHRISTINA  Pager [phone] [facility] 02/21/2015, 8:56 AM

## 2015-02-21 NOTE — Progress Notes (Signed)
Patient discharged home, all discharge medications and instructions reviewed and questions answered. Patient to be assisted to vehicle by wheelchair.  

## 2015-02-21 NOTE — Discharge Instructions (Signed)
Aspiration Precautions °Aspiration is the breathing in (inhalation) of a liquid or object into the lungs. Things that can be inhaled into the lungs include:  °· Food. °· Any type of liquid, such as drinks or saliva. °· Stomach contents, such as vomit or stomach acid. °When these things go into the lungs, damage can occur and serious complications can result, such as: °· Lung infection (pneumonia). °· Collection of infected liquid (pus) in the lungs (lung abscess). °· Death. °CAUSES °The cause of aspiration may include:  °· A lowered level of awareness (consciousness) due to: °¨ Traumatic brain injury or head injury. °¨ Stroke. °¨ Diseases of the nerves, brain, or spinal cord. °¨ Seizures. °· A problem with the gag reflex. The gag reflex protects the body from swallowing things too quickly or things that are too large. °· Medical conditions that affect swallowing. °· Conditions that affect the food pipe (esophagus). °· Acid reflux. This is when stomach acid moves into the esophagus. °· Any type of surgery where a medicine to sleep (general anesthetic) or relax (sedative) is given. °· Alcohol abuse. °· Illegal drug abuse. °· Taking medicine that causes sleepiness, confusion, or weakness. °· Aging. °· Dental problems. °· Having a feeding tube. °SIGNS AND SYMPTOMS °Symptoms of aspiration may include:  °· Coughing after swallowing food or liquids. °· Difficulty breathing. This may include: °¨ Breathing quickly. °¨ Breathing very slowly. °¨ Loud breathing. °¨ Rumbling sounds from the lungs while breathing. °· Coughing up phlegm (sputum) that: °¨ Is yellow, tan, or green. °¨ Has pieces of food in it. °¨ Is bad smelling. °· A change in voice so that it sounds scratchy. °· A change in skin color. The skin may look red or blue.    °· Fever. °· Watery eyes. °· Pain in the chest or back. °· A pained look on the face.    °· A feeling of fullness in the throat or that something is stuck in the throat. °DIAGNOSIS °Aspiration may  be diagnosed by:  °· Chest X-ray. °· Bronchoscopy. This is a surgical procedure in which a thin, flexible tube with a camera is inserted into the nose or mouth to the lungs. The health care provider can then view the lungs. °· A swallowing evaluation study to find out: °¨ A person's risk of aspiration. °¨ How difficult it is for a person to swallow. °¨ What types of foods are safe for a person to eat. °PREVENTION °If you are caring for someone who can eat and drink through his or her mouth:  °· Have the person sit in an upright position when eating food or drinking fluids, such as: °¨ Sitting up in a chair. °¨ If sitting in a chair is not possible, position the person in bed so he or she is upright. °· Remind the person to eat slowly and chew well. °· Do not distract the person. This is especially important for people with thinking or memory (cognitive) problems. °· Check the person's mouth for leftover food after eating. °· Keep the person sitting upright for 30-45 minutes after eating. °· Do not serve food or drink for at least 2 hours before bedtime. °If you are caring for someone with a feeding tube who cannot eat or drink through his or her mouth: °· Keep the person in an upright position as much as possible. °· Do not  lay the person flat if he or she is getting continuous feedings. Turn the feeding pump off if you need to lay the person flat   for any reason. °· Check feeding tube residuals as directed by your health care provider. Ask your health care provider what residual amount is too high. °General guidelines to prevent aspiration in someone you are caring for include: °· Feed small amounts of food. Do not force feed. °· Food should be thickened as directed by the person's speech pathologist. °· Use as little water as possible when brushing the person's teeth or cleaning his or her mouth. °· Provide oral care before and after meals. °· Never put food or liquids in the mouth of a person who is not fully  alert. °· Crush pills and put them in soft food such as pudding or ice cream. Some pills should not be crushed. Check with your health care provider before crushing any medicine. °SEEK MEDICAL CARE IF: °· The person has a feeding tube and the feeding tube residual amount is too high. °· The person has a fever. °· The person tries to avoid food, such as refusing to eat or be fed, or is eating less than normal. °SEEK IMMEDIATE MEDICAL CARE IF:  °· The person has trouble breathing or starts to breathe quickly. °· The person is breathing very slowly or stops breathing. °· The person coughs a lot after eating or drinking. °· The person has a long-lasting (chronic) cough. °· The person coughs up thick, yellow, or tan sputum. °MAKE SURE YOU:  °· Understand these instructions. °· Will watch the person's condition. °· Will get help right away if the person is not doing well or gets worse. °  °This information is not intended to replace advice given to you by your health care provider. Make sure you discuss any questions you have with your health care provider. °  °Document Released: 05/21/2010 Document Revised: 05/09/2014 Document Reviewed: 07/24/2013 °Elsevier Interactive Patient Education ©2016 [employer] ° °

## 2015-02-23 ENCOUNTER — Telehealth: Payer: Self-pay | Admitting: Oncology

## 2015-02-23 ENCOUNTER — Telehealth: Payer: Self-pay | Admitting: *Deleted

## 2015-02-23 ENCOUNTER — Other Ambulatory Visit: Payer: Self-pay | Admitting: Oncology

## 2015-02-23 DIAGNOSIS — C221 Intrahepatic bile duct carcinoma: Secondary | ICD-10-CM

## 2015-02-23 NOTE — Telephone Encounter (Signed)
Patient left a voice message stating,"I was discharged from the hospital and I was told to see Dr. Alen Blew this week. When can I come? Return number is [phone]."

## 2015-02-23 NOTE — Telephone Encounter (Signed)
POF send for 10/25 appointment

## 2015-02-23 NOTE — Telephone Encounter (Signed)
SW PT AND ADVISED ON oct 25 APPT....[place] KitchenPT [place] AND [place]

## 2015-02-24 ENCOUNTER — Other Ambulatory Visit (HOSPITAL_BASED_OUTPATIENT_CLINIC_OR_DEPARTMENT_OTHER): Payer: Medicare Other

## 2015-02-24 ENCOUNTER — Other Ambulatory Visit: Payer: Medicare Other

## 2015-02-24 ENCOUNTER — Ambulatory Visit (HOSPITAL_BASED_OUTPATIENT_CLINIC_OR_DEPARTMENT_OTHER): Payer: Medicare Other | Admitting: Oncology

## 2015-02-24 ENCOUNTER — Telehealth: Payer: Self-pay | Admitting: Oncology

## 2015-02-24 ENCOUNTER — Ambulatory Visit: Payer: Medicare Other | Admitting: Oncology

## 2015-02-24 VITALS — BP 146/74 | HR 88 | Temp 98.7°F | Resp 20 | Ht 66.0 in | Wt 165.0 lb

## 2015-02-24 DIAGNOSIS — C7951 Secondary malignant neoplasm of bone: Secondary | ICD-10-CM

## 2015-02-24 DIAGNOSIS — C787 Secondary malignant neoplasm of liver and intrahepatic bile duct: Secondary | ICD-10-CM

## 2015-02-24 DIAGNOSIS — C221 Intrahepatic bile duct carcinoma: Secondary | ICD-10-CM

## 2015-02-24 DIAGNOSIS — R1310 Dysphagia, unspecified: Secondary | ICD-10-CM

## 2015-02-24 DIAGNOSIS — M899 Disorder of bone, unspecified: Secondary | ICD-10-CM | POA: Diagnosis not present

## 2015-02-24 DIAGNOSIS — K5900 Constipation, unspecified: Secondary | ICD-10-CM | POA: Diagnosis not present

## 2015-02-24 DIAGNOSIS — C801 Malignant (primary) neoplasm, unspecified: Secondary | ICD-10-CM

## 2015-02-24 LAB — CBC WITH DIFFERENTIAL/PLATELET
BASO%: 0.3 % (ref 0.0–2.0)
BASOS ABS: 0 10*3/uL (ref 0.0–0.1)
EOS%: 0 % (ref 0.0–7.0)
Eosinophils Absolute: 0 10*3/uL (ref 0.0–0.5)
HEMATOCRIT: 37.4 % (ref 34.8–46.6)
HGB: 12.3 g/dL (ref 11.6–15.9)
LYMPH#: 0.6 10*3/uL — AB (ref 0.9–3.3)
LYMPH%: 3.6 % — ABNORMAL LOW (ref 14.0–49.7)
MCH: 31.5 pg (ref 25.1–34.0)
MCHC: 33 g/dL (ref 31.5–36.0)
MCV: 95.3 fL (ref 79.5–101.0)
MONO#: 0.9 10*3/uL (ref 0.1–0.9)
MONO%: 5.4 % (ref 0.0–14.0)
NEUT#: 15.2 10*3/uL — ABNORMAL HIGH (ref 1.5–6.5)
NEUT%: 90.7 % — AB (ref 38.4–76.8)
PLATELETS: 248 10*3/uL (ref 145–400)
RBC: 3.92 10*6/uL (ref 3.70–5.45)
RDW: 16.5 % — ABNORMAL HIGH (ref 11.2–14.5)
WBC: 16.8 10*3/uL — ABNORMAL HIGH (ref 3.9–10.3)

## 2015-02-24 LAB — COMPREHENSIVE METABOLIC PANEL (CC13)
ALT: 47 U/L (ref 0–55)
ANION GAP: 7 meq/L (ref 3–11)
AST: 46 U/L — AB (ref 5–34)
Albumin: 2.9 g/dL — ABNORMAL LOW (ref 3.5–5.0)
Alkaline Phosphatase: 124 U/L (ref 40–150)
BUN: 19 mg/dL (ref 7.0–26.0)
CALCIUM: 8.8 mg/dL (ref 8.4–10.4)
CHLORIDE: 109 meq/L (ref 98–109)
CO2: 24 mEq/L (ref 22–29)
Creatinine: 0.6 mg/dL (ref 0.6–1.1)
Glucose: 115 mg/dl (ref 70–140)
POTASSIUM: 3.2 meq/L — AB (ref 3.5–5.1)
Sodium: 140 mEq/L (ref 136–145)
Total Bilirubin: 0.46 mg/dL (ref 0.20–1.20)
Total Protein: 6.4 g/dL (ref 6.4–8.3)

## 2015-02-24 MED ORDER — DEXAMETHASONE 0.5 MG/5ML PO SOLN: ORAL | Status: AC

## 2015-02-24 MED ORDER — ONDANSETRON HCL 8 MG PO TABS: 8.0000 mg | ORAL_TABLET | Freq: Three times a day (TID) | ORAL | Status: DC | PRN

## 2015-02-24 MED ORDER — ONDANSETRON HCL 8 MG PO TABS: 8.0000 mg | ORAL_TABLET | Freq: Three times a day (TID) | ORAL | Status: AC | PRN

## 2015-02-24 MED ORDER — MORPHINE SULFATE 15 MG PO TABS: 15.0000 mg | ORAL_TABLET | ORAL | Status: DC | PRN

## 2015-02-24 NOTE — Progress Notes (Signed)
Hematology and Oncology Follow Up Visit  Lindsay Aguirre [id number] 06-Sep-1950 64 y.o. 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 This was confirmed by biopsy on 08/14/2013 and imaging studies showed multifocal hepatic lesions. She developed C7 metastatic lesion discovered in August 2016.   Prior Therapy:  Status post a liver biopsy on 08/14/2013. Gemcitabine at 1000 mg per meter squared started on 09/06/2013 given weekly 3 weeks on and one week off for 6 cycles. Subsequently she received it every other week until August 2016 when she developed progression of disease with C7 metastatic lesion. She status post surgical resection of her C7 metastatic lesion done in September 2016 at [facility]. Radiation therapy to the cervical spine to be completed on 02/11/2015.   Current therapy: Under evaluation for her different salvage chemotherapy.   Interim History: Lindsay Aguirre presents for follow up with her family. Since her last visit, she was hospitalized briefly on October 21 till October 23 with symptoms of neck pain, nausea, vomiting and failure to thrive. After a brief hospitalization that include aggressive IV hydration, dexamethasone and supportive measures she has significant improvement at this time. Her pain is reasonably controlled although she was prescribed liquid morphine she has not been really using much pain medication at this time. She is able to swallow much better with the help of dexamethasone. She is ambulating short distances without any falls or syncope.  She reports no fever, chills, shortness of breath chest pain, night sweats or diarrhea.  Has not reported any hematochezia or melena. She continues to perform her activities of daily living without any decline. She has not reported any syncope  or neurological changes. She has not reported any arthralgias or myalgias. Did not report any skin rashes or lesions. Did not report any lymphadenopathy or petechiae.   Remainder of her review of system is unremarkable.  Medications: I have reviewed the patient's current medications. Unchanged by my review. Current Outpatient Prescriptions  Medication Sig Dispense Refill  . acetaminophen (TYLENOL) 500 MG tablet Take 1,000 mg by mouth daily as needed for mild pain.    [name] albuterol (PROVENTIL HFA;VENTOLIN HFA) 108 (90 BASE) MCG/ACT inhaler Inhale 2 puffs into the lungs every 6 (six) hours as needed for wheezing or shortness of breath. (Patient not taking: Reported on 02/19/2015) 1 Inhaler 0  . allopurinol (ZYLOPRIM) 300 MG tablet TAKE 1 TABLET (300 MG TOTAL) BY MOUTH DAILY. 30 tablet 2  . bifidobacterium infantis (ALIGN) capsule Take 1 capsule by mouth daily.    [name] dexamethasone (DECADRON) 0.5 MG/5ML solution Take 5 cc BID. 500 mL 0  . Diphenhyd-Hydrocort-Nystatin (FIRST-DUKES MOUTHWASH) SUSP Use as directed 5 mLs in the mouth or throat 4 (four) times daily. With meals and at bedtime    . emollient (BIAFINE) cream Apply 1 application topically daily as needed (redness).     [name] lidocaine-prilocaine (EMLA) cream Apply 1 application topically as needed. Apply cream, on days of treatment, to [facility] site 1-2 hours prior to treatment. 30 g 0  . morphine (MSIR) 15 MG tablet Take 1 tablet (15 mg total) by mouth every 4 (four) hours as needed for severe pain. 30 tablet 0  . morphine 10 MG/5ML solution Take 2.5-5 mLs (5-10 mg total) by mouth every 4 (four) hours as needed for severe pain. 15 mL 0  . ondansetron (ZOFRAN) 8 MG tablet Take  1 tablet (8 mg total) by mouth every 8 (eight) hours as needed for nausea or vomiting. 30 tablet 1  . ondansetron (ZOFRAN) 8 MG tablet Take 1 tablet (8 mg total) by mouth every 8 (eight) hours as needed for nausea or vomiting. 30 tablet 1  . polyethylene glycol (MIRALAX / GLYCOLAX)  packet Take 17 g by mouth daily as needed for mild constipation.    . prochlorperazine (COMPAZINE) 10 MG tablet Take 1 tablet (10 mg total) by mouth every 6 (six) hours as needed for nausea or vomiting. 30 tablet 1  . SYNTHROID 88 MCG tablet TAKE 1 TABLET (88 MCG TOTAL) BY MOUTH DAILY BEFORE BREAKFAST. 30 tablet 0   No current facility-administered medications for this visit.     Allergies:  Allergies  Allergen Reactions  . Livalo [Pitavastatin]     Leg cramps  . Rosuvastatin Calcium     Other reaction(s): Cramps (ALLERGY/intolerance) Leg Cramps   . Simvastatin     Leg cramps  . Statins     Leg Cramps     Past Medical History, Surgical history, Social history, and Family History were reviewed and updated.  Physical Exam: Blood pressure 146/74, pulse 88, temperature 98.7 F (37.1 C), temperature source Oral, resp. rate 20, height 5\' 6"  (1.676 m), weight 165 lb (74.844 kg), SpO2 99 %. ECOG: 1 General appearance: alert and cooperative medically ill appearing woman without distress today. Head: Normocephalic, without obvious abnormality Neck: no adenopathy Lymph nodes: Cervical, supraclavicular, and axillary nodes normal. Heart:regular rate and rhythm, S1, S2.  Lung:chest clear, no wheezing. Dullness to percussion. Abdomin: soft, non-tender, without masses or organomegaly no shifting dullness or ascites. EXT:no erythema, induration, or edema Skin: Slight ecchymosis noted on the upper extremities. Neurologically: No deficits with good upper extremity strength.   Lab Results: Lab Results  Component Value Date   WBC 16.8* 02/24/2015   HGB 12.3 02/24/2015   HCT 37.4 02/24/2015   MCV 95.3 02/24/2015   PLT 248 02/24/2015     Chemistry      Component Value Date/Time   NA 140 02/24/2015 1052   NA 140 02/20/2015 0450   NA 141 07/10/2014 1306   K 3.2* 02/24/2015 1052   K 4.2 02/20/2015 0450   CL 110 02/20/2015 0450   CO2 24 02/24/2015 1052   CO2 21* 02/20/2015 0450   BUN  19.0 02/24/2015 1052   BUN 17 02/20/2015 0450   BUN 16 07/10/2014 1306   CREATININE 0.6 02/24/2015 1052   CREATININE 0.69 02/20/2015 0450   CREATININE 0.81 10/24/2012 1203      Component Value Date/Time   CALCIUM 8.8 02/24/2015 1052   CALCIUM 8.9 02/20/2015 0450   ALKPHOS 124 02/24/2015 1052   ALKPHOS 96 02/19/2015 1221   AST 46* 02/24/2015 1052   AST 44* 02/19/2015 1221   ALT 47 02/24/2015 1052   ALT 22 02/19/2015 1221   BILITOT 0.46 02/24/2015 1052   BILITOT 1.0 02/19/2015 1221   BILITOT 0.4 07/10/2014 1306      EXAM: CT CHEST, ABDOMEN, AND PELVIS WITH CONTRAST  TECHNIQUE: Multidetector CT imaging of the chest, abdomen and pelvis was performed following the standard protocol during bolus administration of intravenous contrast.  CONTRAST: 175mL OMNIPAQUE IOHEXOL 300 MG/ML SOLN  COMPARISON: CT the abdomen and pelvis 10/07/2014. Chest CT 10/29/2013.  FINDINGS: CT CHEST FINDINGS  Mediastinum/Nodes: Heart size is normal. Small amount of new pericardial fluid and/or thickening, unlikely to be of any hemodynamic significance at this time, however, there  is a small focus of potential pericardial calcification identified on image 72 of series 4, which could indicate some pericardial metastasis. There is atherosclerosis of the thoracic aorta, the great vessels of the mediastinum and the coronary arteries, including calcified atherosclerotic plaque in the left main and left anterior descending coronary arteries. No pathologically enlarged mediastinal or hilar lymph nodes. Esophagus is unremarkable in appearance. No axillary lymphadenopathy. Right internal jugular single-lumen porta cath with tip terminating at the superior cavoatrial junction.  Lungs/Pleura: New 5 mm nodule in the posterior aspect of the left lower lobe (image 36 of series 7). 3 mm nodule in the lateral segment of the right middle lobe (image 30 of series 7) is unchanged. No acute consolidative  airspace disease. No pleural effusions.  Musculoskeletal: New lytic lesion measuring 11 mm in the posterior aspect of the T3 vertebral body (image 12 of series 7). Orthopedic fixation hardware at T1 and T2.  CT ABDOMEN PELVIS FINDINGS  Hepatobiliary: Marked progression of disease throughout the liver. Specifically, there is now a very large confluent mass occupying the majority of the left lobe of the liver as well as much of segment 5. Previously described hypervascular lesion at the junction of segments 2 and 4A in the left lobe of the liver is now inseparable from the larger mass. Previously noted index lesion in segment 8 has significantly increased in size (image 35 of series 2), currently 3.9 x 3.0 cm (previously 2.1 x 1.2 cm). Previously noted lesion in segment 7 of the liver has also significantly increased in size (image 21 of series 2), currently measuring 4.2 x 5.1 cm (previously 3.2 x 2.8 cm). Multiple lung new lesions are noted as well. No intra or extrahepatic biliary ductal dilatation. Gallbladder is unremarkable in appearance.  Pancreas: No pancreatic mass. No pancreatic ductal dilatation. No pancreatic or peripancreatic fluid or inflammatory changes.  Spleen: Spleen is borderline enlarged measuring 13.0 x 5.7 x 8.9 cm (estimated splenic volume of 330 mL).  Adrenals/Urinary Tract: Bilateral kidneys and bilateral adrenal glands are normal in appearance. No hydroureteronephrosis. Urinary bladder is normal in appearance.  Stomach/Bowel: Normal appearance of the stomach. No pathologic dilatation of small bowel or colon.  Vascular/Lymphatic: Atherosclerosis throughout the abdominal and pelvic vasculature, without evidence of aneurysm or dissection. Portal vein is patent. Splenic vein is also patent, but there are numerous splenorenal collaterals on the left side. Additionally, there are numerous collaterals between the left renal vein and the left gonadal  vein. Numerous prominent upper abdominal lymph nodes measuring up to 1 cm in short axis in the portacaval nodal station.  Reproductive: Uterus and right ovary are unremarkable. 3.4 x 3.1 cm well-defined intermediate attenuation lesion in the left adnexal region is similar to the prior study, likely a mildly proteinaceous left ovarian cyst.  Other: Haziness of the omentum immediately anterior to the liver, best appreciated on image 86 of series 4, increased compared to the prior examination. Small volume of ascites. No pneumoperitoneum.  Musculoskeletal: Status post PLIF from L3-L5 with interbody grafts at L3-L4 and L4-L5. There are no aggressive appearing lytic or blastic lesions noted in the visualized portions of the skeleton.  IMPRESSION: 1. Today's study demonstrates severe progression of disease with marked enlargement of numerous masses and nodules in the liver. In addition, there is a new osseous metastasis in the posterior aspect of the T3 vertebral body. There is also a new left lower lobe pulmonary nodule measuring 5 mm (image 36 of series 7) which is nonspecific but  suspicious, and there is a small amount of pericardial fluid and/or thickening with some pericardial enhancement on today's study which suggests potential metastatic disease to the pericardium. There is also slight haziness in the omentum overlying the liver, which could simply represent some edema, however, the possibility of early intraperitoneal spread of disease with omental involvement is not excluded. 2. Small volume of ascites. 3. Borderline splenomegaly. Worsening portosystemic collateralization, as evidenced by enlarging left-sided splenorenal collaterals and collaterals into the left gonadal vein. Portal vein remains patent at this time. 4. Additional incidental findings,   64 year old woman with the following issues:  1. Multifocal hepatic metastasis of an adenocarcinoma llikely represents  pancreaticobiliary origin. She has been on gemcitabine and have clearly progressed.  CT scan from 02/17/2015 was reviewed today again with the patient and her family. Options of treatment were reviewed including different salvage chemotherapy with Xeloda, intravenous chemotherapy with FOLFOX versus hospice care. Her performance status is have actually improved since her discharge from the hospital and she is willing to try chemotherapy. I feel that her prognosis is rather poor but her her performance status is not prohibitive to start salvage chemotherapy. She understands that if she has poor tolerance to this regimen, hospice would be to wait ago.  Risks and benefits of FOLFOX chemotherapy were reviewed and include nausea, vomiting, myelosuppression, neutropenia neutropenic sepsis as well as a use of an infusion pump. He is agreeable to proceed at this time plan to start in the near future.  2. IV access: Port-A-Cath in place without any complications.   3. Antiemetics: Continue Zofran as needed. This was refilled today.  4. Constipation: She uses MiraLax with excellent response. Continue to encourage that especially with pain medication.  5. C7 metastatic lesion: Status post surgical resection and postoperative radiation therapy.  6. Dysphagia: Related to her previous radiation. I recommended continued dexamethasone oral liquid with slow taper as she is doing. We'll leave her at a lower dose of 0.5 mg daily in the next few weeks.  7. Bone pain: Prescription for morphine sulfate was given to patient today.  8. Followup: Next week for the start of chemotherapy.  [facility], MD 10/25/201611:47 AM

## 2015-02-24 NOTE — Telephone Encounter (Signed)
Gave and printed appt sched and avs fo rpt; for NOV  °

## 2015-02-24 NOTE — Addendum Note (Signed)
Addended by: Randolm Idol on: 02/24/2015 12:23 PM   Modules accepted: Medications

## 2015-02-26 ENCOUNTER — Other Ambulatory Visit: Payer: Self-pay | Admitting: Nurse Practitioner

## 2015-02-26 NOTE — Telephone Encounter (Signed)
Last seen 01/14/15  Dr Dettinger  Last thyroid 12/02/13

## 2015-03-03 ENCOUNTER — Other Ambulatory Visit: Payer: Self-pay | Admitting: *Deleted

## 2015-03-03 DIAGNOSIS — C221 Intrahepatic bile duct carcinoma: Secondary | ICD-10-CM

## 2015-03-03 DIAGNOSIS — C7951 Secondary malignant neoplasm of bone: Secondary | ICD-10-CM

## 2015-03-03 DIAGNOSIS — E876 Hypokalemia: Secondary | ICD-10-CM

## 2015-03-03 DIAGNOSIS — D630 Anemia in neoplastic disease: Secondary | ICD-10-CM

## 2015-03-04 ENCOUNTER — Other Ambulatory Visit: Payer: Self-pay | Admitting: *Deleted

## 2015-03-04 ENCOUNTER — Ambulatory Visit (HOSPITAL_BASED_OUTPATIENT_CLINIC_OR_DEPARTMENT_OTHER): Payer: Medicare Other | Admitting: Nurse Practitioner

## 2015-03-04 ENCOUNTER — Ambulatory Visit (HOSPITAL_BASED_OUTPATIENT_CLINIC_OR_DEPARTMENT_OTHER): Payer: Medicare Other

## 2015-03-04 ENCOUNTER — Other Ambulatory Visit (HOSPITAL_BASED_OUTPATIENT_CLINIC_OR_DEPARTMENT_OTHER): Payer: Medicare Other

## 2015-03-04 DIAGNOSIS — E46 Unspecified protein-calorie malnutrition: Secondary | ICD-10-CM

## 2015-03-04 DIAGNOSIS — C787 Secondary malignant neoplasm of liver and intrahepatic bile duct: Secondary | ICD-10-CM

## 2015-03-04 DIAGNOSIS — C221 Intrahepatic bile duct carcinoma: Secondary | ICD-10-CM

## 2015-03-04 DIAGNOSIS — E876 Hypokalemia: Secondary | ICD-10-CM

## 2015-03-04 DIAGNOSIS — R609 Edema, unspecified: Secondary | ICD-10-CM

## 2015-03-04 DIAGNOSIS — C7951 Secondary malignant neoplasm of bone: Secondary | ICD-10-CM | POA: Diagnosis not present

## 2015-03-04 DIAGNOSIS — Z5111 Encounter for antineoplastic chemotherapy: Secondary | ICD-10-CM | POA: Diagnosis present

## 2015-03-04 DIAGNOSIS — B3781 Candidal esophagitis: Secondary | ICD-10-CM

## 2015-03-04 DIAGNOSIS — C801 Malignant (primary) neoplasm, unspecified: Secondary | ICD-10-CM

## 2015-03-04 DIAGNOSIS — R601 Generalized edema: Secondary | ICD-10-CM

## 2015-03-04 DIAGNOSIS — E8809 Other disorders of plasma-protein metabolism, not elsewhere classified: Secondary | ICD-10-CM

## 2015-03-04 DIAGNOSIS — D630 Anemia in neoplastic disease: Secondary | ICD-10-CM

## 2015-03-04 LAB — CBC WITH DIFFERENTIAL/PLATELET
BASO%: 0.1 % (ref 0.0–2.0)
Basophils Absolute: 0 10*3/uL (ref 0.0–0.1)
EOS%: 0.1 % (ref 0.0–7.0)
Eosinophils Absolute: 0 10*3/uL (ref 0.0–0.5)
HCT: 39.5 % (ref 34.8–46.6)
HEMOGLOBIN: 12.7 g/dL (ref 11.6–15.9)
LYMPH%: 2.4 % — AB (ref 14.0–49.7)
MCH: 30.7 pg (ref 25.1–34.0)
MCHC: 32.1 g/dL (ref 31.5–36.0)
MCV: 95.8 fL (ref 79.5–101.0)
MONO#: 1.4 10*3/uL — AB (ref 0.1–0.9)
MONO%: 5 % (ref 0.0–14.0)
NEUT%: 92.4 % — ABNORMAL HIGH (ref 38.4–76.8)
NEUTROS ABS: 25.9 10*3/uL — AB (ref 1.5–6.5)
Platelets: 157 10*3/uL (ref 145–400)
RBC: 4.13 10*6/uL (ref 3.70–5.45)
RDW: 17.3 % — AB (ref 11.2–14.5)
WBC: 28 10*3/uL — AB (ref 3.9–10.3)
lymph#: 0.7 10*3/uL — ABNORMAL LOW (ref 0.9–3.3)

## 2015-03-04 LAB — COMPREHENSIVE METABOLIC PANEL (CC13)
ALT: 34 U/L (ref 0–55)
AST: 35 U/L — ABNORMAL HIGH (ref 5–34)
Albumin: 2.5 g/dL — ABNORMAL LOW (ref 3.5–5.0)
Alkaline Phosphatase: 173 U/L — ABNORMAL HIGH (ref 40–150)
Anion Gap: 10 mEq/L (ref 3–11)
BILIRUBIN TOTAL: 0.77 mg/dL (ref 0.20–1.20)
BUN: 17.1 mg/dL (ref 7.0–26.0)
CO2: 29 meq/L (ref 22–29)
CREATININE: 0.7 mg/dL (ref 0.6–1.1)
Calcium: 8.9 mg/dL (ref 8.4–10.4)
Chloride: 101 mEq/L (ref 98–109)
EGFR: >90 mL/min/{1.73_m2} (ref >90)
GLUCOSE: 171 mg/dL — AB (ref 70–140)
Potassium: 2.9 mEq/L — CL (ref 3.5–5.1)
SODIUM: 140 meq/L (ref 136–145)
TOTAL PROTEIN: 5.8 g/dL — AB (ref 6.4–8.3)

## 2015-03-04 MED ORDER — LEUCOVORIN CALCIUM INJECTION 350 MG
400.0000 mg/m2 | Freq: Once | INTRAVENOUS | Status: AC
  Administered 2015-03-04: 748 mg via INTRAVENOUS
  Filled 2015-03-04: qty 37.4

## 2015-03-04 MED ORDER — FLUOROURACIL CHEMO INJECTION 2.5 GM/50ML
400.0000 mg/m2 | Freq: Once | INTRAVENOUS | Status: AC
  Administered 2015-03-04: 750 mg via INTRAVENOUS
  Filled 2015-03-04: qty 15

## 2015-03-04 MED ORDER — DEXTROSE 5 % IV SOLN
Freq: Once | INTRAVENOUS | Status: AC
  Administered 2015-03-04: 13:00:00 via INTRAVENOUS

## 2015-03-04 MED ORDER — FLUCONAZOLE 100 MG PO TABS: 100.0000 mg | ORAL_TABLET | Freq: Every day | ORAL | Status: DC

## 2015-03-04 MED ORDER — SUCRALFATE 1 G PO TABS: 1.0000 g | ORAL_TABLET | Freq: Three times a day (TID) | ORAL | Status: AC

## 2015-03-04 MED ORDER — TEMAZEPAM 15 MG PO CAPS: 15.0000 mg | ORAL_CAPSULE | Freq: Every evening | ORAL | Status: AC | PRN

## 2015-03-04 MED ORDER — SODIUM CHLORIDE 0.9 % IV SOLN
2400.0000 mg/m2 | INTRAVENOUS | Status: DC
  Administered 2015-03-04: 4500 mg via INTRAVENOUS
  Filled 2015-03-04: qty 90

## 2015-03-04 MED ORDER — POTASSIUM CHLORIDE CRYS ER 20 MEQ PO TBCR: 20.0000 meq | EXTENDED_RELEASE_TABLET | Freq: Two times a day (BID) | ORAL | Status: AC

## 2015-03-04 MED ORDER — OXALIPLATIN CHEMO INJECTION 100 MG/20ML
60.0000 mg/m2 | Freq: Once | INTRAVENOUS | Status: AC
  Administered 2015-03-04: 110 mg via INTRAVENOUS
  Filled 2015-03-04: qty 20

## 2015-03-04 MED ORDER — SODIUM CHLORIDE 0.9 % IV SOLN
Freq: Once | INTRAVENOUS | Status: AC
  Administered 2015-03-04: 13:00:00 via INTRAVENOUS
  Filled 2015-03-04: qty 4

## 2015-03-04 NOTE — Telephone Encounter (Signed)
Patient is aware that potassium level was 2.9 today. Prescription e-scribed to her pharmacy. Patient verbalized understanding of taking medication.

## 2015-03-04 NOTE — Patient Instructions (Signed)
[facility] Discharge Instructions for Patients Receiving Chemotherapy  Today you received the following chemotherapy agents FOLFOX.   To help prevent nausea and vomiting after your treatment, we encourage you to take your nausea medication as directed.    If you develop nausea and vomiting that is not controlled by your nausea medication, call the clinic.   BELOW ARE SYMPTOMS THAT SHOULD BE REPORTED IMMEDIATELY:  *FEVER GREATER THAN 100.5 F  *CHILLS WITH OR WITHOUT FEVER  NAUSEA AND VOMITING THAT IS NOT CONTROLLED WITH YOUR NAUSEA MEDICATION  *UNUSUAL SHORTNESS OF BREATH  *UNUSUAL BRUISING OR BLEEDING  TENDERNESS IN MOUTH AND THROAT WITH OR WITHOUT PRESENCE OF ULCERS  *URINARY PROBLEMS  *BOWEL PROBLEMS  UNUSUAL RASH Items with * indicate a potential emergency and should be followed up as soon as possible.  Feel free to call the clinic you have any questions or concerns. The clinic phone number is [phone].  Please show the CHEMO ALERT CARD at check-in to the Emergency Department and triage nurse.   

## 2015-03-05 ENCOUNTER — Encounter: Payer: Self-pay | Admitting: Nurse Practitioner

## 2015-03-05 DIAGNOSIS — E46 Unspecified protein-calorie malnutrition: Secondary | ICD-10-CM | POA: Insufficient documentation

## 2015-03-05 DIAGNOSIS — R609 Edema, unspecified: Secondary | ICD-10-CM | POA: Insufficient documentation

## 2015-03-05 DIAGNOSIS — B3781 Candidal esophagitis: Secondary | ICD-10-CM | POA: Insufficient documentation

## 2015-03-05 NOTE — Assessment & Plan Note (Signed)
Pt c/o mouth sensitivity and sore throat. States that she is still able to eat and drink with no issues.   On exam- pt with thick white coating to tongue and to posterior oropharnyx.    Will prescribe diflucan orally and carafate as well.  Will also prescribe magic mouthwash with both lidocaine and nysatin as well.

## 2015-03-05 NOTE — Assessment & Plan Note (Signed)
Pt presented to the cancer center today to receive her first cycle of Folfox chemotherapy. Blood counts reveal WBC 28.0, ANC 25.9, hgb 12.7, and plt 157.   Pt is scheduled to return for labs and next cycle of chemo on 03/08/15.

## 2015-03-05 NOTE — Progress Notes (Signed)
SYMPTOM MANAGEMENT CLINIC   HPI: Lindsay Aguirre 64 y.o. 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 Here today to initiate Folfox chemo.   Pt reports generalized edema secondary to her tapering dex dose. She is currently taking dex 5 mg daily.   She is also c/o oral sensitivity and thick white coating to tongue.   She denies any fever or chills.    HPI  ROS  Past Medical History  Diagnosis Date  . PONV (postoperative nausea and vomiting)   . Hypertension   . Hypothyroidism   . Arthritis   . Gout   . Neuromuscular disorder ([place])   . Hyperlipidemia   . Family history of anesthesia complication     '" MY OLDEST SON [place] ME "  . Cholangiocarcinoma ([place]) 08/05/2013  . Diverticulosis of large intestine without hemorrhage 03/05/2014    Past Surgical History  Procedure Laterality Date  . Cervical fusion  '99 & '03  . Rotator cuff surgery  '08    right  . Cataract extraction w/phaco  12/13/2010    Procedure: CATARACT EXTRACTION PHACO AND INTRAOCULAR LENS PLACEMENT (IOC);  Surgeon: Tonny Branch;  Location: [facility];  Service: Ophthalmology;  Laterality: Right;  CDE: 8.94  . Eye surgery    . Back surgery      2009  . Back surgery  2011    lumbar surgery  . Tubal ligation  1979  . Rotator cuff repair Left 04/09/2013    DR Marlou Sa  . Shoulder arthroscopy with subacromial decompression and open rotator c Left 04/09/2013    Procedure: SHOULDER ARTHROSCOPY WITH SUBACROMIAL DECOMPRESSION AND MINI OPEN ROTATOR CUFF REPAIR, POSSIBLE BICEP TENDONODESIS,  ;  Surgeon: Meredith Pel, MD;  Location: [facility];  Service: Orthopedics;  Laterality: Left;    has Spondylolisthesis, grade 2; Hypertension; Hyperlipidemia; Hypothyroidism; Gout; Cholangiocarcinoma ([place]); Osseous metastasis ([place]); Dysphagia; Dehydration secondary to dysphagia in the setting of cervical spine metastasis; Hypokalemia; Anemia in neoplastic disease; Dysphasia; 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    is allergic to livalo; rosuvastatin calcium; simvastatin; and statins.    Medication List       This list is accurate as of: 03/04/15 11:59 PM.  Always use your most recent med list.               acetaminophen 500 MG tablet  Commonly known as:  TYLENOL  Take 1,000 mg by mouth daily as needed for mild pain.     albuterol 108 (90 BASE) MCG/ACT inhaler  Commonly known as:  PROVENTIL HFA;VENTOLIN HFA  Inhale 2 puffs into the lungs every 6 (six) hours as needed for wheezing or shortness of breath.     allopurinol 300 MG tablet  Commonly known as:  ZYLOPRIM  TAKE 1 TABLET (300 MG TOTAL) BY MOUTH DAILY.     bifidobacterium infantis capsule  Take 1 capsule by mouth daily.     dexamethasone 0.5 MG/5ML solution  Commonly known as:  DECADRON  Take 5 cc BID.     emollient cream  Commonly known as:  BIAFINE  Apply 1 application topically daily as needed (redness).     FIRST-DUKES MOUTHWASH Susp  Use as directed 5 mLs in the mouth or throat 4 (four) times daily. With meals and at bedtime     fluconazole 100 MG tablet  Commonly known as:  DIFLUCAN  Take 1 tablet (100 mg total)  by mouth daily.     lidocaine-prilocaine cream  Commonly known as:  EMLA  Apply 1 application topically as needed. Apply cream, on days of treatment, to [facility] site 1-2 hours prior to treatment.     morphine 10 MG/5ML solution  Take 2.5-5 mLs (5-10 mg total) by mouth every 4 (four) hours as needed for severe pain.     morphine 15 MG tablet  Commonly known as:  MSIR  Take 1 tablet (15 mg total) by mouth every 4 (four) hours as needed for severe pain.     ondansetron 8 MG tablet  Commonly known as:  ZOFRAN  Take 1 tablet (8 mg total) by mouth every 8 (eight) hours as needed for nausea or vomiting.     ondansetron 8 MG tablet  Commonly known as:  ZOFRAN  Take 1 tablet (8 mg  total) by mouth every 8 (eight) hours as needed for nausea or vomiting.     polyethylene glycol packet  Commonly known as:  MIRALAX / GLYCOLAX  Take 17 g by mouth daily as needed for mild constipation.     potassium chloride SA 20 MEQ tablet  Commonly known as:  K-DUR,KLOR-CON  Take 1 tablet (20 mEq total) by mouth 2 (two) times daily.     prochlorperazine 10 MG tablet  Commonly known as:  COMPAZINE  Take 1 tablet (10 mg total) by mouth every 6 (six) hours as needed for nausea or vomiting.     sucralfate 1 G tablet  Commonly known as:  CARAFATE  Take 1 tablet (1 g total) by mouth 4 (four) times daily -  with meals and at bedtime.     SYNTHROID 88 MCG tablet  Generic drug:  levothyroxine  TAKE 1 TABLET (88 MCG TOTAL) BY MOUTH DAILY BEFORE BREAKFAST.     temazepam 15 MG capsule  Commonly known as:  RESTORIL  Take 1 capsule (15 mg total) by mouth at bedtime as needed for sleep.         PHYSICAL EXAMINATION  Oncology Vitals 02/24/2015 02/21/2015 02/20/2015 02/20/2015 02/20/2015 02/19/2015 02/19/2015  Height 168 cm - - - - 165 cm -  Weight 74.844 kg - - - - 72.6 kg -  Weight (lbs) 165 lbs - - - - 160 lbs 1 oz -  BMI (kg/m2) 26.63 kg/m2 - - - - 26.63 kg/m2 -  Temp 98.7 97.7 97.8 97.4 97.7 97.7 -  Pulse 88 83 85 86 81 79 88  Resp '20 20 20 20 16 16 ' -  SpO2 99 97 98 98 97 97 -  BSA (m2) 1.87 m2 - - - - 1.82 m2 -   BP Readings from Last 3 Encounters:  02/24/15 146/74  02/21/15 134/70  02/14/15 123/69    Physical Exam  Constitutional: She is oriented to person, place, and time and well-developed, well-nourished, and in no distress.  HENT:  Head: Normocephalic and atraumatic.  Think white coating to tongue; with white patches to oral mucosa and posterior oropharnyx. No specific oral lesions noted.   Eyes: Conjunctivae and EOM are normal. Pupils are equal, round, and reactive to light. Right eye exhibits no discharge. Left eye exhibits no discharge. No scleral icterus.  Neck:  Normal range of motion.  Pulmonary/Chest: Effort normal. No respiratory distress.  Musculoskeletal: Normal range of motion. She exhibits edema. She exhibits no tenderness.  Mild generalized edema to entire body; with slight increase edema to extremities and face.   Neurological: She is alert and oriented to  person, place, and time.  Skin: Skin is warm and dry.  Psychiatric: Affect normal.  Nursing note and vitals reviewed.   LABORATORY DATA:. Appointment on 03/04/2015  Component Date Value Ref Range Status  . WBC 03/04/2015 28.0* 3.9 - 10.3 10e3/uL Final  . NEUT# 03/04/2015 25.9* 1.5 - 6.5 10e3/uL Final  . HGB 03/04/2015 12.7  11.6 - 15.9 g/dL Final  . HCT 03/04/2015 39.5  34.8 - 46.6 % Final  . Platelets 03/04/2015 157  145 - 400 10e3/uL Final  . MCV 03/04/2015 95.8  79.5 - 101.0 fL Final  . MCH 03/04/2015 30.7  25.1 - 34.0 pg Final  . MCHC 03/04/2015 32.1  31.5 - 36.0 g/dL Final  . RBC 03/04/2015 4.13  3.70 - 5.45 10e6/uL Final  . RDW 03/04/2015 17.3* 11.2 - 14.5 % Final  . lymph# 03/04/2015 0.7* 0.9 - 3.3 10e3/uL Final  . MONO# 03/04/2015 1.4* 0.1 - 0.9 10e3/uL Final  . Eosinophils Absolute 03/04/2015 0.0  0.0 - 0.5 10e3/uL Final  . Basophils Absolute 03/04/2015 0.0  0.0 - 0.1 10e3/uL Final  . NEUT% 03/04/2015 92.4* 38.4 - 76.8 % Final  . LYMPH% 03/04/2015 2.4* 14.0 - 49.7 % Final  . MONO% 03/04/2015 5.0  0.0 - 14.0 % Final  . EOS% 03/04/2015 0.1  0.0 - 7.0 % Final  . BASO% 03/04/2015 0.1  0.0 - 2.0 % Final  . Sodium 03/04/2015 140  136 - 145 mEq/L Final  . Potassium 03/04/2015 2.9* 3.5 - 5.1 mEq/L Final  . Chloride 03/04/2015 101  98 - 109 mEq/L Final  . CO2 03/04/2015 29  22 - 29 mEq/L Final  . Glucose 03/04/2015 171* 70 - 140 mg/dl Final   Glucose reference range is for nonfasting patients. Fasting glucose reference range is 70- 100.  [name] BUN 03/04/2015 17.1  7.0 - 26.0 mg/dL Final  . Creatinine 03/04/2015 0.7  0.6 - 1.1 mg/dL Final  . Total Bilirubin 03/04/2015 0.77  0.20  - 1.20 mg/dL Final  . Alkaline Phosphatase 03/04/2015 173* 40 - 150 U/L Final  . AST 03/04/2015 35* 5 - 34 U/L Final  . ALT 03/04/2015 34  0 - 55 U/L Final  . Total Protein 03/04/2015 5.8* 6.4 - 8.3 g/dL Final  . Albumin 03/04/2015 2.5* 3.5 - 5.0 g/dL Final  . Calcium 03/04/2015 8.9  8.4 - 10.4 mg/dL Final  . Anion Gap 03/04/2015 10  3 - 11 mEq/L Final  . EGFR 03/04/2015 >90  >90 ml/min/1.73 m2 Final   eGFR is calculated using the CKD-EPI Creatinine Equation (2009)     RADIOGRAPHIC STUDIES: No results found.  ASSESSMENT/PLAN:    Hypokalemia K+ down to 2.9 today. Pt prescribed KCL 20 meq PO BID for replacement. Will continue to monitor.   Hypoalbuminemia due to protein-calorie malnutrition (HCC) Albumin currently 2.5.  Advised pt to increase her protein intake.   Hyperphosphatemia Alk phos increased from 124 to 173.  Most likely secondary to disease.   Edema Pt continues to decrease her dexamethasone per orders; and is currently taking Dex 5 mg per day.  She reports increased generalized edema since taking the dex. Pt feels that the edema has improved since decreasing the dex.   On exam pt appears with mild generalized edema to her face and all extremities. Will continue to monitor closely.   Cholangiocarcinoma ([place]) Pt presented to the cancer center today to receive her first cycle of Folfox chemotherapy. Blood counts reveal WBC 28.0, ANC 25.9, hgb 12.7, and plt  157.   Pt is scheduled to return for labs and next cycle of chemo on 03/08/15.  Candidal esophagitis (HCC) Pt c/o mouth sensitivity and sore throat. States that she is still able to eat and drink with no issues.   On exam- pt with thick white coating to tongue and to posterior oropharnyx.    Will prescribe diflucan orally and carafate as well.  Will also prescribe magic mouthwash with both lidocaine and nysatin as well.   Patient stated understanding of all instructions; and was in agreement with this plan of care.  The patient knows to call the clinic with any problems, questions or concerns.   Review/collaboration with Dr. Alen Blew regarding all aspects of patient's visit today.   Total time spent with patient was 25 minutes;  with greater than 75 percent of that time spent in face to face counseling regarding patient's symptoms,  and coordination of care and follow up.  Disclaimer:This dictation was prepared with Dragon/digital dictation along with [employer]. Any transcriptional errors that result from this process are unintentional.  Drue Second, NP 03/05/2015

## 2015-03-05 NOTE — Assessment & Plan Note (Signed)
Alk phos increased from 124 to 173.  Most likely secondary to disease.  

## 2015-03-05 NOTE — Assessment & Plan Note (Signed)
K+ down to 2.9 today. Pt prescribed KCL 20 meq PO BID for replacement. Will continue to monitor.

## 2015-03-05 NOTE — Assessment & Plan Note (Signed)
Albumin currently 2.5.  Advised pt to increase her protein intake.

## 2015-03-05 NOTE — Assessment & Plan Note (Signed)
Pt continues to decrease her dexamethasone per orders; and is currently taking Dex 5 mg per day.  She reports increased generalized edema since taking the dex. Pt feels that the edema has improved since decreasing the dex.   On exam pt appears with mild generalized edema to her face and all extremities. Will continue to monitor closely.

## 2015-03-06 ENCOUNTER — Ambulatory Visit (HOSPITAL_BASED_OUTPATIENT_CLINIC_OR_DEPARTMENT_OTHER): Payer: Medicare Other

## 2015-03-06 ENCOUNTER — Telehealth: Payer: Self-pay | Admitting: *Deleted

## 2015-03-06 VITALS — BP 127/56 | HR 75 | Temp 98.1°F | Resp 18

## 2015-03-06 DIAGNOSIS — Z452 Encounter for adjustment and management of vascular access device: Secondary | ICD-10-CM

## 2015-03-06 DIAGNOSIS — C221 Intrahepatic bile duct carcinoma: Secondary | ICD-10-CM

## 2015-03-06 MED ORDER — HEPARIN SOD (PORK) LOCK FLUSH 100 UNIT/ML IV SOLN
500.0000 [IU] | Freq: Once | INTRAVENOUS | Status: AC | PRN
  Administered 2015-03-06: 500 [IU]
  Filled 2015-03-06: qty 5

## 2015-03-06 MED ORDER — SODIUM CHLORIDE 0.9 % IJ SOLN
10.0000 mL | INTRAMUSCULAR | Status: DC | PRN
  Administered 2015-03-06: 10 mL
  Filled 2015-03-06: qty 10

## 2015-03-06 NOTE — Telephone Encounter (Signed)
Spoke to pt- edema has decreased and pt is feeling much better. Pt states she has an appt today and then has a week off, she is looking forward to the week holiday from chemo. Pt understands to ask today's nurse if she has any questions or concerns.

## 2015-03-06 NOTE — Progress Notes (Signed)
Pt states no nausea, no pain, no dyspnea, a little constipation she is using softeners for. She is eating and drinking.

## 2015-03-07 ENCOUNTER — Ambulatory Visit (INDEPENDENT_AMBULATORY_CARE_PROVIDER_SITE_OTHER): Payer: Medicare Other | Admitting: Family Medicine

## 2015-03-07 VITALS — BP 152/86 | HR 94 | Temp 98.7°F | Ht 66.0 in | Wt 178.2 lb

## 2015-03-07 DIAGNOSIS — H6122 Impacted cerumen, left ear: Secondary | ICD-10-CM | POA: Diagnosis not present

## 2015-03-07 DIAGNOSIS — H65111 Acute and subacute allergic otitis media (mucoid) (sanguinous) (serous), right ear: Secondary | ICD-10-CM

## 2015-03-07 MED ORDER — AMOXICILLIN 500 MG PO CAPS: 500.0000 mg | ORAL_CAPSULE | Freq: Three times a day (TID) | ORAL | Status: DC

## 2015-03-07 NOTE — Patient Instructions (Signed)
Drink plenty of fluids Take antibiotic as directed until completed Use Debrox eardrops in the left ear canal 3-4 drops nightly for 3 nights wait 1 week and repeat this process and a few days after that come by the office to the back door and let the nurse irrigate the left ear canal to remove the wax

## 2015-03-07 NOTE — Progress Notes (Signed)
Subjective:    Patient ID: Lindsay Aguirre, female    DOB: October 07, 1950, 64 y.o.   MRN: [id number]  HPI Patient is here today for cough, chest congestion, and fluid in ear. Patient is currently undergoing cancer treatments for liver cancer.    Review of Systems  HENT: Positive for congestion and ear pain.   Respiratory: Positive for cough.   All other systems reviewed and are negative.      Patient Active Problem List   Diagnosis Date Noted  . Candidal esophagitis ([place]) 03/05/2015  . Edema 03/05/2015  . Hypoalbuminemia due to protein-calorie malnutrition ([place]) 03/05/2015  . Hyperphosphatemia 03/05/2015  . Dysphagia 02/19/2015  . Dehydration secondary to dysphagia in the setting of cervical spine metastasis 02/19/2015  . Hypokalemia 02/19/2015  . 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 Dysphasia 02/19/2015  . 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 Cholangiocarcinoma ([place]) 08/05/2013  . Hypertension 10/24/2012  . Hyperlipidemia 10/24/2012  . Hypothyroidism 10/24/2012  . Gout 10/24/2012  . Spondylolisthesis, grade 2 03/31/2011   Outpatient Encounter Prescriptions as of 03/07/2015  Medication Sig  . acetaminophen (TYLENOL) 500 MG tablet Take 1,000 mg by mouth daily as needed for mild pain.  [name] albuterol (PROVENTIL HFA;VENTOLIN HFA) 108 (90 BASE) MCG/ACT inhaler Inhale 2 puffs into the lungs every 6 (six) hours as needed for wheezing or shortness of breath.  . allopurinol (ZYLOPRIM) 300 MG tablet TAKE 1 TABLET (300 MG TOTAL) BY MOUTH DAILY.  . bifidobacterium infantis (ALIGN) capsule Take 1 capsule by mouth daily.  [name] dexamethasone (DECADRON) 0.5 MG/5ML solution Take 5 cc BID.  [name] Diphenhyd-Hydrocort-Nystatin (FIRST-DUKES MOUTHWASH) SUSP Use as directed 5 mLs in the mouth or throat 4 (four) times daily. With meals and at bedtime  . emollient (BIAFINE) cream Apply 1 application topically daily as needed (redness).   .  fluconazole (DIFLUCAN) 100 MG tablet Take 1 tablet (100 mg total) by mouth daily.  [name] lidocaine-prilocaine (EMLA) cream Apply 1 application topically as needed. Apply cream, on days of treatment, to [facility] site 1-2 hours prior to treatment.  [name] morphine (MSIR) 15 MG tablet Take 1 tablet (15 mg total) by mouth every 4 (four) hours as needed for severe pain.  [name] morphine 10 MG/5ML solution Take 2.5-5 mLs (5-10 mg total) by mouth every 4 (four) hours as needed for severe pain.  [name] ondansetron (ZOFRAN) 8 MG tablet Take 1 tablet (8 mg total) by mouth every 8 (eight) hours as needed for nausea or vomiting.  . ondansetron (ZOFRAN) 8 MG tablet Take 1 tablet (8 mg total) by mouth every 8 (eight) hours as needed for nausea or vomiting.  . polyethylene glycol (MIRALAX / GLYCOLAX) packet Take 17 g by mouth daily as needed for mild constipation.  . potassium chloride SA (K-DUR,KLOR-CON) 20 MEQ tablet Take 1 tablet (20 mEq total) by mouth 2 (two) times daily.  . prochlorperazine (COMPAZINE) 10 MG tablet Take 1 tablet (10 mg total) by mouth every 6 (six) hours as needed for nausea or vomiting.  . sucralfate (CARAFATE) 1 G tablet Take 1 tablet (1 g total) by mouth 4 (four) times daily -  with meals and at bedtime.  [name] SYNTHROID 88 MCG tablet TAKE 1 TABLET (88 MCG TOTAL) BY MOUTH DAILY BEFORE BREAKFAST.  [name] temazepam (RESTORIL) 15 MG capsule Take 1 capsule (15 mg total) by mouth at bedtime as needed for sleep.   No facility-administered encounter medications on file as of 03/07/2015.  Objective:   Physical Exam  Constitutional: No distress.  HENT:  Head: Normocephalic and atraumatic.  Mouth/Throat: Oropharynx is clear and moist. No oropharyngeal exudate.  Nasal congestion bilaterally Right TM red and swollen Impacted cerumen left ear canal  Eyes: Conjunctivae and EOM are normal. Pupils are equal, round, and reactive to light. Right eye exhibits no discharge. Left eye exhibits no discharge. Scleral icterus is  present.  Neck: Normal range of motion. Neck supple. No thyromegaly present.  Cardiovascular: Normal rate and regular rhythm.   No murmur heard. Pulmonary/Chest: Effort normal. She has no wheezes. She has no rales.  Musculoskeletal: Normal range of motion.  Patient is weak from chemotherapy and needed assistance with getting on the table  Lymphadenopathy:    She has no cervical adenopathy.  Neurological: She is alert.  Skin: Skin is warm and dry. There is pallor.  Psychiatric: She has a normal mood and affect. Her behavior is normal. Judgment and thought content normal.  Nursing note and vitals reviewed.  BP 152/86 mmHg  Pulse 94  Temp(Src) 98.7 F (37.1 C) (Oral)  Ht 5\' 6"  (1.676 m)  Wt 178 lb 3.2 oz (80.831 kg)  BMI 28.78 kg/m2        Assessment & Plan:  1. Acute mucoid otitis media of right ear -Drink plenty of fluids - amoxicillin (AMOXIL) 500 MG capsule; Take 1 capsule (500 mg total) by mouth 3 (three) times daily.  Dispense: 30 capsule; Refill: 0  2. Left ear impacted cerumen -Use Debrox as directed and come by in 10-14 days and let the nurse irrigate the left ear canal to remove the cerumen  Patient Instructions  Drink plenty of fluids Take antibiotic as directed until completed Use Debrox eardrops in the left ear canal 3-4 drops nightly for 3 nights wait 1 week and repeat this process and a few days after that come by the office to the back door and let the nurse irrigate the left ear canal to remove the wax   Arrie Senate MD

## 2015-03-10 ENCOUNTER — Encounter: Payer: Self-pay | Admitting: *Deleted

## 2015-03-10 ENCOUNTER — Emergency Department (HOSPITAL_COMMUNITY): Payer: Medicare Other

## 2015-03-10 ENCOUNTER — Inpatient Hospital Stay (HOSPITAL_COMMUNITY): Payer: Medicare Other

## 2015-03-10 ENCOUNTER — Inpatient Hospital Stay (HOSPITAL_COMMUNITY)
Admission: EM | Admit: 2015-03-10 | Discharge: 2015-03-13 | DRG: 872 | Disposition: A | Discharge status: Home Health | Payer: Medicare Other | Attending: Internal Medicine | Admitting: Internal Medicine

## 2015-03-10 ENCOUNTER — Encounter (HOSPITAL_COMMUNITY): Payer: Self-pay | Admitting: Emergency Medicine

## 2015-03-10 DIAGNOSIS — G4700 Insomnia, unspecified: Secondary | ICD-10-CM | POA: Diagnosis present

## 2015-03-10 DIAGNOSIS — H1030 Unspecified acute conjunctivitis, unspecified eye: Secondary | ICD-10-CM | POA: Diagnosis present

## 2015-03-10 DIAGNOSIS — C259 Malignant neoplasm of pancreas, unspecified: Secondary | ICD-10-CM | POA: Diagnosis present

## 2015-03-10 DIAGNOSIS — C7951 Secondary malignant neoplasm of bone: Secondary | ICD-10-CM | POA: Diagnosis present

## 2015-03-10 DIAGNOSIS — E46 Unspecified protein-calorie malnutrition: Secondary | ICD-10-CM | POA: Diagnosis present

## 2015-03-10 DIAGNOSIS — M109 Gout, unspecified: Secondary | ICD-10-CM | POA: Diagnosis present

## 2015-03-10 DIAGNOSIS — E039 Hypothyroidism, unspecified: Secondary | ICD-10-CM | POA: Diagnosis present

## 2015-03-10 DIAGNOSIS — Z833 Family history of diabetes mellitus: Secondary | ICD-10-CM

## 2015-03-10 DIAGNOSIS — C800 Disseminated malignant neoplasm, unspecified: Secondary | ICD-10-CM | POA: Diagnosis not present

## 2015-03-10 DIAGNOSIS — Z823 Family history of stroke: Secondary | ICD-10-CM | POA: Diagnosis not present

## 2015-03-10 DIAGNOSIS — C221 Intrahepatic bile duct carcinoma: Secondary | ICD-10-CM | POA: Diagnosis present

## 2015-03-10 DIAGNOSIS — R05 Cough: Secondary | ICD-10-CM | POA: Diagnosis present

## 2015-03-10 DIAGNOSIS — R21 Rash and other nonspecific skin eruption: Secondary | ICD-10-CM | POA: Diagnosis present

## 2015-03-10 DIAGNOSIS — Z6828 Body mass index (BMI) 28.0-28.9, adult: Secondary | ICD-10-CM | POA: Diagnosis not present

## 2015-03-10 DIAGNOSIS — I10 Essential (primary) hypertension: Secondary | ICD-10-CM | POA: Diagnosis present

## 2015-03-10 DIAGNOSIS — E860 Dehydration: Secondary | ICD-10-CM | POA: Diagnosis present

## 2015-03-10 DIAGNOSIS — H6691 Otitis media, unspecified, right ear: Secondary | ICD-10-CM | POA: Diagnosis present

## 2015-03-10 DIAGNOSIS — Z7952 Long term (current) use of systemic steroids: Secondary | ICD-10-CM

## 2015-03-10 DIAGNOSIS — C787 Secondary malignant neoplasm of liver and intrahepatic bile duct: Secondary | ICD-10-CM | POA: Diagnosis present

## 2015-03-10 DIAGNOSIS — H6690 Otitis media, unspecified, unspecified ear: Secondary | ICD-10-CM | POA: Diagnosis present

## 2015-03-10 DIAGNOSIS — Z79899 Other long term (current) drug therapy: Secondary | ICD-10-CM | POA: Diagnosis not present

## 2015-03-10 DIAGNOSIS — R42 Dizziness and giddiness: Secondary | ICD-10-CM | POA: Insufficient documentation

## 2015-03-10 DIAGNOSIS — D735 Infarction of spleen: Secondary | ICD-10-CM | POA: Diagnosis present

## 2015-03-10 DIAGNOSIS — R180 Malignant ascites: Secondary | ICD-10-CM | POA: Diagnosis present

## 2015-03-10 DIAGNOSIS — E876 Hypokalemia: Secondary | ICD-10-CM | POA: Diagnosis present

## 2015-03-10 DIAGNOSIS — Z888 Allergy status to other drugs, medicaments and biological substances status: Secondary | ICD-10-CM | POA: Diagnosis not present

## 2015-03-10 DIAGNOSIS — D630 Anemia in neoplastic disease: Secondary | ICD-10-CM | POA: Diagnosis present

## 2015-03-10 DIAGNOSIS — R188 Other ascites: Secondary | ICD-10-CM | POA: Diagnosis present

## 2015-03-10 DIAGNOSIS — C801 Malignant (primary) neoplasm, unspecified: Secondary | ICD-10-CM | POA: Diagnosis not present

## 2015-03-10 DIAGNOSIS — Z79891 Long term (current) use of opiate analgesic: Secondary | ICD-10-CM

## 2015-03-10 DIAGNOSIS — D6869 Other thrombophilia: Secondary | ICD-10-CM | POA: Diagnosis present

## 2015-03-10 DIAGNOSIS — R1011 Right upper quadrant pain: Secondary | ICD-10-CM | POA: Diagnosis present

## 2015-03-10 DIAGNOSIS — M1990 Unspecified osteoarthritis, unspecified site: Secondary | ICD-10-CM | POA: Diagnosis present

## 2015-03-10 DIAGNOSIS — K5730 Diverticulosis of large intestine without perforation or abscess without bleeding: Secondary | ICD-10-CM | POA: Diagnosis present

## 2015-03-10 DIAGNOSIS — R6881 Early satiety: Secondary | ICD-10-CM | POA: Diagnosis present

## 2015-03-10 DIAGNOSIS — E8809 Other disorders of plasma-protein metabolism, not elsewhere classified: Secondary | ICD-10-CM | POA: Diagnosis present

## 2015-03-10 DIAGNOSIS — K1230 Oral mucositis (ulcerative), unspecified: Secondary | ICD-10-CM | POA: Diagnosis present

## 2015-03-10 DIAGNOSIS — A419 Sepsis, unspecified organism: Secondary | ICD-10-CM | POA: Diagnosis present

## 2015-03-10 DIAGNOSIS — Z8249 Family history of ischemic heart disease and other diseases of the circulatory system: Secondary | ICD-10-CM | POA: Diagnosis not present

## 2015-03-10 DIAGNOSIS — E785 Hyperlipidemia, unspecified: Secondary | ICD-10-CM | POA: Diagnosis present

## 2015-03-10 DIAGNOSIS — R059 Cough, unspecified: Secondary | ICD-10-CM | POA: Diagnosis present

## 2015-03-10 DIAGNOSIS — R1310 Dysphagia, unspecified: Secondary | ICD-10-CM | POA: Diagnosis present

## 2015-03-10 LAB — COMPREHENSIVE METABOLIC PANEL
ALT: 25 U/L (ref 14–54)
ANION GAP: 8 (ref 5–15)
AST: 35 U/L (ref 15–41)
Albumin: 2.4 g/dL — ABNORMAL LOW (ref 3.5–5.0)
Alkaline Phosphatase: 126 U/L (ref 38–126)
BUN: 12 mg/dL (ref 6–20)
CALCIUM: 8.3 mg/dL — AB (ref 8.9–10.3)
CO2: 27 mmol/L (ref 22–32)
Chloride: 102 mmol/L (ref 101–111)
Creatinine, Ser: 0.51 mg/dL (ref 0.44–1.00)
GLUCOSE: 106 mg/dL — AB (ref 65–99)
Potassium: 2.9 mmol/L — ABNORMAL LOW (ref 3.5–5.1)
SODIUM: 137 mmol/L (ref 135–145)
TOTAL PROTEIN: 5 g/dL — AB (ref 6.5–8.1)
Total Bilirubin: 1.1 mg/dL (ref 0.3–1.2)

## 2015-03-10 LAB — CBC WITH DIFFERENTIAL/PLATELET
BASOS PCT: 0 %
Basophils Absolute: 0 10*3/uL (ref 0.0–0.1)
EOS ABS: 0.2 10*3/uL (ref 0.0–0.7)
Eosinophils Relative: 2 %
HEMATOCRIT: 33.8 % — AB (ref 36.0–46.0)
HEMOGLOBIN: 11.2 g/dL — AB (ref 12.0–15.0)
LYMPHS ABS: 0.8 10*3/uL (ref 0.7–4.0)
Lymphocytes Relative: 5 %
MCH: 31.6 pg (ref 26.0–34.0)
MCHC: 33.1 g/dL (ref 30.0–36.0)
MCV: 95.5 fL (ref 78.0–100.0)
MONOS PCT: 3 %
Monocytes Absolute: 0.5 10*3/uL (ref 0.1–1.0)
NEUTROS ABS: 14 10*3/uL — AB (ref 1.7–7.7)
NEUTROS PCT: 91 %
PLATELETS: 114 10*3/uL — AB (ref 150–400)
RBC: 3.54 MIL/uL — AB (ref 3.87–5.11)
RDW: 16.9 % — AB (ref 11.5–15.5)
WBC: 15.5 10*3/uL — AB (ref 4.0–10.5)

## 2015-03-10 LAB — URINALYSIS, ROUTINE W REFLEX MICROSCOPIC
BILIRUBIN URINE: NEGATIVE
Glucose, UA: NEGATIVE mg/dL
HGB URINE DIPSTICK: NEGATIVE
KETONES UR: NEGATIVE mg/dL
Leukocytes, UA: NEGATIVE
NITRITE: NEGATIVE
PH: 7.5 (ref 5.0–8.0)
Protein, ur: NEGATIVE mg/dL
Specific Gravity, Urine: 1.009 (ref 1.005–1.030)
Urobilinogen, UA: 1 mg/dL (ref 0.0–1.0)

## 2015-03-10 LAB — TSH: TSH: 2.558 u[IU]/mL (ref 0.350–4.500)

## 2015-03-10 LAB — I-STAT CG4 LACTIC ACID, ED
LACTIC ACID, VENOUS: 4 mmol/L — AB (ref 0.5–2.0)
LACTIC ACID, VENOUS: 3.74 mmol/L — AB (ref 0.5–2.0)
Lactic Acid, Venous: 3.12 mmol/L (ref 0.5–2.0)

## 2015-03-10 LAB — I-STAT CHEM 8, ED
BUN: 11 mg/dL (ref 6–20)
CALCIUM ION: 1.12 mmol/L — AB (ref 1.13–1.30)
CHLORIDE: 98 mmol/L — AB (ref 101–111)
Creatinine, Ser: 0.4 mg/dL — ABNORMAL LOW (ref 0.44–1.00)
GLUCOSE: 100 mg/dL — AB (ref 65–99)
HCT: 37 % (ref 36.0–46.0)
HEMOGLOBIN: 12.6 g/dL (ref 12.0–15.0)
Potassium: 2.9 mmol/L — ABNORMAL LOW (ref 3.5–5.1)
SODIUM: 139 mmol/L (ref 135–145)
TCO2: 25 mmol/L (ref 0–100)

## 2015-03-10 LAB — PROTIME-INR
INR: 1.39 (ref 0.00–1.49)
Prothrombin Time: 17.1 seconds — ABNORMAL HIGH (ref 11.6–15.2)

## 2015-03-10 LAB — INFLUENZA PANEL BY PCR (TYPE A & B)
H1N1 flu by pcr: NOT DETECTED
INFLAPCR: NEGATIVE
Influenza B By PCR: NEGATIVE

## 2015-03-10 LAB — MAGNESIUM
MAGNESIUM: 1.7 mg/dL (ref 1.7–2.4)
MAGNESIUM: 2.2 mg/dL (ref 1.7–2.4)

## 2015-03-10 LAB — APTT: APTT: 31 s (ref 24–37)

## 2015-03-10 LAB — PROCALCITONIN: Procalcitonin: 1.31 ng/mL

## 2015-03-10 LAB — LIPASE, BLOOD: LIPASE: 25 U/L (ref 11–51)

## 2015-03-10 MED ORDER — VANCOMYCIN HCL 10 G IV SOLR
1500.0000 mg | INTRAVENOUS | Status: AC
  Administered 2015-03-10: 1500 mg via INTRAVENOUS
  Filled 2015-03-10: qty 1500

## 2015-03-10 MED ORDER — DEXAMETHASONE 1 MG/ML PO CONC
0.5000 mg | Freq: Two times a day (BID) | ORAL | Status: DC
  Administered 2015-03-10 – 2015-03-13 (×6): 0.5 mg via ORAL
  Filled 2015-03-10 (×6): qty 0.5

## 2015-03-10 MED ORDER — SODIUM CHLORIDE 0.9 % IV BOLUS (SEPSIS)
1250.0000 mL | Freq: Once | INTRAVENOUS | Status: AC
  Administered 2015-03-10: 1250 mL via INTRAVENOUS

## 2015-03-10 MED ORDER — MORPHINE SULFATE 15 MG PO TABS
15.0000 mg | ORAL_TABLET | ORAL | Status: DC | PRN
  Administered 2015-03-11 – 2015-03-13 (×3): 15 mg via ORAL
  Filled 2015-03-10 (×3): qty 1

## 2015-03-10 MED ORDER — MAGNESIUM SULFATE 4 GM/100ML IV SOLN
4.0000 g | Freq: Once | INTRAVENOUS | Status: AC
  Administered 2015-03-10: 4 g via INTRAVENOUS
  Filled 2015-03-10: qty 100

## 2015-03-10 MED ORDER — DEXTROSE 5 % IV SOLN: 2.0000 g | INTRAVENOUS | Status: DC

## 2015-03-10 MED ORDER — PIPERACILLIN-TAZOBACTAM 3.375 G IVPB 30 MIN
3.3750 g | Freq: Once | INTRAVENOUS | Status: AC
  Administered 2015-03-10: 3.375 g via INTRAVENOUS
  Filled 2015-03-10: qty 50

## 2015-03-10 MED ORDER — SODIUM CHLORIDE 0.9 % IV SOLN: INTRAVENOUS | Status: DC

## 2015-03-10 MED ORDER — ALBUTEROL SULFATE (2.5 MG/3ML) 0.083% IN NEBU: 2.5000 mg | INHALATION_SOLUTION | RESPIRATORY_TRACT | Status: DC | PRN

## 2015-03-10 MED ORDER — DEXTROSE 5 % IV SOLN
2.0000 g | INTRAVENOUS | Status: DC
  Administered 2015-03-10 – 2015-03-12 (×3): 2 g via INTRAVENOUS
  Filled 2015-03-10 (×3): qty 2

## 2015-03-10 MED ORDER — ACETAMINOPHEN 325 MG PO TABS: 325.0000 mg | ORAL_TABLET | Freq: Four times a day (QID) | ORAL | Status: DC | PRN

## 2015-03-10 MED ORDER — ALIGN PO CAPS
1.0000 | ORAL_CAPSULE | Freq: Every day | ORAL | Status: DC
  Administered 2015-03-10 – 2015-03-13 (×4): 1 via ORAL
  Filled 2015-03-10 (×4): qty 1

## 2015-03-10 MED ORDER — SODIUM CHLORIDE 0.9 % IV SOLN
INTRAVENOUS | Status: AC
  Administered 2015-03-10 – 2015-03-12 (×3): via INTRAVENOUS

## 2015-03-10 MED ORDER — SUCRALFATE 1 G PO TABS
1.0000 g | ORAL_TABLET | Freq: Three times a day (TID) | ORAL | Status: DC
  Administered 2015-03-10 – 2015-03-13 (×10): 1 g via ORAL
  Filled 2015-03-10 (×15): qty 1

## 2015-03-10 MED ORDER — POLYETHYLENE GLYCOL 3350 17 G PO PACK
17.0000 g | PACK | Freq: Every day | ORAL | Status: DC | PRN
  Administered 2015-03-11: 17 g via ORAL
  Filled 2015-03-10: qty 1

## 2015-03-10 MED ORDER — POTASSIUM CHLORIDE 10 MEQ/100ML IV SOLN
10.0000 meq | Freq: Once | INTRAVENOUS | Status: AC
  Administered 2015-03-10: 10 meq via INTRAVENOUS
  Filled 2015-03-10: qty 100

## 2015-03-10 MED ORDER — SODIUM CHLORIDE 0.9 % IV SOLN
1500.0000 mg | Freq: Once | INTRAVENOUS | Status: DC
  Filled 2015-03-10: qty 1500

## 2015-03-10 MED ORDER — IPRATROPIUM BROMIDE 0.02 % IN SOLN: 0.5000 mg | RESPIRATORY_TRACT | Status: DC | PRN

## 2015-03-10 MED ORDER — IOHEXOL 300 MG/ML  SOLN
100.0000 mL | Freq: Once | INTRAMUSCULAR | Status: AC | PRN
  Administered 2015-03-10: 100 mL via INTRAVENOUS

## 2015-03-10 MED ORDER — MORPHINE SULFATE (PF) 4 MG/ML IV SOLN
4.0000 mg | Freq: Once | INTRAVENOUS | Status: AC
  Administered 2015-03-10: 4 mg via INTRAVENOUS
  Filled 2015-03-10: qty 1

## 2015-03-10 MED ORDER — ONDANSETRON HCL 4 MG/2ML IJ SOLN
4.0000 mg | Freq: Four times a day (QID) | INTRAMUSCULAR | Status: DC | PRN
  Administered 2015-03-10: 4 mg via INTRAVENOUS
  Filled 2015-03-10: qty 2

## 2015-03-10 MED ORDER — DEXAMETHASONE 0.5 MG/5ML PO SOLN
0.5000 mg | Freq: Two times a day (BID) | ORAL | Status: DC
  Filled 2015-03-10: qty 5

## 2015-03-10 MED ORDER — SODIUM CHLORIDE 0.9 % IV BOLUS (SEPSIS)
1000.0000 mL | Freq: Once | INTRAVENOUS | Status: AC
  Administered 2015-03-10: 1000 mL via INTRAVENOUS

## 2015-03-10 MED ORDER — DOCUSATE SODIUM 100 MG PO CAPS
100.0000 mg | ORAL_CAPSULE | Freq: Two times a day (BID) | ORAL | Status: DC
Start: 2015-03-10 — End: 2015-03-13
  Administered 2015-03-10 – 2015-03-13 (×6): 100 mg via ORAL
  Filled 2015-03-10 (×8): qty 1

## 2015-03-10 MED ORDER — OSELTAMIVIR PHOSPHATE 75 MG PO CAPS
75.0000 mg | ORAL_CAPSULE | Freq: Two times a day (BID) | ORAL | Status: DC
  Administered 2015-03-10 – 2015-03-11 (×3): 75 mg via ORAL
  Filled 2015-03-10 (×5): qty 1

## 2015-03-10 MED ORDER — VANCOMYCIN HCL IN DEXTROSE 1-5 GM/200ML-% IV SOLN
1000.0000 mg | Freq: Two times a day (BID) | INTRAVENOUS | Status: DC
  Administered 2015-03-11 – 2015-03-13 (×5): 1000 mg via INTRAVENOUS
  Filled 2015-03-10 (×5): qty 200

## 2015-03-10 MED ORDER — ALLOPURINOL 300 MG PO TABS
300.0000 mg | ORAL_TABLET | Freq: Every day | ORAL | Status: DC
  Administered 2015-03-11 – 2015-03-13 (×3): 300 mg via ORAL
  Filled 2015-03-10 (×3): qty 1

## 2015-03-10 MED ORDER — LEVOTHYROXINE SODIUM 88 MCG PO TABS
88.0000 ug | ORAL_TABLET | Freq: Every day | ORAL | Status: DC
  Administered 2015-03-11 – 2015-03-13 (×3): 88 ug via ORAL
  Filled 2015-03-10 (×3): qty 1

## 2015-03-10 MED ORDER — TEMAZEPAM 15 MG PO CAPS: 15.0000 mg | ORAL_CAPSULE | Freq: Every evening | ORAL | Status: DC | PRN

## 2015-03-10 MED ORDER — POTASSIUM CHLORIDE CRYS ER 20 MEQ PO TBCR
40.0000 meq | EXTENDED_RELEASE_TABLET | Freq: Once | ORAL | Status: AC
  Administered 2015-03-10: 40 meq via ORAL
  Filled 2015-03-10: qty 2

## 2015-03-10 MED ORDER — SORBITOL 70 % SOLN
30.0000 mL | Freq: Every day | Status: DC | PRN
  Filled 2015-03-10: qty 30

## 2015-03-10 MED ORDER — POTASSIUM CHLORIDE CRYS ER 20 MEQ PO TBCR
20.0000 meq | EXTENDED_RELEASE_TABLET | Freq: Two times a day (BID) | ORAL | Status: DC
  Administered 2015-03-10 – 2015-03-13 (×6): 20 meq via ORAL
  Filled 2015-03-10 (×6): qty 1

## 2015-03-10 MED ORDER — ONDANSETRON HCL 4 MG/2ML IJ SOLN
4.0000 mg | Freq: Once | INTRAMUSCULAR | Status: AC
  Administered 2015-03-10: 4 mg via INTRAVENOUS
  Filled 2015-03-10: qty 2

## 2015-03-10 MED ORDER — MORPHINE SULFATE (PF) 2 MG/ML IV SOLN: 2.0000 mg | INTRAVENOUS | Status: DC | PRN

## 2015-03-10 MED ORDER — FLUCONAZOLE 100 MG PO TABS
100.0000 mg | ORAL_TABLET | Freq: Every day | ORAL | Status: DC
  Administered 2015-03-10 – 2015-03-13 (×4): 100 mg via ORAL
  Filled 2015-03-10 (×4): qty 1

## 2015-03-10 MED ORDER — IOHEXOL 300 MG/ML  SOLN
50.0000 mL | Freq: Once | INTRAMUSCULAR | Status: AC | PRN
  Administered 2015-03-10: 50 mL via ORAL

## 2015-03-10 MED ORDER — GUAIFENESIN-DM 100-10 MG/5ML PO SYRP
5.0000 mL | ORAL_SOLUTION | ORAL | Status: DC | PRN
  Administered 2015-03-10 – 2015-03-12 (×6): 5 mL via ORAL
  Filled 2015-03-10 (×6): qty 10

## 2015-03-10 MED ORDER — SENNOSIDES-DOCUSATE SODIUM 8.6-50 MG PO TABS: 1.0000 | ORAL_TABLET | Freq: Every evening | ORAL | Status: DC | PRN

## 2015-03-10 MED ORDER — ONDANSETRON HCL 4 MG PO TABS
4.0000 mg | ORAL_TABLET | Freq: Four times a day (QID) | ORAL | Status: DC | PRN
Start: 2015-03-10 — End: 2015-03-13

## 2015-03-10 MED ORDER — SODIUM CHLORIDE 0.9 % IJ SOLN: 3.0000 mL | Freq: Two times a day (BID) | INTRAMUSCULAR | Status: DC

## 2015-03-10 MED ORDER — ACETAMINOPHEN 650 MG RE SUPP: 650.0000 mg | Freq: Four times a day (QID) | RECTAL | Status: DC | PRN

## 2015-03-10 MED ORDER — ENOXAPARIN SODIUM 40 MG/0.4ML ~~LOC~~ SOLN
40.0000 mg | SUBCUTANEOUS | Status: DC
Start: 2015-03-10 — End: 2015-03-13
  Administered 2015-03-10 – 2015-03-12 (×3): 40 mg via SUBCUTANEOUS
  Filled 2015-03-10 (×3): qty 0.4

## 2015-03-10 MED ORDER — PROCHLORPERAZINE MALEATE 10 MG PO TABS: 10.0000 mg | ORAL_TABLET | Freq: Four times a day (QID) | ORAL | Status: DC | PRN

## 2015-03-10 NOTE — ED Notes (Signed)
[facility] is not flushing or drawing blood. IV team notified

## 2015-03-10 NOTE — ED Notes (Signed)
Bed: WA20 Expected date:  Expected time:  Means of arrival:  Comments: EMS 

## 2015-03-10 NOTE — ED Notes (Signed)
Pt walked to and from restroom with assist state she felt fine no light head or weakness.

## 2015-03-10 NOTE — Progress Notes (Addendum)
Call from [facility] when pt/family inquired about how to obtain hospice   CM reviewed in details medicare guidelines, home health [facility]) (length of stay in home, types of [facility] staff available, coverage, primary caregiver, up to 24 hrs before services may be started) Hospice (length of stay in home, types of staff, coverage, visits, oncologist and another provider to discuss prognosis) and Private duty nursing (PDN-coverage, length of stay in the home types of staff available). CM reviewed availability of [place] [facility] to assist pcp to get pt to snf (if desired disposition) from the community level. CM provided pt/family with a list of Home hospice agencies, [place] home health agencies and PDN.  Daughter in law states pt may be interested in [facility] if need hospice facility but has not made choice of home hospice agency Cm discussed sharing this choice with Dr Alen Blew so referral to be completed   [place] and pt chose [facility]  [address] [place], [place] [address] [phone]  [address] spoke with Posey Pronto at [facility] home health to initiate hospital bed  and was informed that [facility] no longer has home health services and to tell the family who "we have worked with a long time that we are sorry" Pat recommended [facility] or [facility] to offer to family that are agencies serving [employer]

## 2015-03-10 NOTE — ED Notes (Signed)
PT'S BLOOD SAMPLE WILL BE COLLECT FROM [facility]

## 2015-03-10 NOTE — ED Notes (Signed)
Dr.Floyd and RN notified of pt's Lactic Acid of 3.12

## 2015-03-10 NOTE — H&P (Signed)
[facility] History and Physical  STAVROULA ROHDE QMG:[id number] DOB: 1950-06-05 DOA: 03/10/2015  Referring physician: Dr Tyrone Nine PCP: Chevis Pretty, FNP   Chief Complaint: Weakness/right upper abdominal pain  HPI: Lindsay Aguirre is a 64 y.o. 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 Patient also noted to have a C7 metastatic lesion August 2016 currently on chemotherapy, history of hypertension, hypothyroidism, hyperlipidemia, diverticulosis presented to the ED with one week history of coughing, abdominal distention and swelling with abdominal pain, dizziness, nausea with an episode of emesis 2 days prior to admission. Patient denies any fevers, no chills, no chest pain, no shortness of breath, no diarrhea, no constipation, no dysuria, no melena, no hematemesis, no hematochezia. Patient denies any dysphagia. Patient was recently seen by PCP and being treated for an otitis media. Patient was seen in the ED basic metabolic profile obtained at a potassium of 2.9 otherwise was within normal limits. Hepatic panel obtained had albumin of 2.4 protein of 5.0 otherwise is within normal limits. Lactic acid level is elevated at 4. CBC had a white count of 15.5 platelet count of 114 otherwise was within normal limits. Urinalysis was nitrite negative leukocytes negative. Chest x-ray was slightly low lung volumes and vascular crowding the hilar regions cannot exclude vascular congestion. Few patchy densities at the lung bases could represent atelectasis and suspected new small left pleural effusion. CT of the abdomen and pelvis done with new small pleural effusions and worsening abdominal ascites. Multiple small splenic infarcts. Extensive hepatic metastatic disease. Patient was given a dose of IV vancomycin and IV Zosyn. Chart  hospice were called to admit the patient for further evaluation and management.   Review of Systems: As per history of present illness otherwise negative. Constitutional:  No weight loss, night sweats, Fevers, chills, fatigue.  HEENT:  No headaches, Difficulty swallowing,Tooth/dental problems,Sore throat,  No sneezing, itching, ear ache, nasal congestion, post nasal drip,  Cardio-vascular:  No chest pain, Orthopnea, PND, swelling in lower extremities, anasarca, dizziness, palpitations  GI:  No heartburn, indigestion, abdominal pain, nausea, vomiting, diarrhea, change in bowel habits, loss of appetite  Resp:  No shortness of breath with exertion or at rest. No excess mucus, no productive cough, No non-productive cough, No coughing up of blood.No change in color of mucus.No wheezing.No chest wall deformity  Skin:  no rash or lesions.  GU:  no dysuria, change in color of urine, no urgency or frequency. No flank pain.  Musculoskeletal:  No joint pain or swelling. No decreased range of motion. No back pain.  Psych:  No change in mood or affect. No depression or anxiety. No memory loss.   Past Medical History  Diagnosis Date  . PONV (postoperative nausea and vomiting)   . Hypertension   . Hypothyroidism   . Arthritis   . Gout   . Neuromuscular disorder ([place])   . Hyperlipidemia   . Family history of anesthesia complication     '" MY OLDEST SON [place] ME "  . 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 Diverticulosis of large intestine without hemorrhage 03/05/2014   Past Surgical History  Procedure Laterality Date  . Cervical fusion  '99 & '03  . Rotator cuff surgery  '08    right  . Cataract extraction w/phaco  12/13/2010    Procedure: CATARACT EXTRACTION PHACO AND INTRAOCULAR LENS PLACEMENT (IOC);  Surgeon: Tonny Branch;  Location: [facility];  Service: Ophthalmology;  Laterality: Right;  CDE: 8.94  . Eye surgery    . Back surgery      2009  . Back surgery  2011    lumbar  surgery  . Tubal ligation  1979  . Rotator cuff repair Left 04/09/2013    DR Marlou Sa  . Shoulder arthroscopy with subacromial decompression and open rotator c Left 04/09/2013    Procedure: SHOULDER ARTHROSCOPY WITH SUBACROMIAL DECOMPRESSION AND MINI OPEN ROTATOR CUFF REPAIR, POSSIBLE BICEP TENDONODESIS,  ;  Surgeon: Meredith Pel, MD;  Location: [place];  Service: Orthopedics;  Laterality: Left;   Social History:  reports that she has never smoked. She has never used smokeless tobacco. She reports that she does not drink alcohol or use illicit drugs.  Allergies  Allergen Reactions  . Livalo [Pitavastatin]     Leg cramps  . Rosuvastatin Calcium     Other reaction(s): Cramps (ALLERGY/intolerance) Leg Cramps   . Simvastatin     Leg cramps  . Statins     Leg Cramps     Family History  Problem Relation Age of Onset  . Anesthesia problems Neg Hx   . Hypotension Neg Hx   . Malignant hyperthermia Neg Hx   . Pseudochol deficiency Neg Hx   . Diabetes Sister   . Stroke Mother   . Heart disease Father   . Stroke Father   . Diabetes Brother   . Diabetes Sister   . Diabetes Sister   . Diabetes Sister    mother deceased age 40 from a stroke. Father deceased age 37 from coronary artery disease.  Prior to Admission medications   Medication Sig Start Date End Date Taking? Authorizing Provider  allopurinol (ZYLOPRIM) 300 MG tablet TAKE 1 TABLET (300 MG TOTAL) BY MOUTH DAILY. 12/16/14  Yes [name] Hassell Done, FNP  amoxicillin (AMOXIL) 500 MG capsule Take 1 capsule (500 mg total) by mouth 3 (three) times daily. 03/07/15  Yes Chipper Herb, MD  bifidobacterium infantis (ALIGN) capsule Take 1 capsule by mouth daily.   Yes Historical Provider, MD  dexamethasone (DECADRON) 0.5 MG/5ML solution Take 5 cc BID. Patient taking differently: Take 0.5 mg by mouth 2 (two) times daily.  02/24/15  Yes Wyatt Portela, MD  lidocaine-prilocaine (EMLA) cream Apply 1 application topically as needed. Apply cream,  on days of treatment, to [facility] site 1-2 hours prior to treatment. 08/27/13  Yes Wyatt Portela, MD  ondansetron (ZOFRAN) 8 MG tablet Take 1 tablet (8 mg total) by mouth every 8 (eight) hours as needed for nausea or vomiting. 02/24/15  Yes Wyatt Portela, MD  polyethylene glycol (MIRALAX / GLYCOLAX) packet Take 17 g by mouth daily as needed for mild constipation.   Yes Historical Provider, MD  prochlorperazine (COMPAZINE) 10 MG tablet Take 1 tablet (10 mg total) by mouth every 6 (six) hours as needed for nausea or vomiting. 07/03/14  Yes Adrena E Johnson, PA-C  SYNTHROID 88 MCG tablet TAKE 1 TABLET (88 MCG TOTAL) BY MOUTH DAILY BEFORE BREAKFAST. 02/26/15  Yes Fransisca Kaufmann Dettinger, MD  temazepam (RESTORIL) 15 MG capsule Take 1 capsule (15 mg total) by mouth at bedtime as needed for sleep. 03/04/15  Yes Wyatt Portela, MD  albuterol (PROVENTIL HFA;VENTOLIN HFA) 108 (90 BASE) MCG/ACT inhaler Inhale 2 puffs into the lungs every 6 (six) hours as needed for wheezing or shortness of breath. Patient not taking: Reported on 03/10/2015 01/14/15   Vonna Kotyk  A Dettinger, MD  fluconazole (DIFLUCAN) 100 MG tablet Take 1 tablet (100 mg total) by mouth daily. Patient not taking: Reported on 03/10/2015 03/04/15   Susanne Borders, NP  morphine (MSIR) 15 MG tablet Take 1 tablet (15 mg total) by mouth every 4 (four) hours as needed for severe pain. 02/24/15   Wyatt Portela, MD  morphine 10 MG/5ML solution Take 2.5-5 mLs (5-10 mg total) by mouth every 4 (four) hours as needed for severe pain. Patient not taking: Reported on 03/10/2015 02/21/15   Venetia Maxon Rama, MD  ondansetron (ZOFRAN) 8 MG tablet Take 1 tablet (8 mg total) by mouth every 8 (eight) hours as needed for nausea or vomiting. Patient not taking: Reported on 03/10/2015 02/24/15   Wyatt Portela, MD  potassium chloride SA (K-DUR,KLOR-CON) 20 MEQ tablet Take 1 tablet (20 mEq total) by mouth 2 (two) times daily. Patient not taking: Reported on 03/10/2015 03/04/15   Wyatt Portela, MD  sucralfate (CARAFATE) 1 G tablet Take 1 tablet (1 g total) by mouth 4 (four) times daily -  with meals and at bedtime. Patient not taking: Reported on 03/10/2015 03/04/15   Susanne Borders, NP   Physical Exam: Filed Vitals:   03/10/15 1107 03/10/15 1210 03/10/15 1441 03/10/15 1655  BP: 146/66 112/89 140/73 140/73  Pulse: 89 90 85 84  Temp: 98.6 F (37 C)   98.5 F (36.9 C)  TempSrc: Oral   Oral  Resp: 20 20 14 16   Height:    5\' 5"  (1.651 m)  Weight:    78.472 kg (173 lb)  SpO2: 95% 95% 94% 95%    Wt Readings from Last 3 Encounters:  03/10/15 78.472 kg (173 lb)  03/07/15 80.831 kg (178 lb 3.2 oz)  02/24/15 74.844 kg (165 lb)    General:  Obese, well-developed well-nourished laying on gurney in no acute cardiopulmonary distress. Occasional coughing. Eyes: PERRLA, EOMI, normal lids, irises & conjunctiva ENT: grossly normal hearing, lips & tongue Neck: no LAD, masses or thyromegaly Cardiovascular: RRR, no m/r/g. No LE edema. Respiratory: CTA bilaterally, no w/r/r. Normal respiratory effort. Abdomen: soft, tender to palpation on the right side. Hepatosplenomegaly. Positive bowel sounds. Skin: no rash or induration seen on limited exam Musculoskeletal: grossly normal tone BUE/BLE Psychiatric: grossly normal mood and affect, speech fluent and appropriate Neurologic: Alert and oriented 3. Cranial nerves II through XII are grossly intact. No focal deficits.           Labs on Admission:  Basic Metabolic Panel:  Recent Labs Lab 03/04/15 1133 03/10/15 1123 03/10/15 1142 03/10/15 1510  NA 140 137 139  --   K 2.9* 2.9* 2.9*  --   CL  --  102 98*  --   CO2 29 27  --   --   GLUCOSE 171* 106* 100*  --   BUN 17.1 12 11   --   CREATININE 0.7 0.51 0.40*  --   CALCIUM 8.9 8.3*  --   --   MG  --   --   --  1.7   Liver Function Tests:  Recent Labs Lab 03/04/15 1133 03/10/15 1123  AST 35* 35  ALT 34 25  ALKPHOS 173* 126  BILITOT 0.77 1.1  PROT 5.8* 5.0*  ALBUMIN  2.5* 2.4*    Recent Labs Lab 03/10/15 1123  LIPASE 25   No results for input(s): AMMONIA in the last 168 hours. CBC:  Recent Labs Lab 03/04/15 1133 03/10/15 1123 03/10/15 1142  WBC 28.0* 15.5*  --   NEUTROABS 25.9* 14.0*  --   HGB 12.7 11.2* 12.6  HCT 39.5 33.8* 37.0  MCV 95.8 95.5  --   PLT 157 114*  --    Cardiac Enzymes: No results for input(s): CKTOTAL, CKMB, CKMBINDEX, TROPONINI in the last 168 hours.  BNP (last 3 results) No results for input(s): BNP in the last 8760 hours.  ProBNP (last 3 results) No results for input(s): PROBNP in the last 8760 hours.  CBG: No results for input(s): GLUCAP in the last 168 hours.  Radiological Exams on Admission: Dg Chest 2 View  03/10/2015  CLINICAL DATA:  Cough, congestion and shortness of breath. History of colon cancer and chemotherapy. EXAM: CHEST  2 VIEW COMPARISON:  01/14/2015 and chest CT 02/17/2015 FINDINGS: There is a right Port-A-Cath. Catheter tip near the superior cavoatrial junction. Again noted is surgical hardware in the cervical and upper thoracic spine. Slightly low lung volumes with patchy basilar densities. There is vascular fullness in the hilar regions. Heart size is normal. Negative for a pneumothorax. Trachea is midline. There is blunting at the left costophrenic angle and the lateral view demonstrates a small pleural effusion. Air-fluid level in the stomach. IMPRESSION: Slightly low lung volumes with vascular crowding in the hilar regions. Cannot exclude vascular congestion. Few patchy densities at the lung bases could represent atelectasis and suspect a new small left pleural effusion. Electronically Signed   By: Markus Daft M.D.   On: 03/10/2015 12:15   Ct Abdomen Pelvis W Contrast  03/10/2015  CLINICAL DATA:  Weakness. History of colon carcinoma, post chemotherapy EXAM: CT ABDOMEN AND PELVIS WITH CONTRAST TECHNIQUE: Multidetector CT imaging of the abdomen and pelvis was performed using the standard protocol  following bolus administration of intravenous contrast. CONTRAST:  104mL OMNIPAQUE IOHEXOL 300 MG/ML SOLN, 152mL OMNIPAQUE IOHEXOL 300 MG/ML SOLN COMPARISON:  02/17/2015 FINDINGS: New small bilateral pleural effusions with dependent atelectasis in the visualized lung bases. Interval decrease in pericardial effusion. Multiple large and confluent low-attenuation metastatic foci in the liver, nearly confluent throughout much of the left lobe and segment 4. A small amount of abdominal and pelvic ascites has increased. New wedge-shaped perfusion defects in the spleen suggesting infarcts. Splenic vein is patent but there are multiple retroperitoneal collateral channels to the left renal vein as before. Unremarkable adrenal glands, kidneys, pancreas, gallbladder. No hydronephrosis. The stomach is non dilated. Small bowel and colon are nondilated. Fecal material distends the rectum. Urinary bladder physiologically distended. Intermediate attenuation left ovarian cystic lesion as before. No adenopathy. No free air. Mild aortoiliac arterial calcifications. Previous instrumented PLIF L3-L5. Poorly marginated sclerotic focus in T7 vertebral body as before. No new bone lesions identified. IMPRESSION: 1. New small pleural effusions and worsening abdominal ascites. 2. Multiple small splenic infarcts, new since prior exam. 3. Extensive hepatic metastatic disease as before. Electronically Signed   By: Lucrezia Europe M.D.   On: 03/10/2015 13:36    EKG: Independently reviewed. Normal sinus rhythm. No ischemic changes noted.  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  Sepsis Patient presenting with ongoing cough, lactic acid elevated at 4, patient noted to have a respiratory rate of 20 and a white count of 15.5. Will admit the patient to telemetry floor. Serial lactic acid levels. Check an influenza PCR. Check a pro calcitonin level. Will panculture with blood cultures. Urine cultures. Check a pro-calcitonin. Patient also noted to have ascites will get a therapeutic and diagnostic paracentesis. Will place empirically on IV vancomycin and IV Rocephin.  #2 hypokalemia Check a magnesium level. Replete.  #3 dehydration IV fluids.  #4 recent otitis media of the right ear Stop amoxicillin. Patient on IV Rocephin.  #5 anemia of chronic disease/neoplastic Follow H&H.  #6 ascites Likely a malignant ascites from metastatic adenocarcinoma of the liver. Patient also complaining of abdominal pain. Patient with ascites. Patient also with a leukocytosis. Will get a diagnostic and therapeutic paracentesis. Follow.  #7 splenic infarcts As noted on CT abdomen and pelvis. Patient with a hypercoagulable state from her metastatic cancer and the likely etiology of patient's splenic infarcts. Conservative/supportive care for now. Discussed with hematology.  #8 metastatic adenocarcinoma of the liver Oncology is been informed of admission. Oncology will follow-up with patient.  #9 hypothyroidism Continue home dose Synthroid.  #10 dysphagia Speech therapy.  #11 HTN Stable.   #12 Prophylaxis Lovenox for DVT prophylaxis     Code Status: Full DVT Prophylaxis:Lovenox Family Communication: updated patient, daughters at bedside. Disposition Plan: Admit to telemetry.  Time spent: 33 mins  [facility] Pager [phone]

## 2015-03-10 NOTE — ED Provider Notes (Signed)
CSN: [id number]     Arrival date & time 03/10/15  1041 History   First MD Initiated Contact with Patient 03/10/15 1101     Chief Complaint  Patient presents with  . Fatigue     (Consider location/radiation/quality/duration/timing/severity/associated sxs/prior Treatment) Patient is a 64 y.o. female presenting with general illness. The history is provided by the patient and a relative.  Illness Severity:  Moderate Onset quality:  Gradual Duration:  1 week Timing:  Constant Progression:  Worsening Chronicity:  Recurrent Associated symptoms: congestion, cough and fatigue   Associated symptoms: no chest pain, no fever, no headaches, no myalgias, no nausea, no rhinorrhea, no shortness of breath, no vomiting and no wheezing     64 yo F with a chief complaint of fatigue. This been going on for the past week but worsening over the past couple days. Having cough congestion subjective chills at home. Patient was seen a couple days ago and diagnosed with otitis media. 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 Currently getting chemotherapy. Patient has been generally weak having trouble getting out of bed. Denies dysuria. Has had some right-sided flank pain associated with this. Sharp shooting along the midaxillary line. Several had pain like this before. Some nausea and decreased oral intake with this.  Past Medical History  Diagnosis Date  . PONV (postoperative nausea and vomiting)   . Hypertension   . Hypothyroidism   . Arthritis   . Gout   . Neuromuscular disorder ([place])   . Hyperlipidemia   . Family history of anesthesia complication     '" MY OLDEST SON [place] ME "  . 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 Diverticulosis of large intestine without hemorrhage 03/05/2014   Past Surgical History  Procedure Laterality Date  . Cervical fusion  '99 & '03  . Rotator cuff surgery  '08    right  . Cataract  extraction w/phaco  12/13/2010    Procedure: CATARACT EXTRACTION PHACO AND INTRAOCULAR LENS PLACEMENT (IOC);  Surgeon: Tonny Branch;  Location: [facility];  Service: Ophthalmology;  Laterality: Right;  CDE: 8.94  . Eye surgery    . Back surgery      2009  . Back surgery  2011    lumbar surgery  . Tubal ligation  1979  . Rotator cuff repair Left 04/09/2013    DR Marlou Sa  . Shoulder arthroscopy with subacromial decompression and open rotator c Left 04/09/2013    Procedure: SHOULDER ARTHROSCOPY WITH SUBACROMIAL DECOMPRESSION AND MINI OPEN ROTATOR CUFF REPAIR, POSSIBLE BICEP TENDONODESIS,  ;  Surgeon: Meredith Pel, MD;  Location: [facility];  Service: Orthopedics;  Laterality: Left;   Family History  Problem Relation Age of Onset  . Anesthesia problems Neg Hx   . Hypotension Neg Hx   . Malignant hyperthermia Neg Hx   . Pseudochol deficiency Neg Hx   . Diabetes Sister   . Stroke Mother   . Heart disease Father   . Stroke Father   . Diabetes Brother   . Diabetes Sister   . Diabetes Sister   . Diabetes Sister    Social History  Substance Use Topics  . Smoking status: Never Smoker   . Smokeless tobacco: Never Used  . Alcohol Use: No   OB History    No data available     Review of Systems  Constitutional: Positive for fatigue. Negative for fever and chills.  HENT: Positive for congestion. Negative for  rhinorrhea.   Eyes: Negative for redness and visual disturbance.  Respiratory: Positive for cough. Negative for shortness of breath and wheezing.   Cardiovascular: Negative for chest pain and palpitations.  Gastrointestinal: Negative for nausea and vomiting.  Genitourinary: Negative for dysuria and urgency.  Musculoskeletal: Negative for myalgias and arthralgias.  Skin: Negative for pallor and wound.  Neurological: Negative for dizziness and headaches.      Allergies  Livalo; Rosuvastatin calcium; Simvastatin; and Statins  Home Medications   Prior to Admission medications    Medication Sig Start Date End Date Taking? Authorizing Provider  allopurinol (ZYLOPRIM) 300 MG tablet TAKE 1 TABLET (300 MG TOTAL) BY MOUTH DAILY. 12/16/14  Yes [name] Hassell Done, FNP  amoxicillin (AMOXIL) 500 MG capsule Take 1 capsule (500 mg total) by mouth 3 (three) times daily. 03/07/15  Yes Chipper Herb, MD  bifidobacterium infantis (ALIGN) capsule Take 1 capsule by mouth daily.   Yes Historical Provider, MD  dexamethasone (DECADRON) 0.5 MG/5ML solution Take 5 cc BID. Patient taking differently: Take 0.5 mg by mouth 2 (two) times daily.  02/24/15  Yes Wyatt Portela, MD  lidocaine-prilocaine (EMLA) cream Apply 1 application topically as needed. Apply cream, on days of treatment, to [facility] site 1-2 hours prior to treatment. 08/27/13  Yes Wyatt Portela, MD  ondansetron (ZOFRAN) 8 MG tablet Take 1 tablet (8 mg total) by mouth every 8 (eight) hours as needed for nausea or vomiting. 02/24/15  Yes Wyatt Portela, MD  polyethylene glycol (MIRALAX / GLYCOLAX) packet Take 17 g by mouth daily as needed for mild constipation.   Yes Historical Provider, MD  prochlorperazine (COMPAZINE) 10 MG tablet Take 1 tablet (10 mg total) by mouth every 6 (six) hours as needed for nausea or vomiting. 07/03/14  Yes Adrena E Johnson, PA-C  SYNTHROID 88 MCG tablet TAKE 1 TABLET (88 MCG TOTAL) BY MOUTH DAILY BEFORE BREAKFAST. 02/26/15  Yes Fransisca Kaufmann Dettinger, MD  temazepam (RESTORIL) 15 MG capsule Take 1 capsule (15 mg total) by mouth at bedtime as needed for sleep. 03/04/15  Yes Wyatt Portela, MD  albuterol (PROVENTIL HFA;VENTOLIN HFA) 108 (90 BASE) MCG/ACT inhaler Inhale 2 puffs into the lungs every 6 (six) hours as needed for wheezing or shortness of breath. Patient not taking: Reported on 03/10/2015 01/14/15   Fransisca Kaufmann Dettinger, MD  fluconazole (DIFLUCAN) 100 MG tablet Take 1 tablet (100 mg total) by mouth daily. Patient not taking: Reported on 03/10/2015 03/04/15   Susanne Borders, NP  morphine (MSIR) 15 MG tablet Take 1  tablet (15 mg total) by mouth every 4 (four) hours as needed for severe pain. 02/24/15   Wyatt Portela, MD  morphine 10 MG/5ML solution Take 2.5-5 mLs (5-10 mg total) by mouth every 4 (four) hours as needed for severe pain. Patient not taking: Reported on 03/10/2015 02/21/15   Venetia Maxon Rama, MD  ondansetron (ZOFRAN) 8 MG tablet Take 1 tablet (8 mg total) by mouth every 8 (eight) hours as needed for nausea or vomiting. Patient not taking: Reported on 03/10/2015 02/24/15   Wyatt Portela, MD  potassium chloride SA (K-DUR,KLOR-CON) 20 MEQ tablet Take 1 tablet (20 mEq total) by mouth 2 (two) times daily. Patient not taking: Reported on 03/10/2015 03/04/15   Wyatt Portela, MD  sucralfate (CARAFATE) 1 G tablet Take 1 tablet (1 g total) by mouth 4 (four) times daily -  with meals and at bedtime. Patient not taking: Reported on 03/10/2015 03/04/15   Caren Griffins  Katharine Look, NP   BP 140/73 mmHg  Pulse 85  Temp(Src) 98.6 F (37 C) (Oral)  Resp 14  SpO2 94% Physical Exam  Constitutional: She is oriented to person, place, and time. She appears well-developed and well-nourished. No distress.  HENT:  Head: Normocephalic and atraumatic.  Eyes: EOM are normal. Pupils are equal, round, and reactive to light.  Neck: Normal range of motion. Neck supple.  Cardiovascular: Normal rate and regular rhythm.  Exam reveals no gallop and no friction rub.   No murmur heard. Pulmonary/Chest: Effort normal. She has no wheezes. She has no rales.  Abdominal: Soft. She exhibits no distension. There is tenderness (diffuse mild). There is no rebound and no guarding.  Musculoskeletal: She exhibits no edema or tenderness.  Neurological: She is alert and oriented to person, place, and time.  Skin: Skin is warm and dry. She is not diaphoretic.  Psychiatric: She has a normal mood and affect. Her behavior is normal.  Nursing note and vitals reviewed.   ED Course  Procedures (including critical care time) Labs Review Labs Reviewed   CBC WITH DIFFERENTIAL/PLATELET - Abnormal; Notable for the following:    WBC 15.5 (*)    RBC 3.54 (*)    Hemoglobin 11.2 (*)    HCT 33.8 (*)    RDW 16.9 (*)    Platelets 114 (*)    Neutro Abs 14.0 (*)    All other components within normal limits  COMPREHENSIVE METABOLIC PANEL - Abnormal; Notable for the following:    Potassium 2.9 (*)    Glucose, Bld 106 (*)    Calcium 8.3 (*)    Total Protein 5.0 (*)    Albumin 2.4 (*)    All other components within normal limits  I-STAT CHEM 8, ED - Abnormal; Notable for the following:    Potassium 2.9 (*)    Chloride 98 (*)    Creatinine, Ser 0.40 (*)    Glucose, Bld 100 (*)    Calcium, Ion 1.12 (*)    All other components within normal limits  I-STAT CG4 LACTIC ACID, ED - Abnormal; Notable for the following:    Lactic Acid, Venous 4.00 (*)    All other components within normal limits  I-STAT CG4 LACTIC ACID, ED - Abnormal; Notable for the following:    Lactic Acid, Venous 3.12 (*)    All other components within normal limits  I-STAT CG4 LACTIC ACID, ED - Abnormal; Notable for the following:    Lactic Acid, Venous 3.74 (*)    All other components within normal limits  CULTURE, BLOOD (ROUTINE X 2)  CULTURE, BLOOD (ROUTINE X 2)  URINE CULTURE  URINALYSIS, ROUTINE W REFLEX MICROSCOPIC (NOT AT [facility])  LIPASE, BLOOD  INFLUENZA PANEL BY PCR (TYPE A & B, H1N1)  MAGNESIUM    Imaging Review Dg Chest 2 View  03/10/2015  CLINICAL DATA:  Cough, congestion and shortness of breath. History of colon cancer and chemotherapy. EXAM: CHEST  2 VIEW COMPARISON:  01/14/2015 and chest CT 02/17/2015 FINDINGS: There is a right Port-A-Cath. Catheter tip near the superior cavoatrial junction. Again noted is surgical hardware in the cervical and upper thoracic spine. Slightly low lung volumes with patchy basilar densities. There is vascular fullness in the hilar regions. Heart size is normal. Negative for a pneumothorax. Trachea is midline. There is blunting at  the left costophrenic angle and the lateral view demonstrates a small pleural effusion. Air-fluid level in the stomach. IMPRESSION: Slightly low lung volumes with vascular  crowding in the hilar regions. Cannot exclude vascular congestion. Few patchy densities at the lung bases could represent atelectasis and suspect a new small left pleural effusion. Electronically Signed   By: Markus Daft M.D.   On: 03/10/2015 12:15   Ct Abdomen Pelvis W Contrast  03/10/2015  CLINICAL DATA:  Weakness. History of colon carcinoma, post chemotherapy EXAM: CT ABDOMEN AND PELVIS WITH CONTRAST TECHNIQUE: Multidetector CT imaging of the abdomen and pelvis was performed using the standard protocol following bolus administration of intravenous contrast. CONTRAST:  74mL OMNIPAQUE IOHEXOL 300 MG/ML SOLN, 160mL OMNIPAQUE IOHEXOL 300 MG/ML SOLN COMPARISON:  02/17/2015 FINDINGS: New small bilateral pleural effusions with dependent atelectasis in the visualized lung bases. Interval decrease in pericardial effusion. Multiple large and confluent low-attenuation metastatic foci in the liver, nearly confluent throughout much of the left lobe and segment 4. A small amount of abdominal and pelvic ascites has increased. New wedge-shaped perfusion defects in the spleen suggesting infarcts. Splenic vein is patent but there are multiple retroperitoneal collateral channels to the left renal vein as before. Unremarkable adrenal glands, kidneys, pancreas, gallbladder. No hydronephrosis. The stomach is non dilated. Small bowel and colon are nondilated. Fecal material distends the rectum. Urinary bladder physiologically distended. Intermediate attenuation left ovarian cystic lesion as before. No adenopathy. No free air. Mild aortoiliac arterial calcifications. Previous instrumented PLIF L3-L5. Poorly marginated sclerotic focus in T7 vertebral body as before. No new bone lesions identified. IMPRESSION: 1. New small pleural effusions and worsening abdominal  ascites. 2. Multiple small splenic infarcts, new since prior exam. 3. Extensive hepatic metastatic disease as before. Electronically Signed   By: Lucrezia Europe M.D.   On: 03/10/2015 13:36   I have personally reviewed and evaluated these images and lab results as part of my medical decision-making.   EKG Interpretation   Date/Time:  Tuesday March 10 2015 11:10:59 EST Ventricular Rate:  88 PR Interval:  135 QRS Duration: 72 QT Interval:  419 QTC Calculation: 507 R Axis:   41 Text Interpretation:  Sinus rhythm Low voltage, extremity leads baseline  wander I, II Otherwise no significant change Confirmed by Kaylise Blakeley MD, DANIEL  ([id number]) on 03/10/2015 11:31:27 AM      MDM   Final diagnoses:  Sepsis, due to unspecified organism [facility])    64 yo F with a chief complaint of fatigue. Patient appears to be mildly dehydrated on exam. Given some fluids with some improvement. Initial lactate 4. Without fever did not initially start antibiotics. Chest x-ray concerning for possible left lower lobe pneumonia. We'll cover with Vanc and Zosyn. After 30 mL/kg of IV fluids patient's repeat lactate was 3.4. CT scan with concern for possible splenic infarct.  With elevated lactate cough and subjective chills suspect infectious etiology. Admit to the hospital for possible sepsis.  The patients results and plan were reviewed and discussed.   Any x-rays performed were independently reviewed by myself.   Differential diagnosis were considered with the presenting HPI.  Medications  vancomycin (VANCOCIN) 1,500 mg in sodium chloride 0.9 % 500 mL IVPB (1,500 mg Intravenous Pending 03/10/15 1509)  potassium chloride 10 mEq in 100 mL IVPB (not administered)  cefTRIAXone (ROCEPHIN) 2 g in dextrose 5 % 50 mL IVPB (not administered)  oseltamivir (TAMIFLU) capsule 75 mg (not administered)  sodium chloride 0.9 % bolus 1,000 mL (0 mLs Intravenous Stopped 03/10/15 1240)  morphine 4 MG/ML injection 4 mg (4 mg Intravenous Given  03/10/15 1135)  ondansetron (ZOFRAN) injection 4 mg (4 mg Intravenous Given  03/10/15 1133)  sodium chloride 0.9 % bolus 1,250 mL (0 mLs Intravenous Stopped 03/10/15 1352)  piperacillin-tazobactam (ZOSYN) IVPB 3.375 g (3.375 g Intravenous New Bag/Given 03/10/15 1337)  iohexol (OMNIPAQUE) 300 MG/ML solution 50 mL (50 mLs Oral Contrast Given 03/10/15 1145)  iohexol (OMNIPAQUE) 300 MG/ML solution 100 mL (100 mLs Intravenous Contrast Given 03/10/15 1306)  potassium chloride SA (K-DUR,KLOR-CON) CR tablet 40 mEq (40 mEq Oral Given 03/10/15 1431)    Filed Vitals:   03/10/15 1107 03/10/15 1210 03/10/15 1441  BP: 146/66 112/89 140/73  Pulse: 89 90 85  Temp: 98.6 F (37 C)    TempSrc: Oral    Resp: 20 20 14   SpO2: 95% 95% 94%    Final diagnoses:  Sepsis, due to unspecified organism [facility])    Admission/ observation were discussed with the admitting physician, patient and/or family and they are comfortable with the plan.      Deno Etienne, DO 03/10/15 1526

## 2015-03-10 NOTE — ED Notes (Signed)
Patient here from home with complaints of generalized weakness. Hx of colon cancer. Last chemo treatment Friday 11/4.

## 2015-03-10 NOTE — Progress Notes (Addendum)
Confirmed pcp remains Ronnald Collum Female family and young female at bedside

## 2015-03-10 NOTE — ED Notes (Addendum)
1635-

## 2015-03-10 NOTE — Progress Notes (Signed)
ANTIBIOTIC CONSULT NOTE - INITIAL  Pharmacy Consult for Vancomycin Indication: Sepsis  Allergies  Allergen Reactions  . Livalo [Pitavastatin]     Leg cramps  . Rosuvastatin Calcium     Other reaction(s): Cramps (ALLERGY/intolerance) Leg Cramps   . Simvastatin     Leg cramps  . Statins     Leg Cramps     Patient Measurements:   Last documented weight ~ 81 kg (03/07/15)  Vital Signs: Temp: 98.6 F (37 C) (11/08 1107) Temp Source: Oral (11/08 1107) BP: 140/73 mmHg (11/08 1441) Pulse Rate: 85 (11/08 1441) Intake/Output from previous day:    Labs:  Recent Labs  03/10/15 1123 03/10/15 1142  WBC 15.5*  --   HGB 11.2* 12.6  PLT 114*  --   CREATININE 0.51 0.40*   Estimated Creatinine Clearance: 75.1 mL/min (by C-G formula based on Cr of 0.4). No results for input(s): VANCOTROUGH, VANCOPEAK, VANCORANDOM, GENTTROUGH, GENTPEAK, GENTRANDOM, TOBRATROUGH, TOBRAPEAK, TOBRARND, AMIKACINPEAK, AMIKACINTROU, AMIKACIN in the last 72 hours.   Microbiology: No results found for this or any previous visit (from the past 720 hour(s)).  Medical History: Past Medical History  Diagnosis Date  . PONV (postoperative nausea and vomiting)   . Hypertension   . Hypothyroidism   . Arthritis   . Gout   . Neuromuscular disorder ([place])   . Hyperlipidemia   . Family history of anesthesia complication     '" MY OLDEST SON [place] "  . 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 Diverticulosis of large intestine without hemorrhage 03/05/2014    Medications:  Anti-infectives    Start     Dose/Rate Route Frequency Ordered Stop   03/10/15 1530  oseltamivir (TAMIFLU) capsule 75 mg     75 mg Oral 2 times daily 03/10/15 1508 03/15/15 0959   03/10/15 1515  cefTRIAXone (ROCEPHIN) 2 g in dextrose 5 % 50 mL IVPB     2 g 100 mL/hr over 30 Minutes Intravenous Every 24 hours 03/10/15 1508     03/10/15 1330  vancomycin (VANCOCIN) 1,500 mg in sodium chloride 0.9 % 500 mL IVPB     1,500 mg 250  mL/hr over 120 Minutes Intravenous  Once 03/10/15 1246     03/10/15 1300  piperacillin-tazobactam (ZOSYN) IVPB 3.375 g     3.375 g 100 mL/hr over 30 Minutes Intravenous  Once 03/10/15 1246 03/10/15 1407     Assessment: 56 yoF [facility] ED [facility] with worsening fatigue, cough, congestion, chills, and recently diagnosed otitis media (started taking Amoxicillin outpatient on 11/5).  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  Chest Xray concerning for possible LLL pneumonia and CT abdomen with possible splenic infarct.  Lactic acid elevated.  First doses of Vancomycin, Zosyn, and Tamiflu given in ED.  Pharmacy is consulted to dose Vancomycin for suspected sepsis.  11/8 >> Zosyn x1 11/8 >> Vancomycin >> 11/8 >> Tamiflu  >> 11/8 >> Ceftriaxone >>  Today, 03/10/2015   Tmax: afebrile  WBC elevated, 15.5 (pt taking dexamethasone PTA)  SCr 0.4 with CrCl ~ 75 ml/min  Lactic acid: 4 > 3.12 > 3.74  Goal of Therapy:  Vancomycin trough level 15-20 mcg/ml  Plan:   Vancomycin 1.5 g IV once, then 1g IV q12h.  Measure Vanc trough at steady state.  Follow up renal fxn, culture results, and clinical course.   Gretta Arab PharmD,  BCPS Pager [phone] 03/10/2015 3:22 PM

## 2015-03-11 ENCOUNTER — Other Ambulatory Visit: Payer: Self-pay | Admitting: Nurse Practitioner

## 2015-03-11 ENCOUNTER — Inpatient Hospital Stay (HOSPITAL_COMMUNITY): Payer: Medicare Other

## 2015-03-11 DIAGNOSIS — C800 Disseminated malignant neoplasm, unspecified: Secondary | ICD-10-CM

## 2015-03-11 DIAGNOSIS — A419 Sepsis, unspecified organism: Principal | ICD-10-CM

## 2015-03-11 DIAGNOSIS — R05 Cough: Secondary | ICD-10-CM

## 2015-03-11 DIAGNOSIS — R42 Dizziness and giddiness: Secondary | ICD-10-CM

## 2015-03-11 DIAGNOSIS — D735 Infarction of spleen: Secondary | ICD-10-CM

## 2015-03-11 DIAGNOSIS — I10 Essential (primary) hypertension: Secondary | ICD-10-CM

## 2015-03-11 DIAGNOSIS — E876 Hypokalemia: Secondary | ICD-10-CM

## 2015-03-11 DIAGNOSIS — C787 Secondary malignant neoplasm of liver and intrahepatic bile duct: Secondary | ICD-10-CM

## 2015-03-11 DIAGNOSIS — E039 Hypothyroidism, unspecified: Secondary | ICD-10-CM

## 2015-03-11 DIAGNOSIS — C221 Intrahepatic bile duct carcinoma: Secondary | ICD-10-CM

## 2015-03-11 DIAGNOSIS — E785 Hyperlipidemia, unspecified: Secondary | ICD-10-CM

## 2015-03-11 DIAGNOSIS — D630 Anemia in neoplastic disease: Secondary | ICD-10-CM

## 2015-03-11 DIAGNOSIS — E860 Dehydration: Secondary | ICD-10-CM

## 2015-03-11 DIAGNOSIS — R180 Malignant ascites: Secondary | ICD-10-CM

## 2015-03-11 LAB — COMPREHENSIVE METABOLIC PANEL
ALBUMIN: 2.4 g/dL — AB (ref 3.5–5.0)
ALK PHOS: 127 U/L — AB (ref 38–126)
ALT: 26 U/L (ref 14–54)
AST: 34 U/L (ref 15–41)
Anion gap: 8 (ref 5–15)
BILIRUBIN TOTAL: 1.1 mg/dL (ref 0.3–1.2)
BUN: 10 mg/dL (ref 6–20)
CALCIUM: 8 mg/dL — AB (ref 8.9–10.3)
CO2: 26 mmol/L (ref 22–32)
Chloride: 105 mmol/L (ref 101–111)
Creatinine, Ser: 0.52 mg/dL (ref 0.44–1.00)
GFR calc Af Amer: >60 mL/min (ref >60)
GFR calc non Af Amer: >60 mL/min (ref >60)
GLUCOSE: 107 mg/dL — AB (ref 65–99)
Potassium: 3.7 mmol/L (ref 3.5–5.1)
Sodium: 139 mmol/L (ref 135–145)
TOTAL PROTEIN: 5.2 g/dL — AB (ref 6.5–8.1)

## 2015-03-11 LAB — CBC
HEMATOCRIT: 32.2 % — AB (ref 36.0–46.0)
Hemoglobin: 10.7 g/dL — ABNORMAL LOW (ref 12.0–15.0)
MCH: 31.7 pg (ref 26.0–34.0)
MCHC: 33.2 g/dL (ref 30.0–36.0)
MCV: 95.3 fL (ref 78.0–100.0)
Platelets: 109 10*3/uL — ABNORMAL LOW (ref 150–400)
RBC: 3.38 MIL/uL — ABNORMAL LOW (ref 3.87–5.11)
RDW: 17.2 % — AB (ref 11.5–15.5)
WBC: 18.9 10*3/uL — ABNORMAL HIGH (ref 4.0–10.5)

## 2015-03-11 LAB — PROTIME-INR
INR: 1.54 — AB (ref 0.00–1.49)
PROTHROMBIN TIME: 18.5 s — AB (ref 11.6–15.2)

## 2015-03-11 LAB — LACTIC ACID, PLASMA: LACTIC ACID, VENOUS: 3.5 mmol/L — AB (ref 0.5–2.0)

## 2015-03-11 LAB — URINE CULTURE

## 2015-03-11 LAB — MAGNESIUM: MAGNESIUM: 2.3 mg/dL (ref 1.7–2.4)

## 2015-03-11 MED ORDER — DEXAMETHASONE SODIUM PHOSPHATE 0.1 % OP SOLN
1.0000 [drp] | OPHTHALMIC | Status: DC
  Administered 2015-03-11 – 2015-03-13 (×9): 1 [drp] via OPHTHALMIC
  Filled 2015-03-11: qty 5

## 2015-03-11 MED ORDER — POLYMYXIN B-TRIMETHOPRIM 10000-0.1 UNIT/ML-% OP SOLN
1.0000 [drp] | OPHTHALMIC | Status: DC
  Administered 2015-03-11 – 2015-03-13 (×8): 1 [drp] via OPHTHALMIC
  Filled 2015-03-11: qty 10

## 2015-03-11 MED ORDER — SODIUM CHLORIDE 0.9 % IJ SOLN
10.0000 mL | INTRAMUSCULAR | Status: DC | PRN
  Administered 2015-03-11 – 2015-03-13 (×3): 10 mL
  Filled 2015-03-11 (×2): qty 40

## 2015-03-11 NOTE — Progress Notes (Signed)
Patient brought to [place] for paracentesis, upon reviewing images trace ascites seen not amendable to safe percutaneous approach. Procedure cancelled.    Tsosie Billing PA-C Interventional Radiology  03/11/15 8:55 AM

## 2015-03-11 NOTE — Progress Notes (Signed)
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 She is currently receiving systemic chemotherapy last treatment was administered last week. She presented with abdominal pain, weakness and inability to sleep. Her emergency department evaluation including a CT scan showed increased abdominal ascites and smaller splenic infarcts. She was started on intravenous antibiotics and aggressive hydration.  Overnight clinically she appears to have improved. She still have some abdominal distention and early satiety. She does not report any headaches or blurry vision. Does not report any chest pain or difficulty breathing. Does not report any cough or hemoptysis.  Objective:  Vital signs in last 24 hours: Temp:  [97.5 F (36.4 C)-98.6 F (37 C)] 97.5 F (36.4 C) (11/09 0502) Pulse Rate:  [84-99] 88 (11/09 0502) Resp:  [14-20] 16 (11/09 0502) BP: (112-157)/(66-89) 139/72 mmHg (11/09 0502) SpO2:  [94 %-100 %] 100 % (11/09 0502) Weight:  [173 lb (78.472 kg)] 173 lb (78.472 kg) (11/08 1655) Weight change:  Last BM Date: 03/08/15  Intake/Output from previous day: 11/08 0701 - 11/09 0700 In: 1637.5 [P.O.:360; I.V.:427.5; IV Piggyback:850] Out: -   Mouth: mucous membranes moist, pharynx normal without lesions Resp: clear to auscultation bilaterally Cardio: regular rate and rhythm, S1, S2 normal, no murmur, click, rub or gallop GI: soft, non-tender; bowel sounds normal; no masses,  no organomegaly. Distended with shifting dullness. Extremities: extremities normal, atraumatic, no cyanosis or edema  Portacath without erythema  Lab Results:  Recent Labs  03/10/15 1123 03/10/15 1142 03/11/15 0453  WBC 15.5*  --  18.9*  HGB 11.2* 12.6 10.7*  HCT 33.8* 37.0 32.2*  PLT 114*  --  109*    BMET  Recent Labs  03/10/15 1123 03/10/15 1142 03/11/15 0453  NA 137 139 139  K 2.9* 2.9* 3.7  CL 102 98* 105  CO2 27  --  26   GLUCOSE 106* 100* 107*  BUN 12 11 10   CREATININE 0.51 0.40* 0.52  CALCIUM 8.3*  --  8.0*    Studies/Results: Dg Chest 2 View  03/10/2015  CLINICAL DATA:  Cough, congestion and shortness of breath. History of colon cancer and chemotherapy. EXAM: CHEST  2 VIEW COMPARISON:  01/14/2015 and chest CT 02/17/2015 FINDINGS: There is a right Port-A-Cath. Catheter tip near the superior cavoatrial junction. Again noted is surgical hardware in the cervical and upper thoracic spine. Slightly low lung volumes with patchy basilar densities. There is vascular fullness in the hilar regions. Heart size is normal. Negative for a pneumothorax. Trachea is midline. There is blunting at the left costophrenic angle and the lateral view demonstrates a small pleural effusion. Air-fluid level in the stomach. IMPRESSION: Slightly low lung volumes with vascular crowding in the hilar regions. Cannot exclude vascular congestion. Few patchy densities at the lung bases could represent atelectasis and suspect a new small left pleural effusion. Electronically Signed   By: Markus Daft M.D.   On: 03/10/2015 12:15   Ct Head Wo Contrast  03/10/2015  CLINICAL DATA:  Generalized weakness and dizziness today. EXAM: CT HEAD WITHOUT CONTRAST TECHNIQUE: Contiguous axial images were obtained from the base of the skull through the vertex without intravenous contrast. COMPARISON:  None. FINDINGS: The ventricles are normal in size and configuration. No extra-axial fluid collections are identified. The gray-white differentiation is normal. No CT findings for acute intracranial process such as hemorrhage or infarction. No mass lesions. The brainstem and cerebellum are grossly normal. The bony structures are intact.  The paranasal sinuses and mastoid air cells are clear. The globes are intact. IMPRESSION: Normal head CT. Electronically Signed   By: Marijo Sanes M.D.   On: 03/10/2015 21:56   Ct Abdomen Pelvis W Contrast  03/10/2015  CLINICAL DATA:   Weakness. History of colon carcinoma, post chemotherapy EXAM: CT ABDOMEN AND PELVIS WITH CONTRAST TECHNIQUE: Multidetector CT imaging of the abdomen and pelvis was performed using the standard protocol following bolus administration of intravenous contrast. CONTRAST:  41mL OMNIPAQUE IOHEXOL 300 MG/ML SOLN, 117mL OMNIPAQUE IOHEXOL 300 MG/ML SOLN COMPARISON:  02/17/2015 FINDINGS: New small bilateral pleural effusions with dependent atelectasis in the visualized lung bases. Interval decrease in pericardial effusion. Multiple large and confluent low-attenuation metastatic foci in the liver, nearly confluent throughout much of the left lobe and segment 4. A small amount of abdominal and pelvic ascites has increased. New wedge-shaped perfusion defects in the spleen suggesting infarcts. Splenic vein is patent but there are multiple retroperitoneal collateral channels to the left renal vein as before. Unremarkable adrenal glands, kidneys, pancreas, gallbladder. No hydronephrosis. The stomach is non dilated. Small bowel and colon are nondilated. Fecal material distends the rectum. Urinary bladder physiologically distended. Intermediate attenuation left ovarian cystic lesion as before. No adenopathy. No free air. Mild aortoiliac arterial calcifications. Previous instrumented PLIF L3-L5. Poorly marginated sclerotic focus in T7 vertebral body as before. No new bone lesions identified. IMPRESSION: 1. New small pleural effusions and worsening abdominal ascites. 2. Multiple small splenic infarcts, new since prior exam. 3. Extensive hepatic metastatic disease as before. Electronically Signed   By: Lucrezia Europe M.D.   On: 03/10/2015 13:36    Medications: I have reviewed the patient's current medications.  Assessment/Plan:  64 year old woman with the following issues:  1. 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 She has been on gemcitabine and have clearly progressed. She  was started on salvage chemotherapy in the form of FOLFOX last week and her next cycle scheduled for next week. This will be resumed once she is fully recovered from this episode.  2. Abdominal ascites: Likely malignant in etiology. I agree with a therapeutic paracentesis.  3. Questionable sepsis: She had an elevated lactic acid and was started on prophylactic antibiotics. Cultures are pending at this time.  4. 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 I would not recommend anticoagulation at this time.  I agree with the current plan of supportive management measures. If no clear sepsis identified and she continues to improve in the next 24-48 hours I have no objections to discharge. She has follow-up already set up at the [facility].   LOS: 1 day   [facility] 03/11/2015, 7:54 AM

## 2015-03-11 NOTE — Evaluation (Signed)
Occupational Therapy Evaluation Patient Details Name: ROSANGELA GARRETTE MRN: [id number] DOB: 13-Feb-1951 Today's Date: 03/11/2015    History of Present Illness 64 yo female admitted with sepsis. Hx of colon cancer with mets to liver and spine, s/p laminectomy 01/2015 at [facility] and XRT to cervical/thoracic spine areas (C5-T10) and chemotherapy, HTN, NM d/o, otitis media. Recent d/c 02/21/15   Clinical Impression   Patient presenting with deconditioning secondary to above. Patient independent to mod I PTA. Patient currently functioning at an overall supervision to min guard assist level without use of RW. Patient will benefit from acute OT to increase overall independence in the areas of ADLs, functional mobility, and overall safety in order to safely discharge home with support and assistance from friends and family.     Follow Up Recommendations  No OT follow up;Supervision/Assistance - 24 hour    Equipment Recommendations  3 in 1 bedside comode    Recommendations for Other Services  None at this time   Precautions / Restrictions Precautions Precautions: Fall Restrictions Weight Bearing Restrictions: No    Mobility Bed Mobility - Per PT note Overal bed mobility: Modified Independent General bed mobility comments: Some reliance on bedrail. Increased time.  Transfers Overall transfer level: Needs assistance Equipment used: None Transfers: Sit to/from Stand Sit to Stand: Min guard General transfer comment: close guard for safety    Balance Overall balance assessment: Needs assistance Sitting-balance support: No upper extremity supported;Feet supported Sitting balance-Leahy Scale: Good     Standing balance support: No upper extremity supported;During functional activity Standing balance-Leahy Scale: Fair    ADL Overall ADL's : Needs assistance/impaired Eating/Feeding: Set up;Sitting   Grooming: Min guard;Standing   Upper Body Bathing: Set up;Sitting   Lower Body  Bathing: Sit to/from stand;Min guard   Upper Body Dressing : Set up;Sitting   Lower Body Dressing: Min guard;Sit to/from stand   Toilet Transfer: Min guard;RW;Grab bars;Comfort height toilet   Toileting- Clothing Manipulation and Hygiene: Supervision/safety;Sitting/lateral lean       Functional mobility during ADLs: Minimal assistance;Cueing for safety General ADL Comments: Pt states she does not use a RW at home. Worked with patient on grooming tasks at sink and ambulated to/from BR for toilet transfer without use of RW. Pt required min guard to min assist with dynamic standing and functional mobility. Encouraged pt to use RW at home for stability, balance, and safety. Pt reports she has good support that can be there with ther 24/7.     Pertinent Vitals/Pain Pain Assessment: No/denies pain     Hand Dominance Right   Extremity/Trunk Assessment Upper Extremity Assessment Upper Extremity Assessment: Overall WFL for tasks assessed   Lower Extremity Assessment Lower Extremity Assessment: Defer to PT evaluation   Cervical / Trunk Assessment Cervical / Trunk Assessment: Normal   Communication Communication Communication: No difficulties   Cognition Arousal/Alertness: Awake/alert Behavior During Therapy: WFL for tasks assessed/performed Overall Cognitive Status: Within Functional Limits for tasks assessed              Home Living Family/patient expects to be discharged to:: Private residence Living Arrangements: Other relatives Available Help at Discharge: Family;Available 24 hours/day Type of Home: House Home Access: Level entry     Home Layout: One level     Bathroom Shower/Tub: Tub/shower unit;Curtain   Firefighter: Standard     Home Equipment: Bedside commode;Walker - 2 wheels (Pt reports BSC is several years old, if she even has it or can find it. Pt requesting new BSC.)  Additional Comments: Pt reports she has good support, someone will be with her  24/7 post acute d/c.       Prior Functioning/Environment Level of Independence: Independent with assistive device(s)  Comments: assistance to walk but not using walker    OT Diagnosis: Generalized weakness   OT Problem List: Decreased strength;Decreased activity tolerance;Impaired balance (sitting and/or standing);Decreased safety awareness;Decreased knowledge of use of DME or AE   OT Treatment/Interventions: Self-care/ADL training;Therapeutic exercise;Energy conservation;DME and/or AE instruction;Balance training;Therapeutic activities;Patient/family education    OT Goals(Current goals can be found in the care plan section) Acute Rehab OT Goals Patient Stated Goal: continue to get stronger OT Goal Formulation: With patient Time For Goal Achievement: 03/25/15 Potential to Achieve Goals: Good ADL Goals Pt Will Perform Grooming: with modified independence;standing Pt Will Transfer to Toilet: with modified independence;ambulating;bedside commode Pt Will Perform Tub/Shower Transfer: Shower transfer;rolling walker;ambulating;3 in 1;with modified independence Additional ADL Goal #1: Pt will be mod I with functional mobility using RW prn  OT Frequency: Min 2X/week   Barriers to D/C: None known at this time   End of Session Equipment Utilized During Treatment: Gait belt  Activity Tolerance: Patient tolerated treatment well Patient left: in chair;with call bell/phone within reach;with chair alarm set   Time: 1610-9604 OT Time Calculation (min): 14 min Charges:  OT General Charges $OT Visit: 1 Procedure OT Evaluation $Initial OT Evaluation Tier I: 1 Procedure  Omarii Scalzo , MS, OTR/L, CLT Pager: [phone]  03/11/2015, 11:44 AM

## 2015-03-11 NOTE — Evaluation (Signed)
Physical Therapy Evaluation Patient Details Name: Lindsay Aguirre MRN: [id number] DOB: 09-Mar-1951 Today's Date: 03/11/2015   History of Present Illness  64 yo female admitted with sepsis. Hx of colon cancer with mets to liver and spine, s/p laminectomy 01/2015 at [facility] and XRT to cervical/thoracic spine areas (C5-T10) and chemotherapy, HTN, NM d/o, otitis media. Recent d/c 02/21/15  Clinical Impression  On eval, pt required Min guard assist for mobility-walked ~100 feet with RW. Pt is generally weaker than baseline. Discussed d/c plan-pt states she plans to return home with family assisting as needed. Pt reports she already has a walker at home.     Follow Up Recommendations Home health PT;Supervision/Assistance - 24 hour    Equipment Recommendations  None recommended by PT    Recommendations for Other Services       Precautions / Restrictions Precautions Precautions: Fall Restrictions Weight Bearing Restrictions: No      Mobility  Bed Mobility Overal bed mobility: Modified Independent             General bed mobility comments: Some reliance on bedrail. Increased time.  Transfers Overall transfer level: Needs assistance Equipment used: Rolling walker (2 wheeled) Transfers: Sit to/from Stand Sit to Stand: Min guard         General transfer comment: close guard for safety. VCs hand placement  Ambulation/Gait Ambulation/Gait assistance: Min guard Ambulation Distance (Feet): 100 Feet Assistive device: Rolling walker (2 wheeled) Gait Pattern/deviations: Step-through pattern;Decreased stride length     General Gait Details: close guard for safety.   Stairs            Wheelchair Mobility    Modified Rankin (Stroke Patients Only)       Balance Overall balance assessment: Needs assistance         Standing balance support: During functional activity Standing balance-Leahy Scale: Fair                               Pertinent  Vitals/Pain Pain Assessment: No/denies pain    Home Living Family/patient expects to be discharged to:: Private residence Living Arrangements: Other relatives Available Help at Discharge: Family Type of Home: House Home Access: Level entry     Home Layout: One level Home Equipment: Bedside commode;Walker - 2 wheels      Prior Function Level of Independence: Independent with assistive device(s)         Comments: assistance to walk but not using walker     Hand Dominance        Extremity/Trunk Assessment   Upper Extremity Assessment: Defer to OT evaluation           Lower Extremity Assessment: Generalized weakness      Cervical / Trunk Assessment: Normal  Communication   Communication: No difficulties  Cognition Arousal/Alertness: Awake/alert Behavior During Therapy: WFL for tasks assessed/performed Overall Cognitive Status: Within Functional Limits for tasks assessed                      General Comments      Exercises        Assessment/Plan    PT Assessment Patient needs continued PT services  PT Diagnosis Difficulty walking;Generalized weakness   PT Problem List Decreased strength;Decreased activity tolerance;Decreased balance;Decreased mobility;Decreased knowledge of use of DME  PT Treatment Interventions DME instruction;Gait training;Functional mobility training;Therapeutic activities;Patient/family education;Balance training;Therapeutic exercise   PT Goals (Current goals can be found in the Care  Plan section) Acute Rehab PT Goals Patient Stated Goal: home. get stronger PT Goal Formulation: With patient Time For Goal Achievement: 03/25/15 Potential to Achieve Goals: Fair    Frequency Min 3X/week   Barriers to discharge        Co-evaluation               End of Session Equipment Utilized During Treatment: Gait belt Activity Tolerance: Patient tolerated treatment well Patient left: in chair;with call bell/phone within  reach;with chair alarm set           Time: 8421-0312 PT Time Calculation (min) (ACUTE ONLY): 12 min   Charges:   PT Evaluation $Initial PT Evaluation Tier I: 1 Procedure     PT G Codes:        Weston Anna, MPT Pager: [phone]

## 2015-03-11 NOTE — Care Management Note (Signed)
Case Management Note  Patient Details  Name: Lindsay Aguirre MRN: [id number] Date of Birth: 1950-08-09  Subjective/Objective:  64 y/o f admitted w/Sepsis. From home. Readmit 10/20-10/22-dysphagia.PT cons.                  Action/Plan:d/c plan home.   Expected Discharge Date:   (UNKNOWN)               Expected Discharge Plan:  Home/Self Care  In-House Referral:     Discharge planning Services  CM Consult  Post Acute Care Choice:    Choice offered to:     DME Arranged:    DME Agency:     HH Arranged:    HH Agency:     Status of Service:  In process, will continue to follow  Medicare Important Message Given:    Date Medicare IM Given:    Medicare IM give by:    Date Additional Medicare IM Given:    Additional Medicare Important Message give by:     If discussed at [place] of Stay Meetings, dates discussed:    Additional Comments:  Dessa Phi, RN 03/11/2015, 2:36 PM

## 2015-03-11 NOTE — Evaluation (Signed)
Clinical/Bedside Swallow Evaluation Patient Details  Name: Lindsay Aguirre MRN: [id number] Date of Birth: [birth year]  Today's Date: 03/11/2015 Time: SLP Start Time (ACUTE ONLY): 1216 SLP Stop Time (ACUTE ONLY): 1229 SLP Time Calculation (min) (ACUTE ONLY): 13 min  Past Medical History:  Past Medical History  Diagnosis Date  . PONV (postoperative nausea and vomiting)   . Hypertension   . Hypothyroidism   . Arthritis   . Gout   . Neuromuscular disorder (HCC)   . Hyperlipidemia   . Family history of anesthesia complication     '" MY OLDEST SON GETS REAL SICK LIKE ME "  . 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 Diverticulosis of large intestine without hemorrhage 03/05/2014   Past Surgical History:  Past Surgical History  Procedure Laterality Date  . Cervical fusion  '99 & '03  . Rotator cuff surgery  '08    right  . Cataract extraction w/phaco  12/13/2010    Procedure: CATARACT EXTRACTION PHACO AND INTRAOCULAR LENS PLACEMENT (IOC);  Surgeon: Gemma Payor;  Location: [facility];  Service: Ophthalmology;  Laterality: Right;  CDE: 8.94  . Eye surgery    . Back surgery      2009  . Back surgery  2011    lumbar surgery  . Tubal ligation  1979  . Rotator cuff repair Left 04/09/2013    DR August Saucer  . Shoulder arthroscopy with subacromial decompression and open rotator c Left 04/09/2013    Procedure: SHOULDER ARTHROSCOPY WITH SUBACROMIAL DECOMPRESSION AND MINI OPEN ROTATOR CUFF REPAIR, POSSIBLE BICEP TENDONODESIS,  ;  Surgeon: Cammy Copa, MD;  Location: [facility] OR;  Service: Orthopedics;  Laterality: Left;   HPI:  64 yo female admitted with sepsis. Hx of colon cancer with mets to liver and spine, s/p laminectomy 01/2015 at [facility] and XRT to cervical/thoracic spine areas (C5-T10) and chemotherapy, HTN, NM d/o, otitis media, dehydration secondary to dysphagia in the setting of cervical spine metastasis.  Recent d/c 02/21/15 CT 10/22 Pharynx and larynx: No asymmetric mass or inflammatory  enhancement. Edema pharynx and larynx.  Assessment / Plan / Recommendation Clinical Impression  Pt presents with ongoing complaints of mild dysphagia, related to greater effort and deliberation required to pass materials through throat.  She states swallow function has improved since taking steroids; last admission CT showed edema of pharynx/larynx which may have contributed to her symptoms.  Swallow evaluation completed today during lunch meal, and revealed adequate mastication, consistent/palpable swallow response, and no s/s of aspiration.  Pt feeding herself and reports no concerns - swallowing is effortful but functional.  Recommend continuing soft diet, thin liquids, meds with puree, no SLP f/u.    Aspiration Risk  Mild    Diet Recommendation Dysphagia 3 (Mech soft);Thin   Medication Administration: Whole meds with puree    Other  Recommendations Oral Care Recommendations: Oral care QID       Swallow Study Prior Functional Status  Type of Home: House Available Help at Discharge: Family;Available 24 hours/day    General Other Pertinent Information: 64 yo female admitted with sepsis. Hx of colon cancer with mets to liver and spine, s/p laminectomy 01/2015 at [facility] and XRT to cervical/thoracic spine areas (C5-T10) and chemotherapy, HTN, NM d/o, otitis media, dehydration secondary to dysphagia in the setting of cervical spine metastasis.  Recent d/c 02/21/15 CT 10/22 Pharynx and larynx: No asymmetric mass or inflammatory enhancement. Type of Study: Bedside swallow evaluation Previous Swallow Assessment: no Diet Prior to this Study: Thin liquids;Dysphagia 3 (soft) Temperature  Spikes Noted: No Respiratory Status: Room air History of Recent Intubation: No Behavior/Cognition: Alert;Cooperative;Pleasant mood Oral Cavity - Dentition: Adequate natural dentition/normal for age Self-Feeding Abilities: Able to feed self Patient Positioning: Upright in chair/Tumbleform Baseline Vocal  Quality: Normal Volitional Cough: Strong Volitional Swallow: Able to elicit    Oral/Motor/Sensory Function Overall Oral Motor/Sensory Function: Appears within functional limits for tasks assessed   Ice Chips Ice chips: Within functional limits Presentation: Spoon   Thin Liquid Thin Liquid: Within functional limits Presentation: Cup;Self Fed    Nectar Thick Nectar Thick Liquid: Not tested   Honey Thick Honey Thick Liquid: Not tested   Puree Puree: Within functional limits Presentation: Self Fed;Spoon   Solid  Tahra Hitzeman L. [place], [place] CCC/SLP Pager [phone]     Solid: Within functional limits Presentation: Self Fed       Blenda Mounts Laurice 03/11/2015,12:31 PM

## 2015-03-11 NOTE — Progress Notes (Signed)
[facility] PROGRESS NOTE  Chandy Tarman Gangwer WIO:[id number] DOB: [birth year] DOA: 03/10/2015 PCP: Chevis Pretty, FNP  Assessment/Plan:  Sepsis -Presented to ED om 11/8 with respiratory rate of 20,  white count of 15.5 and elevated lactic acid of 4. -serial lactic acid levels, 3.5 today. -Influenza PCR-negative -Pro calcitonin level 1.31 -Blood cultures pending -Urine cultures and CXR are negative. -Daily CBC, BMP , WBC 18.9 today -continue on vancomycin and rocephin, IV hydration.  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 -followed by oncology, resume chemotherapy upon discharge, has appointment set up at the Cancer center  Abdominal ascites -US abdomen showed multiple hepatic masses. Small amount of ascites present in the left upper quadrant. Amount insufficient for successful paracentesis.  Hypokalemia -resolved, potassium today 3.7.  Anemia of chronic disease -fecal occult blood negative -follow H and H  Recent otitis media of the right ear -Stop amoxicillin. Patient on IV Rocephin.  Dysphagia -Appreciate speech and language evaluation -Dysphagia 3 diet  Hypertension -Stable.   Hypothyroidism -continue on home Synthroid.  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 Patient also noted to have C7 metastatic lesion (8/16), she is currently on systemic chemotherapy. She also has a history of hypertension, hypothyroidism, hyperlipidemia and diverticulosis.   She presented to the ED on 11/8 with complaint of cough, abdominal distention, and swelling with abdominal pain, dizziness, nausea, vomiting x 1 week. She denied fever, chills, shortness of breath, diarrhea, constipation, dysuria, melena, hematemesis, and hematochezia. She had recently been seen by PCP for otitis media, on amoxicillin. Patient had a respiratory rate of 20 , WBC of 15.5 and elevated lactic acid of 4. CT abdomen showed worsening abdominal ascites and small splenic infarcts. US abdomen confirmed extensive metastatic disease, with ascites insufficient for paracentesis. Patient improved over night with IV Rocephin and IV vancomycin.  Objective: Filed Vitals:   03/11/15 0502  BP: 139/72  Pulse: 88  Temp: 97.5 F (36.4 C)  Resp: 16    Intake/Output Summary (Last 24 hours) at 03/11/15 1250 Last data filed at 03/11/15 4268  Gross per 24 hour  Intake 1997.5 ml  Output      0 ml  Net 1997.5 ml   Filed Weights   03/10/15 1655  Weight: 78.472 kg (173 lb)    Exam:  General: Obese, well-developed well-nourished, in no acute distress. Occasional coughing.  Eyes: Injected conjunctiva bilaterally. Negative for exudate. PERRLA, EOMI, normal lids, irises.  ENT: grossly normal hearing, lips & tongue  Neck: no LAD, masses or thyromegaly  Cardiovascular: RRR, no m/r/g. No LE edema.  Respiratory: CTA bilaterally, no w/r/r. Normal respiratory effort.  Abdomen: soft, tender to palpation on the right side. Hepatosplenomegaly. Positive bowel sounds.  Skin: no rash or induration seen on limited exam  Musculoskeletal: grossly normal tone BUE/BLE  Psychiatric: grossly normal mood and  affect, speech fluent and appropriate  Neurologic: Alert  and oriented 3. Cranial nerves II through XII are grossly intact. No focal deficits.     Data Reviewed: Basic Metabolic Panel:  Recent Labs Lab 03/10/15 1123 03/10/15 1142 03/10/15 1510 03/10/15 1745 03/11/15 0453  NA 137 139  --   --  139  K 2.9* 2.9*  --   --  3.7  CL 102 98*  --   --  105  CO2 27  --   --   --  26  GLUCOSE 106* 100*  --   --  107*  BUN 12 11  --   --  10  CREATININE 0.51 0.40*  --   --  0.52  CALCIUM 8.3*  --   --   --  8.0*  MG  --   --  1.7 2.2 2.3   Liver Function Tests:  Recent Labs Lab 03/10/15 1123 03/11/15 0453  AST 35 34  ALT 25 26  ALKPHOS 126 127*  BILITOT 1.1 1.1  PROT 5.0* 5.2*  ALBUMIN 2.4* 2.4*    Recent Labs Lab 03/10/15 1123  LIPASE 25   No results for input(s): AMMONIA in the last 168 hours. CBC:  Recent Labs Lab 03/10/15 1123 03/10/15 1142 03/11/15 0453  WBC 15.5*  --  18.9*  NEUTROABS 14.0*  --   --   HGB 11.2* 12.6 10.7*  HCT 33.8* 37.0 32.2*  MCV 95.5  --  95.3  PLT 114*  --  109*   Cardiac Enzymes: No results for input(s): CKTOTAL, CKMB, CKMBINDEX, TROPONINI in the last 168 hours. BNP (last 3 results) No results for input(s): BNP in the last 8760 hours.  ProBNP (last 3 results) No results for input(s): PROBNP in the last 8760 hours.  CBG: No results for input(s): GLUCAP in the last 168 hours.  Recent Results (from the past 240 hour(s))  Culture, Urine     Status: None (Preliminary result)   Collection Time: 03/10/15 12:08 PM  Result Value Ref Range Status   Specimen Description URINE, CLEAN CATCH  Final   Special Requests NONE  Final   Culture   Final    NO GROWTH < 24 HOURS Performed at [facility]    Report Status PENDING  Incomplete  Blood culture (routine x 2)     Status: None (Preliminary result)   Collection Time: 03/10/15  2:14 PM  Result Value Ref Range Status   Specimen Description BLOOD PORTA CATH  Final   Special  Requests BOTTLES DRAWN AEROBIC AND ANAEROBIC 5ML  Final   Culture   Final    NO GROWTH < 24 HOURS Performed at [facility]    Report Status PENDING  Incomplete  Blood culture (routine x 2)     Status: None (Preliminary result)   Collection Time: 03/10/15  3:11 PM  Result Value Ref Range Status   Specimen Description BLOOD RIGHT HAND  Final   Special Requests IN PEDIATRIC BOTTLE 3 ML  Final   Culture   Final    NO GROWTH < 24 HOURS Performed at [facility]    Report Status PENDING  Incomplete     Studies: Dg Chest 2 View  03/10/2015  CLINICAL DATA:  Cough, congestion and shortness of breath. History of colon cancer and chemotherapy. EXAM: CHEST  2 VIEW COMPARISON:  01/14/2015 and chest CT 02/17/2015 FINDINGS: There is a right Port-A-Cath. Catheter tip near the superior cavoatrial junction. Again noted is surgical hardware in the cervical and upper thoracic spine.  Slightly low lung volumes with patchy basilar densities. There is vascular fullness in the hilar regions. Heart size is normal. Negative for a pneumothorax. Trachea is midline. There is blunting at the left costophrenic angle and the lateral view demonstrates a small pleural effusion. Air-fluid level in the stomach. IMPRESSION: Slightly low lung volumes with vascular crowding in the hilar regions. Cannot exclude vascular congestion. Few patchy densities at the lung bases could represent atelectasis and suspect a new small left pleural effusion. Electronically Signed   By: Markus Daft M.D.   On: 03/10/2015 12:15   Ct Head Wo Contrast  03/10/2015  CLINICAL DATA:  Generalized weakness and dizziness today. EXAM: CT HEAD WITHOUT CONTRAST TECHNIQUE: Contiguous axial images were obtained from the base of the skull through the vertex without intravenous contrast. COMPARISON:  None. FINDINGS: The ventricles are normal in size and configuration. No extra-axial fluid collections are identified. The gray-white differentiation is  normal. No CT findings for acute intracranial process such as hemorrhage or infarction. No mass lesions. The brainstem and cerebellum are grossly normal. The bony structures are intact. The paranasal sinuses and mastoid air cells are clear. The globes are intact. IMPRESSION: Normal head CT. Electronically Signed   By: Marijo Sanes M.D.   On: 03/10/2015 21:56   Ct Abdomen Pelvis W Contrast  03/10/2015  CLINICAL DATA:  Weakness. History of colon carcinoma, post chemotherapy EXAM: CT ABDOMEN AND PELVIS WITH CONTRAST TECHNIQUE: Multidetector CT imaging of the abdomen and pelvis was performed using the standard protocol following bolus administration of intravenous contrast. CONTRAST:  25mL OMNIPAQUE IOHEXOL 300 MG/ML SOLN, 1101mL OMNIPAQUE IOHEXOL 300 MG/ML SOLN COMPARISON:  02/17/2015 FINDINGS: New small bilateral pleural effusions with dependent atelectasis in the visualized lung bases. Interval decrease in pericardial effusion. Multiple large and confluent low-attenuation metastatic foci in the liver, nearly confluent throughout much of the left lobe and segment 4. A small amount of abdominal and pelvic ascites has increased. New wedge-shaped perfusion defects in the spleen suggesting infarcts. Splenic vein is patent but there are multiple retroperitoneal collateral channels to the left renal vein as before. Unremarkable adrenal glands, kidneys, pancreas, gallbladder. No hydronephrosis. The stomach is non dilated. Small bowel and colon are nondilated. Fecal material distends the rectum. Urinary bladder physiologically distended. Intermediate attenuation left ovarian cystic lesion as before. No adenopathy. No free air. Mild aortoiliac arterial calcifications. Previous instrumented PLIF L3-L5. Poorly marginated sclerotic focus in T7 vertebral body as before. No new bone lesions identified. IMPRESSION: 1. New small pleural effusions and worsening abdominal ascites. 2. Multiple small splenic infarcts, new since prior  exam. 3. Extensive hepatic metastatic disease as before. Electronically Signed   By: Lucrezia Europe M.D.   On: 03/10/2015 13:36   US Abdomen Limited  03/11/2015  CLINICAL DATA:  Suspected ascites, metastatic cholangiocarcinoma EXAM: LIMITED ABDOMEN ULTRASOUND FOR ASCITES TECHNIQUE: Limited ultrasound survey for ascites was performed in all four abdominal quadrants. COMPARISON:  Abdominal pelvic CT scan of March 10, 2015 FINDINGS: Multiple hepatic masses are observed. A small amount of ascites is present in the left upper quadrant. There is minimal ascites adjacent to the liver. There is a small right pleural effusion. IMPRESSION: There is ascites present but the volume is insufficient for successful paracentesis. Multiple hepatic masses are observed. Electronically Signed   By: David  Martinique M.D.   On: 03/11/2015 09:02    Scheduled Meds: . allopurinol  300 mg Oral Daily  . bifidobacterium infantis  1 capsule Oral Daily  . cefTRIAXone (ROCEPHIN)  IV  2 g Intravenous Q24H  . dexamethasone  0.5 mg Oral BID  . docusate sodium  100 mg Oral BID  . enoxaparin (LOVENOX) injection  40 mg Subcutaneous Q24H  . fluconazole  100 mg Oral Daily  . levothyroxine  88 mcg Oral QAC breakfast  . oseltamivir  75 mg Oral BID  . potassium chloride SA  20 mEq Oral BID  . sodium chloride  3 mL Intravenous Q12H  . sucralfate  1 g Oral TID WC & HS  . vancomycin  1,000 mg Intravenous Q12H   Continuous Infusions: . sodium chloride 75 mL/hr at 03/10/15 1718  . 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  [facility] If 7PM-7AM, please contact night-coverage at [email or web address], password [facility] 03/11/2015, 12:50 PM  LOS: 1 day   Birdie Hopes Pager: [phone] 03/11/2015, 4:11 PM

## 2015-03-12 DIAGNOSIS — R188 Other ascites: Secondary | ICD-10-CM

## 2015-03-12 DIAGNOSIS — H65111 Acute and subacute allergic otitis media (mucoid) (sanguinous) (serous), right ear: Secondary | ICD-10-CM

## 2015-03-12 DIAGNOSIS — C7951 Secondary malignant neoplasm of bone: Secondary | ICD-10-CM

## 2015-03-12 DIAGNOSIS — R1310 Dysphagia, unspecified: Secondary | ICD-10-CM

## 2015-03-12 LAB — CBC
HEMATOCRIT: 32.6 % — AB (ref 36.0–46.0)
HEMOGLOBIN: 10.8 g/dL — AB (ref 12.0–15.0)
MCH: 31.7 pg (ref 26.0–34.0)
MCHC: 33.1 g/dL (ref 30.0–36.0)
MCV: 95.6 fL (ref 78.0–100.0)
Platelets: 126 10*3/uL — ABNORMAL LOW (ref 150–400)
RBC: 3.41 MIL/uL — ABNORMAL LOW (ref 3.87–5.11)
RDW: 17.5 % — ABNORMAL HIGH (ref 11.5–15.5)
WBC: 16.7 10*3/uL — ABNORMAL HIGH (ref 4.0–10.5)

## 2015-03-12 LAB — COMPREHENSIVE METABOLIC PANEL
ALBUMIN: 2.4 g/dL — AB (ref 3.5–5.0)
ALK PHOS: 144 U/L — AB (ref 38–126)
ALT: 28 U/L (ref 14–54)
ANION GAP: 5 (ref 5–15)
AST: 36 U/L (ref 15–41)
BUN: 11 mg/dL (ref 6–20)
CALCIUM: 7.8 mg/dL — AB (ref 8.9–10.3)
CHLORIDE: 105 mmol/L (ref 101–111)
CO2: 26 mmol/L (ref 22–32)
Creatinine, Ser: 0.58 mg/dL (ref 0.44–1.00)
GFR calc Af Amer: >60 mL/min (ref >60)
GFR calc non Af Amer: >60 mL/min (ref >60)
GLUCOSE: 115 mg/dL — AB (ref 65–99)
Potassium: 3.9 mmol/L (ref 3.5–5.1)
SODIUM: 136 mmol/L (ref 135–145)
Total Bilirubin: 0.6 mg/dL (ref 0.3–1.2)
Total Protein: 5.4 g/dL — ABNORMAL LOW (ref 6.5–8.1)

## 2015-03-12 MED ORDER — NYSTATIN 100000 UNIT/GM EX CREA
TOPICAL_CREAM | Freq: Three times a day (TID) | CUTANEOUS | Status: DC
  Administered 2015-03-12 – 2015-03-13 (×3): via TOPICAL
  Filled 2015-03-12 (×3): qty 15

## 2015-03-12 NOTE — Care Management Note (Deleted)
Case Management Note  Patient Details  Name: SAGRARIO GENSHEIMER MRN: [id number] Date of Birth: Jan 15, 1951  Subjective/Objective:PT-recc [facility] HHRN safety eval. [facility] chosen for [facility] rep Tiffany aware of referral. Await HHRN, [id number] order.  Also provided patient w/private duty care list as resource(out of pocket expense). Patient voiced understanding.                    Action/Plan:d/c home [place].   Expected Discharge Date:   (UNKNOWN)               Expected Discharge Plan:  [place]  In-House Referral:     Discharge planning Services  CM Consult  Post Acute Care Choice:    Choice offered to:  Patient  DME Arranged:    DME Agency:     HH Arranged:  RN [place] Agency:  [place]  Status of Service:  In process, will continue to follow  Medicare Important Message Given:    Date Medicare IM Given:    Medicare IM give by:    Date Additional Medicare IM Given:    Additional Medicare Important Message give by:     If discussed at [place] of Stay Meetings, dates discussed:    Additional Comments:  Dessa Phi, RN 03/12/2015, 4:10 PM

## 2015-03-12 NOTE — Care Management Note (Signed)
Case Management Note  Patient Details  Name: Lindsay Aguirre MRN: [id number] Date of Birth: [birth year]  Subjective/Objective:  [facility] can accept patient w/a start of care on Monday-patient agrees. Await HHPT order.                  Action/Plan:   Expected Discharge Date:   (UNKNOWN)               Expected Discharge Plan:  [place]  In-House Referral:     Discharge planning Services  CM Consult  Post Acute Care Choice:    Choice offered to:  Patient  DME Arranged:    DME Agency:     HH Arranged:  PT HH Agency:  [place]  Status of Service:  In process, will continue to follow  Medicare Important Message Given:    Date Medicare IM Given:    Medicare IM give by:    Date Additional Medicare IM Given:    Additional Medicare Important Message give by:     If discussed at [place] of Stay Meetings, dates discussed:    Additional Comments:  Dessa Phi, RN 03/12/2015, 4:18 PM

## 2015-03-12 NOTE — Progress Notes (Signed)
[facility] PROGRESS NOTE  Lindsay Aguirre DOB: 01-Jun-1950 DOA: 03/10/2015 PCP: Chevis Pretty, FNP  Assessment/Plan: Sepsis -Presented to ED om 11/8 with respiratory rate of 20, white count of 15.5 and elevated lactic acid of 4. -serial lactic acid levels, 3.5 yesterday -Influenza PCR-negative -Pro calcitonin level 1.31 -Blood cultures pending -Urine cultures and CXR are negative. -Daily CBC, BMP , WBC trending down -vitals stable -continue on vancomycin and rocephin, IV hydration.  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 -followed by oncology, resume chemotherapy upon discharge, has appointment set up at the Cancer center 03/18/15  Abdominal ascites -US abdomen showed multiple hepatic masses. Small amount of ascites present in the left upper quadrant. Amount insufficient for successful paracentesis.  Hypokalemia -resolved, potassium today 3.9.  Anemia of chronic disease -fecal occult blood negative -follow H and H. Stable at this time.  Recent otitis media of the right ear -Stop amoxicillin. Patient on IV Rocephin.  Dysphagia -Appreciate speech and language evaluation -Dysphagia 3 diet  Hypertension -Stable.   Hypothyroidism -continue on home Synthroid.  Splenic infarcts -Noted on CT abdomen and pelvis -Likely secondary to hypercoagulable state from her metastatic cancer -supportive care -Medical oncology recommended no anticoagulation at this time  Acute Conjunctivitis -bilateral with pain -likely secondary to FOLFOX chemotherapy -start on Polytrim and dexamethazone drops -Improved today  Rash -umbilical region, with discharge and itching several days ago -start nystatin cream, reviewed with radiology, no deep infection, abscess or evidence of mets.  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 Patient also noted to have C7 metastatic lesion (8/16), she is currently on systemic chemotherapy. She also has a history of hypertension, hypothyroidism, hyperlipidemia and diverticulosis.   She presented to the ED on 11/8 with complaint of cough, abdominal distention, and swelling with abdominal pain, dizziness, nausea, vomiting x 1 week. She denied fever, chills, shortness of breath, diarrhea, constipation, dysuria, melena, hematemesis, and hematochezia. She had recently been seen by PCP for otitis media, on amoxicillin. Patient had a respiratory rate of 20 , WBC of 15.5 and elevated lactic acid of 4. CT abdomen showed worsening abdominal ascites and small splenic infarcts. US abdomen confirmed extensive metastatic disease, with ascites insufficient for paracentesis. Patient improved over night with IV Rocephin and IV vancomycin. She complains of rash and redness in the umbilicus. Denies nausea, vomiting, chest pain, shortness of breath, and dizziness at this time.  Objective: Filed Vitals:   03/12/15 0423  BP: 141/81  Pulse: 71  Temp: 97.9 F (36.6 C)  Resp: 18    Intake/Output Summary (Last 24 hours) at 03/12/15 0924 Last data filed at 03/12/15 0849  Gross per 24 hour  Intake   2740 ml  Output   2000 ml  Net    740 ml   Filed Weights   03/10/15 1655 03/12/15 0423  Weight: 78.472 kg (173 lb) 83.3 kg (183 lb 10.3 oz)    Exam:  General: Obese, well-developed well-nourished, in no acute distress. Occasional coughing.  Eyes: Injected conjunctiva bilaterally. Negative for exudate. PERRLA, EOMI, normal lids, irises.  ENT: grossly normal hearing, lips  & tongue  Neck: no LAD, masses or  thyromegaly  Cardiovascular: RRR, no m/r/g. No LE edema.  Respiratory: CTA bilaterally, no w/r/r. Normal respiratory effort.  Abdomen: soft, tender to palpation on the right side. Hepatosplenomegaly. Positive bowel sounds.  Skin: Erythema with no discharge of the umbilicus , no induration   Musculoskeletal: grossly normal tone BUE/BLE  Psychiatric: grossly normal mood and affect, speech fluent and appropriate  Neurologic: Alert and oriented 3. Cranial nerves II through XII are grossly intact. No focal deficits.   Data Reviewed: Basic Metabolic Panel:  Recent Labs Lab 03/10/15 1123 03/10/15 1142 03/10/15 1510 03/10/15 1745 03/11/15 0453 03/12/15 0350  NA 137 139  --   --  139 136  K 2.9* 2.9*  --   --  3.7 3.9  CL 102 98*  --   --  105 105  CO2 27  --   --   --  26 26  GLUCOSE 106* 100*  --   --  107* 115*  BUN 12 11  --   --  10 11  CREATININE 0.51 0.40*  --   --  0.52 0.58  CALCIUM 8.3*  --   --   --  8.0* 7.8*  MG  --   --  1.7 2.2 2.3  --    Liver Function Tests:  Recent Labs Lab 03/10/15 1123 03/11/15 0453 03/12/15 0350  AST 35 34 36  ALT 25 26 28   ALKPHOS 126 127* 144*  BILITOT 1.1 1.1 0.6  PROT 5.0* 5.2* 5.4*  ALBUMIN 2.4* 2.4* 2.4*    Recent Labs Lab 03/10/15 1123  LIPASE 25   No results for input(s): AMMONIA in the last 168 hours. CBC:  Recent Labs Lab 03/10/15 1123 03/10/15 1142 03/11/15 0453 03/12/15 0350  WBC 15.5*  --  18.9* 16.7*  NEUTROABS 14.0*  --   --   --   HGB 11.2* 12.6 10.7* 10.8*  HCT 33.8* 37.0 32.2* 32.6*  MCV 95.5  --  95.3 95.6  PLT 114*  --  109* 126*   Cardiac Enzymes: No results for input(s): CKTOTAL, CKMB, CKMBINDEX, TROPONINI in the last 168 hours. BNP (last 3 results) No results for input(s): BNP in the last 8760 hours.  ProBNP (last 3 results) No results for input(s): PROBNP in the last 8760 hours.  CBG: No results for input(s): GLUCAP in the last 168  hours.  Recent Results (from the past 240 hour(s))  Culture, Urine     Status: None   Collection Time: 03/10/15 12:08 PM  Result Value Ref Range Status   Specimen Description URINE, CLEAN CATCH  Final   Special Requests NONE  Final   Culture   Final    2,000 COLONIES/mL INSIGNIFICANT GROWTH Performed at [facility]    Report Status 03/11/2015 FINAL  Final  Blood culture (routine x 2)     Status: None (Preliminary result)   Collection Time: 03/10/15  2:14 PM  Result Value Ref Range Status   Specimen Description BLOOD PORTA CATH  Final   Special Requests BOTTLES DRAWN AEROBIC AND ANAEROBIC 5ML  Final   Culture   Final    NO GROWTH < 24 HOURS Performed at [facility]    Report Status PENDING  Incomplete  Blood culture (routine x 2)     Status: None (Preliminary result)   Collection Time: 03/10/15  3:11 PM  Result Value Ref Range Status   Specimen Description BLOOD RIGHT HAND  Final   Special Requests IN PEDIATRIC BOTTLE 3 ML  Final   Culture   Final    NO GROWTH < 24 HOURS Performed at [facility]    Report Status PENDING  Incomplete     Studies: Dg Chest 2 View  03/10/2015  CLINICAL DATA:  Cough, congestion and shortness of breath. History of colon cancer and chemotherapy. EXAM: CHEST  2 VIEW COMPARISON:  01/14/2015 and chest CT 02/17/2015 FINDINGS: There is a right Port-A-Cath. Catheter tip near the superior cavoatrial junction. Again noted is surgical hardware in the cervical and upper thoracic spine. Slightly low lung volumes with patchy basilar densities. There is vascular fullness in the hilar regions. Heart size is normal. Negative for a pneumothorax. Trachea is midline. There is blunting at the left costophrenic angle and the lateral view demonstrates a small pleural effusion. Air-fluid level in the stomach. IMPRESSION: Slightly low lung volumes with vascular crowding in the hilar regions. Cannot exclude vascular congestion. Few patchy densities at  the lung bases could represent atelectasis and suspect a new small left pleural effusion. Electronically Signed   By: Markus Daft M.D.   On: 03/10/2015 12:15   Ct Head Wo Contrast  03/10/2015  CLINICAL DATA:  Generalized weakness and dizziness today. EXAM: CT HEAD WITHOUT CONTRAST TECHNIQUE: Contiguous axial images were obtained from the base of the skull through the vertex without intravenous contrast. COMPARISON:  None. FINDINGS: The ventricles are normal in size and configuration. No extra-axial fluid collections are identified. The gray-white differentiation is normal. No CT findings for acute intracranial process such as hemorrhage or infarction. No mass lesions. The brainstem and cerebellum are grossly normal. The bony structures are intact. The paranasal sinuses and mastoid air cells are clear. The globes are intact. IMPRESSION: Normal head CT. Electronically Signed   By: Marijo Sanes M.D.   On: 03/10/2015 21:56   Ct Abdomen Pelvis W Contrast  03/10/2015  CLINICAL DATA:  Weakness. History of colon carcinoma, post chemotherapy EXAM: CT ABDOMEN AND PELVIS WITH CONTRAST TECHNIQUE: Multidetector CT imaging of the abdomen and pelvis was performed using the standard protocol following bolus administration of intravenous contrast. CONTRAST:  89mL OMNIPAQUE IOHEXOL 300 MG/ML SOLN, 183mL OMNIPAQUE IOHEXOL 300 MG/ML SOLN COMPARISON:  02/17/2015 FINDINGS: New small bilateral pleural effusions with dependent atelectasis in the visualized lung bases. Interval decrease in pericardial effusion. Multiple large and confluent low-attenuation metastatic foci in the liver, nearly confluent throughout much of the left lobe and segment 4. A small amount of abdominal and pelvic ascites has increased. New wedge-shaped perfusion defects in the spleen suggesting infarcts. Splenic vein is patent but there are multiple retroperitoneal collateral channels to the left renal vein as before. Unremarkable adrenal glands, kidneys,  pancreas, gallbladder. No hydronephrosis. The stomach is non dilated. Small bowel and colon are nondilated. Fecal material distends the rectum. Urinary bladder physiologically distended. Intermediate attenuation left ovarian cystic lesion as before. No adenopathy. No free air. Mild aortoiliac arterial calcifications. Previous instrumented PLIF L3-L5. Poorly marginated sclerotic focus in T7 vertebral body as before. No new bone lesions identified. IMPRESSION: 1. New small pleural effusions and worsening abdominal ascites. 2. Multiple small splenic infarcts, new since prior exam. 3. Extensive hepatic metastatic disease as before. Electronically Signed   By: Lucrezia Europe M.D.   On: 03/10/2015 13:36   US Abdomen Limited  03/11/2015  CLINICAL DATA:  Suspected ascites, metastatic cholangiocarcinoma EXAM: LIMITED ABDOMEN ULTRASOUND FOR ASCITES TECHNIQUE: Limited ultrasound survey for ascites was performed in all four abdominal quadrants. COMPARISON:  Abdominal pelvic CT scan of March 10, 2015 FINDINGS: Multiple hepatic masses are observed. A small amount of ascites is present in the left upper quadrant. There is minimal ascites adjacent to the liver. There is a small right pleural effusion. IMPRESSION: There is ascites present but the volume is insufficient for successful paracentesis. Multiple hepatic masses are observed. Electronically Signed   By: David  Martinique M.D.   On: 03/11/2015 09:02    Scheduled Meds: . allopurinol  300 mg Oral Daily  . bifidobacterium infantis  1 capsule Oral Daily  . cefTRIAXone (ROCEPHIN)  IV  2 g Intravenous Q24H  . dexamethasone  1 drop Both Eyes 6 times per day  . dexamethasone  0.5 mg Oral BID  . docusate sodium  100 mg Oral BID  . enoxaparin (LOVENOX) injection  40 mg Subcutaneous Q24H  . fluconazole  100 mg Oral Daily  . levothyroxine  88 mcg Oral QAC breakfast  . oseltamivir  75 mg Oral BID  . potassium chloride SA  20 mEq Oral BID  . sodium chloride  3 mL Intravenous  Q12H  . sucralfate  1 g Oral TID WC & HS  . trimethoprim-polymyxin b  1 drop Both Eyes 6 times per day  . vancomycin  1,000 mg Intravenous Q12H   Continuous Infusions: . sodium chloride 75 mL/hr at 03/11/15 2108  . 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

## 2015-03-12 NOTE — Care Management Note (Signed)
Case Management Note  Patient Details  Name: Lindsay Aguirre MRN: [id number] Date of Birth: [birth year]  Subjective/Objective:   Patient used [facility] in the past-[facility] rep Tima aware to confirm if can accept referral.  Await HHPT order.[facility] dme rep already following for hospital bed for home.                 Action/Plan:d/c plan home w/HHPT.   Expected Discharge Date:   (UNKNOWN)               Expected Discharge Plan:  [place]  In-House Referral:     Discharge planning Services  CM Consult  Post Acute Care Choice:    Choice offered to:  Patient  DME Arranged:    DME Agency:     HH Arranged:  PT HH Agency:  [place]  Status of Service:  In process, will continue to follow  Medicare Important Message Given:    Date Medicare IM Given:    Medicare IM give by:    Date Additional Medicare IM Given:    Additional Medicare Important Message give by:     If discussed at [place] of Stay Meetings, dates discussed:    Additional Comments:  Dessa Phi, RN 03/12/2015, 4:12 PM

## 2015-03-12 NOTE — Progress Notes (Signed)
IP PROGRESS NOTE  Subjective:   Lindsay Aguirre reports that she had a good night. She has not reported any fevers or chills. She still have some abdominal distention but the pain is manageable with pain medication. Appetite is reasonable at this time.  Objective:  Vital signs in last 24 hours: Temp:  [97.9 F (36.6 C)-98.2 F (36.8 C)] 97.9 F (36.6 C) (11/10 0423) Pulse Rate:  [71-92] 71 (11/10 0423) Resp:  [16-18] 18 (11/10 0423) BP: (139-149)/(70-81) 141/81 mmHg (11/10 0423) SpO2:  [95 %-96 %] 95 % (11/10 0423) Weight:  [183 lb 10.3 oz (83.3 kg)] 183 lb 10.3 oz (83.3 kg) (11/10 0423) Weight change: 10 lb 10.3 oz (4.828 kg) Last BM Date: 03/08/15  Intake/Output from previous day: 11/09 0701 - 11/10 0700 In: 2860 [P.O.:600; I.V.:1810; IV Piggyback:450] Out: 2150 [Urine:2150] Alert and awake woman not in any distress. Mouth: mucous membranes moist, pharynx normal without lesions Resp: clear to auscultation bilaterally Cardio: regular rate and rhythm, S1, S2 normal, no murmur, click, rub or gallop GI: soft, non-tender; bowel sounds normal; no masses,  no organomegaly. Distended with shifting dullness. Extremities: extremities normal, atraumatic, no cyanosis or edema  Portacath without erythema  Lab Results:  Recent Labs  03/11/15 0453 03/12/15 0350  WBC 18.9* 16.7*  HGB 10.7* 10.8*  HCT 32.2* 32.6*  PLT 109* 126*    BMET  Recent Labs  03/11/15 0453 03/12/15 0350  NA 139 136  K 3.7 3.9  CL 105 105  CO2 26 26  GLUCOSE 107* 115*  BUN 10 11  CREATININE 0.52 0.58  CALCIUM 8.0* 7.8*    Studies/Results: Dg Chest 2 View  03/10/2015  CLINICAL DATA:  Cough, congestion and shortness of breath. History of colon cancer and chemotherapy. EXAM: CHEST  2 VIEW COMPARISON:  01/14/2015 and chest CT 02/17/2015 FINDINGS: There is a right Port-A-Cath. Catheter tip near the superior cavoatrial junction. Again noted is surgical hardware in the cervical and upper thoracic spine.  Slightly low lung volumes with patchy basilar densities. There is vascular fullness in the hilar regions. Heart size is normal. Negative for a pneumothorax. Trachea is midline. There is blunting at the left costophrenic angle and the lateral view demonstrates a small pleural effusion. Air-fluid level in the stomach. IMPRESSION: Slightly low lung volumes with vascular crowding in the hilar regions. Cannot exclude vascular congestion. Few patchy densities at the lung bases could represent atelectasis and suspect a new small left pleural effusion. Electronically Signed   By: Markus Daft M.D.   On: 03/10/2015 12:15   Ct Head Wo Contrast  03/10/2015  CLINICAL DATA:  Generalized weakness and dizziness today. EXAM: CT HEAD WITHOUT CONTRAST TECHNIQUE: Contiguous axial images were obtained from the base of the skull through the vertex without intravenous contrast. COMPARISON:  None. FINDINGS: The ventricles are normal in size and configuration. No extra-axial fluid collections are identified. The gray-white differentiation is normal. No CT findings for acute intracranial process such as hemorrhage or infarction. No mass lesions. The brainstem and cerebellum are grossly normal. The bony structures are intact. The paranasal sinuses and mastoid air cells are clear. The globes are intact. IMPRESSION: Normal head CT. Electronically Signed   By: Marijo Sanes M.D.   On: 03/10/2015 21:56   Ct Abdomen Pelvis W Contrast  03/10/2015  CLINICAL DATA:  Weakness. History of colon carcinoma, post chemotherapy EXAM: CT ABDOMEN AND PELVIS WITH CONTRAST TECHNIQUE: Multidetector CT imaging of the abdomen and pelvis was performed using the standard protocol following  bolus administration of intravenous contrast. CONTRAST:  8mL OMNIPAQUE IOHEXOL 300 MG/ML SOLN, 182mL OMNIPAQUE IOHEXOL 300 MG/ML SOLN COMPARISON:  02/17/2015 FINDINGS: New small bilateral pleural effusions with dependent atelectasis in the visualized lung bases. Interval  decrease in pericardial effusion. Multiple large and confluent low-attenuation metastatic foci in the liver, nearly confluent throughout much of the left lobe and segment 4. A small amount of abdominal and pelvic ascites has increased. New wedge-shaped perfusion defects in the spleen suggesting infarcts. Splenic vein is patent but there are multiple retroperitoneal collateral channels to the left renal vein as before. Unremarkable adrenal glands, kidneys, pancreas, gallbladder. No hydronephrosis. The stomach is non dilated. Small bowel and colon are nondilated. Fecal material distends the rectum. Urinary bladder physiologically distended. Intermediate attenuation left ovarian cystic lesion as before. No adenopathy. No free air. Mild aortoiliac arterial calcifications. Previous instrumented PLIF L3-L5. Poorly marginated sclerotic focus in T7 vertebral body as before. No new bone lesions identified. IMPRESSION: 1. New small pleural effusions and worsening abdominal ascites. 2. Multiple small splenic infarcts, new since prior exam. 3. Extensive hepatic metastatic disease as before. Electronically Signed   By: Lucrezia Europe M.D.   On: 03/10/2015 13:36   US Abdomen Limited  03/11/2015  CLINICAL DATA:  Suspected ascites, metastatic cholangiocarcinoma EXAM: LIMITED ABDOMEN ULTRASOUND FOR ASCITES TECHNIQUE: Limited ultrasound survey for ascites was performed in all four abdominal quadrants. COMPARISON:  Abdominal pelvic CT scan of March 10, 2015 FINDINGS: Multiple hepatic masses are observed. A small amount of ascites is present in the left upper quadrant. There is minimal ascites adjacent to the liver. There is a small right pleural effusion. IMPRESSION: There is ascites present but the volume is insufficient for successful paracentesis. Multiple hepatic masses are observed. Electronically Signed   By: David  Martinique M.D.   On: 03/11/2015 09:02    Medications: I have reviewed the patient's current  medications.  Assessment/Plan:  64 year old woman with the following issues:  1. 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 She has been on gemcitabine and have clearly progressed.   She was started on salvage chemotherapy in the form of FOLFOX last week.  She appears to is having recovered well and likely will receive the second cycle of chemotherapy as scheduled on 03/18/2015 if she is feeling up to it at that time and her labs are within normal range. I see no need for any dose reduction or delay at that time.  2. Abdominal ascites: Likely malignant in etiology. Attempted paracentesis did not reveal significant fluid for removal.  3. Questionable sepsis: She had an elevated lactic acid and was started on prophylactic antibiotics. Cultures are pending at this time.  4. 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 I would not recommend anticoagulation at this time.  I recommend continue supportive management and I have no objections to discharge the next 24 to 48 hrs. if she continues to improve clinically. Follow-up is scheduled on 03/18/2015 to receive her next cycle of chemotherapy.   LOS: 2 days   [facility] 03/12/2015, 7:42 AM

## 2015-03-12 NOTE — Progress Notes (Signed)
Physical Therapy Treatment Patient Details Name: Lindsay Aguirre MRN: [id number] DOB: August 17, 1950 Today's Date: 03/12/2015    History of Present Illness 64 yo female admitted with sepsis. Hx of colon cancer with mets to liver and spine, s/p laminectomy 01/2015 at [facility] and XRT to cervical/thoracic spine areas (C5-T10) and chemotherapy, HTN, NM d/o, otitis media. Recent d/c 02/21/15    PT Comments    Progressing with mobility. Pt tolerated session well.   Follow Up Recommendations  Home health PT;Supervision/Assistance - 24 hour     Equipment Recommendations  None recommended by PT    Recommendations for Other Services       Precautions / Restrictions Precautions Precautions: Fall Restrictions Weight Bearing Restrictions: No    Mobility  Bed Mobility Overal bed mobility: Modified Independent                Transfers Overall transfer level: Needs assistance   Transfers: Sit to/from Stand Sit to Stand: Min guard         General transfer comment: close guard for safety  Ambulation/Gait Ambulation/Gait assistance: Min guard Ambulation Distance (Feet): 175 Feet Assistive device:  (IV pole) Gait Pattern/deviations: Step-through pattern;Decreased stride length     General Gait Details: close guard for safety.    Stairs            Wheelchair Mobility    Modified Rankin (Stroke Patients Only)       Balance           Standing balance support: During functional activity Standing balance-Leahy Scale: Fair                      Cognition Arousal/Alertness: Awake/alert Behavior During Therapy: WFL for tasks assessed/performed Overall Cognitive Status: Within Functional Limits for tasks assessed                      Exercises      General Comments        Pertinent Vitals/Pain Pain Assessment: No/denies pain    Home Living                      Prior Function            PT Goals (current goals can now  be found in the care plan section) Progress towards PT goals: Progressing toward goals    Frequency  Min 3X/week    PT Plan Current plan remains appropriate    Co-evaluation             End of Session Equipment Utilized During Treatment: Gait belt Activity Tolerance: Patient tolerated treatment well Patient left: in bed;with call bell/phone within reach;with bed alarm set     Time: [id number] PT Time Calculation (min) (ACUTE ONLY): 8 min  Charges:  $Gait Training: 8-22 mins                    G Codes:      Weston Anna, MPT Pager: [phone]

## 2015-03-13 DIAGNOSIS — C801 Malignant (primary) neoplasm, unspecified: Secondary | ICD-10-CM

## 2015-03-13 LAB — GLUCOSE, CAPILLARY: Glucose-Capillary: 93 mg/dL (ref 65–99)

## 2015-03-13 MED ORDER — HEPARIN SOD (PORK) LOCK FLUSH 100 UNIT/ML IV SOLN
500.0000 [IU] | INTRAVENOUS | Status: AC | PRN
  Administered 2015-03-13: 500 [IU]

## 2015-03-13 MED ORDER — DEXAMETHASONE SODIUM PHOSPHATE 0.1 % OP SOLN: 1.0000 [drp] | OPHTHALMIC | Status: AC

## 2015-03-13 MED ORDER — POLYMYXIN B-TRIMETHOPRIM 10000-0.1 UNIT/ML-% OP SOLN: 1.0000 [drp] | OPHTHALMIC | Status: DC

## 2015-03-13 MED ORDER — AMOXICILLIN-POT CLAVULANATE 875-125 MG PO TABS: 1.0000 | ORAL_TABLET | Freq: Two times a day (BID) | ORAL | Status: DC

## 2015-03-13 NOTE — Progress Notes (Signed)
Instruction reviewed with patient. Questions, concerns were denied. Pt is ambulatory, will use wheelchair for transport out.

## 2015-03-13 NOTE — Progress Notes (Signed)
IP PROGRESS NOTE  Subjective:   No new complaints reported overnight. She felt comfortable and was able to tolerate diet well. She got nauseated after received morphine this morning.  Objective:  Vital signs in last 24 hours: Temp:  [98 F (36.7 C)-98.6 F (37 C)] 98 F (36.7 C) (11/11 0548) Pulse Rate:  [77-87] 77 (11/11 0548) Resp:  [18-20] 20 (11/11 0548) BP: (128-143)/(68-87) 143/87 mmHg (11/11 0548) SpO2:  [96 %-97 %] 96 % (11/11 0548) Weight:  [186 lb 4.6 oz (84.5 kg)] 186 lb 4.6 oz (84.5 kg) (11/11 0548) Weight change: 2 lb 10.3 oz (1.2 kg) Last BM Date: 03/12/15  Intake/Output from previous day: 11/10 0701 - 11/11 0700 In: [id number] [P.O.:240; I.V.:1200; IV Piggyback:250] Out: 2400 [Urine:2400] Alert and awake woman not in any distress. Mouth: mucous membranes moist, pharynx normal without lesions Resp: clear to auscultation bilaterally Cardio: regular rate and rhythm, S1, S2 normal, no murmur, click, rub or gallop GI: Soft but slightly distended. No rebound or guarding. Good bowel sounds noted. Extremities: extremities normal, atraumatic, no cyanosis or edema  Portacath without erythema  Lab Results:  Recent Labs  03/11/15 0453 03/12/15 0350  WBC 18.9* 16.7*  HGB 10.7* 10.8*  HCT 32.2* 32.6*  PLT 109* 126*    BMET  Recent Labs  03/11/15 0453 03/12/15 0350  NA 139 136  K 3.7 3.9  CL 105 105  CO2 26 26  GLUCOSE 107* 115*  BUN 10 11  CREATININE 0.52 0.58  CALCIUM 8.0* 7.8*    Studies/Results: No results found.  Medications: I have reviewed the patient's current medications.  Assessment/Plan:  64 year old woman with the following issues:  1. 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 She has been on gemcitabine and have clearly progressed.   Her second cycle of FOLFOX chemotherapy scheduled on 03/18/2015. I have no objections to proceeding with that chemotherapy cycle if she is feeling up to  it on that day. There is no contraindication from a laboratory standpoint of her physical condition.   2. Abdominal ascites: Likely malignant in etiology. Attempted paracentesis did not reveal significant fluid for removal.  3. Questionable sepsis: no clear evidence of infection at this time.   4. 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 I would not recommend anticoagulation at this time.   I have no objections for discharge at this time follow-up is on 03/18/2015 to receive the next cycle of chemotherapy.   LOS: 3 days   [facility] 03/13/2015, 9:37 AM

## 2015-03-13 NOTE — Discharge Summary (Addendum)
Physician Discharge Summary  Lindsay Aguirre [id number] DOB: [birth year] DOA: 03/10/2015  PCP: Chevis Pretty, FNP  Admit date: 03/10/2015 Discharge date: 03/13/2015  Time spent: 60 minutes  Recommendations for Outpatient Follow-up:  1. Follow up with Oncology next week. 2. Discharged to home with home health services.  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 Recommend continuing soft diet, thin liquids and medications with puree.  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 Patient also noted to have C7 metastatic lesion (8/16), she is currently on systemic chemotherapy. She also has a history of hypertension, hypothyroidism, hyperlipidemia and diverticulosis.   She presented to the ED on 11/8 with complaint of cough, abdominal distention, and swelling with abdominal pain, dizziness, nausea, vomiting x 1 week. She denied fever, chills, shortness of breath, diarrhea, constipation, dysuria, melena, hematemesis, and hematochezia. She had recently been seen by PCP for otitis media, on amoxicillin. Patient had a respiratory rate of 20 , WBC of 15.5  and elevated lactic acid of 4. CT abdomen showed worsening abdominal ascites and small splenic infarcts. US abdomen confirmed extensive metastatic disease, with ascites insufficient for paracentesis. Patient improved over night with IV Rocephin and IV vancomycin.   Hospital Course:  11/8 Presented to ED with cough, abdominal distention, abdominal pain, dizziness and nausea x 1 week. In addition she was being treated by PCP for otitis media. At the time of admission she had a respiratory rate of 20 , WBC of 15.5 and elevated lactic acid of 4. CT abdomen showed worsening abdominal ascites and small splenic infarcts. US abdomen confirmed extensive metastatic disease, with ascites insufficient for paracentesis. Started on Vancomycin, Rocephin and IV hydration. Also with hypokalemia given potassium chloride. 11/9 Patient with bilateral conjunctivitis, dexamethazone and Polytrim drops given. Influenza PCR, urine culture and CXR negative. Hypokalemia resolved. Patient followed by oncology. 11/10 Patient with increased itching and redness of the umbilicus, CT reviewed for possible abscess or metastasis, topical Nystatin was given. Conjunctivitis improved. Patient with less abdominal discomfort, able to eat and drink well. 11/11 Conjunctivitis resolved, umbilicus rash improved. Vitals within normal limits. Patient feeling well and in stable condition.  Sepsis -Presented to ED om 11/8 with respiratory rate of 20, white count of 15.5 and elevated lactic acid of 4. -serial lactic acid levels, 3.5 yesterday -Influenza PCR-negative -Pro calcitonin level 1.31 indicates bacterial infection -Blood cultures NGTD -Urine cultures and CXR are negative. -Daily CBC, BMP , WBC trending down -vitals stable -Was on vancomycin and rocephin as inpatient, discharged on Augmentin to complete 10 days of antibiotics.  Multifocal hepatic metastasis of adenocarcinoma -Pancreatic or biliary origin, receiving systemic  chemotherapy, last treatment  was last week. -followed by oncology, resume chemotherapy upon discharge, has appointment set up at the Cancer center 03/18/15  Abdominal ascites -US abdomen showed multiple hepatic masses. Small amount of ascites present in the left upper quadrant. Amount insufficient for successful paracentesis.  Hypokalemia -resolved, potassium today 3.9.  Anemia of chronic disease -fecal occult blood negative -follow H and H. Stable at this time.  Recent otitis media of the right ear -Stop amoxicillin. Patient on IV Rocephin, discharged on Augmentin.  Dysphagia -Appreciate speech and language evaluation -Dysphagia 3 diet -Patient is on FOLFOX chemotherapy, probably has some degree of mucositis which causing the dysphagia/odynophagia.  Hypertension -Stable.   Hypothyroidism -continue on home Synthroid.  Splenic infarcts -Noted on CT abdomen and pelvis -Likely secondary to hypercoagulable state from her metastatic cancer -supportive care -Medical oncology recommended no anticoagulation at this time  Acute Conjunctivitis -bilateral with pain -likely secondary to FOLFOX chemotherapy -Started on Polytrim and dexamethazone drops, discharged with the same drops regimen.  Rash -umbilical region, with discharge and itching several days ago -On nystatin cream, reviewed with radiology, no deep infection, abscess or evidence of metastasis.  Procedures:  none  Consultations:  none  Discharge Exam: Filed Vitals:   03/13/15 0548  BP: 143/87  Pulse: 77  Temp: 98 F (36.7 C)  Resp: 20    General: Obese, well-developed well-nourished, in no acute distress. Occasional coughing.  Eyes: Injected conjunctiva bilaterally. Negative for exudate. PERRLA, EOMI, normal lids, irises.  ENT: grossly normal hearing, lips & tongue  Neck: no LAD, masses or thyromegaly  Cardiovascular: RRR, no m/r/g. No LE edema.  Respiratory: CTA bilaterally, no w/r/r. Normal  respiratory effort.  Abdomen: soft, tender to palpation on the right side. Hepatosplenomegaly. Positive bowel sounds.  Skin: Erythema with no discharge of the umbilicus , no induration   Musculoskeletal: grossly normal tone BUE/BLE  Psychiatric: grossly normal mood and affect, speech fluent and appropriate  Neurologic: Alert and oriented 3. Cranial nerves II through XII are grossly intact. No focal deficits.   Discharge Instructions    Current Discharge Medication List    START taking these medications   Details  amoxicillin-clavulanate (AUGMENTIN) 875-125 MG tablet Take 1 tablet by mouth 2 (two) times daily. Qty: 14 tablet, Refills: 0    dexamethasone (DECADRON) 0.1 % ophthalmic solution Place 1 drop into both eyes every 4 (four) hours. Qty: 5 mL, Refills: 0    trimethoprim-polymyxin b (POLYTRIM) ophthalmic solution Place 1 drop into both eyes every 4 (four) hours. Qty: 10 mL, Refills: 0      CONTINUE these medications which have NOT CHANGED   Details  allopurinol (ZYLOPRIM) 300 MG tablet TAKE 1 TABLET (300 MG TOTAL) BY MOUTH DAILY. Qty: 30 tablet, Refills: 2    bifidobacterium infantis (ALIGN) capsule Take 1 capsule by mouth daily.    dexamethasone (DECADRON) 0.5 MG/5ML solution Take 5 cc BID. Qty: 500 mL, Refills: 0    lidocaine-prilocaine (EMLA) cream Apply 1 application topically as needed. Apply cream, on days of treatment, to [facility] site 1-2 hours prior to treatment. Qty: 30 g, Refills: 0    !! ondansetron (ZOFRAN) 8 MG tablet Take 1 tablet (8 mg total) by mouth every 8 (eight) hours as needed for nausea or vomiting. Qty: 30 tablet, Refills: 1    polyethylene glycol (MIRALAX / GLYCOLAX) packet Take 17 g by mouth daily as needed for mild constipation.    prochlorperazine (COMPAZINE) 10 MG tablet Take 1 tablet (10 mg total) by mouth every 6 (  six) hours as needed for nausea or vomiting. Qty: 30 tablet, Refills: 1    SYNTHROID 88 MCG tablet TAKE 1 TABLET (88 MCG  TOTAL) BY MOUTH DAILY BEFORE BREAKFAST. Qty: 30 tablet, Refills: 0    temazepam (RESTORIL) 15 MG capsule Take 1 capsule (15 mg total) by mouth at bedtime as needed for sleep. Qty: 30 capsule, Refills: 0   Associated Diagnoses: Cholangiocarcinoma ([place])    albuterol (PROVENTIL HFA;VENTOLIN HFA) 108 (90 BASE) MCG/ACT inhaler Inhale 2 puffs into the lungs every 6 (six) hours as needed for wheezing or shortness of breath. Qty: 1 Inhaler, Refills: 0    fluconazole (DIFLUCAN) 100 MG tablet Take 1 tablet (100 mg total) by mouth daily. Qty: 21 tablet, Refills: 0   Associated Diagnoses: Cholangiocarcinoma ([place])    morphine (MSIR) 15 MG tablet Take 1 tablet (15 mg total) by mouth every 4 (four) hours as needed for severe pain. Qty: 30 tablet, Refills: 0    morphine 10 MG/5ML solution Take 2.5-5 mLs (5-10 mg total) by mouth every 4 (four) hours as needed for severe pain. Qty: 15 mL, Refills: 0    !! ondansetron (ZOFRAN) 8 MG tablet Take 1 tablet (8 mg total) by mouth every 8 (eight) hours as needed for nausea or vomiting. Qty: 30 tablet, Refills: 1    potassium chloride SA (K-DUR,KLOR-CON) 20 MEQ tablet Take 1 tablet (20 mEq total) by mouth 2 (two) times daily. Qty: 60 tablet, Refills: 0   Associated Diagnoses: Cholangiocarcinoma ([place]); Hypokalemia    sucralfate (CARAFATE) 1 G tablet Take 1 tablet (1 g total) by mouth 4 (four) times daily -  with meals and at bedtime. Qty: 50 tablet, Refills: 1   Associated Diagnoses: Cholangiocarcinoma ([place])     !! - Potential duplicate medications found. Please discuss with provider.    STOP taking these medications     amoxicillin (AMOXIL) 500 MG capsule        Allergies  Allergen Reactions  . Livalo [Pitavastatin]     Leg cramps  . Rosuvastatin Calcium     Other reaction(s): Cramps (ALLERGY/intolerance) Leg Cramps   . Simvastatin     Leg cramps  . Statins     Leg Cramps    Follow-up Information    Call [facility].   Why:   As needed   Contact information:   [facility]  [address]  [place], [place] [address]  [phone]       The results of significant diagnostics from this hospitalization (including imaging, microbiology, ancillary and laboratory) are listed below for reference.    Significant Diagnostic Studies: Dg Chest 2 View  03/10/2015  CLINICAL DATA:  Cough, congestion and shortness of breath. History of colon cancer and chemotherapy. EXAM: CHEST  2 VIEW COMPARISON:  01/14/2015 and chest CT 02/17/2015 FINDINGS: There is a right Port-A-Cath. Catheter tip near the superior cavoatrial junction. Again noted is surgical hardware in the cervical and upper thoracic spine. Slightly low lung volumes with patchy basilar densities. There is vascular fullness in the hilar regions. Heart size is normal. Negative for a pneumothorax. Trachea is midline. There is blunting at the left costophrenic angle and the lateral view demonstrates a small pleural effusion. Air-fluid level in the stomach. IMPRESSION: Slightly low lung volumes with vascular crowding in the hilar regions. Cannot exclude vascular congestion. Few patchy densities at the lung bases could represent atelectasis and suspect a new small left pleural effusion. Electronically Signed   By: Quita Skye  Anselm Pancoast M.D.   On: 03/10/2015 12:15   Ct Head Wo Contrast  03/10/2015  CLINICAL DATA:  Generalized weakness and dizziness today. EXAM: CT HEAD WITHOUT CONTRAST TECHNIQUE: Contiguous axial images were obtained from the base of the skull through the vertex without intravenous contrast. COMPARISON:  None. FINDINGS: The ventricles are normal in size and configuration. No extra-axial fluid collections are identified. The gray-white differentiation is normal. No CT findings for acute intracranial process such as hemorrhage or infarction. No mass lesions. The brainstem and cerebellum are grossly normal. The bony structures are intact. The paranasal sinuses and mastoid air  cells are clear. The globes are intact. IMPRESSION: Normal head CT. Electronically Signed   By: Marijo Sanes M.D.   On: 03/10/2015 21:56   Ct Soft Tissue Neck W Contrast  02/19/2015  CLINICAL DATA:  Neck pain since spine surgery in March. Cholangiocarcinoma. EXAM: CT NECK WITH CONTRAST TECHNIQUE: Multidetector CT imaging of the neck was performed using the standard protocol following the bolus administration of intravenous contrast. CONTRAST:  78mL OMNIPAQUE IOHEXOL 300 MG/ML  SOLN COMPARISON:  Cervical spine MRI 12/05/2014 FINDINGS: Pharynx and larynx: No asymmetric mass or inflammatory enhancement. Mild lower pharyngeal and laryngeal edema suspected from radiation. Salivary glands: Mild edema within the submandibular glands, likely radiation related. Thyroid: Negative Lymph nodes: No enlarged or necrotic appearing lymph nodes. Vascular: Major vessels are patent Limited intracranial: Minimal imaging is negative for metastatic disease. Visualized orbits: Negative Mastoids and visualized paranasal sinuses: Negative Skeleton: New metastasis within the posterior aspect of the T3 vertebral body. The lesion erodes the dorsal cortex without gross extra-spinal spread. Extensive posterior element metastatic disease involving the fused C6-7 levels has been treated with decompressive laminectomy from C4-T1 and presumed facetectomy. There is pre-existing C5-6 and C6-7 discectomies with complete bony fusion. Despite the surgery, there is progressive destruction of the bilateral C7 articular process with larger bilateral vertebral body erosion. The C6-7 foramina are compromised by tumor; assessment of the canal is not possible due to extensive streak artifact from hardware which now extends posteriorly from C3-T2. No evidence of hardware fracture or displacement. Upper chest: Thickened appearance of the upper esophagus with prominent mucosal enhancement, likely radiation related if performed. No apical pulmonary nodules.  IMPRESSION: 1. Progressive C6 and C7 metastasis with bilateral C6-7 foramina involvement. Cervical canal cannot be evaluated due to CT limitations and hardware artifact. 2. New T3 vertebral body metastasis. 3. Mild inflammation in the visceral neck correlating with radiation therapy. Electronically Signed   By: Monte Fantasia M.D.   On: 02/19/2015 15:07   Ct Chest W Contrast  02/17/2015  CLINICAL DATA:  64 year old female with history of cholangiocarcinoma diagnosed in April 2015. Most recent chemotherapy completed in September 2016. Followup study. EXAM: CT CHEST, ABDOMEN, AND PELVIS WITH CONTRAST TECHNIQUE: Multidetector CT imaging of the chest, abdomen and pelvis was performed following the standard protocol during bolus administration of intravenous contrast. CONTRAST:  165mL OMNIPAQUE IOHEXOL 300 MG/ML  SOLN COMPARISON:  CT the abdomen and pelvis 10/07/2014. Chest CT 10/29/2013. FINDINGS: CT CHEST FINDINGS Mediastinum/Nodes: Heart size is normal. Small amount of new pericardial fluid and/or thickening, unlikely to be of any hemodynamic significance at this time, however, there is a small focus of potential pericardial calcification identified on image 72 of series 4, which could indicate some pericardial metastasis. There is atherosclerosis of the thoracic aorta, the great vessels of the mediastinum and the coronary arteries, including calcified atherosclerotic plaque in the left main and left anterior descending coronary  arteries. No pathologically enlarged mediastinal or hilar lymph nodes. Esophagus is unremarkable in appearance. No axillary lymphadenopathy. Right internal jugular single-lumen porta cath with tip terminating at the superior cavoatrial junction. Lungs/Pleura: New 5 mm nodule in the posterior aspect of the left lower lobe (image 36 of series 7). 3 mm nodule in the lateral segment of the right middle lobe (image 30 of series 7) is unchanged. No acute consolidative airspace disease. No  pleural effusions. Musculoskeletal: New lytic lesion measuring 11 mm in the posterior aspect of the T3 vertebral body (image 12 of series 7). Orthopedic fixation hardware at T1 and T2. CT ABDOMEN PELVIS FINDINGS Hepatobiliary: Marked progression of disease throughout the liver. Specifically, there is now a very large confluent mass occupying the majority of the left lobe of the liver as well as much of segment 5. Previously described hypervascular lesion at the junction of segments 2 and 4A in the left lobe of the liver is now inseparable from the larger mass. Previously noted index lesion in segment 8 has significantly increased in size (image 35 of series 2), currently 3.9 x 3.0 cm (previously 2.1 x 1.2 cm). Previously noted lesion in segment 7 of the liver has also significantly increased in size (image 21 of series 2), currently measuring 4.2 x 5.1 cm (previously 3.2 x 2.8 cm). Multiple lung new lesions are noted as well. No intra or extrahepatic biliary ductal dilatation. Gallbladder is unremarkable in appearance. Pancreas: No pancreatic mass. No pancreatic ductal dilatation. No pancreatic or peripancreatic fluid or inflammatory changes. Spleen: Spleen is borderline enlarged measuring 13.0 x 5.7 x 8.9 cm (estimated splenic volume of 330 mL). Adrenals/Urinary Tract: Bilateral kidneys and bilateral adrenal glands are normal in appearance. No hydroureteronephrosis. Urinary bladder is normal in appearance. Stomach/Bowel: Normal appearance of the stomach. No pathologic dilatation of small bowel or colon. Vascular/Lymphatic: Atherosclerosis throughout the abdominal and pelvic vasculature, without evidence of aneurysm or dissection. Portal vein is patent. Splenic vein is also patent, but there are numerous splenorenal collaterals on the left side. Additionally, there are numerous collaterals between the left renal vein and the left gonadal vein. Numerous prominent upper abdominal lymph nodes measuring up to 1 cm in  short axis in the portacaval nodal station. Reproductive: Uterus and right ovary are unremarkable. 3.4 x 3.1 cm well-defined intermediate attenuation lesion in the left adnexal region is similar to the prior study, likely a mildly proteinaceous left ovarian cyst. Other: Haziness of the omentum immediately anterior to the liver, best appreciated on image 86 of series 4, increased compared to the prior examination. Small volume of ascites. No pneumoperitoneum. Musculoskeletal: Status post PLIF from L3-L5 with interbody grafts at L3-L4 and L4-L5. There are no aggressive appearing lytic or blastic lesions noted in the visualized portions of the skeleton. IMPRESSION: 1. Today's study demonstrates severe progression of disease with marked enlargement of numerous masses and nodules in the liver. In addition, there is a new osseous metastasis in the posterior aspect of the T3 vertebral body. There is also a new left lower lobe pulmonary nodule measuring 5 mm (image 36 of series 7) which is nonspecific but suspicious, and there is a small amount of pericardial fluid and/or thickening with some pericardial enhancement on today's study which suggests potential metastatic disease to the pericardium. There is also slight haziness in the omentum overlying the liver, which could simply represent some edema, however, the possibility of early intraperitoneal spread of disease with omental involvement is not excluded. 2. Small volume of ascites.  3. Borderline splenomegaly. Worsening portosystemic collateralization, as evidenced by enlarging left-sided splenorenal collaterals and collaterals into the left gonadal vein. Portal vein remains patent at this time. 4. Additional incidental findings, as above, similar to the prior study. Electronically Signed   By: Vinnie Langton M.D.   On: 02/17/2015 17:26   Ct Abdomen Pelvis W Contrast  03/10/2015  CLINICAL DATA:  Weakness. History of colon carcinoma, post chemotherapy EXAM: CT  ABDOMEN AND PELVIS WITH CONTRAST TECHNIQUE: Multidetector CT imaging of the abdomen and pelvis was performed using the standard protocol following bolus administration of intravenous contrast. CONTRAST:  27mL OMNIPAQUE IOHEXOL 300 MG/ML SOLN, 1100mL OMNIPAQUE IOHEXOL 300 MG/ML SOLN COMPARISON:  02/17/2015 FINDINGS: New small bilateral pleural effusions with dependent atelectasis in the visualized lung bases. Interval decrease in pericardial effusion. Multiple large and confluent low-attenuation metastatic foci in the liver, nearly confluent throughout much of the left lobe and segment 4. A small amount of abdominal and pelvic ascites has increased. New wedge-shaped perfusion defects in the spleen suggesting infarcts. Splenic vein is patent but there are multiple retroperitoneal collateral channels to the left renal vein as before. Unremarkable adrenal glands, kidneys, pancreas, gallbladder. No hydronephrosis. The stomach is non dilated. Small bowel and colon are nondilated. Fecal material distends the rectum. Urinary bladder physiologically distended. Intermediate attenuation left ovarian cystic lesion as before. No adenopathy. No free air. Mild aortoiliac arterial calcifications. Previous instrumented PLIF L3-L5. Poorly marginated sclerotic focus in T7 vertebral body as before. No new bone lesions identified. IMPRESSION: 1. New small pleural effusions and worsening abdominal ascites. 2. Multiple small splenic infarcts, new since prior exam. 3. Extensive hepatic metastatic disease as before. Electronically Signed   By: Lucrezia Europe M.D.   On: 03/10/2015 13:36   Ct Abdomen Pelvis W Contrast  02/17/2015  CLINICAL DATA:  64 year old female with history of cholangiocarcinoma diagnosed in April 2015. Most recent chemotherapy completed in September 2016. Followup study. EXAM: CT CHEST, ABDOMEN, AND PELVIS WITH CONTRAST TECHNIQUE: Multidetector CT imaging of the chest, abdomen and pelvis was performed following the  standard protocol during bolus administration of intravenous contrast. CONTRAST:  167mL OMNIPAQUE IOHEXOL 300 MG/ML  SOLN COMPARISON:  CT the abdomen and pelvis 10/07/2014. Chest CT 10/29/2013. FINDINGS: CT CHEST FINDINGS Mediastinum/Nodes: Heart size is normal. Small amount of new pericardial fluid and/or thickening, unlikely to be of any hemodynamic significance at this time, however, there is a small focus of potential pericardial calcification identified on image 72 of series 4, which could indicate some pericardial metastasis. There is atherosclerosis of the thoracic aorta, the great vessels of the mediastinum and the coronary arteries, including calcified atherosclerotic plaque in the left main and left anterior descending coronary arteries. No pathologically enlarged mediastinal or hilar lymph nodes. Esophagus is unremarkable in appearance. No axillary lymphadenopathy. Right internal jugular single-lumen porta cath with tip terminating at the superior cavoatrial junction. Lungs/Pleura: New 5 mm nodule in the posterior aspect of the left lower lobe (image 36 of series 7). 3 mm nodule in the lateral segment of the right middle lobe (image 30 of series 7) is unchanged. No acute consolidative airspace disease. No pleural effusions. Musculoskeletal: New lytic lesion measuring 11 mm in the posterior aspect of the T3 vertebral body (image 12 of series 7). Orthopedic fixation hardware at T1 and T2. CT ABDOMEN PELVIS FINDINGS Hepatobiliary: Marked progression of disease throughout the liver. Specifically, there is now a very large confluent mass occupying the majority of the left lobe of the liver as well  as much of segment 5. Previously described hypervascular lesion at the junction of segments 2 and 4A in the left lobe of the liver is now inseparable from the larger mass. Previously noted index lesion in segment 8 has significantly increased in size (image 35 of series 2), currently 3.9 x 3.0 cm (previously 2.1 x  1.2 cm). Previously noted lesion in segment 7 of the liver has also significantly increased in size (image 21 of series 2), currently measuring 4.2 x 5.1 cm (previously 3.2 x 2.8 cm). Multiple lung new lesions are noted as well. No intra or extrahepatic biliary ductal dilatation. Gallbladder is unremarkable in appearance. Pancreas: No pancreatic mass. No pancreatic ductal dilatation. No pancreatic or peripancreatic fluid or inflammatory changes. Spleen: Spleen is borderline enlarged measuring 13.0 x 5.7 x 8.9 cm (estimated splenic volume of 330 mL). Adrenals/Urinary Tract: Bilateral kidneys and bilateral adrenal glands are normal in appearance. No hydroureteronephrosis. Urinary bladder is normal in appearance. Stomach/Bowel: Normal appearance of the stomach. No pathologic dilatation of small bowel or colon. Vascular/Lymphatic: Atherosclerosis throughout the abdominal and pelvic vasculature, without evidence of aneurysm or dissection. Portal vein is patent. Splenic vein is also patent, but there are numerous splenorenal collaterals on the left side. Additionally, there are numerous collaterals between the left renal vein and the left gonadal vein. Numerous prominent upper abdominal lymph nodes measuring up to 1 cm in short axis in the portacaval nodal station. Reproductive: Uterus and right ovary are unremarkable. 3.4 x 3.1 cm well-defined intermediate attenuation lesion in the left adnexal region is similar to the prior study, likely a mildly proteinaceous left ovarian cyst. Other: Haziness of the omentum immediately anterior to the liver, best appreciated on image 86 of series 4, increased compared to the prior examination. Small volume of ascites. No pneumoperitoneum. Musculoskeletal: Status post PLIF from L3-L5 with interbody grafts at L3-L4 and L4-L5. There are no aggressive appearing lytic or blastic lesions noted in the visualized portions of the skeleton. IMPRESSION: 1. Today's study demonstrates severe  progression of disease with marked enlargement of numerous masses and nodules in the liver. In addition, there is a new osseous metastasis in the posterior aspect of the T3 vertebral body. There is also a new left lower lobe pulmonary nodule measuring 5 mm (image 36 of series 7) which is nonspecific but suspicious, and there is a small amount of pericardial fluid and/or thickening with some pericardial enhancement on today's study which suggests potential metastatic disease to the pericardium. There is also slight haziness in the omentum overlying the liver, which could simply represent some edema, however, the possibility of early intraperitoneal spread of disease with omental involvement is not excluded. 2. Small volume of ascites. 3. Borderline splenomegaly. Worsening portosystemic collateralization, as evidenced by enlarging left-sided splenorenal collaterals and collaterals into the left gonadal vein. Portal vein remains patent at this time. 4. Additional incidental findings, as above, similar to the prior study. Electronically Signed   By: Vinnie Langton M.D.   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 COMPARISON:  Abdominal pelvic CT scan of March 10, 2015 FINDINGS: Multiple hepatic masses are observed. A small amount of ascites is present in the left upper quadrant. There is minimal ascites adjacent to the liver. There is a small right pleural effusion. IMPRESSION: There is ascites present but the volume is  insufficient for successful paracentesis. Multiple hepatic masses are observed. Electronically Signed   By: David  Martinique M.D.   On: 03/11/2015 09:02    Microbiology: Recent Results (from the past 240 hour(s))  Culture, Urine     Status: None   Collection Time: 03/10/15 12:08  PM  Result Value Ref Range Status   Specimen Description URINE, CLEAN CATCH  Final   Special Requests NONE  Final   Culture   Final    2,000 COLONIES/mL INSIGNIFICANT GROWTH Performed at [facility]    Report Status 03/11/2015 FINAL  Final  Blood culture (routine x 2)     Status: None (Preliminary result)   Collection Time: 03/10/15  2:14 PM  Result Value Ref Range Status   Specimen Description BLOOD PORTA CATH  Final   Special Requests BOTTLES DRAWN AEROBIC AND ANAEROBIC 5ML  Final   Culture   Final    NO GROWTH 2 DAYS Performed at [facility]    Report Status PENDING  Incomplete  Blood culture (routine x 2)     Status: None (Preliminary result)   Collection Time: 03/10/15  3:11 PM  Result Value Ref Range Status   Specimen Description BLOOD RIGHT HAND  Final   Special Requests IN PEDIATRIC BOTTLE 3 ML  Final   Culture   Final    NO GROWTH 2 DAYS Performed at [facility]    Report Status PENDING  Incomplete     Labs: Basic Metabolic Panel:  Recent Labs Lab 03/10/15 1123 03/10/15 1142 03/10/15 1510 03/10/15 1745 03/11/15 0453 03/12/15 0350  NA 137 139  --   --  139 136  K 2.9* 2.9*  --   --  3.7 3.9  CL 102 98*  --   --  105 105  CO2 27  --   --   --  26 26  GLUCOSE 106* 100*  --   --  107* 115*  BUN 12 11  --   --  10 11  CREATININE 0.51 0.40*  --   --  0.52 0.58  CALCIUM 8.3*  --   --   --  8.0* 7.8*  MG  --   --  1.7 2.2 2.3  --    Liver Function Tests:  Recent Labs Lab 03/10/15 1123 03/11/15 0453 03/12/15 0350  AST 35 34 36  ALT 25 26 28   ALKPHOS 126 127* 144*  BILITOT 1.1 1.1 0.6  PROT 5.0* 5.2* 5.4*  ALBUMIN 2.4* 2.4* 2.4*    Recent Labs Lab 03/10/15 1123  LIPASE 25   No results for input(s): AMMONIA in the last 168 hours. CBC:  Recent Labs Lab 03/10/15 1123 03/10/15 1142 03/11/15 0453 03/12/15 0350  WBC 15.5*  --  18.9* 16.7*  NEUTROABS 14.0*  --   --   --   HGB 11.2* 12.6 10.7* 10.8*  HCT 33.8* 37.0  32.2* 32.6*  MCV 95.5  --  95.3 95.6  PLT 114*  --  109* 126*   Cardiac Enzymes: No results for input(s): CKTOTAL, CKMB, CKMBINDEX, TROPONINI in the last 168 hours. BNP: BNP (last 3 results) No results for input(s): BNP in the last 8760 hours.  ProBNP (last 3 results) No results for input(s): PROBNP in the last 8760 hours.  CBG:  Recent Labs Lab 03/13/15 0748  GLUCAP 93       Signed:  Lawernce Keas  PA-S [facility] 03/13/2015, 9:18 AM   Birdie Hopes Pager: [phone]  03/13/2015, 11:13 AM

## 2015-03-14 ENCOUNTER — Emergency Department (HOSPITAL_COMMUNITY): Payer: Medicare Other

## 2015-03-14 ENCOUNTER — Emergency Department (HOSPITAL_COMMUNITY)
Admission: EM | Admit: 2015-03-14 | Discharge: 2015-03-14 | Disposition: A | Discharge status: Home / Self Care | Payer: Medicare Other | Attending: Emergency Medicine | Admitting: Emergency Medicine

## 2015-03-14 ENCOUNTER — Encounter (HOSPITAL_COMMUNITY): Payer: Self-pay

## 2015-03-14 DIAGNOSIS — M1990 Unspecified osteoarthritis, unspecified site: Secondary | ICD-10-CM | POA: Diagnosis not present

## 2015-03-14 DIAGNOSIS — Z7952 Long term (current) use of systemic steroids: Secondary | ICD-10-CM | POA: Diagnosis not present

## 2015-03-14 DIAGNOSIS — R2233 Localized swelling, mass and lump, upper limb, bilateral: Secondary | ICD-10-CM | POA: Insufficient documentation

## 2015-03-14 DIAGNOSIS — Z8509 Personal history of malignant neoplasm of other digestive organs: Secondary | ICD-10-CM | POA: Insufficient documentation

## 2015-03-14 DIAGNOSIS — R2243 Localized swelling, mass and lump, lower limb, bilateral: Secondary | ICD-10-CM | POA: Diagnosis not present

## 2015-03-14 DIAGNOSIS — E039 Hypothyroidism, unspecified: Secondary | ICD-10-CM | POA: Diagnosis not present

## 2015-03-14 DIAGNOSIS — M109 Gout, unspecified: Secondary | ICD-10-CM | POA: Diagnosis not present

## 2015-03-14 DIAGNOSIS — Z79899 Other long term (current) drug therapy: Secondary | ICD-10-CM | POA: Diagnosis not present

## 2015-03-14 DIAGNOSIS — I10 Essential (primary) hypertension: Secondary | ICD-10-CM | POA: Insufficient documentation

## 2015-03-14 DIAGNOSIS — R600 Localized edema: Secondary | ICD-10-CM

## 2015-03-14 LAB — URINE MICROSCOPIC-ADD ON

## 2015-03-14 LAB — URINALYSIS, ROUTINE W REFLEX MICROSCOPIC
Bilirubin Urine: NEGATIVE
Glucose, UA: NEGATIVE mg/dL
KETONES UR: NEGATIVE mg/dL
LEUKOCYTES UA: NEGATIVE
Nitrite: NEGATIVE
PROTEIN: NEGATIVE mg/dL
Specific Gravity, Urine: 1.011 (ref 1.005–1.030)
UROBILINOGEN UA: 0.2 mg/dL (ref 0.0–1.0)
pH: 6.5 (ref 5.0–8.0)

## 2015-03-14 LAB — COMPREHENSIVE METABOLIC PANEL
ALT: 21 U/L (ref 14–54)
AST: 32 U/L (ref 15–41)
Albumin: 2 g/dL — ABNORMAL LOW (ref 3.5–5.0)
Alkaline Phosphatase: 125 U/L (ref 38–126)
Anion gap: 8 (ref 5–15)
BUN: 10 mg/dL (ref 6–20)
CHLORIDE: 109 mmol/L (ref 101–111)
CO2: 21 mmol/L — ABNORMAL LOW (ref 22–32)
CREATININE: 0.4 mg/dL — AB (ref 0.44–1.00)
Calcium: 7.2 mg/dL — ABNORMAL LOW (ref 8.9–10.3)
Glucose, Bld: 89 mg/dL (ref 65–99)
POTASSIUM: 3.6 mmol/L (ref 3.5–5.1)
Sodium: 138 mmol/L (ref 135–145)
Total Bilirubin: 0.7 mg/dL (ref 0.3–1.2)
Total Protein: 4.4 g/dL — ABNORMAL LOW (ref 6.5–8.1)

## 2015-03-14 LAB — CBC WITH DIFFERENTIAL/PLATELET
Basophils Absolute: 0 10*3/uL (ref 0.0–0.1)
Basophils Relative: 0 %
EOS PCT: 2 %
Eosinophils Absolute: 0.2 10*3/uL (ref 0.0–0.7)
HCT: 36.3 % (ref 36.0–46.0)
Hemoglobin: 11.8 g/dL — ABNORMAL LOW (ref 12.0–15.0)
LYMPHS ABS: 0.6 10*3/uL — AB (ref 0.7–4.0)
LYMPHS PCT: 5 %
MCH: 31.1 pg (ref 26.0–34.0)
MCHC: 32.5 g/dL (ref 30.0–36.0)
MCV: 95.5 fL (ref 78.0–100.0)
MONO ABS: 1.1 10*3/uL — AB (ref 0.1–1.0)
Monocytes Relative: 10 %
Neutro Abs: 9.2 10*3/uL — ABNORMAL HIGH (ref 1.7–7.7)
Neutrophils Relative %: 83 %
PLATELETS: 125 10*3/uL — AB (ref 150–400)
RBC: 3.8 MIL/uL — ABNORMAL LOW (ref 3.87–5.11)
RDW: 17.9 % — AB (ref 11.5–15.5)
WBC: 11 10*3/uL — ABNORMAL HIGH (ref 4.0–10.5)

## 2015-03-14 LAB — BRAIN NATRIURETIC PEPTIDE: B Natriuretic Peptide: 76.4 pg/mL (ref 0.0–100.0)

## 2015-03-14 MED ORDER — HEPARIN SOD (PORK) LOCK FLUSH 100 UNIT/ML IV SOLN
500.0000 [IU] | Freq: Once | INTRAVENOUS | Status: AC
Start: 2015-03-14 — End: 2015-03-14
  Administered 2015-03-14: 500 [IU]
  Filled 2015-03-14: qty 5

## 2015-03-14 MED ORDER — FUROSEMIDE 20 MG PO TABS: 20.0000 mg | ORAL_TABLET | Freq: Every day | ORAL | Status: DC

## 2015-03-14 MED ORDER — FUROSEMIDE 10 MG/ML IJ SOLN
40.0000 mg | Freq: Once | INTRAMUSCULAR | Status: AC
  Administered 2015-03-14: 40 mg via INTRAVENOUS
  Filled 2015-03-14: qty 4

## 2015-03-14 NOTE — ED Notes (Signed)
She states she is a cancer pt. (most recent chemo last Wed.) who was d/c'd. From hospital yesterday--hospitalized for sepsis.  She returns today with c/o generalized swelling; especially of bilat. L.e.  She is in no distress.  I do not send her back to our w.r.

## 2015-03-14 NOTE — ED Notes (Signed)
Pt released from hospital yesterday,  Says she was to receive prescription for Lasix but it was not given to her,  Now she has swelling all over,denies pain

## 2015-03-14 NOTE — Discharge Instructions (Signed)

## 2015-03-14 NOTE — ED Provider Notes (Signed)
CSN: [id number]     Arrival date & time 03/14/15  1811 History   First MD Initiated Contact with Patient 03/14/15 1900     Chief Complaint  Patient presents with  . Edema     (Consider location/radiation/quality/duration/timing/severity/associated sxs/prior Treatment) Patient is a 64 y.o. female presenting with general illness. The history is provided by the patient.  Illness Severity:  Moderate Onset quality:  Gradual Duration:  1 day Timing:  Constant Progression:  Unchanged Chronicity:  New Associated symptoms: no chest pain, no congestion, no fever, no headaches, no myalgias, no nausea, no rhinorrhea, no shortness of breath, no vomiting and no wheezing     64 yo F with a chief complaints of generalized swelling. The started when she was in the hospital for severe sepsis. Patient states she was discharged with some mild improvement but continues to have significant swelling. Patient denies chest pain shortness of breath fevers chills. Patient states that she has been urinating without difficulty. Denies flank pain. Denies diarrhea.  Past Medical History  Diagnosis Date  . PONV (postoperative nausea and vomiting)   . Hypertension   . Hypothyroidism   . Arthritis   . Gout   . Neuromuscular disorder ([place])   . Hyperlipidemia   . Family history of anesthesia complication     '" MY OLDEST SON [place] ME "  . 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 Diverticulosis of large intestine without hemorrhage 03/05/2014   Past Surgical History  Procedure Laterality Date  . Cervical fusion  '99 & '03  . Rotator cuff surgery  '08    right  . Cataract extraction w/phaco  12/13/2010    Procedure: CATARACT EXTRACTION PHACO AND INTRAOCULAR LENS PLACEMENT (IOC);  Surgeon: Tonny Branch;  Location: [facility];  Service: Ophthalmology;  Laterality: Right;  CDE: 8.94  . Eye surgery    . Back surgery      2009  . Back surgery  2011    lumbar surgery  . Tubal ligation  1979  . Rotator cuff  repair Left 04/09/2013    DR Marlou Sa  . Shoulder arthroscopy with subacromial decompression and open rotator c Left 04/09/2013    Procedure: SHOULDER ARTHROSCOPY WITH SUBACROMIAL DECOMPRESSION AND MINI OPEN ROTATOR CUFF REPAIR, POSSIBLE BICEP TENDONODESIS,  ;  Surgeon: Meredith Pel, MD;  Location: [facility];  Service: Orthopedics;  Laterality: Left;   Family History  Problem Relation Age of Onset  . Anesthesia problems Neg Hx   . Hypotension Neg Hx   . Malignant hyperthermia Neg Hx   . Pseudochol deficiency Neg Hx   . Diabetes Sister   . Stroke Mother   . Heart disease Father   . Stroke Father   . Diabetes Brother   . Diabetes Sister   . Diabetes Sister   . Diabetes Sister    Social History  Substance Use Topics  . Smoking status: Never Smoker   . Smokeless tobacco: Never Used  . Alcohol Use: No   OB History    No data available     Review of Systems  Constitutional: Negative for fever and chills.  HENT: Negative for congestion and rhinorrhea.   Eyes: Negative for redness and visual disturbance.  Respiratory: Negative for shortness of breath and wheezing.   Cardiovascular: Positive for leg swelling. Negative for chest pain and palpitations.  Gastrointestinal: Negative for nausea and vomiting.  Genitourinary: Negative for dysuria and urgency.  Musculoskeletal: Negative for myalgias and arthralgias.  Skin: Negative for pallor  and wound.  Neurological: Negative for dizziness and headaches.      Allergies  Livalo; Rosuvastatin calcium; Simvastatin; and Statins  Home Medications   Prior to Admission medications   Medication Sig Start Date End Date Taking? Authorizing Provider  allopurinol (ZYLOPRIM) 300 MG tablet TAKE 1 TABLET (300 MG TOTAL) BY MOUTH DAILY. 12/16/14  Yes [name] Hassell Done, FNP  amoxicillin-clavulanate (AUGMENTIN) 875-125 MG tablet Take 1 tablet by mouth 2 (two) times daily. 03/13/15  Yes Verlee Monte, MD  bifidobacterium infantis (ALIGN) capsule Take  1 capsule by mouth daily.   Yes Historical Provider, MD  dexamethasone (DECADRON) 0.1 % ophthalmic solution Place 1 drop into both eyes every 4 (four) hours. 03/13/15  Yes Verlee Monte, MD  dexamethasone (DECADRON) 0.5 MG/5ML solution Take 5 cc BID. Patient taking differently: Take 0.5 mg by mouth 2 (two) times daily.  02/24/15  Yes Wyatt Portela, MD  lidocaine-prilocaine (EMLA) cream Apply 1 application topically as needed. Apply cream, on days of treatment, to [facility] site 1-2 hours prior to treatment. 08/27/13  Yes Wyatt Portela, MD  morphine (MSIR) 15 MG tablet Take 1 tablet (15 mg total) by mouth every 4 (four) hours as needed for severe pain. 02/24/15  Yes Wyatt Portela, MD  ondansetron (ZOFRAN) 8 MG tablet Take 1 tablet (8 mg total) by mouth every 8 (eight) hours as needed for nausea or vomiting. 02/24/15  Yes Wyatt Portela, MD  polyethylene glycol (MIRALAX / GLYCOLAX) packet Take 17 g by mouth daily as needed for mild constipation.   Yes Historical Provider, MD  potassium chloride SA (K-DUR,KLOR-CON) 20 MEQ tablet Take 1 tablet (20 mEq total) by mouth 2 (two) times daily. 03/04/15  Yes Wyatt Portela, MD  prochlorperazine (COMPAZINE) 10 MG tablet Take 1 tablet (10 mg total) by mouth every 6 (six) hours as needed for nausea or vomiting. 07/03/14  Yes Adrena E Johnson, PA-C  SYNTHROID 88 MCG tablet TAKE 1 TABLET (88 MCG TOTAL) BY MOUTH DAILY BEFORE BREAKFAST. 02/26/15  Yes Fransisca Kaufmann Dettinger, MD  temazepam (RESTORIL) 15 MG capsule Take 1 capsule (15 mg total) by mouth at bedtime as needed for sleep. 03/04/15  Yes Wyatt Portela, MD  trimethoprim-polymyxin b (POLYTRIM) ophthalmic solution Place 1 drop into both eyes every 4 (four) hours. 03/13/15  Yes Verlee Monte, MD  albuterol (PROVENTIL HFA;VENTOLIN HFA) 108 (90 BASE) MCG/ACT inhaler Inhale 2 puffs into the lungs every 6 (six) hours as needed for wheezing or shortness of breath. Patient not taking: Reported on 03/10/2015 01/14/15   Fransisca Kaufmann  Dettinger, MD  fluconazole (DIFLUCAN) 100 MG tablet Take 1 tablet (100 mg total) by mouth daily. Patient not taking: Reported on 03/10/2015 03/04/15   Susanne Borders, NP  furosemide (LASIX) 20 MG tablet Take 1 tablet (20 mg total) by mouth daily. 03/14/15   Deno Etienne, DO  morphine 10 MG/5ML solution Take 2.5-5 mLs (5-10 mg total) by mouth every 4 (four) hours as needed for severe pain. Patient not taking: Reported on 03/10/2015 02/21/15   Venetia Maxon Rama, MD  ondansetron (ZOFRAN) 8 MG tablet Take 1 tablet (8 mg total) by mouth every 8 (eight) hours as needed for nausea or vomiting. Patient not taking: Reported on 03/10/2015 02/24/15   Wyatt Portela, MD  sucralfate (CARAFATE) 1 G tablet Take 1 tablet (1 g total) by mouth 4 (four) times daily -  with meals and at bedtime. Patient not taking: Reported on 03/10/2015 03/04/15   Derek Jack  Berniece Salines, NP   BP 146/68 mmHg  Pulse 82  Temp(Src) 98.5 F (36.9 C) (Oral)  Resp 20  SpO2 95% Physical Exam  Constitutional: She is oriented to person, place, and time. She appears well-developed and well-nourished. No distress.  HENT:  Head: Normocephalic and atraumatic.  Eyes: EOM are normal. Pupils are equal, round, and reactive to light.  Neck: Normal range of motion. Neck supple.  Cardiovascular: Normal rate and regular rhythm.  Exam reveals no gallop and no friction rub.   No murmur heard. Pulmonary/Chest: Effort normal. She has no wheezes. She has no rales.  Abdominal: Soft. She exhibits no distension. There is no tenderness. There is no rebound and no guarding.  Musculoskeletal: She exhibits edema (generalized, 3+ up to thighs and forearms). She exhibits no tenderness.  Neurological: She is alert and oriented to person, place, and time.  Skin: Skin is warm and dry. She is not diaphoretic.  Psychiatric: She has a normal mood and affect. Her behavior is normal.  Nursing note and vitals reviewed.   ED Course  Procedures (including critical care  time) Labs Review Labs Reviewed  CBC WITH DIFFERENTIAL/PLATELET - Abnormal; Notable for the following:    WBC 11.0 (*)    RBC 3.80 (*)    Hemoglobin 11.8 (*)    RDW 17.9 (*)    Platelets 125 (*)    Neutro Abs 9.2 (*)    Lymphs Abs 0.6 (*)    Monocytes Absolute 1.1 (*)    All other components within normal limits  COMPREHENSIVE METABOLIC PANEL - Abnormal; Notable for the following:    CO2 21 (*)    Creatinine, Ser 0.40 (*)    Calcium 7.2 (*)    Total Protein 4.4 (*)    Albumin 2.0 (*)    All other components within normal limits  URINALYSIS, ROUTINE W REFLEX MICROSCOPIC (NOT AT [facility]) - Abnormal; Notable for the following:    Hgb urine dipstick SMALL (*)    All other components within normal limits  URINE MICROSCOPIC-ADD ON - Abnormal; Notable for the following:    Squamous Epithelial / LPF FEW (*)    All other components within normal limits  BRAIN NATRIURETIC PEPTIDE    Imaging Review Dg Chest 2 View  03/14/2015  CLINICAL DATA:  Acute onset of bilateral lower extremity swelling. Recently hospitalized for sepsis. Initial encounter. EXAM: CHEST  2 VIEW COMPARISON:  Chest radiograph performed 03/10/2015 FINDINGS: Mild bibasilar opacities may reflect minimal interstitial edema or atelectasis. Small bilateral pleural effusions are noted on the lateral view. No pneumothorax is seen. The cardiomediastinal silhouette remains normal in size. A right-sided chest port is noted ending about the mid to distal SVC. No acute osseous abnormalities are seen. Cervical spinal fusion hardware is noted. IMPRESSION: Mild bibasilar airspace opacities may reflect minimal interstitial edema or atelectasis. Small bilateral pleural effusions noted. Electronically Signed   By: Garald Balding M.D.   On: 03/14/2015 19:24   I have personally reviewed and evaluated these images and lab results as part of my medical decision-making.   EKG Interpretation None      MDM   Final diagnoses:  Edema of  extremities    64 yo F with a chief complaints of edema. Appears to be likely fluid overload from resuscitation. No acute renal, hepatic or cardiac issues identified. Feel likely secondary to the resuscitation. We'll give 1 dose of IV Lasix here. Start on 20 daily. PCP follow-up on Monday for electrolyte check.  12:13 AM:  I have discussed the diagnosis/risks/treatment options with the patient and family and believe the pt to be eligible for discharge home to follow-up with PCP. We also discussed returning to the ED immediately if new or worsening sx occur. We discussed the sx which are most concerning (e.g., sudden worsening sob, chest pain, fever) that necessitate immediate return. Medications administered to the patient during their visit and any new prescriptions provided to the patient are listed below.  Medications given during this visit Medications  furosemide (LASIX) injection 40 mg (40 mg Intravenous Given 03/14/15 2102)  heparin lock flush 100 unit/mL (500 Units Intracatheter Given 03/14/15 2151)    Discharge Medication List as of 03/14/2015  8:53 PM    START taking these medications   Details  furosemide (LASIX) 20 MG tablet Take 1 tablet (20 mg total) by mouth daily., Starting 03/14/2015, Until Discontinued, Print        The patient appears reasonably screen and/or stabilized for discharge and I doubt any other medical condition or other [facility] requiring further screening, evaluation, or treatment in the ED at this time prior to discharge.      Deno Etienne, DO 03/15/15 [id number]

## 2015-03-15 LAB — CULTURE, BLOOD (ROUTINE X 2)
CULTURE: NO GROWTH
CULTURE: NO GROWTH

## 2015-03-18 ENCOUNTER — Other Ambulatory Visit: Payer: Self-pay | Admitting: Nurse Practitioner

## 2015-03-18 ENCOUNTER — Ambulatory Visit (HOSPITAL_BASED_OUTPATIENT_CLINIC_OR_DEPARTMENT_OTHER): Payer: Medicare Other

## 2015-03-18 ENCOUNTER — Encounter: Payer: Self-pay | Admitting: Radiation Oncology

## 2015-03-18 ENCOUNTER — Ambulatory Visit (HOSPITAL_BASED_OUTPATIENT_CLINIC_OR_DEPARTMENT_OTHER): Payer: Medicare Other | Admitting: Nurse Practitioner

## 2015-03-18 ENCOUNTER — Other Ambulatory Visit (HOSPITAL_BASED_OUTPATIENT_CLINIC_OR_DEPARTMENT_OTHER): Payer: Medicare Other

## 2015-03-18 VITALS — BP 121/50 | HR 87 | Temp 97.4°F | Resp 17

## 2015-03-18 DIAGNOSIS — C221 Intrahepatic bile duct carcinoma: Secondary | ICD-10-CM

## 2015-03-18 DIAGNOSIS — Z5111 Encounter for antineoplastic chemotherapy: Secondary | ICD-10-CM

## 2015-03-18 LAB — COMPREHENSIVE METABOLIC PANEL (CC13)
ALBUMIN: 2.4 g/dL — AB (ref 3.5–5.0)
ALK PHOS: 201 U/L — AB (ref 40–150)
ALT: 28 U/L (ref 0–55)
AST: 39 U/L — ABNORMAL HIGH (ref 5–34)
Anion Gap: 13 mEq/L — ABNORMAL HIGH (ref 3–11)
BILIRUBIN TOTAL: 0.74 mg/dL (ref 0.20–1.20)
BUN: 20.1 mg/dL (ref 7.0–26.0)
CALCIUM: 9.6 mg/dL (ref 8.4–10.4)
CO2: 22 mEq/L (ref 22–29)
Chloride: 103 mEq/L (ref 98–109)
Creatinine: 0.7 mg/dL (ref 0.6–1.1)
GLUCOSE: 117 mg/dL (ref 70–140)
Potassium: 4.2 mEq/L (ref 3.5–5.1)
SODIUM: 138 meq/L (ref 136–145)
TOTAL PROTEIN: 5.7 g/dL — AB (ref 6.4–8.3)

## 2015-03-18 LAB — CBC WITH DIFFERENTIAL/PLATELET
BASO%: 0.4 % (ref 0.0–2.0)
BASOS ABS: 0 10*3/uL (ref 0.0–0.1)
EOS%: 0.2 % (ref 0.0–7.0)
Eosinophils Absolute: 0 10*3/uL (ref 0.0–0.5)
HEMATOCRIT: 37.8 % (ref 34.8–46.6)
HEMOGLOBIN: 12.4 g/dL (ref 11.6–15.9)
LYMPH#: 0.6 10*3/uL — AB (ref 0.9–3.3)
LYMPH%: 9 % — ABNORMAL LOW (ref 14.0–49.7)
MCH: 31.6 pg (ref 25.1–34.0)
MCHC: 32.8 g/dL (ref 31.5–36.0)
MCV: 96.5 fL (ref 79.5–101.0)
MONO#: 0.6 10*3/uL (ref 0.1–0.9)
MONO%: 8.1 % (ref 0.0–14.0)
NEUT%: 82.3 % — ABNORMAL HIGH (ref 38.4–76.8)
NEUTROS ABS: 5.9 10*3/uL (ref 1.5–6.5)
PLATELETS: 252 10*3/uL (ref 145–400)
RBC: 3.91 10*6/uL (ref 3.70–5.45)
RDW: 19 % — AB (ref 11.2–14.5)
WBC: 7.2 10*3/uL (ref 3.9–10.3)

## 2015-03-18 MED ORDER — HEPARIN SOD (PORK) LOCK FLUSH 100 UNIT/ML IV SOLN
500.0000 [IU] | Freq: Once | INTRAVENOUS | Status: DC | PRN
  Filled 2015-03-18: qty 5

## 2015-03-18 MED ORDER — FLUOROURACIL CHEMO INJECTION 5 GM/100ML
2400.0000 mg/m2 | INTRAVENOUS | Status: DC
  Administered 2015-03-18: 4500 mg via INTRAVENOUS
  Filled 2015-03-18: qty 90

## 2015-03-18 MED ORDER — DEXTROSE 5 % IV SOLN
400.0000 mg/m2 | Freq: Once | INTRAVENOUS | Status: AC
  Administered 2015-03-18: 748 mg via INTRAVENOUS
  Filled 2015-03-18: qty 37.4

## 2015-03-18 MED ORDER — OXALIPLATIN CHEMO INJECTION 100 MG/20ML
60.0000 mg/m2 | Freq: Once | INTRAVENOUS | Status: AC
  Administered 2015-03-18: 110 mg via INTRAVENOUS
  Filled 2015-03-18: qty 22

## 2015-03-18 MED ORDER — DEXAMETHASONE SODIUM PHOSPHATE 100 MG/10ML IJ SOLN
Freq: Once | INTRAMUSCULAR | Status: AC
  Administered 2015-03-18: 12:00:00 via INTRAVENOUS
  Filled 2015-03-18: qty 4

## 2015-03-18 MED ORDER — DEXTROSE 5 % IV SOLN
Freq: Once | INTRAVENOUS | Status: AC
  Administered 2015-03-18: 12:00:00 via INTRAVENOUS

## 2015-03-18 MED ORDER — FLUOROURACIL CHEMO INJECTION 2.5 GM/50ML
400.0000 mg/m2 | Freq: Once | INTRAVENOUS | Status: AC
  Administered 2015-03-18: 750 mg via INTRAVENOUS
  Filled 2015-03-18: qty 15

## 2015-03-18 MED ORDER — SODIUM CHLORIDE 0.9 % IJ SOLN
10.0000 mL | INTRAMUSCULAR | Status: DC | PRN
  Filled 2015-03-18: qty 10

## 2015-03-18 MED ORDER — HYDROCODONE-HOMATROPINE 5-1.5 MG/5ML PO SYRP: 5.0000 mL | ORAL_SOLUTION | Freq: Four times a day (QID) | ORAL | Status: DC | PRN

## 2015-03-18 NOTE — Patient Instructions (Signed)
[facility] Discharge Instructions for Patients Receiving Chemotherapy  Today you received the following chemotherapy agents Oxaliplatin, Leucovorin, and 5 FU  To help prevent nausea and vomiting after your treatment, we encourage you to take your nausea medication as directed If you develop nausea and vomiting that is not controlled by your nausea medication, call the clinic.   BELOW ARE SYMPTOMS THAT SHOULD BE REPORTED IMMEDIATELY:  *FEVER GREATER THAN 100.5 F  *CHILLS WITH OR WITHOUT FEVER  NAUSEA AND VOMITING THAT IS NOT CONTROLLED WITH YOUR NAUSEA MEDICATION  *UNUSUAL SHORTNESS OF BREATH  *UNUSUAL BRUISING OR BLEEDING  TENDERNESS IN MOUTH AND THROAT WITH OR WITHOUT PRESENCE OF ULCERS  *URINARY PROBLEMS  *BOWEL PROBLEMS  UNUSUAL RASH Items with * indicate a potential emergency and should be followed up as soon as possible.  Feel free to call the clinic you have any questions or concerns. The clinic phone number is [phone].  Please show the CHEMO ALERT CARD at check-in to the Emergency Department and triage nurse.   

## 2015-03-20 ENCOUNTER — Ambulatory Visit
Admission: RE | Admit: 2015-03-20 | Discharge: 2015-03-20 | Disposition: A | Discharge status: Home / Self Care | Payer: Medicare Other | Source: Ambulatory Visit | Attending: Radiation Oncology | Admitting: Radiation Oncology

## 2015-03-20 ENCOUNTER — Encounter: Payer: Self-pay | Admitting: Radiation Oncology

## 2015-03-20 ENCOUNTER — Ambulatory Visit (HOSPITAL_BASED_OUTPATIENT_CLINIC_OR_DEPARTMENT_OTHER): Payer: Medicare Other

## 2015-03-20 ENCOUNTER — Encounter: Payer: Self-pay | Admitting: Nurse Practitioner

## 2015-03-20 VITALS — BP 115/60 | HR 91 | Temp 97.7°F | Resp 18

## 2015-03-20 VITALS — BP 99/84 | HR 96 | Temp 98.4°F | Resp 20 | Ht 65.5 in | Wt 169.8 lb

## 2015-03-20 DIAGNOSIS — Z452 Encounter for adjustment and management of vascular access device: Secondary | ICD-10-CM

## 2015-03-20 DIAGNOSIS — C221 Intrahepatic bile duct carcinoma: Secondary | ICD-10-CM

## 2015-03-20 DIAGNOSIS — C7951 Secondary malignant neoplasm of bone: Secondary | ICD-10-CM

## 2015-03-20 HISTORY — DX: Personal history of irradiation: Z92.3

## 2015-03-20 MED ORDER — SODIUM CHLORIDE 0.9 % IJ SOLN
10.0000 mL | INTRAMUSCULAR | Status: DC | PRN
  Administered 2015-03-20: 10 mL
  Filled 2015-03-20: qty 10

## 2015-03-20 MED ORDER — HEPARIN SOD (PORK) LOCK FLUSH 100 UNIT/ML IV SOLN
500.0000 [IU] | Freq: Once | INTRAVENOUS | Status: AC | PRN
  Administered 2015-03-20: 500 [IU]
  Filled 2015-03-20: qty 5

## 2015-03-20 NOTE — Progress Notes (Signed)
Radiation Oncology         [phone] ________________________________  Name: Lindsay Aguirre MRN: [id number]  Date: 03/20/2015  DOB: 1950/12/05  Follow-Up Visit Note  CC: Chevis Pretty, FNP  Wyatt Portela, MD  Diagnosis: Metastatic cholangiocarcinoma.  Interval Since Last Radiation: 30 Gy in 10 fractions was completed 02/11/2015.  Narrative:  The patient returns today for routine follow-up. She has been in the hospital since the last visit and was discharged 03/15/15. She has a sharp pain in her throat sometimes. When she eats softer food, it helps with that pain. She has some radiating pain in her back. She mentions that her pain pain is a 3/10. She is using Carafate to help with the pain. She is eating better and is continuing to eat soft foods. She denies nausea.  ALLERGIES:  is allergic to livalo; rosuvastatin calcium; simvastatin; and statins.  Meds: Current Outpatient Prescriptions  Medication Sig Dispense Refill  . allopurinol (ZYLOPRIM) 300 MG tablet TAKE 1 TABLET (300 MG TOTAL) BY MOUTH DAILY. 30 tablet 2  . dexamethasone (DECADRON) 0.5 MG/5ML solution Take 5 cc BID. (Patient taking differently: Take 0.5 mg by mouth 2 (two) times daily. ) 500 mL 0  . fluconazole (DIFLUCAN) 100 MG tablet Take 1 tablet (100 mg total) by mouth daily. 21 tablet 0  . furosemide (LASIX) 20 MG tablet Take 1 tablet (20 mg total) by mouth daily. 5 tablet 0  . HYDROcodone-homatropine (HYCODAN) 5-1.5 MG/5ML syrup Take 5 mLs by mouth every 6 (six) hours as needed for cough. 120 mL 0  . morphine (MSIR) 15 MG tablet Take 1 tablet (15 mg total) by mouth every 4 (four) hours as needed for severe pain. 30 tablet 0  . polyethylene glycol (MIRALAX / GLYCOLAX) packet Take 17 g by mouth daily as needed for mild constipation.    . potassium chloride SA (K-DUR,KLOR-CON) 20 MEQ tablet Take 1 tablet (20 mEq total) by mouth 2 (two) times daily. 60 tablet 0  . sucralfate (CARAFATE) 1 G tablet Take 1  tablet (1 g total) by mouth 4 (four) times daily -  with meals and at bedtime. 50 tablet 1  . SYNTHROID 88 MCG tablet TAKE 1 TABLET (88 MCG TOTAL) BY MOUTH DAILY BEFORE BREAKFAST. 30 tablet 0  . temazepam (RESTORIL) 15 MG capsule Take 1 capsule (15 mg total) by mouth at bedtime as needed for sleep. 30 capsule 0  . albuterol (PROVENTIL HFA;VENTOLIN HFA) 108 (90 BASE) MCG/ACT inhaler Inhale 2 puffs into the lungs every 6 (six) hours as needed for wheezing or shortness of breath. (Patient not taking: Reported on 03/10/2015) 1 Inhaler 0  . amoxicillin-clavulanate (AUGMENTIN) 875-125 MG tablet Take 1 tablet by mouth 2 (two) times daily. (Patient not taking: Reported on 03/20/2015) 14 tablet 0  . bifidobacterium infantis (ALIGN) capsule Take 1 capsule by mouth daily.    [name] dexamethasone (DECADRON) 0.1 % ophthalmic solution Place 1 drop into both eyes every 4 (four) hours. (Patient not taking: Reported on 03/20/2015) 5 mL 0  . lidocaine-prilocaine (EMLA) cream Apply 1 application topically as needed. Apply cream, on days of treatment, to [facility] site 1-2 hours prior to treatment. 30 g 0  . morphine 10 MG/5ML solution Take 2.5-5 mLs (5-10 mg total) by mouth every 4 (four) hours as needed for severe pain. (Patient not taking: Reported on 03/10/2015) 15 mL 0  . ondansetron (ZOFRAN) 8 MG tablet Take 1 tablet (8 mg total) by mouth every 8 (eight) hours as needed  for nausea or vomiting. (Patient not taking: Reported on 03/10/2015) 30 tablet 1  . ondansetron (ZOFRAN) 8 MG tablet Take 1 tablet (8 mg total) by mouth every 8 (eight) hours as needed for nausea or vomiting. (Patient not taking: Reported on 03/20/2015) 30 tablet 1  . prochlorperazine (COMPAZINE) 10 MG tablet Take 1 tablet (10 mg total) by mouth every 6 (six) hours as needed for nausea or vomiting. (Patient not taking: Reported on 03/20/2015) 30 tablet 1  . trimethoprim-polymyxin b (POLYTRIM) ophthalmic solution Place 1 drop into both eyes every 4 (four) hours.  (Patient not taking: Reported on 03/20/2015) 10 mL 0   No current facility-administered medications for this encounter.   Facility-Administered Medications Ordered in Other Encounters  Medication Dose Route Frequency Provider Last Rate Last Dose  . sodium chloride 0.9 % injection 10 mL  10 mL Intracatheter PRN Wyatt Portela, MD   10 mL at 03/20/15 1342    Physical Findings: The patient is in no acute distress. Patient is alert and oriented.  height is 5' 5.5" (1.664 m) and weight is 169 lb 12.8 oz (77.021 kg). Her oral temperature is 98.4 F (36.9 C). Her blood pressure is 99/84 and her pulse is 96. Her respiration is 20 and oxygen saturation is 96%. .     Lab Findings: Lab Results  Component Value Date   WBC 7.2 03/18/2015   HGB 12.4 03/18/2015   HCT 37.8 03/18/2015   MCV 96.5 03/18/2015   PLT 252 03/18/2015     Radiographic Findings: Dg Chest 2 View  03/14/2015  CLINICAL DATA:  Acute onset of bilateral lower extremity swelling. Recently hospitalized for sepsis. Initial encounter. EXAM: CHEST  2 VIEW COMPARISON:  Chest radiograph performed 03/10/2015 FINDINGS: Mild bibasilar opacities may reflect minimal interstitial edema or atelectasis. Small bilateral pleural effusions are noted on the lateral view. No pneumothorax is seen. The cardiomediastinal silhouette remains normal in size. A right-sided chest port is noted ending about the mid to distal SVC. No acute osseous abnormalities are seen. Cervical spinal fusion hardware is noted. IMPRESSION: Mild bibasilar airspace opacities may reflect minimal interstitial edema or atelectasis. Small bilateral pleural effusions noted. Electronically Signed   By: Garald Balding M.D.   On: 03/14/2015 19:24   Dg Chest 2 View  03/10/2015  CLINICAL DATA:  Cough, congestion and shortness of breath. History of colon cancer and chemotherapy. EXAM: CHEST  2 VIEW COMPARISON:  01/14/2015 and chest CT 02/17/2015 FINDINGS: There is a right Port-A-Cath.  Catheter tip near the superior cavoatrial junction. Again noted is surgical hardware in the cervical and upper thoracic spine. Slightly low lung volumes with patchy basilar densities. There is vascular fullness in the hilar regions. Heart size is normal. Negative for a pneumothorax. Trachea is midline. There is blunting at the left costophrenic angle and the lateral view demonstrates a small pleural effusion. Air-fluid level in the stomach. IMPRESSION: Slightly low lung volumes with vascular crowding in the hilar regions. Cannot exclude vascular congestion. Few patchy densities at the lung bases could represent atelectasis and suspect a new small left pleural effusion. Electronically Signed   By: Markus Daft M.D.   On: 03/10/2015 12:15   Ct Head Wo Contrast  03/10/2015  CLINICAL DATA:  Generalized weakness and dizziness today. EXAM: CT HEAD WITHOUT CONTRAST TECHNIQUE: Contiguous axial images were obtained from the base of the skull through the vertex without intravenous contrast. COMPARISON:  None. FINDINGS: The ventricles are normal in size and configuration. No extra-axial fluid  collections are identified. The gray-white differentiation is normal. No CT findings for acute intracranial process such as hemorrhage or infarction. No mass lesions. The brainstem and cerebellum are grossly normal. The bony structures are intact. The paranasal sinuses and mastoid air cells are clear. The globes are intact. IMPRESSION: Normal head CT. Electronically Signed   By: Marijo Sanes M.D.   On: 03/10/2015 21:56   Ct Soft Tissue Neck W Contrast  02/19/2015  CLINICAL DATA:  Neck pain since spine surgery in March. Cholangiocarcinoma. EXAM: CT NECK WITH CONTRAST TECHNIQUE: Multidetector CT imaging of the neck was performed using the standard protocol following the bolus administration of intravenous contrast. CONTRAST:  55mL OMNIPAQUE IOHEXOL 300 MG/ML  SOLN COMPARISON:  Cervical spine MRI 12/05/2014 FINDINGS: Pharynx and  larynx: No asymmetric mass or inflammatory enhancement. Mild lower pharyngeal and laryngeal edema suspected from radiation. Salivary glands: Mild edema within the submandibular glands, likely radiation related. Thyroid: Negative Lymph nodes: No enlarged or necrotic appearing lymph nodes. Vascular: Major vessels are patent Limited intracranial: Minimal imaging is negative for metastatic disease. Visualized orbits: Negative Mastoids and visualized paranasal sinuses: Negative Skeleton: New metastasis within the posterior aspect of the T3 vertebral body. The lesion erodes the dorsal cortex without gross extra-spinal spread. Extensive posterior element metastatic disease involving the fused C6-7 levels has been treated with decompressive laminectomy from C4-T1 and presumed facetectomy. There is pre-existing C5-6 and C6-7 discectomies with complete bony fusion. Despite the surgery, there is progressive destruction of the bilateral C7 articular process with larger bilateral vertebral body erosion. The C6-7 foramina are compromised by tumor; assessment of the canal is not possible due to extensive streak artifact from hardware which now extends posteriorly from C3-T2. No evidence of hardware fracture or displacement. Upper chest: Thickened appearance of the upper esophagus with prominent mucosal enhancement, likely radiation related if performed. No apical pulmonary nodules. IMPRESSION: 1. Progressive C6 and C7 metastasis with bilateral C6-7 foramina involvement. Cervical canal cannot be evaluated due to CT limitations and hardware artifact. 2. New T3 vertebral body metastasis. 3. Mild inflammation in the visceral neck correlating with radiation therapy. Electronically Signed   By: Monte Fantasia M.D.   On: 02/19/2015 15:07   Ct Abdomen Pelvis W Contrast  03/10/2015  CLINICAL DATA:  Weakness. History of colon carcinoma, post chemotherapy EXAM: CT ABDOMEN AND PELVIS WITH CONTRAST TECHNIQUE: Multidetector CT imaging of  the abdomen and pelvis was performed using the standard protocol following bolus administration of intravenous contrast. CONTRAST:  60mL OMNIPAQUE IOHEXOL 300 MG/ML SOLN, 179mL OMNIPAQUE IOHEXOL 300 MG/ML SOLN COMPARISON:  02/17/2015 FINDINGS: New small bilateral pleural effusions with dependent atelectasis in the visualized lung bases. Interval decrease in pericardial effusion. Multiple large and confluent low-attenuation metastatic foci in the liver, nearly confluent throughout much of the left lobe and segment 4. A small amount of abdominal and pelvic ascites has increased. New wedge-shaped perfusion defects in the spleen suggesting infarcts. Splenic vein is patent but there are multiple retroperitoneal collateral channels to the left renal vein as before. Unremarkable adrenal glands, kidneys, pancreas, gallbladder. No hydronephrosis. The stomach is non dilated. Small bowel and colon are nondilated. Fecal material distends the rectum. Urinary bladder physiologically distended. Intermediate attenuation left ovarian cystic lesion as before. No adenopathy. No free air. Mild aortoiliac arterial calcifications. Previous instrumented PLIF L3-L5. Poorly marginated sclerotic focus in T7 vertebral body as before. No new bone lesions identified. IMPRESSION: 1. New small pleural effusions and worsening abdominal ascites. 2. Multiple small splenic infarcts, new since  prior exam. 3. Extensive hepatic metastatic disease as before. Electronically Signed   By: Lucrezia Europe M.D.   On: 03/10/2015 13:36   US Abdomen Limited  03/11/2015  CLINICAL DATA:  Suspected ascites, metastatic cholangiocarcinoma EXAM: LIMITED ABDOMEN ULTRASOUND FOR ASCITES TECHNIQUE: Limited ultrasound survey for ascites was performed in all four abdominal quadrants. COMPARISON:  Abdominal pelvic CT scan of March 10, 2015 FINDINGS: Multiple hepatic masses are observed. A small amount of ascites is present in the left upper quadrant. There is minimal ascites  adjacent to the liver. There is a small right pleural effusion. IMPRESSION: There is ascites present but the volume is insufficient for successful paracentesis. Multiple hepatic masses are observed. Electronically Signed   By: David  Martinique M.D.   On: 03/11/2015 09:02    Impression: Lindsay Aguirre is a 64 yo female with metastatic cholangiocarcinoma. The patient's sore throat is improving.  Plan: She has a follow up appointment scheduled with Dr. Alen Blew 04/01/15. She will follow up with me on an as needed basis.   Jodelle Gross, M.D., Ph.D.    This document serves as a record of services personally performed by Kyung Rudd, MD. It was created on his behalf by  Lendon Collar, a trained medical scribe. The creation of this record is based on the scribe's personal observations and the provider's statements to them. This document has been checked and approved by the attending provider.

## 2015-03-20 NOTE — Assessment & Plan Note (Signed)
Patient received her last cycle of FOLFOX chemotherapy on 03/06/2015.  Since that time.-Patient was admitted to the hospital from 03/10/2015 through 03/13/2015 with complaints of cough, bilateral conjunctivitis, otitis media to the right ear, and dehydration.  She was discharged with Augmentin antibiotics.  However, patient presented once again to the emergency department on 03/14/2015 for what patient describes as fluid overload.  She was discharged from the emergency department with the Lasix prescription.  Patient states she is feeling much better today; and is eager to proceed with her chemotherapy.  She denies any issues with her eyes or her ears.  She denies any recent fevers or chills.  Blood counts obtained today revealed a WBC of 7.2, ANC 5.9, hemoglobin 12.4, and platelet count 252.  Review Dr. Hazeline Junker most recent note-and patient will proceed today with planned cycle 2 of her FOLFOX chemotherapy.  Patient is scheduled to return on 04/01/2015 for labs, visit, and chemotherapy.

## 2015-03-20 NOTE — Progress Notes (Signed)
Follow up s/p radiation 01/29/15-02/11/15, C5-T10, pain 3/10 scale right side mid back, d/c hospital 03/15/15, had Chemothrapy 03/18/15 follow up with Dr. Alen Blew 04/01/15, eating better, has carafte but taking with applesauce crushed, suggested to dissolve it in 1-2 tablespoon water only,  No nausea ,fatigued, walks with walker 2:55 PM BP 99/84 mmHg  Pulse 96  Temp(Src) 98.4 F (36.9 C) (Oral)  Resp 20  Ht 5' 5.5" (1.664 m)  Wt 169 lb 12.8 oz (77.021 kg)  BMI 27.82 kg/m2  SpO2 96%  Wt Readings from Last 3 Encounters:  03/20/15 169 lb 12.8 oz (77.021 kg)  03/13/15 186 lb 4.6 oz (84.5 kg)  03/07/15 178 lb 3.2 oz (80.831 kg)

## 2015-03-20 NOTE — Progress Notes (Signed)
SYMPTOM MANAGEMENT CLINIC   HPI: Lindsay Aguirre 64 y.o. female diagnosed with : GO carcinoma; with bone metastasis.  Patient is status post radiation treatment completed on 02/11/2015.  Patient is currently undergoing FOLFOX chemotherapy regimen.   Patient received her last cycle of FOLFOX chemotherapy on 03/06/2015.  Since that time.-Patient was admitted to the hospital from 03/10/2015 through 03/13/2015 with complaints of cough, bilateral conjunctivitis, otitis media to the right ear, and dehydration.  She was discharged with Augmentin antibiotics.  However, patient presented once again to the emergency department on 03/14/2015 for what patient describes as fluid overload.  She was discharged from the emergency department with the Lasix prescription.  Patient states she is feeling much better today; and is eager to proceed with her chemotherapy.  She denies any issues with her eyes or her ears.  She denies any recent fevers or chills.  Blood counts obtained today revealed a WBC of 7.2, ANC 5.9, hemoglobin 12.4, and platelet count 252.  Review Dr. Hazeline Junker most recent note-and patient will proceed today with planned cycle 2 of her FOLFOX chemotherapy.  Patient is scheduled to return on 04/01/2015 for labs, visit, and chemotherapy.  HPI  ROS  Past Medical History  Diagnosis Date  . PONV (postoperative nausea and vomiting)   . Hypertension   . Hypothyroidism   . Arthritis   . Gout   . Neuromuscular disorder ([place])   . Hyperlipidemia   . Family history of anesthesia complication     '" MY OLDEST SON [place] ME "  . 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 Diverticulosis of large intestine without hemorrhage 03/05/2014  . S/P radiation therapy 01/29/15-02/11/15    C5-T10 30Gy/47f    Past Surgical History  Procedure Laterality Date  . Cervical fusion  '99 & '03  . Rotator cuff surgery  '08    right  . Cataract extraction w/phaco  12/13/2010    Procedure: CATARACT  EXTRACTION PHACO AND INTRAOCULAR LENS PLACEMENT (IOC);  Surgeon: KTonny Branch  Location: [facility];  Service: Ophthalmology;  Laterality: Right;  CDE: 8.94  . Eye surgery    . Back surgery      2009  . Back surgery  2011    lumbar surgery  . Tubal ligation  1979  . Rotator cuff repair Left 04/09/2013    DR DMarlou Sa . 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    is allergic to livalo; rosuvastatin calcium; simvastatin; and statins.    Medication List       This list is accurate as of: 03/18/15 11:59 PM.  Always use your most recent med list.               albuterol 108 (90 BASE) MCG/ACT inhaler  Commonly known as:  PROVENTIL HFA;VENTOLIN HFA  Inhale 2 puffs into the lungs every 6 (six)  hours as needed for wheezing or shortness of breath.     allopurinol 300 MG tablet  Commonly known as:  ZYLOPRIM  TAKE 1 TABLET (300 MG TOTAL) BY MOUTH DAILY.     amoxicillin-clavulanate 875-125 MG tablet  Commonly known as:  AUGMENTIN  Take 1 tablet by mouth 2 (two) times daily.     bifidobacterium infantis capsule  Take 1 capsule by mouth daily.     dexamethasone 0.1 % ophthalmic solution    Commonly known as:  DECADRON  Place 1 drop into both eyes every 4 (four) hours.     dexamethasone 0.5 MG/5ML solution  Commonly known as:  DECADRON  Take 5 cc BID.     fluconazole 100 MG tablet  Commonly known as:  DIFLUCAN  Take 1 tablet (100 mg total) by mouth daily.     furosemide 20 MG tablet  Commonly known as:  LASIX  Take 1 tablet (20 mg total) by mouth daily.     HYDROcodone-homatropine 5-1.5 MG/5ML syrup  Commonly known as:  HYCODAN  Take 5 mLs by mouth every 6 (six) hours as needed for cough.     lidocaine-prilocaine cream  Commonly known as:  EMLA  Apply 1 application topically as needed. Apply cream, on days of treatment, to [facility] site 1-2 hours prior to treatment.     morphine 10 MG/5ML solution  Take 2.5-5 mLs (5-10 mg total) by mouth every 4 (four) hours as needed for severe pain.     morphine 15 MG tablet  Commonly known as:  MSIR  Take 1 tablet (15 mg total) by mouth every 4 (four) hours as needed for severe pain.     ondansetron 8 MG tablet  Commonly known as:  ZOFRAN  Take 1 tablet (8 mg total) by mouth every 8 (eight) hours as needed for nausea or vomiting.     ondansetron 8 MG tablet  Commonly known as:  ZOFRAN  Take 1 tablet (8 mg total) by mouth every 8 (eight) hours as needed for nausea or vomiting.     polyethylene glycol packet  Commonly known as:  MIRALAX / GLYCOLAX  Take 17 g by mouth daily as needed for mild constipation.     potassium chloride SA 20 MEQ tablet  Commonly known as:  K-DUR,KLOR-CON  Take 1 tablet (20 mEq total) by mouth 2 (two) times daily.     prochlorperazine 10 MG tablet  Commonly known as:  COMPAZINE  Take 1 tablet (10 mg total) by mouth every 6 (six) hours as needed for nausea or vomiting.     sucralfate 1 G tablet  Commonly known as:  CARAFATE  Take 1 tablet (1 g total) by mouth 4 (four) times daily -  with meals and at bedtime.     SYNTHROID 88 MCG tablet  Generic drug:  levothyroxine  TAKE 1 TABLET (88 MCG  TOTAL) BY MOUTH DAILY BEFORE BREAKFAST.     temazepam 15 MG capsule  Commonly known as:  RESTORIL  Take 1 capsule (15 mg total) by mouth at bedtime as needed for sleep.     trimethoprim-polymyxin b ophthalmic solution  Commonly known as:  POLYTRIM  Place 1 drop into both eyes every 4 (four) hours.         PHYSICAL EXAMINATION  Oncology Vitals 03/20/2015 03/20/2015  Height 166 cm -  Weight 77.021 kg -  Weight (lbs) 169 lbs 13 oz -  BMI (kg/m2) 27.83 kg/m2 -  Temp 98.4 97.7  Pulse 96  91  Resp 20 18  SpO2 96 97  BSA (m2) 1.89 m2 -   BP Readings from Last 2 Encounters:  03/20/15 99/84  03/20/15 115/60    Physical Exam  Constitutional: She is oriented to person, place, and time.  Patient appears fatigued, slightly weak, and chronically ill.  HENT:  Head: Normocephalic and atraumatic.  Mouth/Throat: Oropharynx is clear and moist.  Patient has a healing ulcer to the right corner of her mouth only.  There are no other oral lesions noted.  Eyes: Conjunctivae and EOM are normal. Pupils are equal, round, and reactive to light. Right eye exhibits no discharge. Left eye exhibits no discharge. No scleral icterus.  Neck: Normal range of motion.  Pulmonary/Chest: Effort normal. No respiratory distress.  Musculoskeletal: Normal range of motion. She exhibits no edema or tenderness.  Neurological: She is alert and oriented to person, place, and time.  Skin: Skin is warm and dry. No rash noted. No erythema. There is pallor.  Psychiatric: Affect normal.  Nursing note and vitals reviewed.   LABORATORY DATA:. Appointment on 03/18/2015  Component Date Value Ref Range Status  . WBC 03/18/2015 7.2  3.9 - 10.3 10e3/uL Final  . NEUT# 03/18/2015 5.9  1.5 - 6.5 10e3/uL Final  . HGB 03/18/2015 12.4  11.6 - 15.9 g/dL Final  . HCT 03/18/2015 37.8  34.8 - 46.6 % Final  . Platelets 03/18/2015 252  145 - 400 10e3/uL Final  . MCV 03/18/2015 96.5  79.5 - 101.0 fL Final  . MCH 03/18/2015 31.6  25.1  - 34.0 pg Final  . MCHC 03/18/2015 32.8  31.5 - 36.0 g/dL Final  . RBC 03/18/2015 3.91  3.70 - 5.45 10e6/uL Final  . RDW 03/18/2015 19.0* 11.2 - 14.5 % Final  . lymph# 03/18/2015 0.6* 0.9 - 3.3 10e3/uL Final  . MONO# 03/18/2015 0.6  0.1 - 0.9 10e3/uL Final  . Eosinophils Absolute 03/18/2015 0.0  0.0 - 0.5 10e3/uL Final  . Basophils Absolute 03/18/2015 0.0  0.0 - 0.1 10e3/uL Final  . NEUT% 03/18/2015 82.3* 38.4 - 76.8 % Final  . LYMPH% 03/18/2015 9.0* 14.0 - 49.7 % Final  . MONO% 03/18/2015 8.1  0.0 - 14.0 % Final  . EOS% 03/18/2015 0.2  0.0 - 7.0 % Final  . BASO% 03/18/2015 0.4  0.0 - 2.0 % Final  . Sodium 03/18/2015 138  136 - 145 mEq/L Final  . Potassium 03/18/2015 4.2  3.5 - 5.1 mEq/L Final  . Chloride 03/18/2015 103  98 - 109 mEq/L Final  . CO2 03/18/2015 22  22 - 29 mEq/L Final  . Glucose 03/18/2015 117  70 - 140 mg/dl Final   Glucose reference range is for nonfasting patients. Fasting glucose reference range is 70- 100.  [name] BUN 03/18/2015 20.1  7.0 - 26.0 mg/dL Final  . Creatinine 03/18/2015 0.7  0.6 - 1.1 mg/dL Final  . Total Bilirubin 03/18/2015 0.74  0.20 - 1.20 mg/dL Final  . Alkaline Phosphatase 03/18/2015 201* 40 - 150 U/L Final  . AST 03/18/2015 39* 5 - 34 U/L Final  . ALT 03/18/2015 28  0 - 55 U/L Final  . Total Protein 03/18/2015 5.7* 6.4 - 8.3 g/dL Final  . Albumin 03/18/2015 2.4* 3.5 - 5.0 g/dL Final  . Calcium 03/18/2015 9.6  8.4 - 10.4 mg/dL Final  . Anion Gap 03/18/2015 13* 3 - 11 mEq/L Final  . EGFR 03/18/2015 >90  >90 ml/min/1.73 m2 Final   eGFR is calculated using the CKD-EPI Creatinine  Equation (2009)     RADIOGRAPHIC STUDIES: No results found.  ASSESSMENT/PLAN:    Cholangiocarcinoma [facility]) Patient received her last cycle of FOLFOX chemotherapy on 03/06/2015.  Since that time.-Patient was admitted to the hospital from 03/10/2015 through 03/13/2015 with complaints of cough, bilateral conjunctivitis, otitis media to the right ear, and dehydration.  She was  discharged with Augmentin antibiotics.  However, patient presented once again to the emergency department on 03/14/2015 for what patient describes as fluid overload.  She was discharged from the emergency department with the Lasix prescription.  Patient states she is feeling much better today; and is eager to proceed with her chemotherapy.  She denies any issues with her eyes or her ears.  She denies any recent fevers or chills.  Blood counts obtained today revealed a WBC of 7.2, ANC 5.9, hemoglobin 12.4, and platelet count 252.  Review Dr. Hazeline Junker most recent note-and patient will proceed today with planned cycle 2 of her FOLFOX chemotherapy.  Patient is scheduled to return on 04/01/2015 for labs, visit, and chemotherapy.  Patient stated understanding of all instructions; and was in agreement with this plan of care. The patient knows to call the clinic with any problems, questions or concerns.   Review/collaboration with Dr. Alen Blew regarding all aspects of patient's visit today.   Total time spent with patient was 25 minutes;  with greater than 75 percent of that time spent in face to face counseling regarding patient's symptoms,  and coordination of care and follow up.  Disclaimer:This dictation was prepared with Dragon/digital dictation along with [employer]. Any transcriptional errors that result from this process are unintentional.  Drue Second, NP 03/20/2015

## 2015-03-23 ENCOUNTER — Telehealth: Payer: Self-pay | Admitting: *Deleted

## 2015-03-23 NOTE — Telephone Encounter (Signed)
Patient and calling to say she has tightness on right side of her chest, states she fell last night and hit the commode on her right side. A little swelling, denies bruising, hemoptysis, or SOB  on inspiration. Instructed her to report to the E.D. Should any of these symptoms occur. Patient also requests palliative services in [place]  at this time and would like for dr Alen Blew to be the attending. Note to dr Hazeline Junker desk.

## 2015-03-24 ENCOUNTER — Other Ambulatory Visit: Payer: Self-pay

## 2015-03-24 ENCOUNTER — Emergency Department (HOSPITAL_COMMUNITY): Payer: Medicare Other

## 2015-03-24 ENCOUNTER — Encounter (HOSPITAL_COMMUNITY): Payer: Self-pay | Admitting: Emergency Medicine

## 2015-03-24 ENCOUNTER — Emergency Department (HOSPITAL_COMMUNITY)
Admission: EM | Admit: 2015-03-24 | Discharge: 2015-03-24 | Disposition: A | Discharge status: Home / Self Care | Payer: Medicare Other | Attending: Emergency Medicine | Admitting: Emergency Medicine

## 2015-03-24 DIAGNOSIS — Z8669 Personal history of other diseases of the nervous system and sense organs: Secondary | ICD-10-CM | POA: Diagnosis not present

## 2015-03-24 DIAGNOSIS — Z7952 Long term (current) use of systemic steroids: Secondary | ICD-10-CM | POA: Diagnosis not present

## 2015-03-24 DIAGNOSIS — K5900 Constipation, unspecified: Secondary | ICD-10-CM

## 2015-03-24 DIAGNOSIS — M109 Gout, unspecified: Secondary | ICD-10-CM | POA: Insufficient documentation

## 2015-03-24 DIAGNOSIS — I10 Essential (primary) hypertension: Secondary | ICD-10-CM | POA: Insufficient documentation

## 2015-03-24 DIAGNOSIS — Z8509 Personal history of malignant neoplasm of other digestive organs: Secondary | ICD-10-CM | POA: Diagnosis not present

## 2015-03-24 DIAGNOSIS — R531 Weakness: Secondary | ICD-10-CM | POA: Insufficient documentation

## 2015-03-24 DIAGNOSIS — Z79899 Other long term (current) drug therapy: Secondary | ICD-10-CM | POA: Insufficient documentation

## 2015-03-24 DIAGNOSIS — M1990 Unspecified osteoarthritis, unspecified site: Secondary | ICD-10-CM | POA: Insufficient documentation

## 2015-03-24 DIAGNOSIS — M549 Dorsalgia, unspecified: Secondary | ICD-10-CM | POA: Diagnosis not present

## 2015-03-24 DIAGNOSIS — R109 Unspecified abdominal pain: Secondary | ICD-10-CM | POA: Diagnosis present

## 2015-03-24 DIAGNOSIS — E039 Hypothyroidism, unspecified: Secondary | ICD-10-CM | POA: Diagnosis not present

## 2015-03-24 LAB — CBC WITH DIFFERENTIAL/PLATELET
BASOS ABS: 0 10*3/uL (ref 0.0–0.1)
BASOS PCT: 0 %
Eosinophils Absolute: 0.1 10*3/uL (ref 0.0–0.7)
Eosinophils Relative: 1 %
HEMATOCRIT: 34.3 % — AB (ref 36.0–46.0)
HEMOGLOBIN: 11.4 g/dL — AB (ref 12.0–15.0)
LYMPHS PCT: 10 %
Lymphs Abs: 1.2 10*3/uL (ref 0.7–4.0)
MCH: 31.6 pg (ref 26.0–34.0)
MCHC: 33.2 g/dL (ref 30.0–36.0)
MCV: 95 fL (ref 78.0–100.0)
MONO ABS: 0.7 10*3/uL (ref 0.1–1.0)
Monocytes Relative: 6 %
NEUTROS ABS: 10.4 10*3/uL — AB (ref 1.7–7.7)
NEUTROS PCT: 83 %
Platelets: 152 10*3/uL (ref 150–400)
RBC: 3.61 MIL/uL — ABNORMAL LOW (ref 3.87–5.11)
RDW: 18.9 % — AB (ref 11.5–15.5)
WBC: 12.4 10*3/uL — ABNORMAL HIGH (ref 4.0–10.5)

## 2015-03-24 LAB — COMPREHENSIVE METABOLIC PANEL
ALBUMIN: 2.2 g/dL — AB (ref 3.5–5.0)
ALK PHOS: 170 U/L — AB (ref 38–126)
ALT: 31 U/L (ref 14–54)
ANION GAP: 9 (ref 5–15)
AST: 51 U/L — ABNORMAL HIGH (ref 15–41)
BUN: 18 mg/dL (ref 6–20)
CHLORIDE: 103 mmol/L (ref 101–111)
CO2: 23 mmol/L (ref 22–32)
Calcium: 8.7 mg/dL — ABNORMAL LOW (ref 8.9–10.3)
Creatinine, Ser: 0.7 mg/dL (ref 0.44–1.00)
GFR calc non Af Amer: >60 mL/min (ref >60)
GLUCOSE: 100 mg/dL — AB (ref 65–99)
Potassium: 4.1 mmol/L (ref 3.5–5.1)
SODIUM: 135 mmol/L (ref 135–145)
TOTAL PROTEIN: 4.9 g/dL — AB (ref 6.5–8.1)
Total Bilirubin: 0.6 mg/dL (ref 0.3–1.2)

## 2015-03-24 LAB — URINALYSIS, ROUTINE W REFLEX MICROSCOPIC
BILIRUBIN URINE: NEGATIVE
Glucose, UA: NEGATIVE mg/dL
Ketones, ur: NEGATIVE mg/dL
LEUKOCYTES UA: NEGATIVE
NITRITE: NEGATIVE
PH: 6.5 (ref 5.0–8.0)
Protein, ur: NEGATIVE mg/dL
SPECIFIC GRAVITY, URINE: 1.011 (ref 1.005–1.030)

## 2015-03-24 LAB — URINE MICROSCOPIC-ADD ON

## 2015-03-24 LAB — LIPASE, BLOOD: Lipase: 23 U/L (ref 11–51)

## 2015-03-24 LAB — TROPONIN I

## 2015-03-24 MED ORDER — HEPARIN SOD (PORK) LOCK FLUSH 100 UNIT/ML IV SOLN
500.0000 [IU] | Freq: Once | INTRAVENOUS | Status: AC
  Administered 2015-03-24: 500 [IU]
  Filled 2015-03-24: qty 5

## 2015-03-24 NOTE — ED Notes (Addendum)
Pt SpO2 dropping to 89% when sleeping; O2 applied and SpO2 improved to 95%

## 2015-03-24 NOTE — ED Notes (Addendum)
Pt reports to ER from home; pt states "I hurt under my ribs" and in back; pt took hydrocodone around 2pm; pt states she was constipated this AM and her caretaker gave her a suppository at 0930 that alleviated her constipation; pt and daughter express concern that constipation and rib/back pain are related; NAD noted; pt calm and cooperative. Pt also prescribed Miralax but does not take it everyday. Pt also has a cough that is reported to have been present for about 6 weeks

## 2015-03-24 NOTE — ED Notes (Signed)
EKG delay due to pt going to X-ray.

## 2015-03-24 NOTE — ED Notes (Addendum)
Pt returned from X-ray.  

## 2015-03-24 NOTE — Discharge Instructions (Signed)
Constipation, Adult °Constipation is when a person has fewer than three bowel movements a week, has difficulty having a bowel movement, or has stools that are dry, hard, or larger than normal. As people grow older, constipation is more common. A low-fiber diet, not taking in enough fluids, and taking certain medicines may make constipation worse.  °CAUSES  °· Certain medicines, such as antidepressants, pain medicine, iron supplements, antacids, and water pills.   °· Certain diseases, such as diabetes, irritable bowel syndrome (IBS), thyroid disease, or depression.   °· Not drinking enough water.   °· Not eating enough fiber-rich foods.   °· Stress or travel.   °· Lack of physical activity or exercise.   °· Ignoring the urge to have a bowel movement.   °· Using laxatives too much.   °SIGNS AND SYMPTOMS  °· Having fewer than three bowel movements a week.   °· Straining to have a bowel movement.   °· Having stools that are hard, dry, or larger than normal.   °· Feeling full or bloated.   °· Pain in the lower abdomen.   °· Not feeling relief after having a bowel movement.   °DIAGNOSIS  °Your health care provider will take a medical history and perform a physical exam. Further testing may be done for severe constipation. Some tests may include: °· A barium enema X-ray to examine your rectum, colon, and, sometimes, your small intestine.   °· A sigmoidoscopy to examine your lower colon.   °· A colonoscopy to examine your entire colon. °TREATMENT  °Treatment will depend on the severity of your constipation and what is causing it. Some dietary treatments include drinking more fluids and eating more fiber-rich foods. Lifestyle treatments may include regular exercise. If these diet and lifestyle recommendations do not help, your health care provider may recommend taking over-the-counter laxative medicines to help you have bowel movements. Prescription medicines may be prescribed if over-the-counter medicines do not work.    °HOME CARE INSTRUCTIONS  °· Eat foods that have a lot of fiber, such as fruits, vegetables, whole grains, and beans. °· Limit foods high in fat and processed sugars, such as french fries, hamburgers, cookies, candies, and soda.   °· A fiber supplement may be added to your diet if you cannot get enough fiber from foods.   °· Drink enough fluids to keep your urine clear or pale yellow.   °· Exercise regularly or as directed by your health care provider.   °· Go to the restroom when you have the urge to go. Do not hold it.   °· Only take over-the-counter or prescription medicines as directed by your health care provider. Do not take other medicines for constipation without talking to your health care provider first.   °SEEK IMMEDIATE MEDICAL CARE IF:  °· You have bright red blood in your stool.   °· Your constipation lasts for more than 4 days or gets worse.   °· You have abdominal or rectal pain.   °· You have thin, pencil-like stools.   °· You have unexplained weight loss. °MAKE SURE YOU:  °· Understand these instructions. °· Will watch your condition. °· Will get help right away if you are not doing well or get worse. °  °This information is not intended to replace advice given to you by your health care provider. Make sure you discuss any questions you have with your health care provider. °  °Document Released: 01/15/2004 Document Revised: 05/09/2014 Document Reviewed: 01/28/2013 °Elsevier Interactive Patient Education ©2016 [employer] Inc[employer] ° °High-Fiber Diet °Fiber, also called dietary fiber, is a type of carbohydrate found in fruits, vegetables, whole grains, and   beans. A high-fiber diet can have many health benefits. Your health care provider may recommend a high-fiber diet to help: °· Prevent constipation. Fiber can make your bowel movements more regular. °· Lower your cholesterol. °· Relieve hemorrhoids, uncomplicated diverticulosis, or irritable bowel syndrome. °· Prevent overeating as part of a weight-loss  plan. °· Prevent heart disease, type 2 diabetes, and certain cancers. °WHAT IS MY PLAN? °The recommended daily intake of fiber includes: °· 38 grams for men under age 50. °· 30 grams for men over age 50. °· 25 grams for women under age 50. °· 21 grams for women over age 50. °You can get the recommended daily intake of dietary fiber by eating a variety of fruits, vegetables, grains, and beans. Your health care provider may also recommend a fiber supplement if it is not possible to get enough fiber through your diet. °WHAT DO I NEED TO KNOW ABOUT A HIGH-FIBER DIET? °· Fiber supplements have not been widely studied for their effectiveness, so it is better to get fiber through food sources. °· Always check the fiber content on the nutrition facts label of any prepackaged food. Look for foods that contain at least 5 grams of fiber per serving. °· Ask your dietitian if you have questions about specific foods that are related to your condition, especially if those foods are not listed in the following section. °· Increase your daily fiber consumption gradually. Increasing your intake of dietary fiber too quickly may cause bloating, cramping, or gas. °· Drink plenty of water. Water helps you to digest fiber. °WHAT FOODS CAN I EAT? °Grains °Whole-grain breads. Multigrain cereal. Oats and oatmeal. Brown rice. Barley. Bulgur wheat. Millet. Bran muffins. Popcorn. Rye wafer crackers. °Vegetables °Sweet potatoes. Spinach. Kale. Artichokes. Cabbage. Broccoli. Green peas. Carrots. Squash. °Fruits °Berries. Pears. Apples. Oranges. Avocados. Prunes and raisins. Dried figs. °Meats and Other Protein Sources °Navy, kidney, pinto, and soy beans. Split peas. Lentils. Nuts and seeds. °Dairy °Fiber-fortified yogurt. °Beverages °Fiber-fortified soy milk. Fiber-fortified orange juice. °Other °Fiber bars. °The items listed above may not be a complete list of recommended foods or beverages. Contact your dietitian for more options. °WHAT FOODS  ARE NOT RECOMMENDED? °Grains °White bread. Pasta made with refined flour. White rice. °Vegetables °Fried potatoes. Canned vegetables. Well-cooked vegetables.  °Fruits °Fruit juice. Cooked, strained fruit. °Meats and Other Protein Sources °Fatty cuts of meat. Fried poultry or fried fish. °Dairy °Milk. Yogurt. Cream cheese. Sour cream. °Beverages °Soft drinks. °Other °Cakes and pastries. Butter and oils. °The items listed above may not be a complete list of foods and beverages to avoid. Contact your dietitian for more information. °WHAT ARE SOME TIPS FOR INCLUDING HIGH-FIBER FOODS IN MY DIET? °· Eat a wide variety of high-fiber foods. °· Make sure that half of all grains consumed each day are whole grains. °· Replace breads and cereals made from refined flour or white flour with whole-grain breads and cereals. °· Replace white rice with brown rice, bulgur wheat, or millet. °· Start the day with a breakfast that is high in fiber, such as a cereal that contains at least 5 grams of fiber per serving. °· Use beans in place of meat in soups, salads, or pasta. °· Eat high-fiber snacks, such as berries, raw vegetables, nuts, or popcorn. °  °This information is not intended to replace advice given to you by your health care provider. Make sure you discuss any questions you have with your health care provider. °  °Document Released: 04/18/2005 Document Revised: 05/09/2014 Document   Reviewed: 10/01/2013 °Elsevier Interactive Patient Education ©2016 Elsevier Inc. ° °

## 2015-03-24 NOTE — ED Provider Notes (Signed)
CSN: [id number]     Arrival date & time 03/24/15  1646 History   First MD Initiated Contact with Patient 03/24/15 1656     Chief Complaint  Patient presents with  . Back Pain  . Weakness     (Consider location/radiation/quality/duration/timing/severity/associated sxs/prior Treatment) HPI Comments: 64 year old female with extensive past medical history including cholangiocarcinoma, hypertension, hyperlipidemia, gout who presents with abdominal pain and constipation. Patient reports pain in her abdomen and just below her ribs bilaterally that began today and has been associated with recent constipation. She reports some mild low back pain earlier today and then later began having severe pain in her sides. She had an enema at home which led to a bowel movement that alleviated some of her symptoms. She has been given MiraLAX prescription but does not take it daily. She also reports a six-week history of cough. She denies any associated fevers, chest pain, shortness of breath, vomiting, blood in her stool, or urinary symptoms.  Patient is a 64 y.o. female presenting with back pain and weakness. The history is provided by the patient.  Back Pain Associated symptoms: weakness   Weakness    Past Medical History  Diagnosis Date  . PONV (postoperative nausea and vomiting)   . Hypertension   . Hypothyroidism   . Arthritis   . Gout   . Neuromuscular disorder ([place])   . Hyperlipidemia   . Family history of anesthesia complication     '" MY OLDEST SON [place] "  . Cholangiocarcinoma ([place]) 08/05/2013  . Diverticulosis of large intestine without hemorrhage 03/05/2014  . S/P radiation therapy 01/29/15-02/11/15    C5-T10 30Gy/24fx   Past Surgical History  Procedure Laterality Date  . Cervical fusion  '99 & '03  . Rotator cuff surgery  '08    right  . Cataract extraction w/phaco  12/13/2010    Procedure: CATARACT EXTRACTION PHACO AND INTRAOCULAR LENS PLACEMENT (IOC);  Surgeon: Tonny Branch;  Location: [facility];  Service: Ophthalmology;  Laterality: Right;  CDE: 8.94  . Eye surgery    . Back surgery      2009  . Back surgery  2011    lumbar surgery  . Tubal ligation  1979  . Rotator cuff repair Left 04/09/2013    DR Marlou Sa  . Shoulder arthroscopy with subacromial decompression and open rotator c Left 04/09/2013    Procedure: SHOULDER ARTHROSCOPY WITH SUBACROMIAL DECOMPRESSION AND MINI OPEN ROTATOR CUFF REPAIR, POSSIBLE BICEP TENDONODESIS,  ;  Surgeon: Meredith Pel, MD;  Location: [place];  Service: Orthopedics;  Laterality: Left;   Family History  Problem Relation Age of Onset  . Anesthesia problems Neg Hx   . Hypotension Neg Hx   . Malignant hyperthermia Neg Hx   . Pseudochol deficiency Neg Hx   . Diabetes Sister   . Stroke Mother   . Heart disease Father   . Stroke Father   . Diabetes Brother   . Diabetes Sister   . Diabetes Sister   . Diabetes Sister    Social History  Substance Use Topics  . Smoking status: Never Smoker   . Smokeless tobacco: Never Used  . Alcohol Use: No   OB History    No data available     Review of Systems  Musculoskeletal: Positive for back pain.  Neurological: Positive for weakness.   10 Systems reviewed and are negative for acute change except as noted in the HPI.    Allergies  Livalo; Rosuvastatin  calcium; Simvastatin; and Statins  Home Medications   Prior to Admission medications   Medication Sig Start Date End Date Taking? Authorizing Provider  allopurinol (ZYLOPRIM) 300 MG tablet TAKE 1 TABLET (300 MG TOTAL) BY MOUTH DAILY. 12/16/14  Yes [name] Hassell Done, FNP  bifidobacterium infantis (ALIGN) capsule Take 1 capsule by mouth daily.   Yes Historical Provider, MD  dexamethasone (DECADRON) 0.5 MG/5ML solution Take 5 cc BID. Patient taking differently: Take 0.5 mg by mouth 2 (two) times daily.  02/24/15  Yes Wyatt Portela, MD  HYDROcodone-acetaminophen (NORCO/VICODIN) 5-325 MG tablet Take 1 tablet by mouth every  6 (six) hours as needed for moderate pain. Pain. 03/13/15  Yes Historical Provider, MD  HYDROcodone-homatropine (HYCODAN) 5-1.5 MG/5ML syrup Take 5 mLs by mouth every 6 (six) hours as needed for cough. 03/18/15  Yes Susanne Borders, NP  lidocaine-prilocaine (EMLA) cream Apply 1 application topically as needed. Apply cream, on days of treatment, to [facility] site 1-2 hours prior to treatment. 08/27/13  Yes Wyatt Portela, MD  morphine (MSIR) 15 MG tablet Take 1 tablet (15 mg total) by mouth every 4 (four) hours as needed for severe pain. 02/24/15  Yes Wyatt Portela, MD  ondansetron (ZOFRAN) 8 MG tablet Take 1 tablet (8 mg total) by mouth every 8 (eight) hours as needed for nausea or vomiting. 02/24/15  Yes Wyatt Portela, MD  polyethylene glycol (MIRALAX / GLYCOLAX) packet Take 17 g by mouth daily as needed for mild constipation.   Yes Historical Provider, MD  potassium chloride SA (K-DUR,KLOR-CON) 20 MEQ tablet Take 1 tablet (20 mEq total) by mouth 2 (two) times daily. 03/04/15  Yes Wyatt Portela, MD  prochlorperazine (COMPAZINE) 10 MG tablet Take 1 tablet (10 mg total) by mouth every 6 (six) hours as needed for nausea or vomiting. 07/03/14  Yes Adrena E Johnson, PA-C  sucralfate (CARAFATE) 1 G tablet Take 1 tablet (1 g total) by mouth 4 (four) times daily -  with meals and at bedtime. 03/04/15  Yes Susanne Borders, NP  SYNTHROID 88 MCG tablet TAKE 1 TABLET (88 MCG TOTAL) BY MOUTH DAILY BEFORE BREAKFAST. Patient taking differently: Take 1 tablet (88 mcg total) by mouth daily before breakfast. 02/26/15  Yes Fransisca Kaufmann Dettinger, MD  temazepam (RESTORIL) 15 MG capsule Take 1 capsule (15 mg total) by mouth at bedtime as needed for sleep. 03/04/15  Yes Wyatt Portela, MD  albuterol (PROVENTIL HFA;VENTOLIN HFA) 108 (90 BASE) MCG/ACT inhaler Inhale 2 puffs into the lungs every 6 (six) hours as needed for wheezing or shortness of breath. Patient not taking: Reported on 03/10/2015 01/14/15   Fransisca Kaufmann Dettinger, MD   amoxicillin-clavulanate (AUGMENTIN) 875-125 MG tablet Take 1 tablet by mouth 2 (two) times daily. Patient not taking: Reported on 03/20/2015 03/13/15   Verlee Monte, MD  dexamethasone (DECADRON) 0.1 % ophthalmic solution Place 1 drop into both eyes every 4 (four) hours. Patient not taking: Reported on 03/20/2015 03/13/15   Verlee Monte, MD  fluconazole (DIFLUCAN) 100 MG tablet Take 1 tablet (100 mg total) by mouth daily. Patient not taking: Reported on 03/24/2015 03/04/15   Susanne Borders, NP  furosemide (LASIX) 20 MG tablet Take 1 tablet (20 mg total) by mouth daily. Patient not taking: Reported on 03/24/2015 03/14/15   Deno Etienne, DO  morphine 10 MG/5ML solution Take 2.5-5 mLs (5-10 mg total) by mouth every 4 (four) hours as needed for severe pain. Patient not taking: Reported on 03/10/2015 02/21/15  Venetia Maxon Rama, MD  ondansetron (ZOFRAN) 8 MG tablet Take 1 tablet (8 mg total) by mouth every 8 (eight) hours as needed for nausea or vomiting. Patient not taking: Reported on 03/24/2015 02/24/15   Wyatt Portela, MD  trimethoprim-polymyxin b (POLYTRIM) ophthalmic solution Place 1 drop into both eyes every 4 (four) hours. Patient not taking: Reported on 03/20/2015 03/13/15   Verlee Monte, MD   BP 136/74 mmHg  Pulse 92  Temp(Src) 97.6 F (36.4 C) (Oral)  Resp 16  Ht 5\' 5"  (1.651 m)  Wt 175 lb (79.379 kg)  BMI 29.12 kg/m2  SpO2 96% Physical Exam  Constitutional: She is oriented to person, place, and time. She appears well-developed and well-nourished.  Pale, chronically ill-appearing but in no acute distress  HENT:  Head: Normocephalic and atraumatic.  Moist mucous membranes  Eyes: Conjunctivae are normal. Pupils are equal, round, and reactive to light.  Neck: Neck supple.  Cardiovascular: Normal rate, regular rhythm and normal heart sounds.   No murmur heard. Pulmonary/Chest: Effort normal and breath sounds normal.  Abdominal: Soft. Bowel sounds are normal. She exhibits no  distension.  Mild left upper quadrant and right upper quadrant tenderness without rebound or guarding  Musculoskeletal:  Trace bilateral lower extremity edema  Neurological: She is alert and oriented to person, place, and time.  Fluent speech  Skin: Skin is warm and dry.  Psychiatric: She has a normal mood and affect. Judgment normal.  Nursing note and vitals reviewed.   ED Course  Procedures (including critical care time) Labs Review Labs Reviewed  COMPREHENSIVE METABOLIC PANEL - Abnormal; Notable for the following:    Glucose, Bld 100 (*)    Calcium 8.7 (*)    Total Protein 4.9 (*)    Albumin 2.2 (*)    AST 51 (*)    Alkaline Phosphatase 170 (*)    All other components within normal limits  CBC WITH DIFFERENTIAL/PLATELET - Abnormal; Notable for the following:    WBC 12.4 (*)    RBC 3.61 (*)    Hemoglobin 11.4 (*)    HCT 34.3 (*)    RDW 18.9 (*)    Neutro Abs 10.4 (*)    All other components within normal limits  URINALYSIS, ROUTINE W REFLEX MICROSCOPIC (NOT AT [facility]) - Abnormal; Notable for the following:    Hgb urine dipstick TRACE (*)    All other components within normal limits  URINE MICROSCOPIC-ADD ON - Abnormal; Notable for the following:    Squamous Epithelial / LPF 0-5 (*)    Bacteria, UA RARE (*)    All other components within normal limits  TROPONIN I  LIPASE, BLOOD    Imaging Review Dg Chest 2 View  03/24/2015  CLINICAL DATA:  Patient reports pain in ribs and in back. EXAM: CHEST  2 VIEW COMPARISON:  03/14/2015 FINDINGS: Right chest wall port a catheter is noted with tip in the cavoatrial junction. Normal heart size. Small pleural effusions are again noted. There is diminished aeration of both lung bases. IMPRESSION: 1. Small pleural effusions 2. Diminished aeration the lung bases which may reflect atelectasis or airspace consolidation. Electronically Signed   By: Kerby Moors M.D.   On: 03/24/2015 17:53   Dg Abd 1 View  03/24/2015  CLINICAL DATA:   Abdominal pain and constipation today. History of cholangiocarcinoma. EXAM: ABDOMEN - 1 VIEW COMPARISON:  CT abdomen and pelvis 03/10/2015 FINDINGS: Gas and stool seen throughout the colon. No small or large bowel distention. No radiopaque  stones. Postoperative changes with posterior fixation and laminectomies from L3-L5. IMPRESSION: Normal nonobstructive bowel gas pattern. Electronically Signed   By: Lucienne Capers M.D.   On: 03/24/2015 20:33   I have personally reviewed and evaluated these lab results as part of my medical decision-making.   EKG Interpretation   Date/Time:  Tuesday March 24 2015 17:38:38 EST Ventricular Rate:  91 PR Interval:  113 QRS Duration: 71 QT Interval:  370 QTC Calculation: 455 R Axis:   14 Text Interpretation:  Sinus rhythm Borderline short PR interval Borderline  low voltage, extremity leads No significant change since last tracing  Confirmed by Kiyanna Biegler MD, Morgane Joerger [phone]) on 03/24/2015 7:41:29 PM       MDM   Final diagnoses:  Constipation, unspecified constipation type    Patient with history of cholangiocarcinoma who presents with ongoing constipation and pain in her upper sides that began earlier today. At presentation, the patient was awake, alert, and in no acute distress. Vital signs stable, O2 sat 94% on room air. She had mild left upper quadrant and right upper quadrant tenderness without rebound or guarding. She initially reported pain "under ribs" but on exam indicates abdominal rather than chest pain. EKG without significant change since previous. Chest x-ray shows small bilateral pleural effusions but no obvious infiltrate. Obtained KUB to evaluate for obstruction. KUB showed gas and stool but no distention to suggest obstructive process. Labs show stable CMP and otherwise unremarkable labs. On reexamination, the patient is comfortable. I discussed findings with the patient and her daughter. Her daughter reports that she is not sure that the  patient has been taking MiraLAX every day as prescribed. Daughter also reports that the patient's abdominal pain was significantly improved this morning after a bowel movement. I suspect that her symptoms are due to chronic constipation, likely exacerbated by chronic opiate use. Her low back pain has been mild and intermittent and is not associated with any lower extremity weakness, bowel/bladder incontinence or anesthesia to suggest acute process. Discussed constipation treatment at home as well as importance of follow-up with PCP if over-the-counter medications do not relieve her symptoms. Family voiced understanding of plan and patient discharged in satisfactory condition.  Sharlett Iles, MD 03/24/15 [phone]

## 2015-03-30 ENCOUNTER — Encounter (HOSPITAL_COMMUNITY): Payer: Self-pay | Admitting: Emergency Medicine

## 2015-03-30 ENCOUNTER — Telehealth: Payer: Self-pay | Admitting: Nurse Practitioner

## 2015-03-30 ENCOUNTER — Emergency Department (HOSPITAL_COMMUNITY): Payer: Medicare Other

## 2015-03-30 ENCOUNTER — Inpatient Hospital Stay (HOSPITAL_COMMUNITY)
Admission: EM | Admit: 2015-03-30 | Discharge: 2015-04-03 | DRG: 871 | Disposition: A | Discharge status: Hospice | Payer: Medicare Other | Attending: Internal Medicine | Admitting: Internal Medicine

## 2015-03-30 DIAGNOSIS — J90 Pleural effusion, not elsewhere classified: Secondary | ICD-10-CM | POA: Diagnosis present

## 2015-03-30 DIAGNOSIS — C7951 Secondary malignant neoplasm of bone: Secondary | ICD-10-CM | POA: Diagnosis present

## 2015-03-30 DIAGNOSIS — J189 Pneumonia, unspecified organism: Secondary | ICD-10-CM | POA: Diagnosis present

## 2015-03-30 DIAGNOSIS — Z981 Arthrodesis status: Secondary | ICD-10-CM | POA: Diagnosis not present

## 2015-03-30 DIAGNOSIS — C221 Intrahepatic bile duct carcinoma: Secondary | ICD-10-CM | POA: Diagnosis present

## 2015-03-30 DIAGNOSIS — E872 Acidosis, unspecified: Secondary | ICD-10-CM

## 2015-03-30 DIAGNOSIS — Z7189 Other specified counseling: Secondary | ICD-10-CM | POA: Diagnosis not present

## 2015-03-30 DIAGNOSIS — M109 Gout, unspecified: Secondary | ICD-10-CM | POA: Diagnosis present

## 2015-03-30 DIAGNOSIS — J312 Chronic pharyngitis: Secondary | ICD-10-CM | POA: Diagnosis present

## 2015-03-30 DIAGNOSIS — R627 Adult failure to thrive: Secondary | ICD-10-CM | POA: Diagnosis present

## 2015-03-30 DIAGNOSIS — Z79899 Other long term (current) drug therapy: Secondary | ICD-10-CM

## 2015-03-30 DIAGNOSIS — F419 Anxiety disorder, unspecified: Secondary | ICD-10-CM | POA: Diagnosis present

## 2015-03-30 DIAGNOSIS — Z923 Personal history of irradiation: Secondary | ICD-10-CM

## 2015-03-30 DIAGNOSIS — E860 Dehydration: Secondary | ICD-10-CM | POA: Diagnosis present

## 2015-03-30 DIAGNOSIS — C787 Secondary malignant neoplasm of liver and intrahepatic bile duct: Secondary | ICD-10-CM | POA: Diagnosis not present

## 2015-03-30 DIAGNOSIS — R4702 Dysphasia: Secondary | ICD-10-CM | POA: Diagnosis present

## 2015-03-30 DIAGNOSIS — K5900 Constipation, unspecified: Secondary | ICD-10-CM | POA: Diagnosis present

## 2015-03-30 DIAGNOSIS — R531 Weakness: Secondary | ICD-10-CM | POA: Diagnosis not present

## 2015-03-30 DIAGNOSIS — Z833 Family history of diabetes mellitus: Secondary | ICD-10-CM

## 2015-03-30 DIAGNOSIS — R600 Localized edema: Secondary | ICD-10-CM | POA: Diagnosis present

## 2015-03-30 DIAGNOSIS — E039 Hypothyroidism, unspecified: Secondary | ICD-10-CM | POA: Diagnosis present

## 2015-03-30 DIAGNOSIS — N390 Urinary tract infection, site not specified: Secondary | ICD-10-CM | POA: Diagnosis present

## 2015-03-30 DIAGNOSIS — R059 Cough, unspecified: Secondary | ICD-10-CM | POA: Diagnosis present

## 2015-03-30 DIAGNOSIS — Z515 Encounter for palliative care: Secondary | ICD-10-CM

## 2015-03-30 DIAGNOSIS — E785 Hyperlipidemia, unspecified: Secondary | ICD-10-CM | POA: Diagnosis present

## 2015-03-30 DIAGNOSIS — I10 Essential (primary) hypertension: Secondary | ICD-10-CM | POA: Diagnosis present

## 2015-03-30 DIAGNOSIS — J9811 Atelectasis: Secondary | ICD-10-CM | POA: Diagnosis present

## 2015-03-30 DIAGNOSIS — E8809 Other disorders of plasma-protein metabolism, not elsewhere classified: Secondary | ICD-10-CM | POA: Diagnosis present

## 2015-03-30 DIAGNOSIS — Y842 Radiological procedure and radiotherapy as the cause of abnormal reaction of the patient, or of later complication, without mention of misadventure at the time of the procedure: Secondary | ICD-10-CM | POA: Diagnosis present

## 2015-03-30 DIAGNOSIS — R05 Cough: Secondary | ICD-10-CM | POA: Diagnosis present

## 2015-03-30 DIAGNOSIS — Z8249 Family history of ischemic heart disease and other diseases of the circulatory system: Secondary | ICD-10-CM | POA: Diagnosis not present

## 2015-03-30 DIAGNOSIS — B3781 Candidal esophagitis: Secondary | ICD-10-CM | POA: Diagnosis present

## 2015-03-30 DIAGNOSIS — I313 Pericardial effusion (noninflammatory): Secondary | ICD-10-CM | POA: Diagnosis present

## 2015-03-30 DIAGNOSIS — M1990 Unspecified osteoarthritis, unspecified site: Secondary | ICD-10-CM | POA: Diagnosis present

## 2015-03-30 DIAGNOSIS — Z66 Do not resuscitate: Secondary | ICD-10-CM | POA: Diagnosis present

## 2015-03-30 DIAGNOSIS — G893 Neoplasm related pain (acute) (chronic): Secondary | ICD-10-CM | POA: Insufficient documentation

## 2015-03-30 DIAGNOSIS — A419 Sepsis, unspecified organism: Principal | ICD-10-CM | POA: Diagnosis present

## 2015-03-30 DIAGNOSIS — R5081 Fever presenting with conditions classified elsewhere: Secondary | ICD-10-CM | POA: Diagnosis not present

## 2015-03-30 HISTORY — DX: Malignant neoplasm of liver, not specified as primary or secondary: C22.9

## 2015-03-30 LAB — CBC WITH DIFFERENTIAL/PLATELET
Basophils Absolute: 0 10*3/uL (ref 0.0–0.1)
Basophils Relative: 0 %
EOS ABS: 0.2 10*3/uL (ref 0.0–0.7)
Eosinophils Relative: 1 %
HCT: 36 % (ref 36.0–46.0)
Hemoglobin: 12 g/dL (ref 12.0–15.0)
LYMPHS ABS: 1.1 10*3/uL (ref 0.7–4.0)
Lymphocytes Relative: 6 %
MCH: 32.4 pg (ref 26.0–34.0)
MCHC: 33.3 g/dL (ref 30.0–36.0)
MCV: 97.3 fL (ref 78.0–100.0)
MONO ABS: 1.7 10*3/uL — AB (ref 0.1–1.0)
Monocytes Relative: 9 %
NEUTROS PCT: 84 %
Neutro Abs: 15.6 10*3/uL — ABNORMAL HIGH (ref 1.7–7.7)
PLATELETS: 149 10*3/uL — AB (ref 150–400)
RBC: 3.7 MIL/uL — AB (ref 3.87–5.11)
RDW: 21.4 % — AB (ref 11.5–15.5)
WBC: 18.6 10*3/uL — AB (ref 4.0–10.5)

## 2015-03-30 LAB — COMPREHENSIVE METABOLIC PANEL
ALK PHOS: 281 U/L — AB (ref 38–126)
ALT: 35 U/L (ref 14–54)
AST: 63 U/L — ABNORMAL HIGH (ref 15–41)
Albumin: 2.3 g/dL — ABNORMAL LOW (ref 3.5–5.0)
Anion gap: 12 (ref 5–15)
BILIRUBIN TOTAL: 1.1 mg/dL (ref 0.3–1.2)
BUN: 21 mg/dL — AB (ref 6–20)
CALCIUM: 9.2 mg/dL (ref 8.9–10.3)
CO2: 22 mmol/L (ref 22–32)
CREATININE: 0.63 mg/dL (ref 0.44–1.00)
Chloride: 106 mmol/L (ref 101–111)
Glucose, Bld: 108 mg/dL — ABNORMAL HIGH (ref 65–99)
Potassium: 4.2 mmol/L (ref 3.5–5.1)
Sodium: 140 mmol/L (ref 135–145)
TOTAL PROTEIN: 5.1 g/dL — AB (ref 6.5–8.1)

## 2015-03-30 LAB — URINALYSIS, ROUTINE W REFLEX MICROSCOPIC
BILIRUBIN URINE: NEGATIVE
Glucose, UA: NEGATIVE mg/dL
KETONES UR: NEGATIVE mg/dL
Leukocytes, UA: NEGATIVE
NITRITE: POSITIVE — AB
PH: 6 (ref 5.0–8.0)
Protein, ur: NEGATIVE mg/dL
SPECIFIC GRAVITY, URINE: 1.024 (ref 1.005–1.030)

## 2015-03-30 LAB — URINE MICROSCOPIC-ADD ON

## 2015-03-30 LAB — I-STAT CG4 LACTIC ACID, ED: LACTIC ACID, VENOUS: 8.03 mmol/L — AB (ref 0.5–2.0)

## 2015-03-30 MED ORDER — SODIUM CHLORIDE 0.9 % IV BOLUS (SEPSIS): 1000.0000 mL | Freq: Once | INTRAVENOUS | Status: DC

## 2015-03-30 MED ORDER — SODIUM CHLORIDE 0.9 % IV BOLUS (SEPSIS)
1000.0000 mL | Freq: Once | INTRAVENOUS | Status: AC
  Administered 2015-03-30: 1000 mL via INTRAVENOUS

## 2015-03-30 MED ORDER — CEFTRIAXONE SODIUM 1 G IJ SOLR
1.0000 g | Freq: Once | INTRAMUSCULAR | Status: AC
  Administered 2015-03-30: 1 g via INTRAVENOUS
  Filled 2015-03-30: qty 10

## 2015-03-30 NOTE — ED Notes (Signed)
When Pt is asleep, her O2 sat is dropping to mid to high 80S.  Pt denies Hx of Sleep apnea, home O2 use or CPAP use.  RN educated Pt that she may want to talk to her provider about this and attempted to initiate 2L O2 via  while Pt in ED.  Pt refused this stating that she is "too sick to worry with this" and "I do not want any oxygen on me".

## 2015-03-30 NOTE — ED Notes (Signed)
Dr Ortiz at bedside 

## 2015-03-30 NOTE — ED Notes (Signed)
Port will draw back only a small amount of blood.  Port flushed well and is infusing.  Placement verified by IV team RN.  She was unable to draw blood also.

## 2015-03-30 NOTE — ED Provider Notes (Signed)
CSN: [id number]     Arrival date & time 03/30/15  1918 History   First MD Initiated Contact with Patient 03/30/15 1950     Chief Complaint  Patient presents with  . Weakness     (Consider location/radiation/quality/duration/timing/severity/associated sxs/prior Treatment) HPI  64 year old female presents with progressive weakness. Patient is chronically weak after being on chemotherapy and having had radiation to her neck. However it has acutely worsened to the point that she doesn't even want to get out of bed. She states she thinks she could get out of bed but has no energy. There is no focal weakness. No fevers. She has a chronic cough that is worsening over last couple days. Remains a dry cough. Does not feel short of breath. Has throat pain that has started since radiation and progressively worsened. Painful swallowing but she can swallow. Occasionally when swallowing she gets chest tightness but none now. Chronic abdominal pain is not worse than typical. Family states she has only urinated once today. No headaches. Constipation that is now intermittent diarrhea after starting on miralax.   Past Medical History  Diagnosis Date  . PONV (postoperative nausea and vomiting)   . Hypertension   . Hypothyroidism   . Arthritis   . Gout   . Neuromuscular disorder ([place])   . Hyperlipidemia   . Family history of anesthesia complication     '" MY OLDEST SON [place] ME "  . 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 Diverticulosis of large intestine without hemorrhage 03/05/2014  . S/P radiation therapy 01/29/15-02/11/15    C5-T10 30Gy/76fx  . Liver cancer [facility])    Past Surgical History  Procedure Laterality Date  . Cervical fusion  '99 & '03  . Rotator cuff surgery  '08    right  . Cataract extraction w/phaco  12/13/2010    Procedure: CATARACT EXTRACTION PHACO AND INTRAOCULAR LENS PLACEMENT (IOC);  Surgeon: Tonny Branch;  Location: [facility];  Service: Ophthalmology;  Laterality: Right;   CDE: 8.94  . Eye surgery    . Back surgery      2009  . Back surgery  2011    lumbar surgery  . Tubal ligation  1979  . Rotator cuff repair Left 04/09/2013    DR Marlou Sa  . Shoulder arthroscopy with subacromial decompression and open rotator c Left 04/09/2013    Procedure: SHOULDER ARTHROSCOPY WITH SUBACROMIAL DECOMPRESSION AND MINI OPEN ROTATOR CUFF REPAIR, POSSIBLE BICEP TENDONODESIS,  ;  Surgeon: Meredith Pel, MD;  Location: [place];  Service: Orthopedics;  Laterality: Left;   Family History  Problem Relation Age of Onset  . Anesthesia problems Neg Hx   . Hypotension Neg Hx   . Malignant hyperthermia Neg Hx   . Pseudochol deficiency Neg Hx   . Diabetes Sister   . Stroke Mother   . Heart disease Father   . Stroke Father   . Diabetes Brother   . Diabetes Sister   . Diabetes Sister   . Diabetes Sister    Social History  Substance Use Topics  . Smoking status: Never Smoker   . Smokeless tobacco: Never Used  . Alcohol Use: No   OB History    No data available     Review of Systems  Constitutional: Positive for fatigue. Negative for fever.  HENT: Positive for sore throat.   Respiratory: Positive for cough. Negative for shortness of breath.   Gastrointestinal: Positive for nausea and abdominal pain. Negative for vomiting.  Genitourinary: Positive for decreased  urine volume.  Neurological: Positive for weakness. Negative for dizziness and headaches.  All other systems reviewed and are negative.     Allergies  Livalo; Rosuvastatin calcium; Simvastatin; and Statins  Home Medications   Prior to Admission medications   Medication Sig Start Date End Date Taking? Authorizing Provider  allopurinol (ZYLOPRIM) 300 MG tablet TAKE 1 TABLET (300 MG TOTAL) BY MOUTH DAILY. 12/16/14  Yes [name] Hassell Done, FNP  dexamethasone (DECADRON) 0.1 % ophthalmic solution Place 1 drop into both eyes every 4 (four) hours. 03/13/15  Yes Verlee Monte, MD  dexamethasone (DECADRON) 0.5 MG/5ML  solution Take 5 cc BID. Patient taking differently: Take 0.5 mg by mouth 2 (two) times daily.  02/24/15  Yes Wyatt Portela, MD  docusate sodium (COLACE) 100 MG capsule Take 100 mg by mouth daily.   Yes Historical Provider, MD  HYDROcodone-acetaminophen (NORCO/VICODIN) 5-325 MG tablet Take 1 tablet by mouth every 6 (six) hours as needed for moderate pain. Pain. 03/13/15  Yes Historical Provider, MD  HYDROcodone-homatropine (HYCODAN) 5-1.5 MG/5ML syrup Take 5 mLs by mouth every 6 (six) hours as needed for cough. 03/18/15  Yes Susanne Borders, NP  polyethylene glycol (MIRALAX / GLYCOLAX) packet Take 17 g by mouth daily as needed for mild constipation.   Yes Historical Provider, MD  potassium chloride SA (K-DUR,KLOR-CON) 20 MEQ tablet Take 1 tablet (20 mEq total) by mouth 2 (two) times daily. 03/04/15  Yes Wyatt Portela, MD  sucralfate (CARAFATE) 1 G tablet Take 1 tablet (1 g total) by mouth 4 (four) times daily -  with meals and at bedtime. 03/04/15  Yes Susanne Borders, NP  SYNTHROID 88 MCG tablet TAKE 1 TABLET (88 MCG TOTAL) BY MOUTH DAILY BEFORE BREAKFAST. Patient taking differently: Take 1 tablet (88 mcg total) by mouth daily before breakfast. 02/26/15  Yes Fransisca Kaufmann Dettinger, MD  albuterol (PROVENTIL HFA;VENTOLIN HFA) 108 (90 BASE) MCG/ACT inhaler Inhale 2 puffs into the lungs every 6 (six) hours as needed for wheezing or shortness of breath. Patient not taking: Reported on 03/10/2015 01/14/15   Fransisca Kaufmann Dettinger, MD  amoxicillin-clavulanate (AUGMENTIN) 875-125 MG tablet Take 1 tablet by mouth 2 (two) times daily. Patient not taking: Reported on 03/20/2015 03/13/15   Verlee Monte, MD  fluconazole (DIFLUCAN) 100 MG tablet Take 1 tablet (100 mg total) by mouth daily. Patient not taking: Reported on 03/24/2015 03/04/15   Susanne Borders, NP  furosemide (LASIX) 20 MG tablet Take 1 tablet (20 mg total) by mouth daily. Patient not taking: Reported on 03/24/2015 03/14/15   Deno Etienne, DO   lidocaine-prilocaine (EMLA) cream Apply 1 application topically as needed. Apply cream, on days of treatment, to [facility] site 1-2 hours prior to treatment. 08/27/13   Wyatt Portela, MD  morphine (MSIR) 15 MG tablet Take 1 tablet (15 mg total) by mouth every 4 (four) hours as needed for severe pain. 02/24/15   Wyatt Portela, MD  morphine 10 MG/5ML solution Take 2.5-5 mLs (5-10 mg total) by mouth every 4 (four) hours as needed for severe pain. Patient not taking: Reported on 03/10/2015 02/21/15   Venetia Maxon Rama, MD  ondansetron (ZOFRAN) 8 MG tablet Take 1 tablet (8 mg total) by mouth every 8 (eight) hours as needed for nausea or vomiting. 02/24/15   Wyatt Portela, MD  ondansetron (ZOFRAN) 8 MG tablet Take 1 tablet (8 mg total) by mouth every 8 (eight) hours as needed for nausea or vomiting. Patient not taking: Reported on 03/24/2015  02/24/15   Wyatt Portela, MD  prochlorperazine (COMPAZINE) 10 MG tablet Take 1 tablet (10 mg total) by mouth every 6 (six) hours as needed for nausea or vomiting. Patient not taking: Reported on 03/30/2015 07/03/14   Burnetta Sabin E Johnson, PA-C  temazepam (RESTORIL) 15 MG capsule Take 1 capsule (15 mg total) by mouth at bedtime as needed for sleep. 03/04/15   Wyatt Portela, MD  trimethoprim-polymyxin b (POLYTRIM) ophthalmic solution Place 1 drop into both eyes every 4 (four) hours. Patient not taking: Reported on 03/20/2015 03/13/15   Verlee Monte, MD   Temp(Src) 98.8 F (37.1 C) (Oral) Physical Exam  Constitutional: She is oriented to person, place, and time. She appears well-developed and well-nourished.  HENT:  Head: Normocephalic and atraumatic.  Right Ear: External ear normal.  Left Ear: External ear normal.  Nose: Nose normal.  Mouth/Throat: Mucous membranes are dry.  Eyes: Right eye exhibits no discharge. Left eye exhibits no discharge.  Cardiovascular: Regular rhythm and normal heart sounds.   HR~100  Pulmonary/Chest: Effort normal. She has decreased breath  sounds in the right lower field.  Abdominal: Soft. She exhibits no distension. There is no tenderness.  Neurological: She is alert and oriented to person, place, and time.  Equal strength in upper and lower extremities, diffuse mild weakness. Is able to sit up with minimal help  Skin: Skin is warm and dry. There is pallor.  Nursing note and vitals reviewed.   ED Course  Procedures (including critical care time) Labs Review Labs Reviewed  COMPREHENSIVE METABOLIC PANEL - Abnormal; Notable for the following:    Glucose, Bld 108 (*)    BUN 21 (*)    Total Protein 5.1 (*)    Albumin 2.3 (*)    AST 63 (*)    Alkaline Phosphatase 281 (*)    All other components within normal limits  CBC WITH DIFFERENTIAL/PLATELET - Abnormal; Notable for the following:    WBC 18.6 (*)    RBC 3.70 (*)    RDW 21.4 (*)    Platelets 149 (*)    Neutro Abs 15.6 (*)    Monocytes Absolute 1.7 (*)    All other components within normal limits  URINALYSIS, ROUTINE W REFLEX MICROSCOPIC (NOT AT [facility]) - Abnormal; Notable for the following:    Color, Urine AMBER (*)    APPearance CLOUDY (*)    Hgb urine dipstick TRACE (*)    Nitrite POSITIVE (*)    All other components within normal limits  URINE MICROSCOPIC-ADD ON - Abnormal; Notable for the following:    Squamous Epithelial / LPF 0-5 (*)    Bacteria, UA MANY (*)    All other components within normal limits  I-STAT CG4 LACTIC ACID, ED - Abnormal; Notable for the following:    Lactic Acid, Venous 8.03 (*)    All other components within normal limits  URINE CULTURE    Imaging Review Dg Chest 2 View  03/30/2015  CLINICAL DATA:  64 year old female with increasing subacute cough. Abdominal pain. Patient with liver cancer. EXAM: CHEST  2 VIEW COMPARISON:  03/24/2015 and prior chest radiographs FINDINGS: The cardiomediastinal silhouette is unchanged. A right pleural effusion and right basilar atelectasis again noted. There is no evidence of pneumothorax. A right  Port-A-Cath is again identified with tip overlying the superior cavoatrial junction. Surgical hardware within the cervical spine again noted. IMPRESSION: Unchanged appearance of the chest with right pleural effusion and right lower lung atelectasis. Electronically Signed   By: Dellis Filbert  Hu M.D.   On: 03/30/2015 20:58   I have personally reviewed and evaluated these images and lab results as part of my medical decision-making.   EKG Interpretation   Date/Time:  Monday March 30 2015 23:29:19 EST Ventricular Rate:  93 PR Interval:  133 QRS Duration: 72 QT Interval:  367 QTC Calculation: 456 R Axis:   52 Text Interpretation:  Sinus rhythm Low voltage, extremity leads Borderline  repolarization abnormality no significant change since Mar 24 2015  Confirmed by Regenia Skeeter  MD, Evely Gainey [phone]) on 03/30/2015 11:42:37 PM      CRITICAL CARE Performed by: Sherwood Gambler T   Total critical care time: 45 minutes  Critical care time was exclusive of separately billable procedures and treating other patients.  Critical care was necessary to treat or prevent imminent or life-threatening deterioration.  Critical care was time spent personally by me on the following activities: development of treatment plan with patient and/or surrogate as well as nursing, discussions with consultants, evaluation of patient's response to treatment, examination of patient, obtaining history from patient or surrogate, ordering and performing treatments and interventions, ordering and review of laboratory studies, ordering and review of radiographic studies, pulse oximetry and re-evaluation of patient's condition.  MDM   Final diagnoses:  Lactic acidosis  Urinary tract infection, acute    Patient's workup shows significant lactic acidosis in the setting of generalized weakness as well as a urinary tract infection. She does have an elevated WBC of 18. I think her lactate of 8 is partially due to infection and  dehydration although I believe her liver disease is controlled being by not being able to clear the lactate fully. However given this, she was given IV antibiotics as well as aggressive IV fluids. She will need to be admitted to stepdown. There is no hypotension, altered mental status.    Sherwood Gambler, MD 03/30/15 [phone]

## 2015-03-30 NOTE — ED Notes (Signed)
Bed: [id number] Expected date:  Expected time:  Means of arrival:  Comments: 64 yo liver CA

## 2015-03-30 NOTE — ED Notes (Signed)
RN contacted [facility] for HCPOA paperwork for Pt.

## 2015-03-30 NOTE — ED Notes (Signed)
CA Pt from home with liver CA. Yesterday Pt lost appetite and stopped eating and drinking and became weak today. Pt c/o back and abd pain that is chronic in pain.  Pt took hydrocodone today that did not help.  Pt is to weak to ambulate which is a decline.

## 2015-03-30 NOTE — ED Notes (Signed)
RN attempted blood draw.  Phlebotomy bedside for blood draw also.

## 2015-03-31 ENCOUNTER — Inpatient Hospital Stay (HOSPITAL_COMMUNITY): Payer: Medicare Other

## 2015-03-31 DIAGNOSIS — E872 Acidosis, unspecified: Secondary | ICD-10-CM | POA: Diagnosis present

## 2015-03-31 DIAGNOSIS — R5081 Fever presenting with conditions classified elsewhere: Secondary | ICD-10-CM

## 2015-03-31 LAB — COMPREHENSIVE METABOLIC PANEL
ALK PHOS: 274 U/L — AB (ref 38–126)
ALT: 38 U/L (ref 14–54)
AST: 60 U/L — AB (ref 15–41)
Albumin: 2.2 g/dL — ABNORMAL LOW (ref 3.5–5.0)
Anion gap: 10 (ref 5–15)
BILIRUBIN TOTAL: 0.8 mg/dL (ref 0.3–1.2)
BUN: 19 mg/dL (ref 6–20)
CALCIUM: 8.6 mg/dL — AB (ref 8.9–10.3)
CO2: 21 mmol/L — ABNORMAL LOW (ref 22–32)
CREATININE: 0.58 mg/dL (ref 0.44–1.00)
Chloride: 107 mmol/L (ref 101–111)
GFR calc Af Amer: >60 mL/min (ref >60)
GLUCOSE: 86 mg/dL (ref 65–99)
POTASSIUM: 3.4 mmol/L — AB (ref 3.5–5.1)
Sodium: 138 mmol/L (ref 135–145)
TOTAL PROTEIN: 5.1 g/dL — AB (ref 6.5–8.1)

## 2015-03-31 LAB — LACTIC ACID, PLASMA
LACTIC ACID, VENOUS: 7 mmol/L — AB (ref 0.5–2.0)
Lactic Acid, Venous: 6.8 mmol/L (ref 0.5–2.0)
Lactic Acid, Venous: 5.3 mmol/L (ref 0.5–2.0)

## 2015-03-31 LAB — CBC WITH DIFFERENTIAL/PLATELET
BASOS ABS: 0 10*3/uL (ref 0.0–0.1)
Basophils Relative: 0 %
EOS ABS: 0.2 10*3/uL (ref 0.0–0.7)
Eosinophils Relative: 1 %
HCT: 36.1 % (ref 36.0–46.0)
Hemoglobin: 11.9 g/dL — ABNORMAL LOW (ref 12.0–15.0)
LYMPHS ABS: 1.1 10*3/uL (ref 0.7–4.0)
Lymphocytes Relative: 7 %
MCH: 31.7 pg (ref 26.0–34.0)
MCHC: 33 g/dL (ref 30.0–36.0)
MCV: 96.3 fL (ref 78.0–100.0)
MONO ABS: 1.3 10*3/uL — AB (ref 0.1–1.0)
MONOS PCT: 8 %
NEUTROS ABS: 13.4 10*3/uL — AB (ref 1.7–7.7)
Neutrophils Relative %: 84 %
PLATELETS: 134 10*3/uL — AB (ref 150–400)
RBC: 3.75 MIL/uL — AB (ref 3.87–5.11)
RDW: 21.3 % — AB (ref 11.5–15.5)
WBC: 16 10*3/uL — AB (ref 4.0–10.5)

## 2015-03-31 LAB — MRSA PCR SCREENING: MRSA BY PCR: NEGATIVE

## 2015-03-31 LAB — STREP PNEUMONIAE URINARY ANTIGEN: STREP PNEUMO URINARY ANTIGEN: NEGATIVE

## 2015-03-31 MED ORDER — HYDROCODONE-HOMATROPINE 5-1.5 MG/5ML PO SYRP
5.0000 mL | ORAL_SOLUTION | Freq: Four times a day (QID) | ORAL | Status: DC | PRN
  Administered 2015-03-31 – 2015-04-03 (×7): 5 mL via ORAL
  Filled 2015-03-31 (×7): qty 5

## 2015-03-31 MED ORDER — ENOXAPARIN SODIUM 40 MG/0.4ML ~~LOC~~ SOLN
40.0000 mg | SUBCUTANEOUS | Status: DC
Start: 2015-03-31 — End: 2015-04-01
  Administered 2015-03-31 – 2015-04-01 (×2): 40 mg via SUBCUTANEOUS
  Filled 2015-03-31 (×2): qty 0.4

## 2015-03-31 MED ORDER — ALLOPURINOL 300 MG PO TABS
300.0000 mg | ORAL_TABLET | Freq: Every day | ORAL | Status: DC
  Administered 2015-03-31 – 2015-04-01 (×2): 300 mg via ORAL
  Filled 2015-03-31: qty 1
  Filled 2015-03-31: qty 3
  Filled 2015-03-31: qty 1
  Filled 2015-03-31: qty 3

## 2015-03-31 MED ORDER — LEVOTHYROXINE SODIUM 88 MCG PO TABS
88.0000 ug | ORAL_TABLET | Freq: Every day | ORAL | Status: DC
  Administered 2015-03-31 – 2015-04-03 (×4): 88 ug via ORAL
  Filled 2015-03-31 (×5): qty 1

## 2015-03-31 MED ORDER — ACETAMINOPHEN 325 MG PO TABS: 650.0000 mg | ORAL_TABLET | Freq: Four times a day (QID) | ORAL | Status: DC | PRN

## 2015-03-31 MED ORDER — SUCRALFATE 1 G PO TABS
1.0000 g | ORAL_TABLET | Freq: Three times a day (TID) | ORAL | Status: DC
  Administered 2015-03-31 – 2015-04-03 (×11): 1 g via ORAL
  Filled 2015-03-31 (×17): qty 1

## 2015-03-31 MED ORDER — HYDROCODONE-ACETAMINOPHEN 5-325 MG PO TABS: 1.0000 | ORAL_TABLET | Freq: Four times a day (QID) | ORAL | Status: DC | PRN

## 2015-03-31 MED ORDER — DOCUSATE SODIUM 100 MG PO CAPS
100.0000 mg | ORAL_CAPSULE | Freq: Every day | ORAL | Status: DC
  Administered 2015-03-31 – 2015-04-02 (×3): 100 mg via ORAL
  Filled 2015-03-31 (×5): qty 1

## 2015-03-31 MED ORDER — ONDANSETRON HCL 4 MG PO TABS: 4.0000 mg | ORAL_TABLET | Freq: Four times a day (QID) | ORAL | Status: DC | PRN

## 2015-03-31 MED ORDER — CETYLPYRIDINIUM CHLORIDE 0.05 % MT LIQD
7.0000 mL | Freq: Two times a day (BID) | OROMUCOSAL | Status: DC
  Administered 2015-03-31 – 2015-04-01 (×4): 7 mL via OROMUCOSAL

## 2015-03-31 MED ORDER — DEXTROSE 5 % IV SOLN
2.0000 g | INTRAVENOUS | Status: DC
  Administered 2015-03-31 – 2015-04-03 (×4): 2 g via INTRAVENOUS
  Filled 2015-03-31 (×5): qty 2

## 2015-03-31 MED ORDER — ONDANSETRON HCL 4 MG/2ML IJ SOLN
4.0000 mg | Freq: Four times a day (QID) | INTRAMUSCULAR | Status: DC | PRN
  Administered 2015-04-02 – 2015-04-03 (×2): 4 mg via INTRAVENOUS
  Filled 2015-03-31 (×2): qty 2

## 2015-03-31 MED ORDER — SODIUM CHLORIDE 0.9 % IV SOLN
INTRAVENOUS | Status: DC
  Administered 2015-03-31 – 2015-04-02 (×4): via INTRAVENOUS

## 2015-03-31 MED ORDER — SODIUM CHLORIDE 0.9 % IV BOLUS (SEPSIS)
1000.0000 mL | Freq: Once | INTRAVENOUS | Status: AC
  Administered 2015-03-31: 1000 mL via INTRAVENOUS

## 2015-03-31 MED ORDER — ACETAMINOPHEN 650 MG RE SUPP: 650.0000 mg | Freq: Four times a day (QID) | RECTAL | Status: DC | PRN

## 2015-03-31 MED ORDER — DEXAMETHASONE SODIUM PHOSPHATE 0.1 % OP SOLN
1.0000 [drp] | OPHTHALMIC | Status: DC
  Administered 2015-03-31 – 2015-04-01 (×11): 1 [drp] via OPHTHALMIC
  Filled 2015-03-31 (×2): qty 5

## 2015-03-31 MED ORDER — POTASSIUM CHLORIDE CRYS ER 20 MEQ PO TBCR
20.0000 meq | EXTENDED_RELEASE_TABLET | Freq: Two times a day (BID) | ORAL | Status: DC
  Administered 2015-03-31 – 2015-04-02 (×6): 20 meq via ORAL
  Filled 2015-03-31 (×10): qty 1

## 2015-03-31 MED ORDER — NYSTATIN 100000 UNIT/ML MT SUSP
5.0000 mL | Freq: Four times a day (QID) | OROMUCOSAL | Status: DC
  Administered 2015-03-31 – 2015-04-02 (×8): 500000 [IU] via ORAL
  Filled 2015-03-31 (×14): qty 5

## 2015-03-31 MED ORDER — POLYETHYLENE GLYCOL 3350 17 G PO PACK: 17.0000 g | PACK | Freq: Every day | ORAL | Status: DC | PRN

## 2015-03-31 MED ORDER — TEMAZEPAM 15 MG PO CAPS: 15.0000 mg | ORAL_CAPSULE | Freq: Every evening | ORAL | Status: DC | PRN

## 2015-03-31 MED ORDER — MORPHINE SULFATE 15 MG PO TABS: 15.0000 mg | ORAL_TABLET | ORAL | Status: DC | PRN

## 2015-03-31 MED ORDER — LEVOFLOXACIN IN D5W 750 MG/150ML IV SOLN
750.0000 mg | Freq: Every day | INTRAVENOUS | Status: DC
  Administered 2015-03-31: 750 mg via INTRAVENOUS
  Filled 2015-03-31: qty 150

## 2015-03-31 MED ORDER — DEXAMETHASONE 0.5 MG PO TABS
0.5000 mg | ORAL_TABLET | Freq: Two times a day (BID) | ORAL | Status: DC
  Administered 2015-03-31 – 2015-04-01 (×4): 0.5 mg via ORAL
  Filled 2015-03-31 (×9): qty 1

## 2015-03-31 NOTE — Progress Notes (Signed)
Patient seen and evaluated earlier this a.m. by my associate. Please refer to H&P for details regarding assessment and plan.  Patient is currently on Rocephin  Gen.: Patient in no acute distress Cardiovascular: No cyanosis Pulmonary: No increased work of breathing, equal chest rise, no audible wheezes  We'll plan on reassessing next a.m.  Eward Rutigliano, [employer]

## 2015-03-31 NOTE — Care Management Note (Signed)
Case Management Note  Patient Details  Name: Lindsay Aguirre MRN: [id number] Date of Birth: 10-May-1950  Subjective/Objective:               sepsis    Action/Plan: Date: March 31, 2015 Chart reviewed for concurrent status and case management needs. Will continue to follow patient for changes and needs: Velva Harman, RN, BSN, [place]   [phone]  Expected Discharge Date:                  Expected Discharge Plan:  Home/Self Care  In-House Referral:  NA  Discharge planning Services  CM Consult  Post Acute Care Choice:  NA Choice offered to:  NA  DME Arranged:    DME Agency:     HH Arranged:    HH Agency:     Status of Service:  In process, will continue to follow  Medicare Important Message Given:    Date Medicare IM Given:    Medicare IM give by:    Date Additional Medicare IM Given:    Additional Medicare Important Message give by:     If discussed at [place] of Stay Meetings, dates discussed:    Additional Comments:  Leeroy Cha, RN 03/31/2015, 12:09 PM

## 2015-03-31 NOTE — Progress Notes (Signed)
CRITICAL VALUE ALERT  Critical value received:  Lactic acid- 7.0  Date of notification:  03/31/2015  Time of notification:  0252  Critical value read back:Yes.    Nurse who received alert:  Leanna Sato, RN   MD notified (1st page):  Raliegh Ip Schorr  Time of first page:  0257  Responding MD: Lamar Blinks  Time MD responded:  0300

## 2015-03-31 NOTE — Progress Notes (Signed)
CRITICAL VALUE ALERT  Critical value received:  Lactic acid 6.8  Date of notification:  03/31/2015  Time of notification: [id number]  Critical value read back:Yes.    Nurse who received alert:  Leanna Sato, RN  MD notified (1st page):  Raliegh Ip Schorr  Time of first page:  [phone]  Responding MD:  Lamar Blinks  Time MD responded:  [phone]

## 2015-03-31 NOTE — Progress Notes (Signed)
  Echocardiogram 2D Echocardiogram has been performed.  Tresa Res 03/31/2015, 12:14 PM

## 2015-03-31 NOTE — Progress Notes (Signed)
PHARMACY NOTE -  Dr. Olevia Bowens  Pharmacy has been assisting with dosing of ceftriaxone for UTI/sepsis. Dosage remains stable at Ceftriaxone 2gm iv q24hr and need for further dosage adjustment appears unlikely at present.    Will sign off at this time.  Please reconsult if a change in clinical status warrants re-evaluation of dosage.

## 2015-03-31 NOTE — H&P (Signed)
[facility] History and Physical  Lindsay Aguirre [id number] DOB: [birth year] DOA: 03/30/2015  Referring physician: Sherwood Aguirre, M.D. PCP: Lindsay Pretty, FNP   Chief Complaint: Weakness.  HPI: Lindsay Aguirre is a 64 y.o. 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 She states that she is chronically weak, but since Saturday her weakness, loss of appetite have worsened. She denies fever, chills, but feels very fatigued. Due to her decrease oral intake, per daughter she has only urinated once today, so they decided to bring her to the hospital.  Workup in the ER shows leukocytosis, pyuria on urinalysis and significant lactic acidosis consistent with sepsis due to UTI. She also has a chronic sore throat from radiation therapy and chronic cough. She was recently treated for candidal esophagitis. The patient was initially mildly tachycardic and mildly hypoxic on evaluation with an O2 sat of 91% on room air. Chest x-ray shows improved, but persistent chronic changes.    Review of Systems:  Constitutional:  Positive fatigue.  No weight loss, night sweats, Fevers, chills,  HEENT:  Occasional headaches,  Positive Difficulty swallowing and chronic Sore throat. No sneezing, itching, ear ache, nasal congestion, post nasal drip,  Cardio-vascular:  Frequent edema of lower extremities. Postural dizziness today No chest pain, Orthopnea, PND, anasarca,  Palpitations.  GI:  Positive abdominal pain, nausea, loss of appetite  No heartburn, indigestion,  vomiting, diarrhea, occasional constipation controlled with MiraLAX Resp:  Chronic shortness of breath, chronic cough usually nonproductive, denies wheezing and hemoptysis. Skin:  no rash or lesions.  GU:  Positive oliguria with  dark colored urine. no dysuria, no urgency or frequency. No flank pain.  Musculoskeletal:  Chronic cervical spine and lower back pain.  Psych:  No change in mood or affect. No depression or anxiety. No memory loss.   Past Medical History  Diagnosis Date  . PONV (postoperative nausea and vomiting)   . Hypertension   . Hypothyroidism   . Arthritis   . Gout   . Neuromuscular disorder ([place])   . Hyperlipidemia   . Family history of anesthesia complication     '" MY OLDEST SON [place] ME "  . Cholangiocarcinoma (Lindsay Aguirre) 08/05/2013  . Diverticulosis of large intestine without hemorrhage 03/05/2014  . S/P radiation therapy 01/29/15-02/11/15    C5-T10 30Gy/85fx  . Liver cancer Lindsay Aguirre)    Past Surgical History  Procedure Laterality Date  . Cervical fusion  '99 & '03  . Rotator cuff surgery  '08    right  . Cataract extraction w/phaco  12/13/2010    Procedure: CATARACT EXTRACTION PHACO AND INTRAOCULAR LENS PLACEMENT (IOC);  Surgeon: Lindsay Aguirre;  Location: [facility];  Service: Ophthalmology;  Laterality: Right;  CDE: 8.94  . Eye surgery    . Back surgery      2009  . Back surgery  2011    lumbar surgery  . Tubal ligation  1979  . Rotator cuff repair Left 04/09/2013    DR Lindsay Aguirre  . Shoulder arthroscopy with subacromial decompression and open rotator c Left 04/09/2013    Procedure: SHOULDER ARTHROSCOPY WITH SUBACROMIAL DECOMPRESSION AND MINI OPEN ROTATOR CUFF REPAIR, POSSIBLE BICEP TENDONODESIS,  ;  Surgeon: Lindsay Pel, MD;  Location: [facility];  Service: Orthopedics;  Laterality: Left;   Social History:  reports that she has never smoked. She has never used smokeless  tobacco. She reports that she does not drink alcohol or use illicit drugs.  Allergies  Allergen Reactions  . Livalo [Pitavastatin]     Leg cramps  . Rosuvastatin Calcium     Other reaction(s): Cramps (ALLERGY/intolerance) Leg Cramps   . Simvastatin     Leg cramps  . Statins     Leg Cramps     Family History    Problem Relation Age of Onset  . Anesthesia problems Neg Hx   . Hypotension Neg Hx   . Malignant hyperthermia Neg Hx   . Pseudochol deficiency Neg Hx   . Diabetes Sister   . Stroke Mother   . Heart disease Father   . Stroke Father   . Diabetes Brother   . Diabetes Sister   . Diabetes Sister   . Diabetes Sister     Prior to Admission medications   Medication Sig Start Date End Date Taking? Authorizing Provider  allopurinol (ZYLOPRIM) 300 MG tablet TAKE 1 TABLET (300 MG TOTAL) BY MOUTH DAILY. 12/16/14  Yes [name] Hassell Done, FNP  dexamethasone (DECADRON) 0.1 % ophthalmic solution Place 1 drop into both eyes every 4 (four) hours. 03/13/15  Yes Lindsay Monte, MD  dexamethasone (DECADRON) 0.5 MG/5ML solution Take 5 cc BID. Patient taking differently: Take 0.5 mg by mouth 2 (two) times daily.  02/24/15  Yes Lindsay Portela, MD  docusate sodium (COLACE) 100 MG capsule Take 100 mg by mouth daily.   Yes Historical Provider, MD  HYDROcodone-acetaminophen (NORCO/VICODIN) 5-325 MG tablet Take 1 tablet by mouth every 6 (six) hours as needed for moderate pain. Pain. 03/13/15  Yes Historical Provider, MD  HYDROcodone-homatropine (HYCODAN) 5-1.5 MG/5ML syrup Take 5 mLs by mouth every 6 (six) hours as needed for cough. 03/18/15  Yes Lindsay Borders, NP  polyethylene glycol (MIRALAX / GLYCOLAX) packet Take 17 g by mouth daily as needed for mild constipation.   Yes Historical Provider, MD  potassium chloride Aguirre (K-DUR,KLOR-CON) 20 MEQ tablet Take 1 tablet (20 mEq total) by mouth 2 (two) times daily. 03/04/15  Yes Lindsay Portela, MD  sucralfate (CARAFATE) 1 G tablet Take 1 tablet (1 g total) by mouth 4 (four) times daily -  with meals and at bedtime. 03/04/15  Yes Lindsay Borders, NP  SYNTHROID 88 MCG tablet TAKE 1 TABLET (88 MCG TOTAL) BY MOUTH DAILY BEFORE BREAKFAST. Patient taking differently: Take 1 tablet (88 mcg total) by mouth daily before breakfast. 02/26/15  Yes Lindsay Kaufmann Dettinger, MD  albuterol  (PROVENTIL HFA;VENTOLIN HFA) 108 (90 BASE) MCG/ACT inhaler Inhale 2 puffs into the lungs every 6 (six) hours as needed for wheezing or shortness of breath. Patient not taking: Reported on 03/10/2015 01/14/15   Lindsay Kaufmann Dettinger, MD  amoxicillin-clavulanate (AUGMENTIN) 875-125 MG tablet Take 1 tablet by mouth 2 (two) times daily. Patient not taking: Reported on 03/20/2015 03/13/15   Lindsay Monte, MD  fluconazole (DIFLUCAN) 100 MG tablet Take 1 tablet (100 mg total) by mouth daily. Patient not taking: Reported on 03/24/2015 03/04/15   Lindsay Borders, NP  furosemide (LASIX) 20 MG tablet Take 1 tablet (20 mg total) by mouth daily. Patient not taking: Reported on 03/24/2015 03/14/15   Deno Etienne, DO  lidocaine-prilocaine (EMLA) cream Apply 1 application topically as needed. Apply cream, on days of treatment, to [facility] site 1-2 hours prior to treatment. 08/27/13   Lindsay Portela, MD  morphine (MSIR) 15 MG tablet Take 1 tablet (15 mg total) by mouth every 4 (four) hours  as needed for severe pain. 02/24/15   Lindsay Portela, MD  morphine 10 MG/5ML solution Take 2.5-5 mLs (5-10 mg total) by mouth every 4 (four) hours as needed for severe pain. Patient not taking: Reported on 03/10/2015 02/21/15   Venetia Maxon Rama, MD  ondansetron (ZOFRAN) 8 MG tablet Take 1 tablet (8 mg total) by mouth every 8 (eight) hours as needed for nausea or vomiting. 02/24/15   Lindsay Portela, MD  ondansetron (ZOFRAN) 8 MG tablet Take 1 tablet (8 mg total) by mouth every 8 (eight) hours as needed for nausea or vomiting. Patient not taking: Reported on 03/24/2015 02/24/15   Lindsay Portela, MD  prochlorperazine (COMPAZINE) 10 MG tablet Take 1 tablet (10 mg total) by mouth every 6 (six) hours as needed for nausea or vomiting. Patient not taking: Reported on 03/30/2015 07/03/14   Burnetta Sabin E Johnson, PA-C  temazepam (RESTORIL) 15 MG capsule Take 1 capsule (15 mg total) by mouth at bedtime as needed for sleep. 03/04/15   Lindsay Portela, MD    trimethoprim-polymyxin b (POLYTRIM) ophthalmic solution Place 1 drop into both eyes every 4 (four) hours. Patient not taking: Reported on 03/20/2015 03/13/15   Lindsay Monte, MD   Physical Exam: Filed Vitals:   03/30/15 2218 03/30/15 2300 03/30/15 2330 03/31/15 0000  BP: 146/74 155/77 167/80 158/76  Pulse: 100 98 99 95  Temp: 98.5 F (36.9 C)     TempSrc: Temporal     Resp: 21 23 25 22   SpO2: 92% 94% 91% 96%    Wt Readings from Last 3 Encounters:  03/24/15 79.379 kg (175 lb)  03/20/15 77.021 kg (169 lb 12.8 oz)  03/13/15 84.5 kg (186 lb 4.6 oz)    General:  Appears chronically ill, but in no acute distress. Eyes: PERRL, normal lids, irises & conjunctiva ENT: grossly normal hearing, lips and oral mucosa are very dry. Neck: no LAD, masses or thyromegaly Cardiovascular: Regular rhythm, but tachycardic. no m/r/g. 1+ LE edema. Telemetry: Sinus tachycardia at 104 bpm. Respiratory: Decreased breath sounds and mild rhonchi bilaterally, mild rales on bases.  Abdomen: soft, ntnd Skin: no rash or induration seen on limited exam Musculoskeletal: grossly normal tone BUE/BLE Psychiatric: Mood is depressed, speech fluent and appropriate Neurologic: grossly non-focal.          Labs on Admission:  Basic Metabolic Panel:  Recent Labs Lab 03/24/15 1744 03/30/15 2245  NA 135 140  K 4.1 4.2  CL 103 106  CO2 23 22  GLUCOSE 100* 108*  BUN 18 21*  CREATININE 0.70 0.63  CALCIUM 8.7* 9.2   Liver Function Tests:  Recent Labs Lab 03/24/15 1744 03/30/15 2245  AST 51* 63*  ALT 31 35  ALKPHOS 170* 281*  BILITOT 0.6 1.1  PROT 4.9* 5.1*  ALBUMIN 2.2* 2.3*    Recent Labs Lab 03/24/15 1744  LIPASE 23  CBC:  Recent Labs Lab 03/24/15 1744 03/30/15 2245  WBC 12.4* 18.6*  NEUTROABS 10.4* 15.6*  HGB 11.4* 12.0  HCT 34.3* 36.0  MCV 95.0 97.3  PLT 152 149*   Cardiac Enzymes:  Recent Labs Lab 03/24/15 1744  TROPONINI <0.03    BNP (last 3 results)  Recent Labs   03/14/15 1950  BNP 76.4    Radiological Exams on Admission: Dg Chest 2 View  03/30/2015  CLINICAL DATA:  64 year old female with increasing subacute cough. Abdominal pain. Patient with liver cancer. EXAM: CHEST  2 VIEW COMPARISON:  03/24/2015 and prior chest radiographs FINDINGS: The  cardiomediastinal silhouette is unchanged. A right pleural effusion and right basilar atelectasis again noted. There is no evidence of pneumothorax. A right Port-A-Cath is again identified with tip overlying the superior cavoatrial junction. Surgical hardware within the cervical spine again noted. IMPRESSION: Unchanged appearance of the chest with right pleural effusion and right lower lung atelectasis. Electronically Signed   By: Margarette Canada M.D.   On: 03/30/2015 20:58    EKG: Independently reviewed.   Assessment/Plan Principal Problem:   Sepsis secondary to UTI [facility]) Admit to a stepdown for close monitoring. Continue careful IV hydration. Monitor input and output. Check echocardiogram in the morning. Continue IV ceftriaxone.     Lactic acidosis Continue IV antibiotics, IV fluids and supplemental oxygen. Follow-up lactic acid level in the morning.  Active Problems:   Hypertension Hold furosemide and monitor blood pressure.    Hypothyroidism Continue levothyroxine supplementation and monitor TSH periodically.    Gout Continue allopurinol 300 mg by mouth daily.    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 Continue current pain management.    Candidal esophagitis ([facility]) Resume oral nystatin, since the patient will be on bacterial antibiotics.    Hypoalbuminemia This might be the reason for the patient tendency to develop pitting edema of the lower extremities, but will check an echocardiogram to assess left ventricle function.    Cough Symptoms could possibly be as a result of radiation therapy, but since the patient is  already on ceftriaxone, I will add Levaquin to cover for possible bronchitis or pneumonia due to atypical microorganisms, since the patient has cough and abnormal chest x-ray.      Code Status: Full code. DVT Prophylaxis: Lovenox SQ. Family Communication: Disposition Plan:   Time spent: Over 70 minutes were spent during the process of this admission.  Reubin Milan [facility] Pager [phone].

## 2015-04-01 ENCOUNTER — Inpatient Hospital Stay (HOSPITAL_COMMUNITY): Payer: Medicare Other

## 2015-04-01 ENCOUNTER — Other Ambulatory Visit: Payer: Medicare Other

## 2015-04-01 ENCOUNTER — Ambulatory Visit: Payer: Medicare Other | Admitting: Oncology

## 2015-04-01 ENCOUNTER — Ambulatory Visit: Payer: Medicare Other

## 2015-04-01 DIAGNOSIS — Z515 Encounter for palliative care: Secondary | ICD-10-CM

## 2015-04-01 DIAGNOSIS — M1A09X0 Idiopathic chronic gout, multiple sites, without tophus (tophi): Secondary | ICD-10-CM

## 2015-04-01 DIAGNOSIS — E872 Acidosis: Secondary | ICD-10-CM

## 2015-04-01 DIAGNOSIS — R05 Cough: Secondary | ICD-10-CM

## 2015-04-01 DIAGNOSIS — C221 Intrahepatic bile duct carcinoma: Secondary | ICD-10-CM

## 2015-04-01 DIAGNOSIS — E039 Hypothyroidism, unspecified: Secondary | ICD-10-CM

## 2015-04-01 DIAGNOSIS — Z7189 Other specified counseling: Secondary | ICD-10-CM

## 2015-04-01 DIAGNOSIS — I10 Essential (primary) hypertension: Secondary | ICD-10-CM

## 2015-04-01 DIAGNOSIS — C7951 Secondary malignant neoplasm of bone: Secondary | ICD-10-CM

## 2015-04-01 DIAGNOSIS — C787 Secondary malignant neoplasm of liver and intrahepatic bile duct: Secondary | ICD-10-CM | POA: Insufficient documentation

## 2015-04-01 DIAGNOSIS — N390 Urinary tract infection, site not specified: Secondary | ICD-10-CM

## 2015-04-01 DIAGNOSIS — B3781 Candidal esophagitis: Secondary | ICD-10-CM

## 2015-04-01 DIAGNOSIS — A419 Sepsis, unspecified organism: Principal | ICD-10-CM

## 2015-04-01 LAB — COMPREHENSIVE METABOLIC PANEL
ALT: 35 U/L (ref 14–54)
ANION GAP: 9 (ref 5–15)
AST: 56 U/L — ABNORMAL HIGH (ref 15–41)
Albumin: 2.1 g/dL — ABNORMAL LOW (ref 3.5–5.0)
Alkaline Phosphatase: 255 U/L — ABNORMAL HIGH (ref 38–126)
BUN: 20 mg/dL (ref 6–20)
CALCIUM: 8.4 mg/dL — AB (ref 8.9–10.3)
CHLORIDE: 112 mmol/L — AB (ref 101–111)
CO2: 17 mmol/L — AB (ref 22–32)
CREATININE: 0.59 mg/dL (ref 0.44–1.00)
Glucose, Bld: 82 mg/dL (ref 65–99)
Potassium: 4 mmol/L (ref 3.5–5.1)
SODIUM: 138 mmol/L (ref 135–145)
Total Bilirubin: 0.9 mg/dL (ref 0.3–1.2)
Total Protein: 4.9 g/dL — ABNORMAL LOW (ref 6.5–8.1)

## 2015-04-01 LAB — CBC WITH DIFFERENTIAL/PLATELET
BASOS PCT: 0 %
Basophils Absolute: 0 10*3/uL (ref 0.0–0.1)
EOS ABS: 0.1 10*3/uL (ref 0.0–0.7)
EOS PCT: 1 %
HCT: 36.1 % (ref 36.0–46.0)
Hemoglobin: 12.3 g/dL (ref 12.0–15.0)
LYMPHS ABS: 1.3 10*3/uL (ref 0.7–4.0)
Lymphocytes Relative: 9 %
MCH: 32.5 pg (ref 26.0–34.0)
MCHC: 34.1 g/dL (ref 30.0–36.0)
MCV: 95.3 fL (ref 78.0–100.0)
MONO ABS: 1.8 10*3/uL — AB (ref 0.1–1.0)
Monocytes Relative: 12 %
NEUTROS ABS: 11.6 10*3/uL — AB (ref 1.7–7.7)
Neutrophils Relative %: 78 %
PLATELETS: 142 10*3/uL — AB (ref 150–400)
RBC: 3.79 MIL/uL — ABNORMAL LOW (ref 3.87–5.11)
RDW: 22.1 % — ABNORMAL HIGH (ref 11.5–15.5)
WBC: 14.8 10*3/uL — ABNORMAL HIGH (ref 4.0–10.5)

## 2015-04-01 LAB — LEGIONELLA PNEUMOPHILA SEROGP 1 UR AG: L. PNEUMOPHILA SEROGP 1 UR AG: NEGATIVE

## 2015-04-01 LAB — URINE CULTURE

## 2015-04-01 LAB — LACTIC ACID, PLASMA: LACTIC ACID, VENOUS: 4.9 mmol/L — AB (ref 0.5–2.0)

## 2015-04-01 MED ORDER — HYDRALAZINE HCL 20 MG/ML IJ SOLN: 10.0000 mg | Freq: Four times a day (QID) | INTRAMUSCULAR | Status: DC | PRN

## 2015-04-01 MED ORDER — DEXTROSE 5 % IV SOLN
500.0000 mg | INTRAVENOUS | Status: DC
  Administered 2015-04-01 – 2015-04-03 (×3): 500 mg via INTRAVENOUS
  Filled 2015-04-01 (×3): qty 500

## 2015-04-01 MED ORDER — LORAZEPAM 2 MG/ML IJ SOLN: 1.0000 mg | Freq: Four times a day (QID) | INTRAMUSCULAR | Status: DC | PRN

## 2015-04-01 MED ORDER — MORPHINE SULFATE (PF) 2 MG/ML IV SOLN
1.0000 mg | INTRAVENOUS | Status: DC | PRN
  Administered 2015-04-02 – 2015-04-03 (×2): 1 mg via INTRAVENOUS
  Filled 2015-04-01 (×2): qty 1

## 2015-04-01 NOTE — Progress Notes (Signed)
CRITICAL VALUE ALERT  Critical value received:  Lactic Acid 4.9  Date of notification:  04/01/2015  Time of notification:  0910  Critical value read back:Yes.    Nurse who received alert:  Chana Bode, RN  MD notified (1st page):  Mikhail   Time of first page:  0912  MD notified (2nd page):   Time of second page:  Responding MD:  Ree Kida  Time MD responded:  [phone]

## 2015-04-01 NOTE — Progress Notes (Signed)
[facility]                                                                              Patient Demographics  Lindsay Aguirre, is a 64 y.o. female, DOB - 1951/03/04, ZOX:[id number]  Admit date - 03/30/2015   Admitting Physician Bobette Mo, MD  Outpatient Primary MD for the patient is [name], FNP  LOS - 2   Chief Complaint  Patient presents with  . Weakness      HPI on 03/31/2015 by Dr. Ernestene Kiel Lindsay Aguirre is a 64 y.o. 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 She states that she is chronically weak, but since Saturday her weakness, loss of appetite have worsened. She denies fever, chills, but feels very fatigued. Due to her decrease oral intake, per daughter she has only urinated once today, so they decided to bring her to the hospital.  Workup in the ER shows leukocytosis, pyuria on urinalysis and significant lactic acidosis consistent with sepsis due to UTI. She also has a chronic sore throat from radiation therapy and chronic cough. She was recently treated for candidal esophagitis. The patient was initially mildly tachycardic and mildly hypoxic on evaluation with an O2 sat of 91% on room air. Chest x-ray shows improved, but persistent chronic changes.   Assessment & Plan   Sepsis secondary to urinary tract infection -Upon admission, patient had leukocytosis with tachycardia and elevated lactic acid -Continue IVF, antibiotics -Leukocytosis and lactic acidosis improving -Continue ceftriaxone -Will add azithromycin for possible bronchitis  Lactic acidosis -Improving, continue IV fluids and treatment as above  Essential Hypertension -Lasix initially held due to sepsis -Will add on hydralazine PRN  Hypothyroidism -Continue  Synthroid  Gout  -Continue allopurinol  Cholangiocarcinoma with osseous metastasis -Dr. Clelia Croft made aware of patient's admission- discussed palliative care consult, he agreed -Patient is questioning whether she should continue treatment for her cancer, would like to know all of her options. -Palliative care consulted and appreciated  Candidal esophagitis -Continue oral nystatin  Hypoalbuminemia with lower extremity edema -Echocardiogram showed 65-70%, grade 1 diastolic dysfunction, small pericardial effusion -We'll consult nutrition  Cough -Chest x-ray showed unchanged appearance of chest with right pleural effusion and right lower lobe atelectasis  -Will add on azithromycin for questionable bronchitis   Goals of care -Palliative care consulted and appreciated -Discussed CODE STATUS with patient she does rush to remain a full code at this time  Code Status: Full  Family Communication: None  Disposition Plan: Admitted. Continue to monitor in stepdown  Time Spent in minutes   30 minutes  Procedures  None  Consults   Oncology, Dr. Clelia Croft, via phone Palliative care  DVT Prophylaxis  Lovenox  Lab Results  Component Value Date   PLT 142* 04/01/2015    Medications  Scheduled Meds: . allopurinol  300 mg Oral Daily  . antiseptic oral rinse  7 mL Mouth Rinse BID  . azithromycin  500 mg Intravenous Q24H  . cefTRIAXone (ROCEPHIN)  IV  2 g Intravenous Q24H  . dexamethasone  1 drop  Both Eyes 6 times per day  . dexamethasone  0.5 mg Oral BID  . docusate sodium  100 mg Oral Daily  . enoxaparin (LOVENOX) injection  40 mg Subcutaneous Q24H  . levothyroxine  88 mcg Oral QAC breakfast  . nystatin  5 mL Oral QID  . potassium chloride SA  20 mEq Oral BID  . sodium chloride  1,000 mL Intravenous Once  . sodium chloride  1,000 mL Intravenous Once  . sucralfate  1 g Oral TID WC & HS   Continuous Infusions: . sodium chloride 100 mL/hr at 04/01/15 0800   PRN  Meds:.acetaminophen **OR** acetaminophen, HYDROcodone-acetaminophen, HYDROcodone-homatropine, morphine, ondansetron **OR** ondansetron (ZOFRAN) IV, polyethylene glycol, temazepam  Antibiotics    Anti-infectives    Start     Dose/Rate Route Frequency Ordered Stop   04/01/15 1000  azithromycin (ZITHROMAX) 500 mg in dextrose 5 % 250 mL IVPB     500 mg 250 mL/hr over 60 Minutes Intravenous Every 24 hours 04/01/15 0748     03/31/15 1000  cefTRIAXone (ROCEPHIN) 2 g in dextrose 5 % 50 mL IVPB     2 g 100 mL/hr over 30 Minutes Intravenous Every 24 hours 03/31/15 0109     03/31/15 0130  levofloxacin (LEVAQUIN) IVPB 750 mg  Status:  Discontinued     750 mg 100 mL/hr over 90 Minutes Intravenous Daily at bedtime 03/31/15 0109 03/31/15 0931   03/30/15 2300  cefTRIAXone (ROCEPHIN) 1 g in dextrose 5 % 50 mL IVPB     1 g 100 mL/hr over 30 Minutes Intravenous  Once 03/30/15 2251 03/30/15 2337      Subjective:   [name] seen and examined today.  Patient states she is feeling fired and weak. She questions whether she was to continue onward with cancer treatment. She denies any chest pain or shortness of breath at this time does continue to have a cough. Denies abdominal pain, nausea or vomiting at this time.   Objective:   Filed Vitals:   04/01/15 0600 04/01/15 0700 04/01/15 0800 04/01/15 0900  BP: 130/77 121/62 144/76 151/74  Pulse: 92 86 95 94  Temp:   97.8 F (36.6 C)   TempSrc:   Oral   Resp: 17 15 19 24   Height:      Weight:      SpO2: 95% 96% 96% 97%    Wt Readings from Last 3 Encounters:  04/01/15 82.4 kg (181 lb 10.5 oz)  03/24/15 79.379 kg (175 lb)  03/20/15 77.021 kg (169 lb 12.8 oz)     Intake/Output Summary (Last 24 hours) at 04/01/15 0940 Last data filed at 04/01/15 0900  Gross per 24 hour  Intake   2200 ml  Output    550 ml  Net   1650 ml    Exam  General: Well developed, chronically ill, NAD  HEENT: NCAT,  mucous membranes moist.   Cardiovascular: S1  S2 auscultated, no rubs, murmurs or gallops. Regular rate and rhythm.  Respiratory: Diminished breath sounds, mild rhonchi  Abdomen: Soft, nontender, nondistended, + bowel sounds  Extremities: warm dry without cyanosis clubbing, trace LE edema  Neuro: AAOx3, nonfocal  Psych: Depressed, tearful  Data Review   Micro Results Recent Results (from the past 240 hour(s))  MRSA PCR Screening     Status: None   Collection Time: 03/31/15  1:33 AM  Result Value Ref Range Status   MRSA by PCR NEGATIVE NEGATIVE Final    Comment:  The GeneXpert MRSA Assay (FDA approved for NASAL specimens only), is one component of a comprehensive MRSA colonization surveillance program. It is not intended to diagnose MRSA infection nor to guide or monitor treatment for MRSA infections.     Radiology Reports Dg Chest 2 View  03/30/2015  CLINICAL DATA:  64 year old female with increasing subacute cough. Abdominal pain. Patient with liver cancer. EXAM: CHEST  2 VIEW COMPARISON:  03/24/2015 and prior chest radiographs FINDINGS: The cardiomediastinal silhouette is unchanged. A right pleural effusion and right basilar atelectasis again noted. There is no evidence of pneumothorax. A right Port-A-Cath is again identified with tip overlying the superior cavoatrial junction. Surgical hardware within the cervical spine again noted. IMPRESSION: Unchanged appearance of the chest with right pleural effusion and right lower lung atelectasis. Electronically Signed   By: Harmon Pier M.D.   On: 03/30/2015 20:58   Dg Chest 2 View  03/24/2015  CLINICAL DATA:  Patient reports pain in ribs and in back. EXAM: CHEST  2 VIEW COMPARISON:  03/14/2015 FINDINGS: Right chest wall port a catheter is noted with tip in the cavoatrial junction. Normal heart size. Small pleural effusions are again noted. There is diminished aeration of both lung bases. IMPRESSION: 1. Small pleural effusions 2. Diminished aeration the lung bases which  may reflect atelectasis or airspace consolidation. Electronically Signed   By: Signa Kell M.D.   On: 03/24/2015 17:53   Dg Chest 2 View  03/14/2015  CLINICAL DATA:  Acute onset of bilateral lower extremity swelling. Recently hospitalized for sepsis. Initial encounter. EXAM: CHEST  2 VIEW COMPARISON:  Chest radiograph performed 03/10/2015 FINDINGS: Mild bibasilar opacities may reflect minimal interstitial edema or atelectasis. Small bilateral pleural effusions are noted on the lateral view. No pneumothorax is seen. The cardiomediastinal silhouette remains normal in size. A right-sided chest port is noted ending about the mid to distal SVC. No acute osseous abnormalities are seen. Cervical spinal fusion hardware is noted. IMPRESSION: Mild bibasilar airspace opacities may reflect minimal interstitial edema or atelectasis. Small bilateral pleural effusions noted. Electronically Signed   By: Roanna Raider M.D.   On: 03/14/2015 19:24   Dg Chest 2 View  03/10/2015  CLINICAL DATA:  Cough, congestion and shortness of breath. History of colon cancer and chemotherapy. EXAM: CHEST  2 VIEW COMPARISON:  01/14/2015 and chest CT 02/17/2015 FINDINGS: There is a right Port-A-Cath. Catheter tip near the superior cavoatrial junction. Again noted is surgical hardware in the cervical and upper thoracic spine. Slightly low lung volumes with patchy basilar densities. There is vascular fullness in the hilar regions. Heart size is normal. Negative for a pneumothorax. Trachea is midline. There is blunting at the left costophrenic angle and the lateral view demonstrates a small pleural effusion. Air-fluid level in the stomach. IMPRESSION: Slightly low lung volumes with vascular crowding in the hilar regions. Cannot exclude vascular congestion. Few patchy densities at the lung bases could represent atelectasis and suspect a new small left pleural effusion. Electronically Signed   By: Richarda Overlie M.D.   On: 03/10/2015 12:15   Dg Abd  1 View  03/24/2015  CLINICAL DATA:  Abdominal pain and constipation today. History of cholangiocarcinoma. EXAM: ABDOMEN - 1 VIEW COMPARISON:  CT abdomen and pelvis 03/10/2015 FINDINGS: Gas and stool seen throughout the colon. No small or large bowel distention. No radiopaque stones. Postoperative changes with posterior fixation and laminectomies from L3-L5. IMPRESSION: Normal nonobstructive bowel gas pattern. Electronically Signed   By: Marisa Cyphers.D.  On: 03/24/2015 20:33   Ct Head Wo Contrast  03/10/2015  CLINICAL DATA:  Generalized weakness and dizziness today. EXAM: CT HEAD WITHOUT CONTRAST TECHNIQUE: Contiguous axial images were obtained from the base of the skull through the vertex without intravenous contrast. COMPARISON:  None. FINDINGS: The ventricles are normal in size and configuration. No extra-axial fluid collections are identified. The gray-white differentiation is normal. No CT findings for acute intracranial process such as hemorrhage or infarction. No mass lesions. The brainstem and cerebellum are grossly normal. The bony structures are intact. The paranasal sinuses and mastoid air cells are clear. The globes are intact. IMPRESSION: Normal head CT. Electronically Signed   By: Rudie Meyer M.D.   On: 03/10/2015 21:56   Ct Abdomen Pelvis W Contrast  03/10/2015  CLINICAL DATA:  Weakness. History of colon carcinoma, post chemotherapy EXAM: CT ABDOMEN AND PELVIS WITH CONTRAST TECHNIQUE: Multidetector CT imaging of the abdomen and pelvis was performed using the standard protocol following bolus administration of intravenous contrast. CONTRAST:  50mL OMNIPAQUE IOHEXOL 300 MG/ML SOLN, OMNIPAQUE IOHEXOL 300 MG/ML SOLN COMPARISON:  02/17/2015 FINDINGS: New small bilateral pleural effusions with dependent atelectasis in the visualized lung bases. Interval decrease in pericardial effusion. Multiple large and confluent low-attenuation metastatic foci in the liver, nearly confluent  throughout much of the left lobe and segment 4. A small amount of abdominal and pelvic ascites has increased. New wedge-shaped perfusion defects in the spleen suggesting infarcts. Splenic vein is patent but there are multiple retroperitoneal collateral channels to the left renal vein as before. Unremarkable adrenal glands, kidneys, pancreas, gallbladder. No hydronephrosis. The stomach is non dilated. Small bowel and colon are nondilated. Fecal material distends the rectum. Urinary bladder physiologically distended. Intermediate attenuation left ovarian cystic lesion as before. No adenopathy. No free air. Mild aortoiliac arterial calcifications. Previous instrumented PLIF L3-L5. Poorly marginated sclerotic focus in T7 vertebral body as before. No new bone lesions identified. IMPRESSION: 1. New small pleural effusions and worsening abdominal ascites. 2. Multiple small splenic infarcts, new since prior exam. 3. Extensive hepatic metastatic disease as before. Electronically Signed   By: Corlis Leak M.D.   On: 03/10/2015 13:36   US Abdomen Limited  03/11/2015  CLINICAL DATA:  Suspected ascites, metastatic cholangiocarcinoma EXAM: LIMITED ABDOMEN ULTRASOUND FOR ASCITES TECHNIQUE: Limited ultrasound survey for ascites was performed in all four abdominal quadrants. COMPARISON:  Abdominal pelvic CT scan of March 10, 2015 FINDINGS: Multiple hepatic masses are observed. A small amount of ascites is present in the left upper quadrant. There is minimal ascites adjacent to the liver. There is a small right pleural effusion. IMPRESSION: There is ascites present but the volume is insufficient for successful paracentesis. Multiple hepatic masses are observed. Electronically Signed   By: David  [place] M.D.   On: 03/11/2015 09:02    CBC  Recent Labs Lab 03/30/15 2245 03/31/15 0138 04/01/15 0415  WBC 18.6* 16.0* 14.8*  HGB 12.0 11.9* 12.3  HCT 36.0 36.1 36.1  PLT 149* 134* 142*  MCV 97.3 96.3 95.3  MCH 32.4 31.7 32.5   MCHC 33.3 33.0 34.1  RDW 21.4* 21.3* 22.1*  LYMPHSABS 1.1 1.1 1.3  MONOABS 1.7* 1.3* 1.8*  EOSABS 0.2 0.2 0.1  BASOSABS 0.0 0.0 0.0    Chemistries   Recent Labs Lab 03/30/15 2245 03/31/15 0138 04/01/15 0552  NA 140 138 138  K 4.2 3.4* 4.0  CL 106 107 112*  CO2 22 21* 17*  GLUCOSE 108* 86 82  BUN 21* 19 20  CREATININE 0.63 0.58 0.59  CALCIUM 9.2 8.6* 8.4*  AST 63* 60* 56*  ALT 35 38 35  ALKPHOS 281* 274* 255*  BILITOT 1.1 0.8 0.9   ------------------------------------------------------------------------------------------------------------------ estimated creatinine clearance is 74.4 mL/min (by C-G formula based on Cr of 0.59). ------------------------------------------------------------------------------------------------------------------ No results for input(s): HGBA1C in the last 72 hours. ------------------------------------------------------------------------------------------------------------------ No results for input(s): CHOL, HDL, LDLCALC, TRIG, CHOLHDL, LDLDIRECT in the last 72 hours. ------------------------------------------------------------------------------------------------------------------ No results for input(s): TSH, T4TOTAL, T3FREE, THYROIDAB in the last 72 hours.  Invalid input(s): FREET3 ------------------------------------------------------------------------------------------------------------------ No results for input(s): VITAMINB12, FOLATE, FERRITIN, TIBC, IRON, RETICCTPCT in the last 72 hours.  Coagulation profile No results for input(s): INR, PROTIME in the last 168 hours.  No results for input(s): DDIMER in the last 72 hours.  Cardiac Enzymes No results for input(s): CKMB, TROPONINI, MYOGLOBIN in the last 168 hours.  Invalid input(s): CK ------------------------------------------------------------------------------------------------------------------ Invalid input(s): POCBNP    Purva Vessell D.O. on 04/01/2015 at 9:40  AM  Between 7am to 7pm - Pager - [phone]  After 7pm go to [email or web address] - password [email or web address]  And look for the night coverage person covering for me after hours  [facility] Office  [phone]

## 2015-04-01 NOTE — Progress Notes (Signed)
[facility] ICU medical director rounds  Patient: Lindsay Aguirre [id number]  After reviewing the chart and discussing the patient with nursing staff in the ICU, it does not appear that the patient has a condition that requires ICU or step down level of care.  In an effort to best utilize system resources I have written orders to transfer the patient to a non-ICU/SDU inpatient unit.  Roselie Awkward, MD [facility] Pager: [phone] Cell: [phone] After 3pm or if no response, call [phone]  04/01/2015@4 :54 PM

## 2015-04-01 NOTE — Progress Notes (Signed)
IP PROGRESS NOTE  Subjective:   Patient known to me with advanced cholangiocarcinoma and metastatic disease to the liver. She is currently receiving systemic chemotherapy for palliative purposes. She was admitted on 03/30/2015 with sepsis and failure to thrive.  Clinically, she continues to be rather weak and fatigued but certainly improving slowly. Her by mouth intake is still slow but is improving. He does not report any chest pain or difficulty breathing.  Objective:  Vital signs in last 24 hours: Temp:  [97.4 F (36.3 C)-98.1 F (36.7 C)] 97.8 F (36.6 C) (11/30 0800) Pulse Rate:  [86-112] 94 (11/30 0900) Resp:  [12-28] 24 (11/30 0900) BP: (121-158)/(62-80) 151/74 mmHg (11/30 0900) SpO2:  [95 %-97 %] 97 % (11/30 0900) Weight:  [181 lb 10.5 oz (82.4 kg)] 181 lb 10.5 oz (82.4 kg) (11/30 0400) Weight change: 9 lb 14.7 oz (4.5 kg) Last BM Date: 03/31/15  Intake/Output from previous day: 11/29 0701 - 11/30 0700 In: 2250 [I.V.:2200; IV Piggyback:50] Out: 750 [Urine:750] Chronically ill-appearing appeared in no Mouth: mucous membranes moist, pharynx normal without lesions Resp: clear to auscultation bilaterally Cardio: regular rate and rhythm, S1, S2 normal, no murmur, click, rub or gallop GI: soft, non-tender; bowel sounds normal; no masses,  no organomegaly Extremities: extremities normal, atraumatic, no cyanosis or edema    Lab Results:  Recent Labs  03/31/15 0138 04/01/15 0415  WBC 16.0* 14.8*  HGB 11.9* 12.3  HCT 36.1 36.1  PLT 134* 142*    BMET  Recent Labs  03/31/15 0138 04/01/15 0552  NA 138 138  K 3.4* 4.0  CL 107 112*  CO2 21* 17*  GLUCOSE 86 82  BUN 19 20  CREATININE 0.58 0.59  CALCIUM 8.6* 8.4*    Studies/Results: Dg Chest 2 View  03/30/2015  CLINICAL DATA:  64 year old female with increasing subacute cough. Abdominal pain. Patient with liver cancer. EXAM: CHEST  2 VIEW COMPARISON:  03/24/2015 and prior chest radiographs FINDINGS: The  cardiomediastinal silhouette is unchanged. A right pleural effusion and right basilar atelectasis again noted. There is no evidence of pneumothorax. A right Port-A-Cath is again identified with tip overlying the superior cavoatrial junction. Surgical hardware within the cervical spine again noted. IMPRESSION: Unchanged appearance of the chest with right pleural effusion and right lower lung atelectasis. Electronically Signed   By: Margarette Canada M.D.   On: 03/30/2015 20:58   Dg Chest Port 1 View  04/01/2015  CLINICAL DATA:  Follow-up pneumonia EXAM: PORTABLE CHEST 1 VIEW COMPARISON:  03/30/2015 chest radiograph. FINDINGS: Bilateral spinal fusion hardware overlies the lower cervical spine, partially visualized. Right internal jugular MediPort terminates at the cavoatrial junction. Stable cardiomediastinal silhouette with normal heart size. No pneumothorax. Moderate right pleural effusion, increased. No left pleural effusion. Patchy consolidation at the right lung base, increased. IMPRESSION: 1. Patchy right basilar lung consolidation, increased, likely a combination of pneumonia and atelectasis. 2. Moderate right pleural effusion, increased. Electronically Signed   By: Ilona Sorrel M.D.   On: 04/01/2015 10:09    Medications: I have reviewed the patient's current medications.  Assessment/Plan:  64 year old woman with the following issues:  1. Advanced adenocarcinoma of likely cholangiocarcinoma from a pancreatic or biliary origin. She received 2 cycles of chemotherapy and have resulted into hospitalizations the most recent chemotherapy was 03/20/2015. She presented with sepsis and failure to thrive.  The rationale of continuing chemotherapy was reviewed with the patient today. I feel that chemotherapy is offering very little palliation of her symptoms and likely worsening her  quality of life. I have recommended discontinuation of chemotherapy although she is resistant to the idea. The likelihood of  chemotherapy offering significant palliation of her symptoms is small at this time especially with her decline in her performance status and frequent hospitalizations.  I agree with palliative medicine evaluation and help in goals of care. If she wants to continue to be aggressive I have no objections to it as long as she improves from her current hospitalization and clinical status. I will not be treating her with systemic chemotherapy if her performance status declines further.  She is worried about her children's wishes as the urge her to continue on with chemotherapy. I explained to her that there is ultimately her decision and we'll be happy to discuss that with her children further needed to.  2. Urosepsis: He is on the appropriate treatment at this time and clinically slowly improving.  3. Follow-up: Will arrange for follow-up upon her discharge was she is clinically stable. Chemotherapy will be on hold until later date.   LOS: 2 days   Jkai Arwood 04/01/2015, 1:02 PM

## 2015-04-01 NOTE — Consult Note (Signed)
Consultation Note Date: 04/01/2015   Patient Name: Lindsay Aguirre  DOB: [birth year]  MRN: [id number]  Age / Sex: 64 y.o., female  PCP: Chevis Pretty, Lena Referring Physician: Cristal Ford, DO  Reason for Consultation: Establishing goals of care, Non pain symptom management, Pain control and Psychosocial/spiritual support    Clinical Assessment/Narrative:   Lindsay Aguirre is a 64 y.o. female with a past medical history of metastatic cholangiocarcinoma, hypertension, hypothyroidism, osteoarthritis, gout, hyperlipidemia who was admitted thru the ER  complaints of worsening weakness since Saturday. Patient reports continued physical and functional decline over the psst several months  Workup in the ER shows leukocytosis, pyuria on urinalysis and significant lactic acidosis consistent with sepsis due to UTI. She also has a chronic sore throat and dysphasia from radiation therapy and chronic cough. She was recently treated for candidal esophagitis.  Chest x-ray shows persistent chronic changes.   Patient is faced with advanced directive decisions and anticipatory care needs.   Patient was able to verbalize her desire to shift to full comfort.  "I am tired of all this". "If there is a bed at [facility], I'll go today".  She had a family member who was there.  She understands her limited prognosis and her hope is for comfort and dignity.  Family at bedside support her decision.   This NP Wadie Lessen reviewed medical records, received report from team, assessed the patient and then meet at the patient's bedside along with her daughter Margie Billet, Zenaida Niece and sister Parke Simmers  to discuss diagnosis, prognosis, GOC, EOL wishes disposition and options.   A  discussion was had today regarding advanced directives.  Concepts specific to code status, artifical feeding and hydration, continued IV antibiotics  and rehospitalization was had.  The difference between a aggressive medical intervention path  and a palliative comfort care path for this patient at this time was had.  Values and goals of care important to patient and family were attempted to be elicited.  Concept of Hospice and Palliative Care were discussed. Family is familiar with hospice benefit  Natural trajectory and expectations at EOL were discussed.  Questions and concerns addressed.  Hard Choices booklet left for review.    Family encouraged to call with questions or concerns.  PMT will continue to support holistically.  Primary Decision Maker: patient herself with support of her family  HCPOA: no    SUMMARY OF RECOMMENDATIONS  -DNR/DNI-comfort and dignity are focus of care -no further chemotherapy -hope is to secure a bed at [facility] -no escalation of care, minimize medications and intervetnions -continue fluids and  antibiotics until discharge then no further artificial hydration, antibiotics, diagnostics  Code Status/Advance Care Planning:  DNR      Code Status Orders        Start     Ordered   03/31/15 0110  Full code   Continuous     03/31/15 0109      Other Directives:None  Symptom Management:     Palliative Prophylaxis:   Bowel Regimen, Frequent Pain Assessment, Oral Care and Turn Reposition  Additional Recommendations (Limitations, Scope, Preferences):  Once discharged  Full Comfort Care, Minimize Medications, No Artificial Feeding, No Blood Transfusions, No Chemotherapy, No Diagnostics, No Glucose Monitoring, No Hemodialysis, No IV Antibiotics, No IV Fluids and No Lab Draws  Psycho-social/Spiritual:  Support System: Strong Desire for further Chaplaincy support:no-strong community church support Additional Recommendations: Education on Hospice  Prognosis: < 2 weeks  Discharge Planning: Hospice facility  Chief Complaint/ Primary Diagnoses: Present on Admission:   . Sepsis secondary to UTI ([place]) . Candidal esophagitis ([place]) . Gout . Hypertension . Hypothyroidism . Cough . Osseous metastasis ([place]) . Cholangiocarcinoma ([place]) . Lactic acidosis  I have reviewed the medical record, interviewed the patient and family, and examined the patient. The following aspects are pertinent.  Past Medical History  Diagnosis Date  . PONV (postoperative nausea and vomiting)   . Hypertension   . Hypothyroidism   . Arthritis   . Gout   . Neuromuscular disorder ([employer])   . Hyperlipidemia   . Family history of anesthesia complication     '" MY OLDEST SON [place] ME "  . Cholangiocarcinoma ([place]) 08/05/2013  . Diverticulosis of large intestine without hemorrhage 03/05/2014  . S/P radiation therapy 01/29/15-02/11/15    C5-T10 30Gy/67fx  . Liver cancer [facility])    Social History   Social History  . Marital Status: Divorced    Spouse Name: N/A  . Number of Children: 3  . Years of Education: 12   Occupational History  . Retired from [employer] work.    Social History Main Topics  . Smoking status: Never Smoker   . Smokeless tobacco: Never Used  . Alcohol Use: No  . Drug Use: No  . Sexual Activity: Not Asked   Other Topics Concern  . None   Social History Narrative   Divorced.  Lives at own home, oldest son lives with her.  Ambulates without assistance at baseline.  Has a walker but doesn't typically need it.   Family History  Problem Relation Age of Onset  . Anesthesia problems Neg Hx   . Hypotension Neg Hx   . Malignant hyperthermia Neg Hx   . Pseudochol deficiency Neg Hx   . Diabetes Sister   . Stroke Mother   . Heart disease Father   . Stroke Father   . Diabetes Brother   . Diabetes Sister   . Diabetes Sister   . Diabetes Sister    Scheduled Meds: . allopurinol  300 mg Oral Daily  . antiseptic oral rinse  7 mL Mouth Rinse BID  . azithromycin  500 mg Intravenous Q24H  . cefTRIAXone (ROCEPHIN)  IV  2 g Intravenous Q24H  .  dexamethasone  1 drop Both Eyes 6 times per day  . dexamethasone  0.5 mg Oral BID  . docusate sodium  100 mg Oral Daily  . enoxaparin (LOVENOX) injection  40 mg Subcutaneous Q24H  . levothyroxine  88 mcg Oral QAC breakfast  . nystatin  5 mL Oral QID  . potassium chloride SA  20 mEq Oral BID  . sodium chloride  1,000 mL Intravenous Once  . sodium chloride  1,000 mL Intravenous Once  . sucralfate  1 g Oral TID WC & HS   Continuous Infusions: . sodium chloride 100 mL/hr at 04/01/15 0800   PRN Meds:.acetaminophen **OR** acetaminophen, hydrALAZINE, HYDROcodone-acetaminophen, HYDROcodone-homatropine, morphine, ondansetron **OR** ondansetron (ZOFRAN) IV, polyethylene glycol, temazepam Medications Prior to Admission:  Prior to Admission medications   Medication Sig Start Date End Date Taking? Authorizing Provider  allopurinol (ZYLOPRIM) 300 MG tablet TAKE 1 TABLET (300 MG TOTAL) BY MOUTH DAILY. 12/16/14  Yes [name] Hassell Done, FNP  dexamethasone (DECADRON) 0.1 % ophthalmic solution Place 1 drop into both eyes every 4 (four) hours. 03/13/15  Yes Verlee Monte, MD  dexamethasone (DECADRON) 0.5 MG/5ML solution Take 5 cc BID. Patient taking differently: Take 0.5 mg by mouth 2 (two) times daily.  02/24/15  Yes Wyatt Portela, MD  docusate sodium (COLACE) 100 MG capsule Take 100 mg by mouth daily.   Yes Historical Provider, MD  HYDROcodone-acetaminophen (NORCO/VICODIN) 5-325 MG tablet Take 1 tablet by mouth every 6 (six) hours as needed for moderate pain. Pain. 03/13/15  Yes Historical Provider, MD  HYDROcodone-homatropine (HYCODAN) 5-1.5 MG/5ML syrup Take 5 mLs by mouth every 6 (six) hours as needed for cough. 03/18/15  Yes Susanne Borders, NP  polyethylene glycol (MIRALAX / GLYCOLAX) packet Take 17 g by mouth daily as needed for mild constipation.   Yes Historical Provider, MD  potassium chloride SA (K-DUR,KLOR-CON) 20 MEQ tablet Take 1 tablet (20 mEq total) by mouth 2 (two) times daily. 03/04/15  Yes  Wyatt Portela, MD  sucralfate (CARAFATE) 1 G tablet Take 1 tablet (1 g total) by mouth 4 (four) times daily -  with meals and at bedtime. 03/04/15  Yes Susanne Borders, NP  SYNTHROID 88 MCG tablet TAKE 1 TABLET (88 MCG TOTAL) BY MOUTH DAILY BEFORE BREAKFAST. Patient taking differently: Take 1 tablet (88 mcg total) by mouth daily before breakfast. 02/26/15  Yes Fransisca Kaufmann Dettinger, MD  albuterol (PROVENTIL HFA;VENTOLIN HFA) 108 (90 BASE) MCG/ACT inhaler Inhale 2 puffs into the lungs every 6 (six) hours as needed for wheezing or shortness of breath. Patient not taking: Reported on 03/10/2015 01/14/15   Fransisca Kaufmann Dettinger, MD  amoxicillin-clavulanate (AUGMENTIN) 875-125 MG tablet Take 1 tablet by mouth 2 (two) times daily. Patient not taking: Reported on 03/20/2015 03/13/15   Verlee Monte, MD  fluconazole (DIFLUCAN) 100 MG tablet Take 1 tablet (100 mg total) by mouth daily. Patient not taking: Reported on 03/24/2015 03/04/15   Susanne Borders, NP  furosemide (LASIX) 20 MG tablet Take 1 tablet (20 mg total) by mouth daily. Patient not taking: Reported on 03/24/2015 03/14/15   Deno Etienne, DO  lidocaine-prilocaine (EMLA) cream Apply 1 application topically as needed. Apply cream, on days of treatment, to [facility] site 1-2 hours prior to treatment. 08/27/13   Wyatt Portela, MD  morphine (MSIR) 15 MG tablet Take 1 tablet (15 mg total) by mouth every 4 (four) hours as needed for severe pain. 02/24/15   Wyatt Portela, MD  morphine 10 MG/5ML solution Take 2.5-5 mLs (5-10 mg total) by mouth every 4 (four) hours as needed for severe pain. Patient not taking: Reported on 03/10/2015 02/21/15   Venetia Maxon Rama, MD  ondansetron (ZOFRAN) 8 MG tablet Take 1 tablet (8 mg total) by mouth every 8 (eight) hours as needed for nausea or vomiting. 02/24/15   Wyatt Portela, MD  ondansetron (ZOFRAN) 8 MG tablet Take 1 tablet (8 mg total) by mouth every 8 (eight) hours as needed for nausea or vomiting. Patient not taking: Reported on  03/24/2015 02/24/15   Wyatt Portela, MD  prochlorperazine (COMPAZINE) 10 MG tablet Take 1 tablet (10 mg total) by mouth every 6 (six) hours as needed for nausea or vomiting. Patient not taking: Reported on 03/30/2015 07/03/14   Burnetta Sabin E Johnson, PA-C  temazepam (RESTORIL) 15 MG capsule Take 1 capsule (15 mg total) by mouth at bedtime as needed for sleep. 03/04/15   Wyatt Portela, MD  trimethoprim-polymyxin b (POLYTRIM) ophthalmic solution Place 1 drop into both eyes every 4 (four) hours. Patient not taking: Reported on 03/20/2015 03/13/15   Verlee Monte, MD   Allergies  Allergen Reactions  . Livalo [Pitavastatin]     Leg cramps  . Rosuvastatin Calcium  Other reaction(s): Cramps (ALLERGY/intolerance) Leg Cramps   . Simvastatin     Leg cramps  . Statins     Leg Cramps     Review of Systems  Constitutional: Positive for activity change, appetite change and fatigue.        I'm tired of all this"  Respiratory: Positive for shortness of breath.     Physical Exam  Constitutional: She is oriented to person, place, and time.  Ill appearing, alert and oriented  HENT:  Head: Normocephalic and atraumatic.  Cardiovascular: Normal rate, regular rhythm and normal heart sounds.   Respiratory:  Decreased BS in bases  Musculoskeletal:  Generalized weakness  Neurological: She is alert and oriented to person, place, and time.  Skin: Skin is warm and dry.    Vital Signs: BP 151/74 mmHg  Pulse 94  Temp(Src) 97.8 F (36.6 C) (Oral)  Resp 24  Ht 5\' 5"  (1.651 m)  Wt 82.4 kg (181 lb 10.5 oz)  BMI 30.23 kg/m2  SpO2 97%  SpO2: SpO2: 97 % O2 Device:SpO2: 97 % O2 Flow Rate: .O2 Flow Rate (L/min): 2 L/min  IO: Intake/output summary:  Intake/Output Summary (Last 24 hours) at 04/01/15 1000 Last data filed at 04/01/15 0900  Gross per 24 hour  Intake   2100 ml  Output    550 ml  Net   1550 ml    LBM: Last BM Date: 03/31/15 Baseline Weight: Weight: 77.9 kg (171 lb 11.8 oz) Most recent  weight: Weight: 82.4 kg (181 lb 10.5 oz)      Palliative Assessment/Data:    Additional Data Reviewed:  CBC:    Component Value Date/Time   WBC 14.8* 04/01/2015 0415   WBC 7.2 03/18/2015 1038   HGB 12.3 04/01/2015 0415   HGB 12.4 03/18/2015 1038   HCT 36.1 04/01/2015 0415   HCT 37.8 03/18/2015 1038   PLT 142* 04/01/2015 0415   PLT 252 03/18/2015 1038   MCV 95.3 04/01/2015 0415   MCV 96.5 03/18/2015 1038   NEUTROABS 11.6* 04/01/2015 0415   NEUTROABS 5.9 03/18/2015 1038   LYMPHSABS 1.3 04/01/2015 0415   LYMPHSABS 0.6* 03/18/2015 1038   MONOABS 1.8* 04/01/2015 0415   MONOABS 0.6 03/18/2015 1038   EOSABS 0.1 04/01/2015 0415   EOSABS 0.0 03/18/2015 1038   BASOSABS 0.0 04/01/2015 0415   BASOSABS 0.0 03/18/2015 1038   Comprehensive Metabolic Panel:    Component Value Date/Time   NA 138 04/01/2015 0552   NA 138 03/18/2015 1038   NA 141 07/10/2014 1306   K 4.0 04/01/2015 0552   K 4.2 03/18/2015 1038   CL 112* 04/01/2015 0552   CO2 17* 04/01/2015 0552   CO2 22 03/18/2015 1038   BUN 20 04/01/2015 0552   BUN 20.1 03/18/2015 1038   BUN 16 07/10/2014 1306   CREATININE 0.59 04/01/2015 0552   CREATININE 0.7 03/18/2015 1038   CREATININE 0.81 10/24/2012 1203   GLUCOSE 82 04/01/2015 0552   GLUCOSE 117 03/18/2015 1038   GLUCOSE 89 07/10/2014 1306   CALCIUM 8.4* 04/01/2015 0552   CALCIUM 9.6 03/18/2015 1038   AST 56* 04/01/2015 0552   AST 39* 03/18/2015 1038   ALT 35 04/01/2015 0552   ALT 28 03/18/2015 1038   ALKPHOS 255* 04/01/2015 0552   ALKPHOS 201* 03/18/2015 1038   BILITOT 0.9 04/01/2015 0552   BILITOT 0.74 03/18/2015 1038   BILITOT 0.4 07/10/2014 1306   PROT 4.9* 04/01/2015 0552   PROT 5.7* 03/18/2015 1038   PROT 7.0 07/10/2014  1306   ALBUMIN 2.1* 04/01/2015 0552   ALBUMIN 2.4* 03/18/2015 1038   ALBUMIN 4.2 07/10/2014 1306     Time In: 1330 Time Out: 1445 Time Total: 75 min Greater than 50%  of this time was spent counseling and coordinating care related to  the above assessment and plan.  Signed by: Wadie Lessen, NP  [place]ty, NP  04/01/2015, 10:00 AM  Please contact Palliative Medicine Team phone at [phone] for questions and concerns.

## 2015-04-02 DIAGNOSIS — C787 Secondary malignant neoplasm of liver and intrahepatic bile duct: Secondary | ICD-10-CM

## 2015-04-02 DIAGNOSIS — Z7189 Other specified counseling: Secondary | ICD-10-CM

## 2015-04-02 DIAGNOSIS — Z515 Encounter for palliative care: Secondary | ICD-10-CM

## 2015-04-02 MED ORDER — DEXAMETHASONE SODIUM PHOSPHATE 0.1 % OP SOLN
1.0000 [drp] | OPHTHALMIC | Status: DC
  Administered 2015-04-02 (×4): 1 [drp] via OPHTHALMIC

## 2015-04-02 MED ORDER — HYDROCODONE-HOMATROPINE 5-1.5 MG/5ML PO SYRP: 5.0000 mL | ORAL_SOLUTION | Freq: Four times a day (QID) | ORAL | Status: AC | PRN

## 2015-04-02 MED ORDER — FUROSEMIDE 10 MG/ML IJ SOLN
20.0000 mg | Freq: Once | INTRAMUSCULAR | Status: AC
  Administered 2015-04-02: 20 mg via INTRAVENOUS
  Filled 2015-04-02: qty 2

## 2015-04-02 MED ORDER — MORPHINE SULFATE 15 MG PO TABS: 15.0000 mg | ORAL_TABLET | ORAL | Status: AC | PRN

## 2015-04-02 MED ORDER — CETYLPYRIDINIUM CHLORIDE 0.05 % MT LIQD: 7.0000 mL | Freq: Two times a day (BID) | OROMUCOSAL | Status: AC

## 2015-04-02 NOTE — Discharge Summary (Addendum)
Physician Discharge Summary  Lindsay Aguirre [id number] DOB: March 15, 1951 DOA: 03/30/2015  PCP: Chevis Pretty, FNP  Admit date: 03/30/2015 Discharge date: 04/02/2015  Time spent: 45 minutes  Recommendations for Outpatient Follow-up:  Patient will be discharged to Hospice.  Patient should follow a regular diet. Patient may follow up with her primary care physician as needed.  Discharge Diagnoses:  Sepsis secondary to urinary tract infection vs pneumonia Lactic acidosis Essential hypertension Hypothyroidism Gout Cholangiocarcinoma with osseous metastasis Candidal esophagitis Hypoalbuminemia with lower extremity edema Cough Goals of care  Discharge Condition: Stable  Diet recommendation: Regular  Filed Weights   03/31/15 0100 04/01/15 0400  Weight: 77.9 kg (171 lb 11.8 oz) 82.4 kg (181 lb 10.5 oz)    History of present illness:  on 03/31/2015 by Dr. Gerri Lins Lindsay Aguirre is a 64 y.o. 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 She states that she is chronically weak, but since Saturday her weakness, loss of appetite have worsened. She denies fever, chills, but feels very fatigued. Due to her decrease oral intake, per daughter she has only urinated once today, so they decided to bring her to the hospital.  Workup in the ER shows leukocytosis, pyuria on urinalysis and significant lactic acidosis consistent with sepsis due to UTI. She also has a chronic sore throat from radiation therapy and chronic cough. She was recently treated for candidal esophagitis. The patient was initially mildly tachycardic and mildly hypoxic on evaluation with an O2 sat of 91% on room air. Chest x-ray shows improved, but persistent chronic changes.   Hospital Course:  Sepsis  secondary to urinary tract infection vs pneumonia -Upon admission, patient had leukocytosis with tachycardia and elevated lactic acid -Continue IVF, antibiotics -Leukocytosis and lactic acidosis improving -Urine culture showed multiple species -Blood culture show no growth to date -Was placed on azithromycin and ceftriaxone -Antibiotics discontinued upon discharge, as patient was transitioned to comfort care -CXR 11/30: patchy R basilar lung consolidation likely pna and atelectasis  Lactic acidosis -Improved with IVF  Essential Hypertension -Lasix initially held due to sepsis  Hypothyroidism -Continue Synthroid  Gout  -Continue allopurinol  Cholangiocarcinoma with osseous metastasis -Dr. Alen Blew made aware of patient's admission- discussed palliative care consult, he agreed -Patient is questioning whether she should continue treatment for her cancer, would like to know all of her options. -Palliative care consulted and appreciated  Candidal esophagitis -Continue oral nystatin  Hypoalbuminemia with lower extremity edema -Echocardiogram showed [id number], grade 1 diastolic dysfunction, small pericardial effusion -Nutrition consulted  Cough -Chest x-ray showed unchanged appearance of chest with right pleural effusion and right lower lobe atelectasis  -Azithromycin was added for questionable bronchitis  Goals of care/pain management -Palliative care consulted and appreciated -Patient was transitioned to comfort care and made DO NOT RESUSCITATE -Plan is for discharge to hospice -Patient requiring IV morphine along with PO hydrocodone PRN along with antiemetics. -Spoke with Dr. Raynelle Highland, director of [facility] in [place], agrees that patient would meet criteria for inpatient hospice at this time.   Code Status: DNR  Procedures  Echocardiogram  Consults  Oncology, Dr. Alen Blew, via phone Palliative care  Discharge Exam: Filed Vitals:   04/02/15 0039  04/02/15 0541  BP: 142/74 122/56  Pulse: 102 99  Temp: 97.7 F (36.5 C) 97.5 F (36.4 C)  Resp: 18 20   Exam  General: Well  developed, chronically ill, NAD  HEENT: NCAT, mucous membranes moist.   Cardiovascular: S1 S2 auscultated, RRR  Respiratory: Diminished breath sounds, mild rhonchi  Abdomen: Soft, RUQ TTP, nondistended, + bowel sounds  Extremities: warm dry without cyanosis clubbing, trace LE edema  Neuro: AAOx3, nonfocal  Psych: Depressed, tearful  Discharge Instructions    Medication List    ASK your doctor about these medications        albuterol 108 (90 BASE) MCG/ACT inhaler  Commonly known as:  PROVENTIL HFA;VENTOLIN HFA  Inhale 2 puffs into the lungs every 6 (six) hours as needed for wheezing or shortness of breath.     allopurinol 300 MG tablet  Commonly known as:  ZYLOPRIM  TAKE 1 TABLET (300 MG TOTAL) BY MOUTH DAILY.     amoxicillin-clavulanate 875-125 MG tablet  Commonly known as:  AUGMENTIN  Take 1 tablet by mouth 2 (two) times daily.     dexamethasone 0.1 % ophthalmic solution  Commonly known as:  DECADRON  Place 1 drop into both eyes every 4 (four) hours.     dexamethasone 0.5 MG/5ML solution  Commonly known as:  DECADRON  Take 5 cc BID.     docusate sodium 100 MG capsule  Commonly known as:  COLACE  Take 100 mg by mouth daily.     fluconazole 100 MG tablet  Commonly known as:  DIFLUCAN  Take 1 tablet (100 mg total) by mouth daily.     furosemide 20 MG tablet  Commonly known as:  LASIX  Take 1 tablet (20 mg total) by mouth daily.     HYDROcodone-acetaminophen 5-325 MG tablet  Commonly known as:  NORCO/VICODIN  Take 1 tablet by mouth every 6 (six) hours as needed for moderate pain. Pain.     HYDROcodone-homatropine 5-1.5 MG/5ML syrup  Commonly known as:  HYCODAN  Take 5 mLs by mouth every 6 (six) hours as needed for cough.     lidocaine-prilocaine cream  Commonly known as:  EMLA  Apply 1 application topically as needed.  Apply cream, on days of treatment, to [facility] site 1-2 hours prior to treatment.     morphine 10 MG/5ML solution  Take 2.5-5 mLs (5-10 mg total) by mouth every 4 (four) hours as needed for severe pain.     morphine 15 MG tablet  Commonly known as:  MSIR  Take 1 tablet (15 mg total) by mouth every 4 (four) hours as needed for severe pain.     ondansetron 8 MG tablet  Commonly known as:  ZOFRAN  Take 1 tablet (8 mg total) by mouth every 8 (eight) hours as needed for nausea or vomiting.     ondansetron 8 MG tablet  Commonly known as:  ZOFRAN  Take 1 tablet (8 mg total) by mouth every 8 (eight) hours as needed for nausea or vomiting.     polyethylene glycol packet  Commonly known as:  MIRALAX / GLYCOLAX  Take 17 g by mouth daily as needed for mild constipation.     potassium chloride SA 20 MEQ tablet  Commonly known as:  K-DUR,KLOR-CON  Take 1 tablet (20 mEq total) by mouth 2 (two) times daily.     prochlorperazine 10 MG tablet  Commonly known as:  COMPAZINE  Take 1 tablet (10 mg total) by mouth every 6 (six) hours as needed for nausea or vomiting.     sucralfate 1 G tablet  Commonly known as:  CARAFATE  Take 1 tablet (1 g total) by mouth 4 (  four) times daily -  with meals and at bedtime.     SYNTHROID 88 MCG tablet  Generic drug:  levothyroxine  TAKE 1 TABLET (88 MCG TOTAL) BY MOUTH DAILY BEFORE BREAKFAST.     temazepam 15 MG capsule  Commonly known as:  RESTORIL  Take 1 capsule (15 mg total) by mouth at bedtime as needed for sleep.     trimethoprim-polymyxin b ophthalmic solution  Commonly known as:  POLYTRIM  Place 1 drop into both eyes every 4 (four) hours.       Allergies  Allergen Reactions  . Livalo [Pitavastatin]     Leg cramps  . Rosuvastatin Calcium     Other reaction(s): Cramps (ALLERGY/intolerance) Leg Cramps   . Simvastatin     Leg cramps  . Statins     Leg Cramps       The results of significant diagnostics from this hospitalization (including  imaging, microbiology, ancillary and laboratory) are listed below for reference.    Significant Diagnostic Studies: Dg Chest 2 View  03/30/2015  CLINICAL DATA:  64 year old female with increasing subacute cough. Abdominal pain. Patient with liver cancer. EXAM: CHEST  2 VIEW COMPARISON:  03/24/2015 and prior chest radiographs FINDINGS: The cardiomediastinal silhouette is unchanged. A right pleural effusion and right basilar atelectasis again noted. There is no evidence of pneumothorax. A right Port-A-Cath is again identified with tip overlying the superior cavoatrial junction. Surgical hardware within the cervical spine again noted. IMPRESSION: Unchanged appearance of the chest with right pleural effusion and right lower lung atelectasis. Electronically Signed   By: Margarette Canada M.D.   On: 03/30/2015 20:58   Dg Chest 2 View  03/24/2015  CLINICAL DATA:  Patient reports pain in ribs and in back. EXAM: CHEST  2 VIEW COMPARISON:  03/14/2015 FINDINGS: Right chest wall port a catheter is noted with tip in the cavoatrial junction. Normal heart size. Small pleural effusions are again noted. There is diminished aeration of both lung bases. IMPRESSION: 1. Small pleural effusions 2. Diminished aeration the lung bases which may reflect atelectasis or airspace consolidation. Electronically Signed   By: Kerby Moors M.D.   On: 03/24/2015 17:53   Dg Chest 2 View  03/14/2015  CLINICAL DATA:  Acute onset of bilateral lower extremity swelling. Recently hospitalized for sepsis. Initial encounter. EXAM: CHEST  2 VIEW COMPARISON:  Chest radiograph performed 03/10/2015 FINDINGS: Mild bibasilar opacities may reflect minimal interstitial edema or atelectasis. Small bilateral pleural effusions are noted on the lateral view. No pneumothorax is seen. The cardiomediastinal silhouette remains normal in size. A right-sided chest port is noted ending about the mid to distal SVC. No acute osseous abnormalities are seen. Cervical  spinal fusion hardware is noted. IMPRESSION: Mild bibasilar airspace opacities may reflect minimal interstitial edema or atelectasis. Small bilateral pleural effusions noted. Electronically Signed   By: Garald Balding M.D.   On: 03/14/2015 19:24   Dg Chest 2 View  03/10/2015  CLINICAL DATA:  Cough, congestion and shortness of breath. History of colon cancer and chemotherapy. EXAM: CHEST  2 VIEW COMPARISON:  01/14/2015 and chest CT 02/17/2015 FINDINGS: There is a right Port-A-Cath. Catheter tip near the superior cavoatrial junction. Again noted is surgical hardware in the cervical and upper thoracic spine. Slightly low lung volumes with patchy basilar densities. There is vascular fullness in the hilar regions. Heart size is normal. Negative for a pneumothorax. Trachea is midline. There is blunting at the left costophrenic angle and the lateral view demonstrates a small  pleural effusion. Air-fluid level in the stomach. IMPRESSION: Slightly low lung volumes with vascular crowding in the hilar regions. Cannot exclude vascular congestion. Few patchy densities at the lung bases could represent atelectasis and suspect a new small left pleural effusion. Electronically Signed   By: Markus Daft M.D.   On: 03/10/2015 12:15   Dg Abd 1 View  03/24/2015  CLINICAL DATA:  Abdominal pain and constipation today. History of cholangiocarcinoma. EXAM: ABDOMEN - 1 VIEW COMPARISON:  CT abdomen and pelvis 03/10/2015 FINDINGS: Gas and stool seen throughout the colon. No small or large bowel distention. No radiopaque stones. Postoperative changes with posterior fixation and laminectomies from L3-L5. IMPRESSION: Normal nonobstructive bowel gas pattern. Electronically Signed   By: Lucienne Capers M.D.   On: 03/24/2015 20:33   Ct Head Wo Contrast  03/10/2015  CLINICAL DATA:  Generalized weakness and dizziness today. EXAM: CT HEAD WITHOUT CONTRAST TECHNIQUE: Contiguous axial images were obtained from the base of the skull through the  vertex without intravenous contrast. COMPARISON:  None. FINDINGS: The ventricles are normal in size and configuration. No extra-axial fluid collections are identified. The gray-white differentiation is normal. No CT findings for acute intracranial process such as hemorrhage or infarction. No mass lesions. The brainstem and cerebellum are grossly normal. The bony structures are intact. The paranasal sinuses and mastoid air cells are clear. The globes are intact. IMPRESSION: Normal head CT. Electronically Signed   By: Marijo Sanes M.D.   On: 03/10/2015 21:56   Ct Abdomen Pelvis W Contrast  03/10/2015  CLINICAL DATA:  Weakness. History of colon carcinoma, post chemotherapy EXAM: CT ABDOMEN AND PELVIS WITH CONTRAST TECHNIQUE: Multidetector CT imaging of the abdomen and pelvis was performed using the standard protocol following bolus administration of intravenous contrast. CONTRAST:  75mL OMNIPAQUE IOHEXOL 300 MG/ML SOLN, 150mL OMNIPAQUE IOHEXOL 300 MG/ML SOLN COMPARISON:  02/17/2015 FINDINGS: New small bilateral pleural effusions with dependent atelectasis in the visualized lung bases. Interval decrease in pericardial effusion. Multiple large and confluent low-attenuation metastatic foci in the liver, nearly confluent throughout much of the left lobe and segment 4. A small amount of abdominal and pelvic ascites has increased. New wedge-shaped perfusion defects in the spleen suggesting infarcts. Splenic vein is patent but there are multiple retroperitoneal collateral channels to the left renal vein as before. Unremarkable adrenal glands, kidneys, pancreas, gallbladder. No hydronephrosis. The stomach is non dilated. Small bowel and colon are nondilated. Fecal material distends the rectum. Urinary bladder physiologically distended. Intermediate attenuation left ovarian cystic lesion as before. No adenopathy. No free air. Mild aortoiliac arterial calcifications. Previous instrumented PLIF L3-L5. Poorly marginated  sclerotic focus in T7 vertebral body as before. No new bone lesions identified. IMPRESSION: 1. New small pleural effusions and worsening abdominal ascites. 2. Multiple small splenic infarcts, new since prior exam. 3. Extensive hepatic metastatic disease as before. Electronically Signed   By: Lucrezia Europe M.D.   On: 03/10/2015 13:36   US Abdomen Limited  03/11/2015  CLINICAL DATA:  Suspected ascites, metastatic cholangiocarcinoma EXAM: LIMITED ABDOMEN ULTRASOUND FOR ASCITES TECHNIQUE: Limited ultrasound survey for ascites was performed in all four abdominal quadrants. COMPARISON:  Abdominal pelvic CT scan of March 10, 2015 FINDINGS: Multiple hepatic masses are observed. A small amount of ascites is present in the left upper quadrant. There is minimal ascites adjacent to the liver. There is a small right pleural effusion. IMPRESSION: There is ascites present but the volume is insufficient for successful paracentesis. Multiple hepatic masses are observed. Electronically Signed  By: David  Martinique M.D.   On: 03/11/2015 09:02   Dg Chest Port 1 View  04/01/2015  CLINICAL DATA:  Follow-up pneumonia EXAM: PORTABLE CHEST 1 VIEW COMPARISON:  03/30/2015 chest radiograph. FINDINGS: Bilateral spinal fusion hardware overlies the lower cervical spine, partially visualized. Right internal jugular MediPort terminates at the cavoatrial junction. Stable cardiomediastinal silhouette with normal heart size. No pneumothorax. Moderate right pleural effusion, increased. No left pleural effusion. Patchy consolidation at the right lung base, increased. IMPRESSION: 1. Patchy right basilar lung consolidation, increased, likely a combination of pneumonia and atelectasis. 2. Moderate right pleural effusion, increased. Electronically Signed   By: Ilona Sorrel M.D.   On: 04/01/2015 10:09    Microbiology: Recent Results (from the past 240 hour(s))  Urine culture     Status: None   Collection Time: 03/30/15  9:34 PM  Result Value Ref  Range Status   Specimen Description URINE, CATHETERIZED  Final   Special Requests NONE  Final   Culture   Final    MULTIPLE SPECIES PRESENT, SUGGEST RECOLLECTION Performed at [facility]    Report Status 04/01/2015 FINAL  Final  MRSA PCR Screening     Status: None   Collection Time: 03/31/15  1:33 AM  Result Value Ref Range Status   MRSA by PCR NEGATIVE NEGATIVE Final    Comment:        The GeneXpert MRSA Assay (FDA approved for NASAL specimens only), is one component of a comprehensive MRSA colonization surveillance program. It is not intended to diagnose MRSA infection nor to guide or monitor treatment for MRSA infections.   Culture, blood (routine x 2) Call MD if unable to obtain prior to antibiotics being given     Status: None (Preliminary result)   Collection Time: 03/31/15  1:40 AM  Result Value Ref Range Status   Specimen Description BLOOD LEFT ARM  Final   Special Requests BOTTLES DRAWN AEROBIC AND ANAEROBIC 7ML  Final   Culture   Final    NO GROWTH 1 DAY Performed at [facility]    Report Status PENDING  Incomplete  Culture, blood (routine x 2) Call MD if unable to obtain prior to antibiotics being given     Status: None (Preliminary result)   Collection Time: 03/31/15  1:50 AM  Result Value Ref Range Status   Specimen Description BLOOD LEFT HAND  Final   Special Requests BOTTLES DRAWN AEROBIC AND ANAEROBIC 3ML  Final   Culture   Final    NO GROWTH 1 DAY Performed at [facility]    Report Status PENDING  Incomplete     Labs: Basic Metabolic Panel:  Recent Labs Lab 03/30/15 2245 03/31/15 0138 04/01/15 0552  NA 140 138 138  K 4.2 3.4* 4.0  CL 106 107 112*  CO2 22 21* 17*  GLUCOSE 108* 86 82  BUN 21* 19 20  CREATININE 0.63 0.58 0.59  CALCIUM 9.2 8.6* 8.4*   Liver Function Tests:  Recent Labs Lab 03/30/15 2245 03/31/15 0138 04/01/15 0552  AST 63* 60* 56*  ALT 35 38 35  ALKPHOS 281* 274* 255*  BILITOT 1.1 0.8 0.9    PROT 5.1* 5.1* 4.9*  ALBUMIN 2.3* 2.2* 2.1*   No results for input(s): LIPASE, AMYLASE in the last 168 hours. No results for input(s): AMMONIA in the last 168 hours. CBC:  Recent Labs Lab 03/30/15 2245 03/31/15 0138 04/01/15 0415  WBC 18.6* 16.0* 14.8*  NEUTROABS 15.6* 13.4* 11.6*  HGB 12.0  11.9* 12.3  HCT 36.0 36.1 36.1  MCV 97.3 96.3 95.3  PLT 149* 134* 142*   Cardiac Enzymes: No results for input(s): CKTOTAL, CKMB, CKMBINDEX, TROPONINI in the last 168 hours. BNP: BNP (last 3 results)  Recent Labs  03/14/15 1950  BNP 76.4    ProBNP (last 3 results) No results for input(s): PROBNP in the last 8760 hours.  CBG: No results for input(s): GLUCAP in the last 168 hours.   SignedCristal Ford  [facility] 04/02/2015, 11:22 AM

## 2015-04-02 NOTE — Progress Notes (Signed)
Referral made [facility]ospice home in [place] [facility]- faxed clinicals and have discussed with patient she request that I speak with her daughter- message left and await callback.  Eduard Clos, MSW, [place]

## 2015-04-02 NOTE — Progress Notes (Signed)
Physical Therapy Discharge Patient Details Name: ZAIDYN BARTUNEK MRN: [id number] DOB: Dec 14, 1950 Today's Date: 04/02/2015 Time:  -     Patient discharged from PT services secondary to medical decline - for comfort care/Hospice  GP     Marcelino Freestone PT [id number]  04/02/2015, 1:54 PM

## 2015-04-02 NOTE — Progress Notes (Signed)
Awaiting call back from [facility] home rep in regards to bed offer- will advise.  Eduard Clos, MSW, [place]

## 2015-04-02 NOTE — Progress Notes (Signed)
Nutrition Brief Note  Chart reviewed. Pt now transitioning to comfort care.  No nutrition interventions warranted at this time.  Please re-consult as needed.   Clayton Bibles, MS, RD, LDN Pager: [phone] After Hours Pager: [phone]

## 2015-04-02 NOTE — Care Management Important Message (Signed)
Important Message  Patient Details  Name: [name] MRN: [id number] Date of Birth: [birth year]   Medicare Important Message Given:  Yes    Camillo Flaming 04/02/2015, 12:22 [place] Message  Patient Details  Name: [name] MRN: [id number] Date of Birth: 09/30/1950   Medicare Important Message Given:  Yes    Camillo Flaming 04/02/2015, 12:22 PM

## 2015-04-02 NOTE — Discharge Instructions (Signed)
Hospice °Hospice is a service that is designed to provide people who are terminally ill and their families with medical, spiritual, and psychological support. Its aim is to improve your quality of life by keeping you as alert and comfortable as possible. Hospice is performed by a team of health care professionals and volunteers who: °· Help keep you comfortable. Hospice can be provided in your home or in a homelike setting. The hospice staff works with your family and friends to help meet your needs. You will enjoy the support of loved ones by receiving much of your basic care from family and friends. °· Provide pain relief and manage your symptoms. The staff supply all necessary medicines and equipment. °· Provide companionship when you are alone. °· Allow you and your family to rest. They may do light housekeeping, prepare meals, and run errands. °· Provide counseling. They will make sure your emotional, spiritual, and social needs and those of your family are being met. °· Provide spiritual care. Spiritual care is individualized to meet your needs and your family's needs. It may involve helping you look at what death means to you, say goodbye, or perform a specific religious ceremony or ritual. °Hospice teams often include: °· A nurse. °· A doctor. °· Social workers. °· Religious leaders (such as a chaplain). °· Trained volunteers. °WHEN SHOULD HOSPICE CARE BEGIN? °Most people who use hospice are believed to have fewer than 6 months to live. Your family and health care providers can help you decide when hospice services should begin. If your condition improves, you may discontinue the program. °WHAT SHOULD I CONSIDER BEFORE SELECTING A PROGRAM? °Most hospice programs are run by nonprofit, independent organizations. Some are affiliated with hospitals, nursing homes, or home health care agencies. Hospice programs can take place in the home or at a hospice center, hospital, or skilled nursing facility. When choosing  a hospice program, ask the following questions: °· What services are available to me? °· What services are offered to my loved ones? °· How involved are my loved ones? °· How involved is my health care provider? °· Who makes up the hospice care team? How are they trained or screened? °· How will my pain and symptoms be managed? °· If my circumstances change, can the services be provided in a different setting, such as my home or in the hospital? °· Is the program reviewed and licensed by the state or certified in some other way? °WHERE CAN I LEARN MORE ABOUT HOSPICE? °You can learn about existing hospice programs in your area from your health care providers. You can also read more about hospice online. The websites of the following organizations contain helpful information: °· The National Hospice and Palliative Care Organization (NHPCO). °· The Hospice Association of America (HAA). °· The Hospice Education Institute. °· The American Cancer Society (ACS). °· Hospice Net. °  °This information is not intended to replace advice given to you by your health care provider. Make sure you discuss any questions you have with your health care provider. °  °Document Released: 08/05/2003 Document Revised: 04/23/2013 Document Reviewed: 02/26/2013 °Elsevier Interactive Patient Education ©2016 Elsevier Inc. ° °

## 2015-04-03 DIAGNOSIS — G893 Neoplasm related pain (acute) (chronic): Secondary | ICD-10-CM

## 2015-04-03 MED ORDER — HEPARIN SOD (PORK) LOCK FLUSH 100 UNIT/ML IV SOLN
500.0000 [IU] | INTRAVENOUS | Status: DC | PRN
  Administered 2015-04-03: 500 [IU]
  Filled 2015-04-03: qty 5

## 2015-04-03 NOTE — Progress Notes (Signed)
Please see full discharge summary dictated on 04/02/2015.  No changes made.  Patient seen and examined this morning.  Patient is stable to for discharge to inpatient hospice.    Time spent: 10 minutes  Worley Radermacher D.O. [facility] Pager [phone]  If 7PM-7AM, please contact night-coverage [email or web address] Password [facility] 04/03/2015, 9:37 AM

## 2015-04-03 NOTE — Progress Notes (Signed)
Patient for d/c today to [facility] in [place]. Daughter, Timpy, and patient agreeable to this plan- patient appears comfortable and at ease with plans. Will plan transfer via EMS. Eduard Clos, MSW, [place]

## 2015-04-03 NOTE — Progress Notes (Signed)
Daily Progress Note   Patient Name: Lindsay Aguirre       Date: 04/03/2015 DOB: [birth year]  Age: 64 y.o. MRN#: [id number] Attending Physician: Cristal Ford, DO Primary Care Physician: Chevis Pretty, FNP Admit Date: 03/30/2015  Reason for Consultation/Follow-up: Establishing goals of care, Inpatient hospice referral, Non pain symptom management, Pain control and Psychosocial/spiritual support  Subjective:     -continued physical decline.  Patient is weak with little po intake. -patient and family are hopeful for hospice facility   Length of Stay: 4 days  Current Medications: Scheduled Meds:  . allopurinol  300 mg Oral Daily  . antiseptic oral rinse  7 mL Mouth Rinse BID  . azithromycin  500 mg Intravenous Q24H  . cefTRIAXone (ROCEPHIN)  IV  2 g Intravenous Q24H  . dexamethasone  1 drop Both Eyes 6 times per day  . dexamethasone  0.5 mg Oral BID  . docusate sodium  100 mg Oral Daily  . levothyroxine  88 mcg Oral QAC breakfast  . nystatin  5 mL Oral QID  . potassium chloride SA  20 mEq Oral BID  . sodium chloride  1,000 mL Intravenous Once  . sodium chloride  1,000 mL Intravenous Once  . sucralfate  1 g Oral TID WC & HS    Continuous Infusions: . sodium chloride 100 mL/hr at 04/02/15 2043    PRN Meds: acetaminophen **OR** acetaminophen, hydrALAZINE, HYDROcodone-homatropine, LORazepam, morphine injection, ondansetron **OR** ondansetron (ZOFRAN) IV, polyethylene glycol, temazepam  Palliative Performance Scale: 20 % at best     Vital Signs: BP 138/85 mmHg  Pulse 107  Temp(Src) 97.9 F (36.6 C) (Oral)  Resp 18  Ht 5\' 5"  (1.651 m)  Wt 82.4 kg (181 lb 10.5 oz)  BMI 30.23 kg/m2  SpO2 94% SpO2: SpO2: 94 % O2 Device: O2 Device: Nasal Cannula O2 Flow Rate: O2 Flow Rate (L/min): 2 L/min  Intake/output summary:  Intake/Output Summary (Last 24 hours) at 04/03/15 0915 Last data filed at 04/03/15 0800  Gross per 24 hour  Intake   2650 ml  Output   1600 ml    Net   1050 ml   LBM: Last BM Date: 04/30/15 Baseline Weight: Weight: 77.9 kg (171 lb 11.8 oz) Most recent weight: Weight: 82.4 kg (181 lb 10.5 oz)  Physical Exam:            General: chronically ill appearing HEENT: dry buccal membranes CVS: tachycardic Resp: decreased in bases Abd: tender to gentle touch  Skin: warm and dry Neuro: alert and oriented    Additional Data Reviewed: Recent Labs     04/01/15  0415  04/01/15  0552  WBC  14.8*   --   HGB  12.3   --   PLT  142*   --   NA   --   138  BUN   --   20  CREATININE   --   0.59     Problem List:  Patient Active Problem List   Diagnosis Date Noted  . Palliative care encounter 04/01/2015  . DNR (do not resuscitate) discussion 04/01/2015  . Cancer, metastatic to liver ([place])   . Lactic acidosis 03/31/2015  . Sepsis secondary to UTI ([place]) 03/30/2015  . Dizziness   . Sepsis ([place]): ??rule out 03/10/2015  . Cough 03/10/2015  . Ascites 03/10/2015  . Otitis media of right ear 03/10/2015  . Splenic infarct   . Candidal esophagitis ([place]) 03/05/2015  . Edema 03/05/2015  . Hypoalbuminemia due  to protein-calorie malnutrition ([place]) 03/05/2015  . Hyperphosphatemia 03/05/2015  . Dysphagia 02/19/2015  . Dehydration secondary to dysphagia in the setting of cervical spine metastasis 02/19/2015  . Hypokalemia 02/19/2015  . 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 Dysphasia 02/19/2015  . 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 Hypertension 10/24/2012  . Hyperlipidemia 10/24/2012  . Hypothyroidism 10/24/2012  . Gout 10/24/2012  . Spondylolisthesis, grade 2 03/31/2011     Palliative Care Assessment & Plan    Code Status:  DNR  Goals of Care:  Focus of care is comfort   Desire for further Chaplaincy support:no-strong community church support   Symptom Management:  Pain/Dyspnea: Morphine 1 mg IV every one hr prn   Anxiety:   Ativan 1 mg IV every 6 hrs prn   Prognosis: < 2 weeks   Discharge Planning: Hospice facility   Care plan was discussed with Dr Ree Kida  Thank you for allowing the Palliative Medicine Team to assist in the care of this patient.   Time In: 0800 Time Out: 0825 Total Time 25 min Prolonged Time Billed  no     Greater than 50%  of this time was spent counseling and coordinating care related to the above assessment and plan.     Knox Royalty, NP  04/03/2015, 9:15 AM  Please contact Palliative Medicine Team phone at [phone] for questions and concerns.

## 2015-04-03 NOTE — Progress Notes (Signed)
Patient refused all  Po medications  Including eye drops except Hycodan.

## 2015-04-05 LAB — CULTURE, BLOOD (ROUTINE X 2)
CULTURE: NO GROWTH
Culture: NO GROWTH

## 2015-04-06 DIAGNOSIS — G893 Neoplasm related pain (acute) (chronic): Secondary | ICD-10-CM | POA: Insufficient documentation

## 2015-05-03 DEATH — deceased

## 2015-10-16 ENCOUNTER — Other Ambulatory Visit: Payer: Self-pay | Admitting: Nurse Practitioner

## 2017-02-03 IMAGING — CT CT HEAD W/O CM
1 of 2 series · 16 of 30 positions shown, 20 images · non-contrast
Comparison: None.

CLINICAL DATA: Generalized weakness and dizziness today.

EXAM:
CT HEAD WITHOUT CONTRAST
TECHNIQUE: Contiguous axial images were obtained from the base of the skull
through the vertex without intravenous contrast.

[Series 2: headseq 4.8 h45s · axial · 0.41mm/px · z∈[+1226,+1378]mm · 16 of 36 slices shown, 20 images]
[im 2/36  brain]
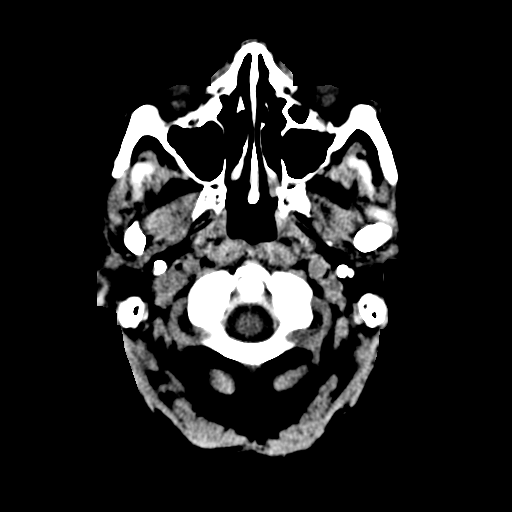
[im 2/36  bone]
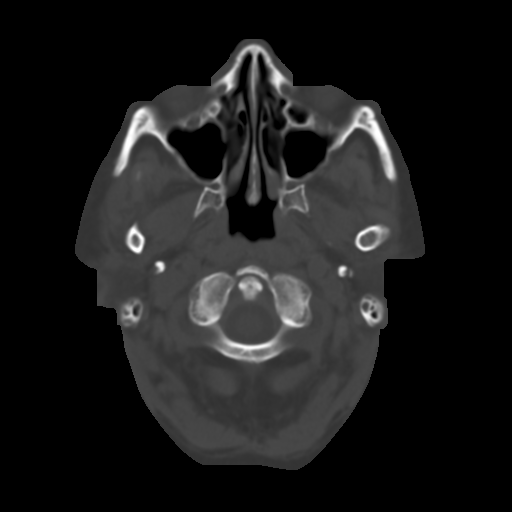
[im 4/36  brain]
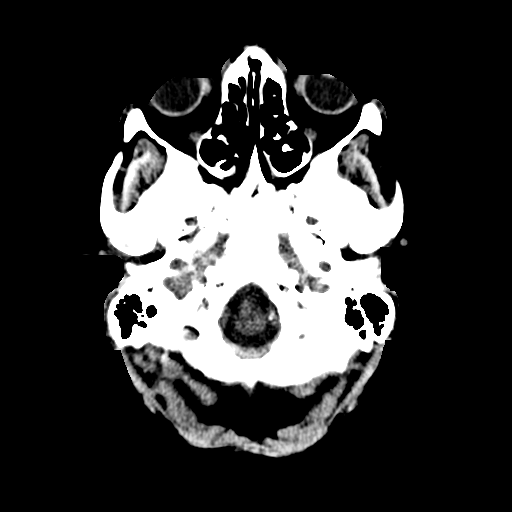
[im 7/36  brain]
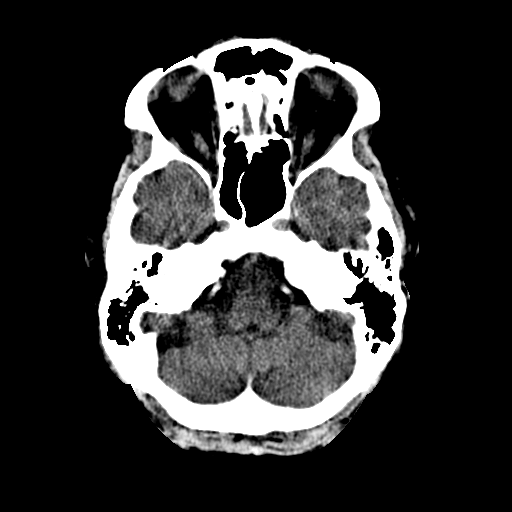
[im 9/36  brain]
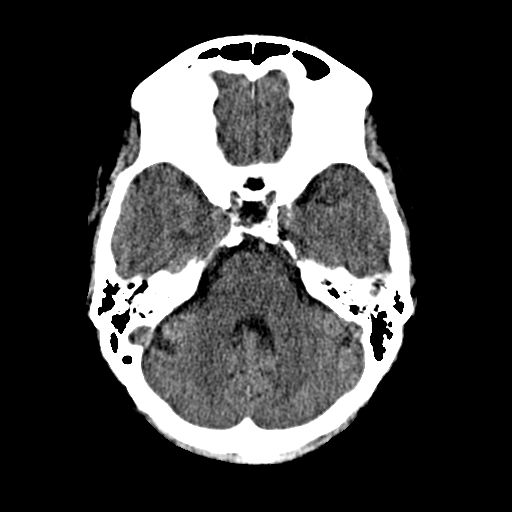
[im 11/36  brain]
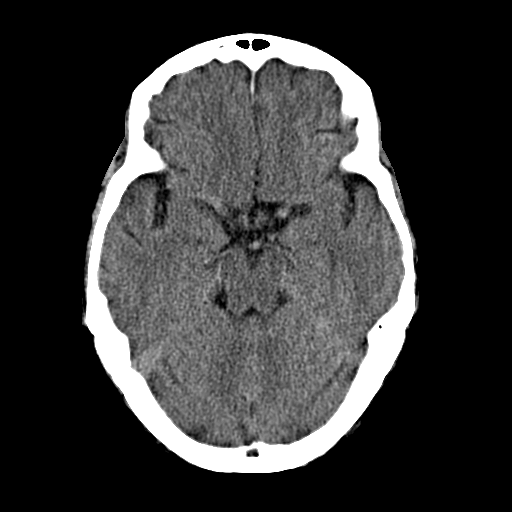
[im 11/36  bone]
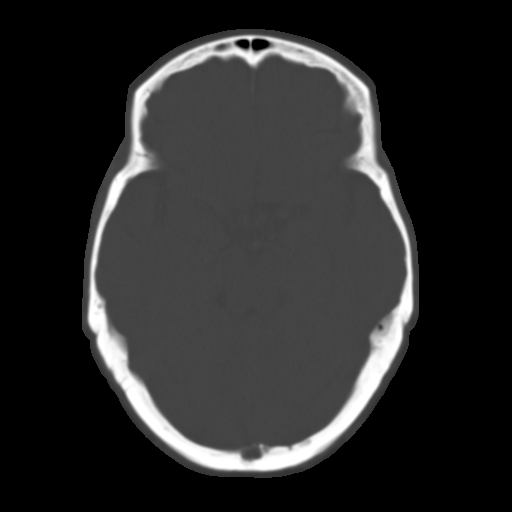
[im 12/36  brain]
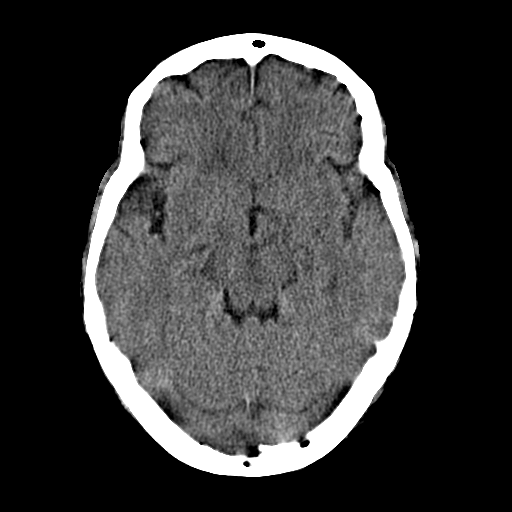
[im 16/36  brain]
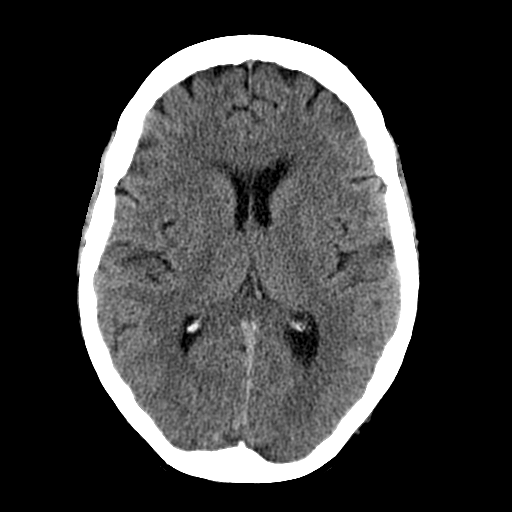
[im 17/36  brain]
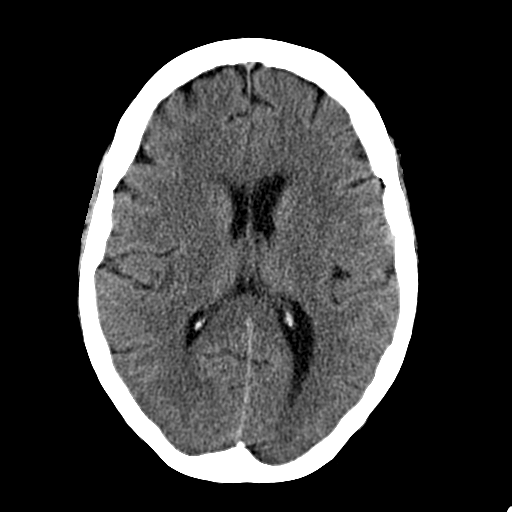
[im 19/36  brain]
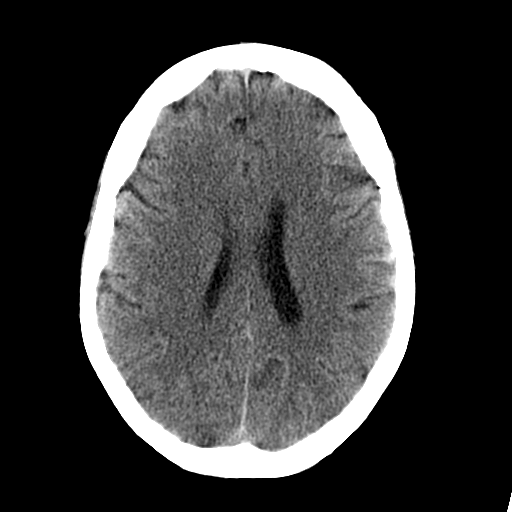
[im 19/36  bone]
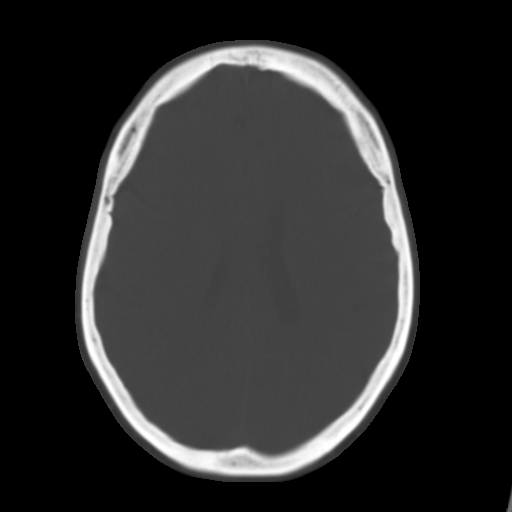
[im 21/36  brain]
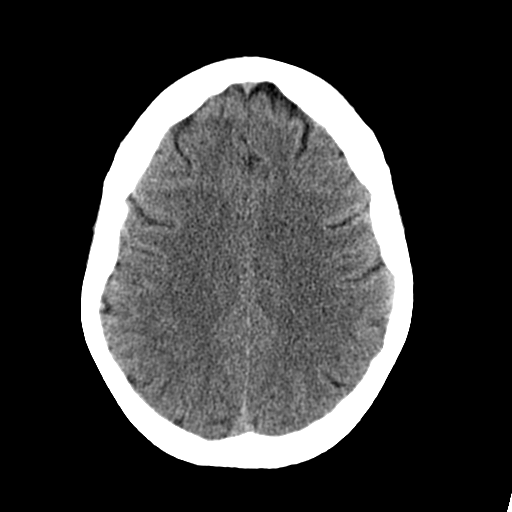
[im 24/36  brain]
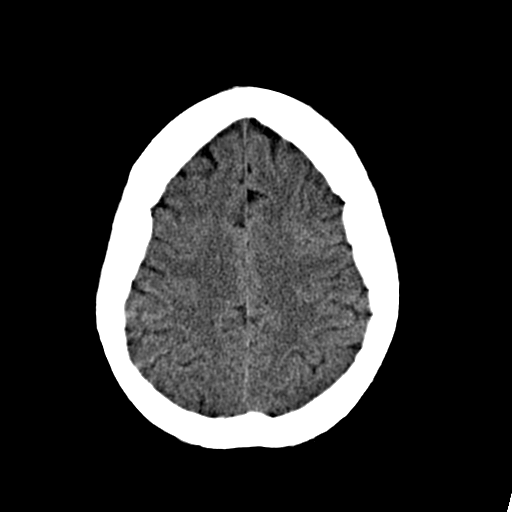
[im 26/36  brain]
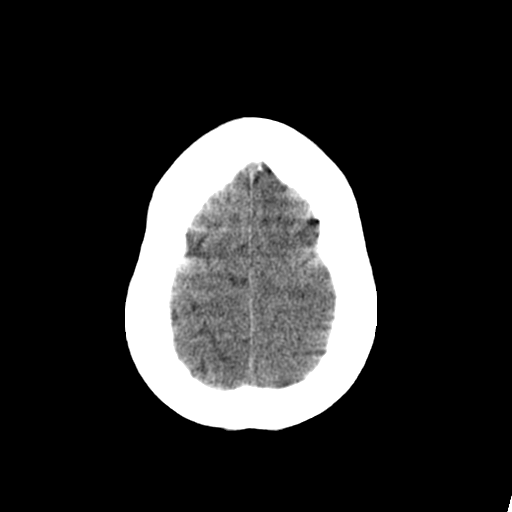
[im 27/36  brain]
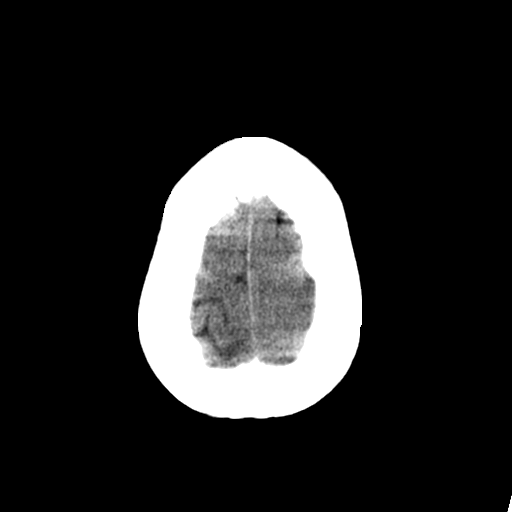
[im 27/36  bone]
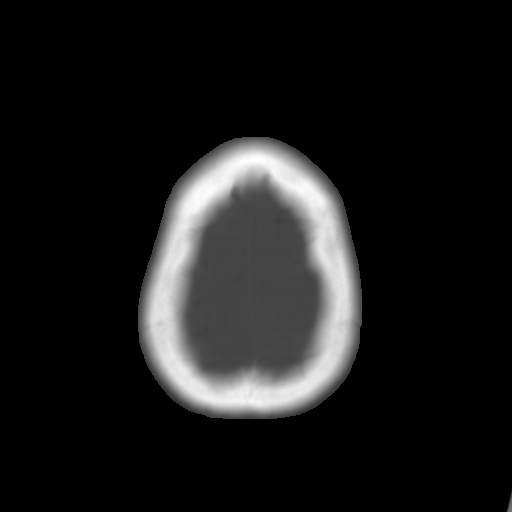
[im 29/36  brain]
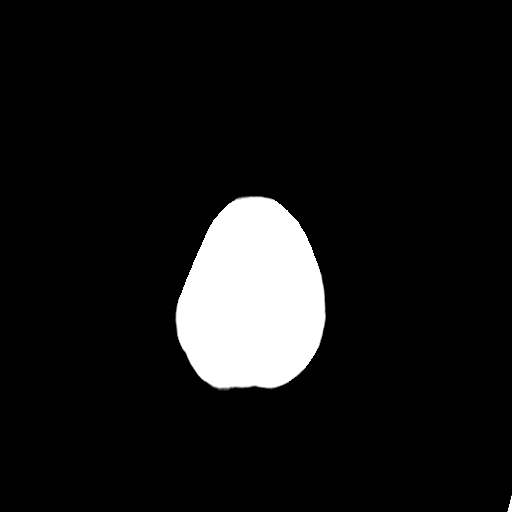
[im 32/36  brain]
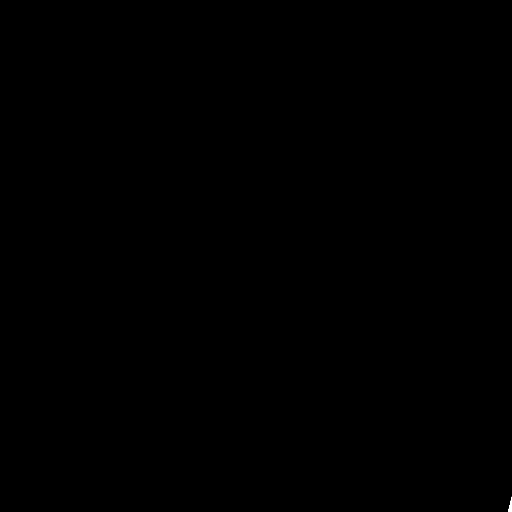
[im 34/36  brain]
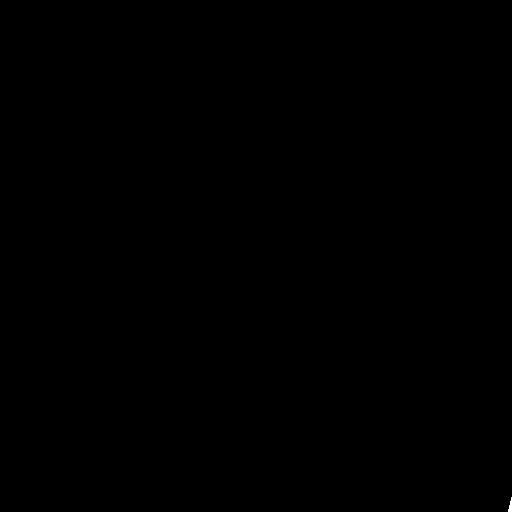

[16 of 30 positions shown; findings below may reference images not displayed]

FINDINGS: The ventricles are normal in size and configuration. No extra-axial
fluid collections are identified. The gray-white differentiation is
normal. No CT findings for acute intracranial process such as
hemorrhage or infarction. No mass lesions. The brainstem and
cerebellum are grossly normal.

The bony structures are intact. The paranasal sinuses and mastoid
air cells are clear. The globes are intact.
IMPRESSION: Normal head CT.
